# Patient Record
Sex: Male | Born: 1965
Health system: Southern US, Community
[De-identification: ages and names within clinical notes are randomized; demographics above are authoritative.]

## PROBLEM LIST (undated history)

## (undated) ENCOUNTER — Ambulatory Visit: Admission: EM | Payer: Self-pay | Source: Home / Self Care

## (undated) DIAGNOSIS — E119 Type 2 diabetes mellitus without complications: Secondary | ICD-10-CM

## (undated) DIAGNOSIS — E7800 Pure hypercholesterolemia, unspecified: Secondary | ICD-10-CM

## (undated) DIAGNOSIS — H60391 Other infective otitis externa, right ear: Secondary | ICD-10-CM

## (undated) DIAGNOSIS — N186 End stage renal disease: Secondary | ICD-10-CM

## (undated) DIAGNOSIS — R110 Nausea: Secondary | ICD-10-CM

## (undated) DIAGNOSIS — R197 Diarrhea, unspecified: Secondary | ICD-10-CM

## (undated) DIAGNOSIS — I10 Essential (primary) hypertension: Secondary | ICD-10-CM

## (undated) DIAGNOSIS — Z992 Dependence on renal dialysis: Secondary | ICD-10-CM

## (undated) DIAGNOSIS — D649 Anemia, unspecified: Secondary | ICD-10-CM

## (undated) DIAGNOSIS — E11319 Type 2 diabetes mellitus with unspecified diabetic retinopathy without macular edema: Secondary | ICD-10-CM

## (undated) HISTORY — DX: Type 2 diabetes mellitus without complications: E11.9

## (undated) HISTORY — DX: End stage renal disease: N18.6

## (undated) HISTORY — DX: Other infective otitis externa, right ear: H60.391

## (undated) HISTORY — PX: UMBILICAL HERNIA REPAIR: SHX196

---

## 2007-05-15 ENCOUNTER — Emergency Department (HOSPITAL_COMMUNITY): Admission: EM | Admit: 2007-05-15 | Discharge: 2007-05-16 | Payer: Self-pay | Admitting: Emergency Medicine

## 2011-04-28 LAB — I-STAT 8, (EC8 V) (CONVERTED LAB)
Bicarbonate: 30.5 — ABNORMAL HIGH
HCT: 54 — ABNORMAL HIGH
Hemoglobin: 18.4 — ABNORMAL HIGH
Operator id: 196461
Sodium: 134 — ABNORMAL LOW
pCO2, Ven: 47.1

## 2011-04-28 LAB — CBC
HCT: 51.1
MCHC: 35.1
RDW: 12.5
WBC: 8

## 2011-04-28 LAB — URINALYSIS, ROUTINE W REFLEX MICROSCOPIC
Bilirubin Urine: NEGATIVE
Glucose, UA: >1000 — AB
Hgb urine dipstick: NEGATIVE
Ketones, ur: NEGATIVE
Leukocytes, UA: NEGATIVE
Nitrite: NEGATIVE
Protein, ur: 30 — AB
Specific Gravity, Urine: 1.045 — ABNORMAL HIGH
pH: 6

## 2011-04-28 LAB — POCT I-STAT CREATININE
Creatinine, Ser: 0.7
Operator id: 196461

## 2011-04-28 LAB — DIFFERENTIAL
Basophils Absolute: 0
Basophils Relative: 1
Eosinophils Absolute: 0.3
Eosinophils Relative: 4
Lymphocytes Relative: 32
Monocytes Relative: 7
Neutrophils Relative %: 57

## 2011-04-28 LAB — URINE MICROSCOPIC-ADD ON

## 2015-08-03 ENCOUNTER — Inpatient Hospital Stay (HOSPITAL_COMMUNITY)
Admission: EM | Admit: 2015-08-03 | Discharge: 2015-08-06 | DRG: 305 | Disposition: A | Discharge status: Home / Self Care | Payer: Self-pay | Attending: Internal Medicine | Admitting: Internal Medicine

## 2015-08-03 ENCOUNTER — Emergency Department (HOSPITAL_COMMUNITY): Payer: Self-pay

## 2015-08-03 ENCOUNTER — Other Ambulatory Visit: Payer: Self-pay

## 2015-08-03 ENCOUNTER — Encounter (HOSPITAL_COMMUNITY): Payer: Self-pay | Admitting: Emergency Medicine

## 2015-08-03 DIAGNOSIS — Z79899 Other long term (current) drug therapy: Secondary | ICD-10-CM

## 2015-08-03 DIAGNOSIS — D72829 Elevated white blood cell count, unspecified: Secondary | ICD-10-CM | POA: Diagnosis present

## 2015-08-03 DIAGNOSIS — J40 Bronchitis, not specified as acute or chronic: Secondary | ICD-10-CM | POA: Diagnosis present

## 2015-08-03 DIAGNOSIS — E1169 Type 2 diabetes mellitus with other specified complication: Secondary | ICD-10-CM | POA: Diagnosis present

## 2015-08-03 DIAGNOSIS — R079 Chest pain, unspecified: Secondary | ICD-10-CM | POA: Diagnosis present

## 2015-08-03 DIAGNOSIS — I159 Secondary hypertension, unspecified: Secondary | ICD-10-CM

## 2015-08-03 DIAGNOSIS — I129 Hypertensive chronic kidney disease with stage 1 through stage 4 chronic kidney disease, or unspecified chronic kidney disease: Secondary | ICD-10-CM | POA: Diagnosis present

## 2015-08-03 DIAGNOSIS — N189 Chronic kidney disease, unspecified: Secondary | ICD-10-CM

## 2015-08-03 DIAGNOSIS — E11319 Type 2 diabetes mellitus with unspecified diabetic retinopathy without macular edema: Secondary | ICD-10-CM | POA: Diagnosis present

## 2015-08-03 DIAGNOSIS — E872 Acidosis: Secondary | ICD-10-CM | POA: Diagnosis present

## 2015-08-03 DIAGNOSIS — N179 Acute kidney failure, unspecified: Secondary | ICD-10-CM | POA: Diagnosis present

## 2015-08-03 DIAGNOSIS — N184 Chronic kidney disease, stage 4 (severe): Secondary | ICD-10-CM | POA: Diagnosis present

## 2015-08-03 DIAGNOSIS — I160 Hypertensive urgency: Principal | ICD-10-CM | POA: Diagnosis present

## 2015-08-03 DIAGNOSIS — J069 Acute upper respiratory infection, unspecified: Secondary | ICD-10-CM

## 2015-08-03 DIAGNOSIS — I248 Other forms of acute ischemic heart disease: Secondary | ICD-10-CM | POA: Diagnosis present

## 2015-08-03 DIAGNOSIS — I10 Essential (primary) hypertension: Secondary | ICD-10-CM | POA: Diagnosis present

## 2015-08-03 DIAGNOSIS — E7800 Pure hypercholesterolemia, unspecified: Secondary | ICD-10-CM | POA: Diagnosis present

## 2015-08-03 DIAGNOSIS — E785 Hyperlipidemia, unspecified: Secondary | ICD-10-CM | POA: Diagnosis present

## 2015-08-03 DIAGNOSIS — E118 Type 2 diabetes mellitus with unspecified complications: Secondary | ICD-10-CM

## 2015-08-03 DIAGNOSIS — I82401 Acute embolism and thrombosis of unspecified deep veins of right lower extremity: Secondary | ICD-10-CM

## 2015-08-03 DIAGNOSIS — N289 Disorder of kidney and ureter, unspecified: Secondary | ICD-10-CM

## 2015-08-03 DIAGNOSIS — E1165 Type 2 diabetes mellitus with hyperglycemia: Secondary | ICD-10-CM | POA: Diagnosis present

## 2015-08-03 DIAGNOSIS — E1122 Type 2 diabetes mellitus with diabetic chronic kidney disease: Secondary | ICD-10-CM | POA: Diagnosis present

## 2015-08-03 DIAGNOSIS — Z833 Family history of diabetes mellitus: Secondary | ICD-10-CM

## 2015-08-03 DIAGNOSIS — Z91018 Allergy to other foods: Secondary | ICD-10-CM

## 2015-08-03 DIAGNOSIS — Z8249 Family history of ischemic heart disease and other diseases of the circulatory system: Secondary | ICD-10-CM

## 2015-08-03 HISTORY — DX: Essential (primary) hypertension: I10

## 2015-08-03 HISTORY — DX: Pure hypercholesterolemia, unspecified: E78.00

## 2015-08-03 LAB — BASIC METABOLIC PANEL
Anion gap: 9 (ref 5–15)
BUN: 27 mg/dL — AB (ref 6–20)
CHLORIDE: 106 mmol/L (ref 101–111)
CO2: 24 mmol/L (ref 22–32)
CREATININE: 3.27 mg/dL — AB (ref 0.61–1.24)
Calcium: 9 mg/dL (ref 8.9–10.3)
GFR calc Af Amer: 24 mL/min — ABNORMAL LOW (ref >60)
GFR calc non Af Amer: 21 mL/min — ABNORMAL LOW (ref >60)
GLUCOSE: 114 mg/dL — AB (ref 65–99)
Potassium: 4.6 mmol/L (ref 3.5–5.1)
Sodium: 139 mmol/L (ref 135–145)

## 2015-08-03 LAB — URINALYSIS, ROUTINE W REFLEX MICROSCOPIC
BILIRUBIN URINE: NEGATIVE
GLUCOSE, UA: 250 mg/dL — AB
KETONES UR: NEGATIVE mg/dL
Leukocytes, UA: NEGATIVE
Nitrite: NEGATIVE
Specific Gravity, Urine: 1.02 (ref 1.005–1.030)
pH: 6 (ref 5.0–8.0)

## 2015-08-03 LAB — CBC
HEMATOCRIT: 33.4 % — AB (ref 39.0–52.0)
HEMOGLOBIN: 11.8 g/dL — AB (ref 13.0–17.0)
MCH: 29.3 pg (ref 26.0–34.0)
MCHC: 35.3 g/dL (ref 30.0–36.0)
MCV: 82.9 fL (ref 78.0–100.0)
Platelets: 170 10*3/uL (ref 150–400)
RBC: 4.03 MIL/uL — ABNORMAL LOW (ref 4.22–5.81)
RDW: 12.5 % (ref 11.5–15.5)
WBC: 14.1 10*3/uL — ABNORMAL HIGH (ref 4.0–10.5)

## 2015-08-03 LAB — CBG MONITORING, ED: Glucose-Capillary: 100 mg/dL — ABNORMAL HIGH (ref 65–99)

## 2015-08-03 LAB — I-STAT CG4 LACTIC ACID, ED
LACTIC ACID, VENOUS: 0.98 mmol/L (ref 0.5–2.0)
LACTIC ACID, VENOUS: 1.02 mmol/L (ref 0.5–2.0)

## 2015-08-03 LAB — URINE MICROSCOPIC-ADD ON

## 2015-08-03 LAB — I-STAT TROPONIN, ED: Troponin i, poc: 0.01 ng/mL (ref 0.00–0.08)

## 2015-08-03 MED ORDER — PIPERACILLIN-TAZOBACTAM 3.375 G IVPB 30 MIN
3.3750 g | Freq: Once | INTRAVENOUS | Status: AC
  Administered 2015-08-03: 3.375 g via INTRAVENOUS
  Filled 2015-08-03: qty 50

## 2015-08-03 MED ORDER — VANCOMYCIN HCL IN DEXTROSE 750-5 MG/150ML-% IV SOLN
750.0000 mg | Freq: Once | INTRAVENOUS | Status: AC
  Administered 2015-08-03: 750 mg via INTRAVENOUS
  Filled 2015-08-03: qty 150

## 2015-08-03 MED ORDER — LABETALOL HCL 5 MG/ML IV SOLN
10.0000 mg | Freq: Once | INTRAVENOUS | Status: AC
  Administered 2015-08-03: 10 mg via INTRAVENOUS
  Filled 2015-08-03: qty 4

## 2015-08-03 MED ORDER — PIPERACILLIN-TAZOBACTAM 3.375 G IVPB
3.3750 g | Freq: Three times a day (TID) | INTRAVENOUS | Status: DC
  Administered 2015-08-04 (×2): 3.375 g via INTRAVENOUS
  Filled 2015-08-03 (×4): qty 50

## 2015-08-03 MED ORDER — LORAZEPAM 2 MG/ML IJ SOLN
0.5000 mg | Freq: Once | INTRAMUSCULAR | Status: AC
  Administered 2015-08-03: 0.5 mg via INTRAVENOUS
  Filled 2015-08-03: qty 1

## 2015-08-03 MED ORDER — SODIUM CHLORIDE 0.9 % IV BOLUS (SEPSIS)
1000.0000 mL | Freq: Once | INTRAVENOUS | Status: AC
  Administered 2015-08-03: 1000 mL via INTRAVENOUS

## 2015-08-03 MED ORDER — VANCOMYCIN HCL IN DEXTROSE 1-5 GM/200ML-% IV SOLN
1000.0000 mg | Freq: Once | INTRAVENOUS | Status: AC
  Administered 2015-08-03: 1000 mg via INTRAVENOUS
  Filled 2015-08-03: qty 200

## 2015-08-03 MED ORDER — ACETAMINOPHEN 500 MG PO TABS
1000.0000 mg | ORAL_TABLET | Freq: Once | ORAL | Status: AC
  Administered 2015-08-03: 1000 mg via ORAL
  Filled 2015-08-03: qty 2

## 2015-08-03 MED ORDER — IBUPROFEN 200 MG PO TABS
ORAL_TABLET | ORAL | Status: AC
  Administered 2015-08-03: 20:00:00
  Filled 2015-08-03: qty 3

## 2015-08-03 MED ORDER — IBUPROFEN 400 MG PO TABS
600.0000 mg | ORAL_TABLET | Freq: Once | ORAL | Status: AC
  Administered 2015-08-03: 600 mg via ORAL

## 2015-08-03 NOTE — Progress Notes (Signed)
ANTIBIOTIC CONSULT NOTE - INITIAL  Pharmacy Consult for Vancomycin and Zosyn Indication: rule out sepsis  Allergies  Allergen Reactions  . Pork-Derived Products Rash and Other (See Comments)    Raises blood pressure    Patient Measurements:   Adjusted Body Weight:   Vital Signs: Temp: 100.9 F (38.3 C) (01/15 2000) Temp Source: Oral (01/15 2000) BP: 218/122 mmHg (01/15 1945) Pulse Rate: 114 (01/15 1945) Intake/Output from previous day:   Intake/Output from this shift:    Labs:  Recent Labs  08/03/15 1639  WBC 14.1*  HGB 11.8*  PLT 170  CREATININE 3.27*   CrCl cannot be calculated (Unknown ideal weight.). No results for input(s): VANCOTROUGH, VANCOPEAK, VANCORANDOM, GENTTROUGH, GENTPEAK, GENTRANDOM, TOBRATROUGH, TOBRAPEAK, TOBRARND, AMIKACINPEAK, AMIKACINTROU, AMIKACIN in the last 72 hours.   Microbiology: No results found for this or any previous visit (from the past 720 hour(s)).  Medical History: Past Medical History  Diagnosis Date  . Diabetes mellitus without complication ([place])   . Hypertension   . High cholesterol     Medications:   (Not in a hospital admission) Scheduled:   Infusions:  . piperacillin-tazobactam    . sodium chloride    . sodium chloride    . vancomycin     Assessment: 50yo male presents with HA, chills, SOB, nasal congestion and cough. Pharmacy is consulted to dose vancomycin and zosyn for suspected sepsis. Pt is febrile to 100.9, WBC 14.1, sCr 3.27.  Goal of Therapy:  Vancomycin trough level 15-20 mcg/ml  Plan:  Vancomycin 1g IV once in the ED Zosyn 3.375g IV once in the ED Measure antibiotic drug levels at steady state Follow up culture results, renal function and clinical course  Eric Glenn. [name], PharmD, [facility] Clinical Pharmacist Pager [phone] 08/03/2015,8:14 PM

## 2015-08-03 NOTE — Progress Notes (Signed)
ANTIBIOTIC CONSULT NOTE   Pharmacy Consult for Vancomycin and Zosyn Indication: rule out sepsis  Allergies  Allergen Reactions  . Pork-Derived Products Rash and Other (See Comments)    Raises blood pressure    Patient Measurements: Weight: 202 lb 8 oz (91.853 kg) Adjusted Body Weight:   Vital Signs: Temp: 100.9 F (38.3 C) (01/15 2000) Temp Source: Oral (01/15 2000) BP: 218/122 mmHg (01/15 1945) Pulse Rate: 114 (01/15 1945) Intake/Output from previous day:   Intake/Output from this shift:    Labs:  Recent Labs  08/03/15 1639  WBC 14.1*  HGB 11.8*  PLT 170  CREATININE 3.27*   CrCl cannot be calculated (Unknown ideal weight.). No results for input(s): VANCOTROUGH, VANCOPEAK, VANCORANDOM, GENTTROUGH, GENTPEAK, GENTRANDOM, TOBRATROUGH, TOBRAPEAK, TOBRARND, AMIKACINPEAK, AMIKACINTROU, AMIKACIN in the last 72 hours.   Microbiology: No results found for this or any previous visit (from the past 720 hour(s)).  Medical History: Past Medical History  Diagnosis Date  . Diabetes mellitus without complication ([place])   . Hypertension   . High cholesterol     Medications:   (Not in a hospital admission) Scheduled:  . labetalol  10 mg Intravenous Once  . LORazepam  0.5 mg Intravenous Once   Infusions:  . piperacillin-tazobactam    . sodium chloride 1,000 mL (08/03/15 2032)  . sodium chloride 1,000 mL (08/03/15 2031)  . vancomycin      Assessment: 50yo male presents with HA, chills, SOB, nasal congestion and cough. Pharmacy is consulted to dose vancomycin and zosyn for suspected sepsis. Pt is febrile to 100.9, WBC 14.1, sCr 3.27 (Last SCr was 0.7 on 2008) . Estimated CrCl ~ 30 but may not be reliable.Annell Greening noted 91.9kg  1/15 zosyn 1/15 vanc  1/15 urine 1/15 blood x2  Goal of Therapy:  Vancomycin trough level 15-20 mcg/ml  Plan:  -Vancomycin 750mg  IV (for total on 1750mg ) -Will follow BMET in am for vancomycin maintenance dosing -Zosyn 3.375gm IV  q8h -Will follow renal function, cultures and clinical progress  Hildred Laser, Pharm D 08/03/2015 8:41 PM

## 2015-08-03 NOTE — ED Provider Notes (Signed)
CSN: [id number]     Arrival date & time 08/03/15  1551 History   First MD Initiated Contact with Patient 08/03/15 2010     Chief Complaint  Patient presents with  . Shortness of Breath  . Chills  . Cough  . Nasal Congestion     (Consider location/radiation/quality/duration/timing/severity/associated sxs/prior Treatment) HPI  Spanish speaking only, interview done with an interpretor Patient to the ER with complaints of nasal congestion, cough, chills, and cold symptoms for 5 days. He says that today he developed worsening symptoms and chills and also becomes fatigue and SOB with walking. He has a bilateral temporal headache that is 3/10 and throbbing. In triage he is hypertensive at 232/117, temp of 100.3, tachycardic at 107. O2 is 98 % on room air. He has a PMH of diabetes, hypertension and high cholesterol. He takes Enalapril and Metformin but is not compliant with his medications. He also is complaining of some tightness in his chest earlier but has since improved.+ nausea  He has not had orthopnea, lower extremity swelling, wheezing, back pain, rash, neck pain, confusion, change in vision, focal weakness, vomiting, diarrhea. Denies abnormal bleeding or change in bowel habits or urination.      Past Medical History  Diagnosis Date  . Diabetes mellitus without complication ([place])   . Hypertension   . High cholesterol    History reviewed. No pertinent past surgical history. No family history on file. Social History  Substance Use Topics  . Smoking status: Never Smoker   . Smokeless tobacco: None  . Alcohol Use: No    Review of Systems  Review of Systems All other systems negative except as documented in the HPI. All pertinent positives and negatives as reviewed in the HPI.   Allergies  Pork-derived products  Home Medications   Prior to Admission medications   Medication Sig Start Date End Date Taking? Authorizing Provider  Multiple Vitamin (MULTIVITAMIN WITH MINERALS)  TABS tablet Take 1 tablet by mouth daily.   Yes Historical Provider, MD  OVER THE COUNTER MEDICATION Place 1 spray into both nostrils daily as needed (congestion). Over the counter nasal spray   Yes Historical Provider, MD  PRESCRIPTION MEDICATION Take 1 tablet by mouth daily. Medication for blood sugar from [place]   Yes Historical Provider, MD  PRESCRIPTION MEDICATION Take 1 tablet by mouth daily. Medication for blood pressure from [place]   Yes Historical Provider, MD   BP 162/90 mmHg  Pulse 95  Temp(Src) 100.9 F (38.3 C) (Oral)  Resp 17  Wt 91.853 kg  SpO2 99% Physical Exam  Constitutional: He is oriented to person, place, and time. He appears well-developed and well-nourished. He appears ill. No distress.  HENT:  Head: Normocephalic and atraumatic.  Right Ear: Tympanic membrane and ear canal normal.  Left Ear: Tympanic membrane and ear canal normal.  Nose: Rhinorrhea: nasal congestion. Right sinus exhibits no frontal sinus tenderness. Left sinus exhibits no frontal sinus tenderness.  Mouth/Throat: Uvula is midline, oropharynx is clear and moist and mucous membranes are normal. No oropharyngeal exudate, posterior oropharyngeal edema or posterior oropharyngeal erythema.  Eyes: EOM and lids are normal. Pupils are equal, round, and reactive to light. Right conjunctiva is injected. Left conjunctiva is injected.  + pterygium bilaterally  Neck: Trachea normal and normal range of motion. Neck supple. No spinous process tenderness and no muscular tenderness present. No Brudzinski's sign and no Kernig's sign noted.  Cardiovascular: Regular rhythm.  Tachycardia present.   Pulmonary/Chest: Effort normal. No accessory muscle  usage. No respiratory distress. He has decreased breath sounds. He has no rhonchi.  Abdominal: Soft. Bowel sounds are normal. There is no tenderness. There is no rebound and no guarding.  No signs of abdominal distention  Musculoskeletal:  No LE swelling  Neurological: He is  alert and oriented to person, place, and time.  Acting at baseline  Skin: Skin is warm and dry. No rash noted.  Psychiatric: His mood appears anxious.  Nursing note and vitals reviewed.   ED Course  Procedures (including critical care time) Labs Review Labs Reviewed  BASIC METABOLIC PANEL - Abnormal; Notable for the following:    Glucose, Bld 114 (*)    BUN 27 (*)    Creatinine, Ser 3.27 (*)    GFR calc non Af Amer 21 (*)    GFR calc Af Amer 24 (*)    All other components within normal limits  CBC - Abnormal; Notable for the following:    WBC 14.1 (*)    RBC 4.03 (*)    Hemoglobin 11.8 (*)    HCT 33.4 (*)    All other components within normal limits  URINALYSIS, ROUTINE W REFLEX MICROSCOPIC (NOT AT [facility]) - Abnormal; Notable for the following:    Glucose, UA 250 (*)    Hgb urine dipstick MODERATE (*)    Protein, ur >300 (*)    All other components within normal limits  URINE MICROSCOPIC-ADD ON - Abnormal; Notable for the following:    Squamous Epithelial / LPF 0-5 (*)    Bacteria, UA RARE (*)    Casts HYALINE CASTS (*)    All other components within normal limits  CBG MONITORING, ED - Abnormal; Notable for the following:    Glucose-Capillary 100 (*)    All other components within normal limits  CULTURE, BLOOD (ROUTINE X 2)  CULTURE, BLOOD (ROUTINE X 2)  URINE CULTURE  BASIC METABOLIC PANEL  I-STAT TROPOININ, ED  I-STAT CG4 LACTIC ACID, ED  I-STAT CG4 LACTIC ACID, ED    Imaging Review Dg Chest 2 View  08/03/2015  CLINICAL DATA:  Chest pain with nasal congestion and cough for 3 weeks EXAM: CHEST  2 VIEW COMPARISON:  May 15, 2007 FINDINGS: There is no edema or consolidation. The heart size and pulmonary vascularity are normal. No adenopathy. No bone lesions. No pneumothorax. IMPRESSION: No edema or consolidation. Electronically Signed   By: Lowella Grip III M.D.   On: 08/03/2015 17:47   I have personally reviewed and evaluated these images and lab results as  part of my medical decision-making.   EKG Interpretation None      MDM   Final diagnoses:  Secondary hypertension, unspecified  Acute renal insufficiency  Upper respiratory infection   CRITICAL CARE Performed by: Linus Mako Total critical care time: 35 minutes Critical care time was exclusive of separately billable procedures and treating other patients. Critical care was necessary to treat or prevent imminent or life-threatening deterioration. Critical care was time spent personally by me on the following activities: development of treatment plan with patient and/or surrogate as well as nursing, discussions with consultants, evaluation of patient's response to treatment, examination of patient, obtaining history from patient or surrogate, ordering and performing treatments and interventions, ordering and review of laboratory studies, ordering and review of radiographic studies, pulse oximetry and re-evaluation of patient's condition.  Code Sepsis Called at 8:09 pm  Unclear patient presentation. He presents with temperature 101.4, tachycardia at 120, blood pressure 230/120 with 100% pulse ox on  room air. He complains of headache, negative head CT Positive chest pain cough and cold symptoms, negative troponin, nonacute EKG and chest x-ray unremarkable. He has a negative lactic acid.  At this point I feel like the patient is not septic. He was given IV labetalol which improved his blood pressure 160/90, his headache and chest pressure went away not long after the medicine per the patient. Since he has not had a medical evaluate in the past 10 years and is non compliant it is unclear if this is an acute elevation of his BP or acute kidney injury  He is currently stable and will be admitted to [facility], inpatient, [facility] Admits, Dr. Olevia Bowens.    Delos Haring, PA-C 08/03/15 [id number]  Gareth Morgan, MD 08/04/15 1451

## 2015-08-03 NOTE — ED Notes (Signed)
C/o headache, chills, sob, nasal congestion, and non-productive cough x 3 weeks.  Reports soreness across chest x 2 hours.

## 2015-08-04 ENCOUNTER — Inpatient Hospital Stay (HOSPITAL_COMMUNITY): Payer: Self-pay

## 2015-08-04 ENCOUNTER — Ambulatory Visit (HOSPITAL_COMMUNITY): Payer: Self-pay

## 2015-08-04 ENCOUNTER — Other Ambulatory Visit: Payer: Self-pay

## 2015-08-04 ENCOUNTER — Encounter (HOSPITAL_COMMUNITY): Payer: Self-pay | Admitting: Internal Medicine

## 2015-08-04 DIAGNOSIS — E118 Type 2 diabetes mellitus with unspecified complications: Secondary | ICD-10-CM

## 2015-08-04 DIAGNOSIS — E1165 Type 2 diabetes mellitus with hyperglycemia: Secondary | ICD-10-CM | POA: Diagnosis present

## 2015-08-04 DIAGNOSIS — E1169 Type 2 diabetes mellitus with other specified complication: Secondary | ICD-10-CM | POA: Diagnosis present

## 2015-08-04 DIAGNOSIS — I10 Essential (primary) hypertension: Secondary | ICD-10-CM | POA: Diagnosis present

## 2015-08-04 DIAGNOSIS — I82401 Acute embolism and thrombosis of unspecified deep veins of right lower extremity: Secondary | ICD-10-CM

## 2015-08-04 DIAGNOSIS — R079 Chest pain, unspecified: Secondary | ICD-10-CM

## 2015-08-04 DIAGNOSIS — E119 Type 2 diabetes mellitus without complications: Secondary | ICD-10-CM

## 2015-08-04 DIAGNOSIS — D72829 Elevated white blood cell count, unspecified: Secondary | ICD-10-CM | POA: Diagnosis present

## 2015-08-04 DIAGNOSIS — E11319 Type 2 diabetes mellitus with unspecified diabetic retinopathy without macular edema: Secondary | ICD-10-CM | POA: Diagnosis present

## 2015-08-04 HISTORY — DX: Type 2 diabetes mellitus without complications: E11.9

## 2015-08-04 LAB — BASIC METABOLIC PANEL
Anion gap: 6 (ref 5–15)
BUN: 26 mg/dL — ABNORMAL HIGH (ref 6–20)
CALCIUM: 7.7 mg/dL — AB (ref 8.9–10.3)
CO2: 20 mmol/L — ABNORMAL LOW (ref 22–32)
CREATININE: 3.29 mg/dL — AB (ref 0.61–1.24)
Chloride: 112 mmol/L — ABNORMAL HIGH (ref 101–111)
GFR calc non Af Amer: 20 mL/min — ABNORMAL LOW (ref >60)
GFR, EST AFRICAN AMERICAN: 24 mL/min — AB (ref >60)
Glucose, Bld: 118 mg/dL — ABNORMAL HIGH (ref 65–99)
Potassium: 4.5 mmol/L (ref 3.5–5.1)
SODIUM: 138 mmol/L (ref 135–145)

## 2015-08-04 LAB — GLUCOSE, CAPILLARY
GLUCOSE-CAPILLARY: 115 mg/dL — AB (ref 65–99)
GLUCOSE-CAPILLARY: 144 mg/dL — AB (ref 65–99)
Glucose-Capillary: 120 mg/dL — ABNORMAL HIGH (ref 65–99)
Glucose-Capillary: 135 mg/dL — ABNORMAL HIGH (ref 65–99)

## 2015-08-04 LAB — COMPREHENSIVE METABOLIC PANEL
ALBUMIN: 2.3 g/dL — AB (ref 3.5–5.0)
ALK PHOS: 65 U/L (ref 38–126)
ALT: 20 U/L (ref 17–63)
AST: 22 U/L (ref 15–41)
Anion gap: 7 (ref 5–15)
BILIRUBIN TOTAL: 0.7 mg/dL (ref 0.3–1.2)
BUN: 27 mg/dL — AB (ref 6–20)
CO2: 20 mmol/L — ABNORMAL LOW (ref 22–32)
Calcium: 8.1 mg/dL — ABNORMAL LOW (ref 8.9–10.3)
Chloride: 110 mmol/L (ref 101–111)
Creatinine, Ser: 3.24 mg/dL — ABNORMAL HIGH (ref 0.61–1.24)
GFR calc Af Amer: 24 mL/min — ABNORMAL LOW (ref >60)
GFR calc non Af Amer: 21 mL/min — ABNORMAL LOW (ref >60)
GLUCOSE: 142 mg/dL — AB (ref 65–99)
POTASSIUM: 4.5 mmol/L (ref 3.5–5.1)
Sodium: 137 mmol/L (ref 135–145)
TOTAL PROTEIN: 5.1 g/dL — AB (ref 6.5–8.1)

## 2015-08-04 LAB — LIPID PANEL
CHOLESTEROL: 162 mg/dL (ref 0–200)
HDL: 25 mg/dL — ABNORMAL LOW (ref >40)
LDL Cholesterol: UNDETERMINED mg/dL (ref 0–99)
TRIGLYCERIDES: 414 mg/dL — AB (ref <150)
Total CHOL/HDL Ratio: 6.5 RATIO
VLDL: UNDETERMINED mg/dL (ref 0–40)

## 2015-08-04 LAB — URINE CULTURE

## 2015-08-04 LAB — POTASSIUM, URINE RANDOM: Potassium Urine: 36 mmol/L

## 2015-08-04 LAB — VANCOMYCIN, TROUGH: Vancomycin Tr: 17 ug/mL (ref 10.0–20.0)

## 2015-08-04 LAB — TROPONIN I
Troponin I: 0.1 ng/mL — ABNORMAL HIGH (ref <0.031)
Troponin I: 0.11 ng/mL — ABNORMAL HIGH (ref <0.031)
Troponin I: 0.1 ng/mL — ABNORMAL HIGH (ref <0.031)

## 2015-08-04 LAB — CREATININE, URINE, RANDOM: CREATININE, URINE: 56.52 mg/dL

## 2015-08-04 LAB — INFLUENZA PANEL BY PCR (TYPE A & B)
H1N1FLUPCR: NOT DETECTED
INFLAPCR: NEGATIVE
Influenza B By PCR: NEGATIVE

## 2015-08-04 LAB — MAGNESIUM: Magnesium: 1.7 mg/dL (ref 1.7–2.4)

## 2015-08-04 LAB — PHOSPHORUS: Phosphorus: 4.3 mg/dL (ref 2.5–4.6)

## 2015-08-04 LAB — SODIUM, URINE, RANDOM: Sodium, Ur: 105 mmol/L

## 2015-08-04 LAB — PROTEIN, URINE, RANDOM: Total Protein, Urine: 526 mg/dL

## 2015-08-04 MED ORDER — AMLODIPINE BESYLATE 5 MG PO TABS
5.0000 mg | ORAL_TABLET | Freq: Every day | ORAL | Status: DC
  Administered 2015-08-04: 5 mg via ORAL
  Filled 2015-08-04: qty 1

## 2015-08-04 MED ORDER — AMOXICILLIN-POT CLAVULANATE 875-125 MG PO TABS
1.0000 | ORAL_TABLET | Freq: Two times a day (BID) | ORAL | Status: DC
  Administered 2015-08-04: 1 via ORAL
  Filled 2015-08-04: qty 1

## 2015-08-04 MED ORDER — FLUTICASONE PROPIONATE 50 MCG/ACT NA SUSP
1.0000 | Freq: Every day | NASAL | Status: DC
  Administered 2015-08-04 – 2015-08-06 (×3): 1 via NASAL
  Filled 2015-08-04: qty 16

## 2015-08-04 MED ORDER — AMLODIPINE BESYLATE 10 MG PO TABS
10.0000 mg | ORAL_TABLET | Freq: Every day | ORAL | Status: DC
  Administered 2015-08-05 – 2015-08-06 (×2): 10 mg via ORAL
  Filled 2015-08-04 (×2): qty 1

## 2015-08-04 MED ORDER — ACETAMINOPHEN 325 MG PO TABS
650.0000 mg | ORAL_TABLET | Freq: Four times a day (QID) | ORAL | Status: DC | PRN
  Administered 2015-08-04 (×2): 650 mg via ORAL
  Filled 2015-08-04 (×2): qty 2

## 2015-08-04 MED ORDER — HYDRALAZINE HCL 20 MG/ML IJ SOLN
5.0000 mg | Freq: Once | INTRAMUSCULAR | Status: AC
  Administered 2015-08-04: 5 mg via INTRAVENOUS

## 2015-08-04 MED ORDER — ONDANSETRON HCL 4 MG/2ML IJ SOLN: 4.0000 mg | Freq: Four times a day (QID) | INTRAMUSCULAR | Status: DC | PRN

## 2015-08-04 MED ORDER — METOPROLOL TARTRATE 25 MG PO TABS
25.0000 mg | ORAL_TABLET | Freq: Two times a day (BID) | ORAL | Status: DC
  Administered 2015-08-04 (×2): 25 mg via ORAL
  Filled 2015-08-04 (×2): qty 1

## 2015-08-04 MED ORDER — SODIUM CHLORIDE 0.9 % IV SOLN
INTRAVENOUS | Status: DC
  Administered 2015-08-04: 17:00:00 via INTRAVENOUS
  Administered 2015-08-05: 1000 mL via INTRAVENOUS
  Administered 2015-08-05: 04:00:00 via INTRAVENOUS

## 2015-08-04 MED ORDER — INSULIN ASPART 100 UNIT/ML ~~LOC~~ SOLN
0.0000 [IU] | Freq: Three times a day (TID) | SUBCUTANEOUS | Status: DC
  Administered 2015-08-05 (×3): 1 [IU] via SUBCUTANEOUS

## 2015-08-04 MED ORDER — AMOXICILLIN-POT CLAVULANATE 500-125 MG PO TABS
1.0000 | ORAL_TABLET | Freq: Two times a day (BID) | ORAL | Status: DC
  Administered 2015-08-05 – 2015-08-06 (×3): 500 mg via ORAL
  Filled 2015-08-04 (×4): qty 1

## 2015-08-04 MED ORDER — AMLODIPINE BESYLATE 5 MG PO TABS
5.0000 mg | ORAL_TABLET | Freq: Once | ORAL | Status: AC
  Administered 2015-08-04: 5 mg via ORAL
  Filled 2015-08-04: qty 1

## 2015-08-04 MED ORDER — PNEUMOCOCCAL VAC POLYVALENT 25 MCG/0.5ML IJ INJ
0.5000 mL | INJECTION | INTRAMUSCULAR | Status: AC
  Administered 2015-08-05: 0.5 mL via INTRAMUSCULAR
  Filled 2015-08-04: qty 0.5

## 2015-08-04 MED ORDER — INFLUENZA VAC SPLIT QUAD 0.5 ML IM SUSY
0.5000 mL | PREFILLED_SYRINGE | INTRAMUSCULAR | Status: AC
  Administered 2015-08-05: 0.5 mL via INTRAMUSCULAR
  Filled 2015-08-04: qty 0.5

## 2015-08-04 MED ORDER — HYDRALAZINE HCL 20 MG/ML IJ SOLN
INTRAMUSCULAR | Status: AC
  Administered 2015-08-04: 5 mg via INTRAVENOUS
  Filled 2015-08-04: qty 1

## 2015-08-04 MED ORDER — HYDRALAZINE HCL 20 MG/ML IJ SOLN
5.0000 mg | INTRAMUSCULAR | Status: DC | PRN
  Administered 2015-08-04 (×2): 5 mg via INTRAVENOUS
  Filled 2015-08-04 (×2): qty 1

## 2015-08-04 MED ORDER — SODIUM CHLORIDE 0.9 % IJ SOLN
3.0000 mL | Freq: Two times a day (BID) | INTRAMUSCULAR | Status: DC
  Administered 2015-08-05: 3 mL via INTRAVENOUS

## 2015-08-04 NOTE — ED Notes (Signed)
Attempted report X1

## 2015-08-04 NOTE — Progress Notes (Addendum)
Pt arrived to 3W06 with family at bedside.  Pt c/o headache with BP 204/100. MD paged for further orders.  Awaiting return call.

## 2015-08-04 NOTE — H&P (Signed)
[facility] History and Physical  Eric Glenn [id number] DOB: 12-28-1965 DOA: 08/03/2015  Referring physician: Delos Haring, PA-C PCP: No primary care provider on file.   Chief Complaint: Chest pain.  HPI: Eric Glenn is a 50 y.o. male  with a past medical history of type 2 diabetes, hypertension who comes to the emergency department due to chest pressure for 2 hours and headache, chills, dyspnea nasal congestion and nonproductive cough for the past 2 weeks.  The patient is not a very good historian and is assisted by his relatives while narrating his symptoms. Apparently about 2 weeks ago he started developing cold-like symptoms, including sinus headache, nasal congestion, nonproductive cough, chills and fatigue. He is states that he becomes dyspneic with ambulation, but today feels like his headache is worse and developed earlier chest pressure, not radiated, associated with dyspnea, mild dizziness, mild nausea, but no emesis.   He denies orthopnea PND lower extremities edema, but on exam has 2+ lower extremity edema. He is states that he has not visited a doctor in about 8 or 9 years and he gets his medications on the mail from [place] [facility] [facility] by his relatives. He has been following with ophthalmology for laser treatments for what seems to be diabetic retinopathy.  In the ED, the patient was found to have a blood pressure of 232/117 mmHg, heart rate of 107 bpm and a temperature of 100.15F. He has received IV antibiotics, labetalol and lorazepam. He states that he feels better.   Review of Systems:  Constitutional:  Positive Fevers, chills, fatigue.  No weight loss, night sweats. HEENT:  Positive headache, nasal congestion, sore throat, nonproductive cough. The patient has been using over-the-counter nasal decongestant for about 3 months. Cardio-vascular:  As above mentioned. GI:  Mild nausea, loss of appetite No heartburn, indigestion, abdominal  pain, vomiting, diarrhea, change in bowel habits,  Resp:  Positive dyspnea, nonproductive cough. Denies wheezing or hemoptysis.  Skin:  no rash or lesions.  GU:  no dysuria, change in color of urine, no urgency or frequency. No flank pain.  Musculoskeletal:  No joint pain or swelling. No decreased range of motion. No back pain.  Psych:  No change in mood or affect. No depression or anxiety. No memory loss.   Past Medical History  Diagnosis Date  . Diabetes mellitus without complication ([place])   . Hypertension   . High cholesterol    Past Surgical History  Procedure Laterality Date  . Umbilical hernia repair     Social History:  reports that he has never smoked. He does not have any smokeless tobacco history on file. He reports that he does not drink alcohol or use illicit drugs.  Allergies  Allergen Reactions  . Pork-Derived Products Rash and Other (See Comments)    Raises blood pressure    Family History  Problem Relation Age of Onset  . Hypertension Mother   . Hyperlipidemia Mother   . Diabetes Mellitus II Sister     Prior to Admission medications   Medication Sig Start Date End Date Taking? Authorizing Provider  Multiple Vitamin (MULTIVITAMIN WITH MINERALS) TABS tablet Take 1 tablet by mouth daily.   Yes Historical Provider, MD  OVER THE COUNTER MEDICATION Place 1 spray into both nostrils daily as needed (congestion). Over the counter nasal spray   Yes Historical Provider, MD  PRESCRIPTION MEDICATION Take 1 tablet by mouth daily. Medication for blood sugar from [place]   Yes Historical Provider, MD  PRESCRIPTION MEDICATION Take 1 tablet  by mouth daily. Medication for blood pressure from [place]   Yes Historical Provider, MD   Physical Exam: Filed Vitals:   08/03/15 2145 08/03/15 2200 08/03/15 2215 08/03/15 2315  BP: 170/102 152/98 139/75 162/90  Pulse: 102 99 95 95  Temp:      TempSrc:      Resp: 17 16 16 17   Weight:      SpO2: 98% 93% 93% 99%    Wt Readings  from Last 3 Encounters:  08/03/15 91.853 kg (202 lb 8 oz)    General:  Appears calm and comfortable Eyes: PERRL, normal lids, irises. Positive keratoconjunctivitis laterally ENT: grossly normal hearing, lips & tongue Neck: no LAD, masses or thyromegaly Cardiovascular: RRR, no m/r/g. 2+ LE edema. Telemetry: SR, no arrhythmias  Respiratory: Decreased breath sounds on bases bilaterally, no w/r/r.                       Decreased respiratory effort. No tachypnea. No accessory muscle use. Abdomen: Obese, soft, ntnd Skin: no rash or induration seen on limited exam Musculoskeletal: grossly normal tone BUE/BLE Psychiatric: grossly normal mood and affect, speech fluent and appropriate Neurologic: Awake, alert, oriented 3, grossly non-focal.          Labs on Admission:  Basic Metabolic Panel:  Recent Labs Lab 08/03/15 1639  NA 139  K 4.6  CL 106  CO2 24  GLUCOSE 114*  BUN 27*  CREATININE 3.27*  CALCIUM 9.0   CBC:  Recent Labs Lab 08/03/15 1639  WBC 14.1*  HGB 11.8*  HCT 33.4*  MCV 82.9  PLT 170    CBG:  Recent Labs Lab 08/03/15 2034  GLUCAP 100*    Radiological Exams on Admission: Dg Chest 2 View  08/03/2015  CLINICAL DATA:  Chest pain with nasal congestion and cough for 3 weeks EXAM: CHEST  2 VIEW COMPARISON:  May 15, 2007 FINDINGS: There is no edema or consolidation. The heart size and pulmonary vascularity are normal. No adenopathy. No bone lesions. No pneumothorax. IMPRESSION: No edema or consolidation. Electronically Signed   By: Lowella Grip III M.D.   On: 08/03/2015 17:47   Ct Head Wo Contrast  08/03/2015  CLINICAL DATA:  50 year old male with headaches and hypertension. EXAM: CT HEAD WITHOUT CONTRAST TECHNIQUE: Contiguous axial images were obtained from the base of the skull through the vertex without intravenous contrast. COMPARISON:  None. FINDINGS: The ventricles and sulci are appropriate in size for patient's age. Minimal periventricular and deep  white matter hypodensities represent chronic microvascular ischemic changes. There is no intracranial hemorrhage. No mass effect or midline shift identified. The visualized paranasal sinuses and mastoid air cells are well aerated. The calvarium is intact. IMPRESSION: No acute intracranial pathology. Electronically Signed   By: Anner Crete M.D.   On: 08/03/2015 23:23    EKG: Independently reviewed. Vent. rate 109 BPM PR interval 152 ms QRS duration 94 ms QT/QTc 336/452 ms P-R-T axes 53 -20 14 Sinus tachycardia Possible Anterior infarct , age undetermined Abnormal ECG No previous tracing to compare to.  Assessment/Plan Principal Problem:   Chest pain Admit to telemetry. Trend cardiac biomarkers. Start metoprolol 25 mg by mouth twice a day. Abnormal EKG. Check echocardiogram to evaluate EF.  Active Problems:    Hypertensive urgency Start metoprolol 25 mg by mouth twice a day. Hold enalapril due to CKD and lack of knowledge of current dosage. Monitor blood pressure periodically and adjust antihypertensive therapy as needed.    Acute  versus chronic renal insufficiency Unknown for how long the patient has had chronic renal insufficiency,  since his last visit to the doctor and labs were in 2008.  Hold metformin. Check urine random electrolytes, creatinine and protein. Check renal ultrasound.     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    Proteinuria The patient states that he has been seeing occasionally foamy urine for some time now. He does not know if he has had proteinuria or not in the past.    Leukocytosis, tachycardia and low-grade fever Unknown source at this time, but it could be sinusitis. Continue IV antibiotics. Follow WBC.   Code Status: Full code. DVT Prophylaxis: SCDs. 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 Family Communication: Disposition Plan: Admit to telemetry, cardiac biomarkers trending, echo and further evaluation.  Time spent: Over 70 minutes were spent in the process of his admission.  Reubin Milan [facility] Pager [phone].

## 2015-08-04 NOTE — Clinical Documentation Improvement (Signed)
Internal Medicine    Documented Sepsis in H + P, not in current progress notes.  Was diagnosis ruled in or ruled out?  Thank you     Sepsis ruled in  Sepsis ruled out  Other  Clinically Undetermined    Please exercise your independent, professional judgment when responding. A specific answer is not anticipated or expected.   Thank You, [place] [phone]

## 2015-08-04 NOTE — Progress Notes (Signed)
VASCULAR LAB PRELIMINARY  PRELIMINARY  PRELIMINARY  PRELIMINARY   Bilateral- lower extremity venous Doppler has been completed. No DVT identified ,no superficial thrombus or no baker's cyst - Bilaterally  Twisha Vanpelt, RVT, RDMS 08/04/2015, 4:10 PM

## 2015-08-04 NOTE — Progress Notes (Addendum)
[facility] Brief Progress Note  Chart reviewed.  History and physical exam confirmed on my exam.  One addition is that he reports right leg swelling for the past 1-2 months with pain with ambulation.  This is worse with standing on his feet.  Bilateral pterygium, medial.    Chest pain improved, he reports that his main concern is his acute illness.  He notes that he has had sinus pain and pressure mainly on the left, cough, runny nose.  He has an elevated WBC.  BP has remained high despite metoprolol.  He was not aware of any renal function problems, but he has not seen any physician.   Plan  Chest pain Trend enzymes - 0.01 --> 0.10 --> 0.11; this rise has stabilized.  EKG from yesterday with TWI in AVF and III, V6.  Will repeat Continue to trend enzymes, rise could be explained by Creatinine and acute illness along with elevated BP Continue IVF  Accelerated HTN Start amlodipine 5mg  daily now, increase as needed PRN hydralazine for SBP > 180  Acute infection CXR not revealing, UA normal I do not think he needs such broad spectrum coverage, change to Augmentin for possible sinus infection.    AKI Urine studies pending Renal ultrasound without obstruction IVF with NS at 125cc/hr, decrease to 75cc/hr if he is eating well.   DM2 Glucose running in the 110s-140s Continue sliding scale for now Check A1C  Right lower extremity swelling Check ultrasound of the leg, concern for possible clot  Gilles Chiquito, MD [phone]

## 2015-08-04 NOTE — Progress Notes (Signed)
Pt given tylenol 650 mg for c/o headache and prn hydralazine 5 mg given per new MD orders.  Pt on droplet precautions and awaiting flu swab from lab.  Report given to Marya Amsler, Therapist, sports.

## 2015-08-04 NOTE — Clinical Documentation Improvement (Signed)
Internal Medicine  Can the diagnosis of CKD be further specified with the stage? Thank you    CKD Stage I - GFR greater than or equal to 90  CKD Stage II - GFR 60-89  CKD Stage III - GFR 30-59  CKD Stage IV - GFR 15-29  CKD Stage V - GFR < 15  Other condition  Unable to clinically determine   Supporting Information: : (risk factors, signs and symptoms, diagnostics, treatment) HTN urgency , AKI, DM 2  Please exercise your independent, professional judgment when responding. A specific answer is not anticipated or expected.   Thank You, [place] [phone]

## 2015-08-04 NOTE — Progress Notes (Signed)
  Echocardiogram 2D Echocardiogram has been performed.  Jennette Dubin 08/04/2015, 12:00 PM

## 2015-08-05 DIAGNOSIS — E785 Hyperlipidemia, unspecified: Secondary | ICD-10-CM

## 2015-08-05 DIAGNOSIS — I160 Hypertensive urgency: Principal | ICD-10-CM

## 2015-08-05 DIAGNOSIS — D72829 Elevated white blood cell count, unspecified: Secondary | ICD-10-CM

## 2015-08-05 LAB — RENAL FUNCTION PANEL
ANION GAP: 7 (ref 5–15)
Albumin: 2.2 g/dL — ABNORMAL LOW (ref 3.5–5.0)
BUN: 29 mg/dL — ABNORMAL HIGH (ref 6–20)
CHLORIDE: 109 mmol/L (ref 101–111)
CO2: 21 mmol/L — AB (ref 22–32)
Calcium: 8.1 mg/dL — ABNORMAL LOW (ref 8.9–10.3)
Creatinine, Ser: 3.52 mg/dL — ABNORMAL HIGH (ref 0.61–1.24)
GFR calc non Af Amer: 19 mL/min — ABNORMAL LOW (ref >60)
GFR, EST AFRICAN AMERICAN: 22 mL/min — AB (ref >60)
GLUCOSE: 155 mg/dL — AB (ref 65–99)
POTASSIUM: 4.4 mmol/L (ref 3.5–5.1)
Phosphorus: 5 mg/dL — ABNORMAL HIGH (ref 2.5–4.6)
SODIUM: 137 mmol/L (ref 135–145)

## 2015-08-05 LAB — CBC
HCT: 28 % — ABNORMAL LOW (ref 39.0–52.0)
Hemoglobin: 9.9 g/dL — ABNORMAL LOW (ref 13.0–17.0)
MCH: 29.7 pg (ref 26.0–34.0)
MCHC: 35.4 g/dL (ref 30.0–36.0)
MCV: 84.1 fL (ref 78.0–100.0)
PLATELETS: 143 10*3/uL — AB (ref 150–400)
RBC: 3.33 MIL/uL — ABNORMAL LOW (ref 4.22–5.81)
RDW: 12.9 % (ref 11.5–15.5)
WBC: 10.4 10*3/uL (ref 4.0–10.5)

## 2015-08-05 LAB — HEMOGLOBIN A1C
HEMOGLOBIN A1C: 7.5 % — AB (ref 4.8–5.6)
MEAN PLASMA GLUCOSE: 169 mg/dL

## 2015-08-05 LAB — BASIC METABOLIC PANEL
ANION GAP: 6 (ref 5–15)
BUN: 28 mg/dL — AB (ref 6–20)
CALCIUM: 8 mg/dL — AB (ref 8.9–10.3)
CO2: 20 mmol/L — ABNORMAL LOW (ref 22–32)
CREATININE: 3.4 mg/dL — AB (ref 0.61–1.24)
Chloride: 110 mmol/L (ref 101–111)
GFR calc Af Amer: 23 mL/min — ABNORMAL LOW (ref >60)
GFR, EST NON AFRICAN AMERICAN: 20 mL/min — AB (ref >60)
GLUCOSE: 137 mg/dL — AB (ref 65–99)
Potassium: 3.9 mmol/L (ref 3.5–5.1)
Sodium: 136 mmol/L (ref 135–145)

## 2015-08-05 LAB — MICROALBUMIN / CREATININE URINE RATIO
CREATININE, UR: 47.6 mg/dL
MICROALB/CREAT RATIO: 6083.6 mg/g{creat} — AB (ref 0.0–30.0)
Microalb, Ur: 2895.8 ug/mL — ABNORMAL HIGH

## 2015-08-05 LAB — GLUCOSE, CAPILLARY
GLUCOSE-CAPILLARY: 159 mg/dL — AB (ref 65–99)
Glucose-Capillary: 143 mg/dL — ABNORMAL HIGH (ref 65–99)
Glucose-Capillary: 133 mg/dL — ABNORMAL HIGH (ref 65–99)
Glucose-Capillary: 131 mg/dL — ABNORMAL HIGH (ref 65–99)

## 2015-08-05 LAB — CHLORIDE, URINE, RANDOM: Chloride Urine: 97 mmol/L

## 2015-08-05 MED ORDER — CARVEDILOL 25 MG PO TABS
25.0000 mg | ORAL_TABLET | Freq: Two times a day (BID) | ORAL | Status: DC
  Administered 2015-08-05 – 2015-08-06 (×3): 25 mg via ORAL
  Filled 2015-08-05 (×3): qty 1

## 2015-08-05 MED ORDER — OMEGA-3-ACID ETHYL ESTERS 1 G PO CAPS
1.0000 g | ORAL_CAPSULE | Freq: Two times a day (BID) | ORAL | Status: DC
  Administered 2015-08-05 – 2015-08-06 (×3): 1 g via ORAL
  Filled 2015-08-05 (×3): qty 1

## 2015-08-05 MED ORDER — SODIUM BICARBONATE 650 MG PO TABS
650.0000 mg | ORAL_TABLET | Freq: Two times a day (BID) | ORAL | Status: DC
  Administered 2015-08-05 – 2015-08-06 (×3): 650 mg via ORAL
  Filled 2015-08-05 (×3): qty 1

## 2015-08-05 MED ORDER — LORATADINE 10 MG PO TABS
10.0000 mg | ORAL_TABLET | Freq: Every day | ORAL | Status: DC
  Administered 2015-08-05 – 2015-08-06 (×2): 10 mg via ORAL
  Filled 2015-08-05 (×2): qty 1

## 2015-08-05 MED ORDER — PRAVASTATIN SODIUM 40 MG PO TABS
40.0000 mg | ORAL_TABLET | Freq: Every day | ORAL | Status: DC
  Administered 2015-08-05: 40 mg via ORAL
  Filled 2015-08-05: qty 1

## 2015-08-05 NOTE — Progress Notes (Signed)
[facility] PROGRESS NOTE  Eric Glenn [id number] DOB: 06/08/1966 DOA: 08/03/2015 PCP: No primary care provider on file.  Assessment/Plan: 1-chest pain:  -mild elevation of troponin, most likely due to renal dysfunction and demand ischemia from hypertensive urgency -on abnormalities on EKG -2-D echo with reassurance -will continue baby ASA, coreg and statins -patient currently w/o further CP  2-hypertensive urgency -controlled now -BP still elevated, but better -will continue amlodipine and coreg -follow VS and adjust medications further   3-uncontrolled diabetes: Type 2, no on insulin  -will follow A1C -continue SSI -intermittently using metformin (use to received medication from [place]) -will avoid metformin given renal function -glipizide and insulin at discharge -needs close follow up as an outpatient  4-CKD stage 4: -unknown baseline -base on GFR, will estimate stage 4 -no UTI on UA -renal US w/o hydronephrosis or uropathy -will avoid nephrotoxic agents -needs close follow up with PCP, control of BP/diabetes and outpatient follow up with nephrology  5-metabolic acidosis  -mild -most likely due to renal failure  -will use bicarb BID  6-HLD: will use pravachol and omega 3  7-sinusitis/bronchitis: -sepsis r/o -continue augmentin (plan is to treat for 7 days) -will add loratadine and continue flonase -influenza by PCR neg  Code Status: Full Family Communication: sister at bedside  Disposition Plan: home in 24/48 hours; waiting pending results for adjustments in his discharge meds and been afebrile.   Consultants:  None   Procedures:  2-D echo: - Left ventricle: The cavity size was normal. Wall thickness was increased in a pattern of mild LVH. Systolic function was normal. The estimated ejection fraction was in the range of 50% to 55%. Wall motion was normal; there were no regional wall motion abnormalities. Left ventricular  diastolic function parameters were normal. - Aortic valve: There was mild regurgitation. - Right atrium: The atrium was mildly dilated.   LE duplex: no DVT, SVT or baker cyst   See below for x-ray reports   Antibiotics:  Augmentin 1/16>>  HPI/Subjective: Low grade temp, no CP and reports breathing ok. Still with runny nose and sneezing.  Objective: Filed Vitals:   08/05/15 0800 08/05/15 1000  BP: 151/91 106/68  Pulse:    Temp:    Resp:      Intake/Output Summary (Last 24 hours) at 08/05/15 1330 Last data filed at 08/05/15 0900  Gross per 24 hour  Intake 1761.25 ml  Output   1510 ml  Net 251.25 ml   Filed Weights   08/03/15 2032 08/04/15 0626 08/05/15 0600  Weight: 91.853 kg (202 lb 8 oz) 94.348 kg (208 lb) 91.491 kg (201 lb 11.2 oz)    Exam:   General:  Low grade temp; no CP, positive runny nose. Denies nausea and vomiting.  Cardiovascular: S1 and S2, no rubs or gallops  Respiratory: scattered rhonchi, no wheezing; no crackles  Abdomen: soft, NT, ND, positive BS  Musculoskeletal: trace to 1 + edema bilaterally, no cyanosis   Data Reviewed: Basic Metabolic Panel:  Recent Labs Lab 08/03/15 1639 08/04/15 0408 08/04/15 0651 08/04/15 0801 08/05/15 0610  NA 139 138  --  137 136  K 4.6 4.5  --  4.5 3.9  CL 106 112*  --  110 110  CO2 24 20*  --  20* 20*  GLUCOSE 114* 118*  --  142* 137*  BUN 27* 26*  --  27* 28*  CREATININE 3.27* 3.29*  --  3.24* 3.40*  CALCIUM 9.0 7.7*  --  8.1* 8.0*  MG  --   --  1.7  --   --   PHOS  --   --  4.3  --   --    Liver Function Tests:  Recent Labs Lab 08/04/15 0801  AST 22  ALT 20  ALKPHOS 65  BILITOT 0.7  PROT 5.1*  ALBUMIN 2.3*   CBC:  Recent Labs Lab 08/03/15 1639 08/05/15 0610  WBC 14.1* 10.4  HGB 11.8* 9.9*  HCT 33.4* 28.0*  MCV 82.9 84.1  PLT 170 143*   Cardiac Enzymes:  Recent Labs Lab 08/04/15 0801 08/04/15 1352 08/04/15 2029  TROPONINI 0.10* 0.11* 0.10*   CBG:  Recent  Labs Lab 08/04/15 1132 08/04/15 1645 08/04/15 2126 08/05/15 0746 08/05/15 1145  GLUCAP 144* 120* 135* 143* 133*    Recent Results (from the past 240 hour(s))  Blood Culture (routine x 2)     Status: None (Preliminary result)   Collection Time: 08/03/15  7:55 PM  Result Value Ref Range Status   Specimen Description BLOOD RIGHT FOREARM  Final   Special Requests BOTTLES DRAWN AEROBIC AND ANAEROBIC 5CC   Final   Culture NO GROWTH 2 DAYS  Final   Report Status PENDING  Incomplete  Blood Culture (routine x 2)     Status: None (Preliminary result)   Collection Time: 08/03/15  8:30 PM  Result Value Ref Range Status   Specimen Description BLOOD RIGHT ANTECUBITAL  Final   Special Requests BOTTLES DRAWN AEROBIC AND ANAEROBIC 5CC   Final   Culture NO GROWTH 2 DAYS  Final   Report Status PENDING  Incomplete  Urine culture     Status: None   Collection Time: 08/03/15  8:35 PM  Result Value Ref Range Status   Specimen Description URINE, CLEAN CATCH  Final   Special Requests NONE  Final   Culture 4,000 COLONIES/mL INSIGNIFICANT GROWTH  Final   Report Status 08/04/2015 FINAL  Final     Studies: Dg Chest 2 View  08/03/2015  CLINICAL DATA:  Chest pain with nasal congestion and cough for 3 weeks EXAM: CHEST  2 VIEW COMPARISON:  May 15, 2007 FINDINGS: There is no edema or consolidation. The heart size and pulmonary vascularity are normal. No adenopathy. No bone lesions. No pneumothorax. IMPRESSION: No edema or consolidation. Electronically Signed   By: Lowella Grip III M.D.   On: 08/03/2015 17:47   Ct Head Wo Contrast  08/03/2015  CLINICAL DATA:  50 year old male with headaches and hypertension. EXAM: CT HEAD WITHOUT CONTRAST TECHNIQUE: Contiguous axial images were obtained from the base of the skull through the vertex without intravenous contrast. COMPARISON:  None. FINDINGS: The ventricles and sulci are appropriate in size for patient's age. Minimal periventricular and deep white matter  hypodensities represent chronic microvascular ischemic changes. There is no intracranial hemorrhage. No mass effect or midline shift identified. The visualized paranasal sinuses and mastoid air cells are well aerated. The calvarium is intact. IMPRESSION: No acute intracranial pathology. Electronically Signed   By: Anner Crete M.D.   On: 08/03/2015 23:23   US Renal  08/04/2015  CLINICAL DATA:  Chronic kidney disease EXAM: RENAL / URINARY TRACT ULTRASOUND COMPLETE COMPARISON:  None. FINDINGS: Right Kidney: Length: 14.6 cm. Echogenicity within normal limits. No mass or hydronephrosis visualized. Left Kidney: Length: 13.9 cm. Echogenicity within normal limits. There is a 1.5 x 1.1 x 1.3 cm anechoic left renal mass most consistent with a cyst. No hydronephrosis visualized. Bladder: Appears normal for degree of bladder distention. IMPRESSION: 1. No obstructive uropathy. Electronically Signed  By: Kathreen Devoid   On: 08/04/2015 12:06    Scheduled Meds: . amLODipine  10 mg Oral Daily  . amoxicillin-clavulanate  1 tablet Oral BID  . carvedilol  25 mg Oral BID WC  . fluticasone  1 spray Each Nare Daily  . insulin aspart  0-9 Units Subcutaneous TID WC  . loratadine  10 mg Oral Daily  . omega-3 acid ethyl esters  1 g Oral BID  . sodium bicarbonate  650 mg Oral BID  . sodium chloride  3 mL Intravenous Q12H   Continuous Infusions: . sodium chloride 75 mL/hr at 08/05/15 0425    Principal Problem:   Chest pain Active Problems:   Acute renal insufficiency   Hypertensive urgency   DM type 2, uncontrolled, with renal complications ([place])   Diabetic retinopathy associated with type 2 diabetes mellitus ([place])   Proteinuria   Leukocytosis    Time spent: 35 minutes    Barton Dubois  [facility] Pager [phone]. If 7PM-7AM, please contact night-coverage at [email or web address], password [facility] 08/05/2015, 1:30 PM  LOS: 2 days

## 2015-08-05 NOTE — Care Management Note (Signed)
Case Management Note  Patient Details  Name: Eric Glenn MRN: [id number] Date of Birth: February 26, 1966  Subjective/Objective:  Pt admitted for chest pain. No insurance or PCP @ this rime.   Action/Plan: CM did provide pt with the information to the [facility] for patient to call for Appointment. Pt will need generic Rx's at d/c so he can utilize [facility] $4.00 list. No further needs from CM at this time.    Expected Discharge Date:                  Expected Discharge Plan:  Home/Self Care  In-House Referral:  NA  Discharge planning Services  CM Consult, [facility] ([place] provided. in [place])  Post Acute Care Choice:  NA Choice offered to:  NA  DME Arranged:  N/A DME Agency:  NA  HH Arranged:  NA HH Agency:  NA  Status of Service:  Completed, signed off  Medicare Important Message Given:    Date Medicare IM Given:    Medicare IM give by:    Date Additional Medicare IM Given:    Additional Medicare Important Message give by:     If discussed at [place] of Stay Meetings, dates discussed:    Additional Comments:  Bethena Roys, RN 08/05/2015, 12:56 PM

## 2015-08-06 DIAGNOSIS — E1122 Type 2 diabetes mellitus with diabetic chronic kidney disease: Secondary | ICD-10-CM

## 2015-08-06 DIAGNOSIS — N289 Disorder of kidney and ureter, unspecified: Secondary | ICD-10-CM

## 2015-08-06 DIAGNOSIS — N184 Chronic kidney disease, stage 4 (severe): Secondary | ICD-10-CM

## 2015-08-06 DIAGNOSIS — R079 Chest pain, unspecified: Secondary | ICD-10-CM

## 2015-08-06 DIAGNOSIS — E1165 Type 2 diabetes mellitus with hyperglycemia: Secondary | ICD-10-CM

## 2015-08-06 LAB — GLUCOSE, CAPILLARY
GLUCOSE-CAPILLARY: 105 mg/dL — AB (ref 65–99)
Glucose-Capillary: 153 mg/dL — ABNORMAL HIGH (ref 65–99)

## 2015-08-06 LAB — BASIC METABOLIC PANEL
ANION GAP: 4 — AB (ref 5–15)
BUN: 29 mg/dL — ABNORMAL HIGH (ref 6–20)
CO2: 22 mmol/L (ref 22–32)
Calcium: 8 mg/dL — ABNORMAL LOW (ref 8.9–10.3)
Chloride: 110 mmol/L (ref 101–111)
Creatinine, Ser: 3.24 mg/dL — ABNORMAL HIGH (ref 0.61–1.24)
GFR, EST AFRICAN AMERICAN: 24 mL/min — AB (ref >60)
GFR, EST NON AFRICAN AMERICAN: 21 mL/min — AB (ref >60)
GLUCOSE: 178 mg/dL — AB (ref 65–99)
POTASSIUM: 4.1 mmol/L (ref 3.5–5.1)
Sodium: 136 mmol/L (ref 135–145)

## 2015-08-06 LAB — CBC
HCT: 27.3 % — ABNORMAL LOW (ref 39.0–52.0)
HEMOGLOBIN: 9.5 g/dL — AB (ref 13.0–17.0)
MCH: 29.6 pg (ref 26.0–34.0)
MCHC: 34.8 g/dL (ref 30.0–36.0)
MCV: 85 fL (ref 78.0–100.0)
Platelets: 136 10*3/uL — ABNORMAL LOW (ref 150–400)
RBC: 3.21 MIL/uL — ABNORMAL LOW (ref 4.22–5.81)
RDW: 12.8 % (ref 11.5–15.5)
WBC: 7.6 10*3/uL (ref 4.0–10.5)

## 2015-08-06 LAB — HEMOGLOBIN A1C
HEMOGLOBIN A1C: 7.6 % — AB (ref 4.8–5.6)
Mean Plasma Glucose: 171 mg/dL

## 2015-08-06 MED ORDER — PRAVASTATIN SODIUM 40 MG PO TABS
40.0000 mg | ORAL_TABLET | Freq: Every day | ORAL | Status: DC
Start: 2015-08-06 — End: 2015-08-19

## 2015-08-06 MED ORDER — GLYBURIDE-METFORMIN 2.5-500 MG PO TABS: 1.0000 | ORAL_TABLET | Freq: Two times a day (BID) | ORAL | Status: DC

## 2015-08-06 MED ORDER — FLUTICASONE PROPIONATE 50 MCG/ACT NA SUSP: 1.0000 | Freq: Every day | NASAL | Status: DC

## 2015-08-06 MED ORDER — CARVEDILOL 25 MG PO TABS: 25.0000 mg | ORAL_TABLET | Freq: Two times a day (BID) | ORAL | Status: DC

## 2015-08-06 MED ORDER — LORATADINE 10 MG PO TABS: 10.0000 mg | ORAL_TABLET | Freq: Every day | ORAL | Status: DC

## 2015-08-06 MED ORDER — AMOXICILLIN-POT CLAVULANATE 500-125 MG PO TABS: 1.0000 | ORAL_TABLET | Freq: Two times a day (BID) | ORAL | Status: DC

## 2015-08-06 MED ORDER — OMEGA-3-ACID ETHYL ESTERS 1 G PO CAPS: 1.0000 g | ORAL_CAPSULE | Freq: Two times a day (BID) | ORAL | Status: DC

## 2015-08-06 MED ORDER — AMLODIPINE BESYLATE 10 MG PO TABS: 10.0000 mg | ORAL_TABLET | Freq: Every day | ORAL | Status: DC

## 2015-08-06 NOTE — Discharge Instructions (Signed)
Hipertensin (Hypertension) La hipertensin, conocida comnmente como presin arterial alta, se produce cuando la sangre bombea en las arterias con mucha fuerza. Las arterias son los vasos sanguneos que transportan la sangre desde el corazn hacia todas las partes del cuerpo. Una lectura de la presin arterial consiste en un nmero ms alto sobre un nmero ms bajo, por ejemplo, 110/72. El nmero ms alto (presin sistlica) corresponde a la presin interna de las arterias cuando el corazn bombea sangre. El nmero ms bajo (presin diastlica) corresponde a la presin interna de las arterias cuando el corazn se relaja. En condiciones ideales, la presin arterial debe ser inferior a 120/80. La hipertensin fuerza al corazn a trabajar ms para bombear la sangre. Las arterias pueden estrecharse o ponerse rgidas. La hipertensin no tratada o no controlada puede causar infarto de miocardio, ictus, enfermedad renal y otros problemas. FACTORES DE RIESGO Algunos factores de riesgo de hipertensin son controlables, pero otros no lo son.  Entre los factores de riesgo que usted no puede controlar, se incluyen los siguientes:   La raza. El riesgo es mayor para las personas afroamericanas.  La edad. Los riesgos aumentan con la edad.  El sexo. Antes de los 45aos, los hombres corren ms riesgo que las mujeres. Despus de los 65aos, las mujeres corren ms riesgo que los hombres. Entre los factores de riesgo que usted puede controlar, se incluyen los siguientes:  No hacer la cantidad suficiente de actividad fsica o ejercicio.  Tener sobrepeso.  Consumir mucha grasa, azcar, caloras o sal en la dieta.  Beber alcohol en exceso. SIGNOS Y SNTOMAS Por lo general, la hipertensin no causa signos o sntomas. La hipertensin arterial demasiado alta (crisis hipertensiva) puede causar dolor de cabeza, ansiedad, falta de aire y hemorragia nasal. DIAGNSTICO Para detectar si usted tiene hipertensin, el  mdico le medir la presin arterial mientras est sentado, con el brazo levantado a la altura del corazn. Debe medirla al menos dos veces en el mismo brazo. Determinadas condiciones pueden causar una diferencia de presin arterial entre el brazo izquierdo y el derecho. El hecho de tener una sola lectura de la presin arterial ms alta que lo normal no significa que necesita un tratamiento. Si no est claro si tiene hipertensin arterial, es posible que se le pida que regrese otro da para volver a controlarle la presin arterial. O bien se le puede pedir que se controle la presin arterial en su casa durante 1 o ms meses. TRATAMIENTO El tratamiento de la hipertensin arterial incluye hacer cambios en el estilo de vida y, posiblemente, tomar medicamentos. Un estilo de vida saludable puede ayudar a bajar la presin arterial alta. Quiz deba cambiar algunos hbitos. Los cambios en el estilo de vida pueden incluir lo siguiente:  Seguir la dieta DASH. Esta dieta tiene un alto contenido de frutas, verduras y cereales integrales. Incluye poca cantidad de sal, carnes rojas y azcares agregados.  Mantenga el consumo de sodio por debajo de 2300 mg por da.  Realizar al menos entre 30 y 45 minutos de ejercicio aerbico, 4 veces por semana como mnimo.  Perder peso, si es necesario.  No fumar.  Limitar el consumo de bebidas alcohlicas.  Aprender formas de reducir el estrs. El mdico puede recetarle medicamentos si los cambios en el estilo de vida no son suficientes para lograr controlar la presin arterial y si una de las siguientes afirmaciones es verdadera:  Tiene entre 18 y 59 aos y su presin arterial sistlica est por encima de 140.  Tiene   60 aos o ms y su presin arterial sistlica est por encima de 150.  Su presin arterial diastlica est por encima de 90.  Tiene diabetes y su presin arterial sistlica est por encima de 140 o su presin arterial diastlica est por encima de  90.  Tiene una enfermedad renal y su presin arterial est por encima de 140/90.  Tiene una enfermedad cardaca y su presin arterial est por encima de 140/90. La presin arterial deseada puede variar en funcin de las enfermedades, la edad y otros factores personales. INSTRUCCIONES PARA EL CUIDADO EN EL HOGAR  Haga que le midan de nuevo la presin arterial segn las indicaciones del mdico.  Tome los medicamentos solamente como se lo haya indicado el mdico. Siga cuidadosamente las indicaciones. Los medicamentos para la presin arterial deben tomarse segn las indicaciones. Los medicamentos pierden eficacia al omitir las dosis. El hecho de omitir las dosis tambin aumenta el riesgo de otros problemas.  No fume.  Contrlese la presin arterial en su casa segn las indicaciones del mdico. SOLICITE ATENCIN MDICA SI:   Piensa que tiene una reaccin alrgica a los medicamentos.  Tiene mareos o dolores de cabeza con recurrencia.  Tiene hinchazn en los tobillos.  Tiene problemas de visin. SOLICITE ATENCIN MDICA DE INMEDIATO SI:  Siente un dolor de cabeza intenso o confusin.  Siente debilidad inusual, adormecimiento o que se desmayar.  Siente dolor intenso en el pecho o en el abdomen.  Vomita repetidas veces.  Tiene dificultad para respirar. ASEGRESE DE QUE:   Comprende estas instrucciones.  Controlar su afeccin.  Recibir ayuda de inmediato si no mejora o si empeora.   Esta informacin no tiene como fin reemplazar el consejo del mdico. Asegrese de hacerle al mdico cualquier pregunta que tenga.   Document Released: 07/05/2005 Document Revised: 11/19/2014 Elsevier Interactive Patient Education 2016 Elsevier Inc.  

## 2015-08-06 NOTE — Discharge Summary (Signed)
Physician Discharge Summary  Eric Glenn [id number] DOB: 06/14/1966 DOA: 08/03/2015  PCP: No primary care provider on file.  Admit date: 08/03/2015 Discharge date: 08/06/2015  Time spent: 30 minutes  Recommendations for Outpatient Follow-up:  1. Follow up with PCP in one week.  2. Follow up with nephrologist as soon as possible.    Discharge Diagnoses:  Principal Problem:   Chest pain Active Problems:   Acute renal insufficiency   Hypertensive urgency   DM type 2, uncontrolled, with renal complications (HCC)   Diabetic retinopathy associated with type 2 diabetes mellitus (HCC)   Proteinuria   Leukocytosis   Discharge Condition: improved  Diet recommendation: carb modified diet.   Filed Weights   08/04/15 0626 08/05/15 0600 08/06/15 0500  Weight: 94.348 kg (208 lb) 91.491 kg (201 lb 11.2 oz) 92.715 kg (204 lb 6.4 oz)    History of present illness:  Eric Glenn is a 50 y.o. male with a past medical history of type 2 diabetes, hypertension who comes to the emergency department due to chest pressure for 2 hours and headache, chills, dyspnea nasal congestion and nonproductive cough for the past 2 weeks.  Hospital Course:  1-chest pain:  -mild elevation of troponin, most likely due to renal dysfunction and demand ischemia from hypertensive urgency. Resolved.  -no abnormalities on EKG -2-D echo with reassurance -will continue baby ASA, coreg and statins -patient currently w/o further CP  2-hypertensive urgency -controlled now - resume norvasc and coreg.   3-uncontrolled diabetes: Type 2, no on insulin  hgba1c is 7.6 Resume home meds on discharge.   4-CKD stage 4: -unknown baseline -base on GFR, will estimate stage 4 -no UTI on UA -renal US w/o hydronephrosis or uropathy -will avoid nephrotoxic agents -needs close follow up with PCP, control of BP/diabetes and outpatient follow up with nephrology - outpatient follow up with [facility].   5-metabolic acidosis  -mild -most likely due to renal failure  Resolved.   6-HLD: will use pravachol and omega 3  7-sinusitis/bronchitis: -sepsis r/o -continue augmentin to complete the course.  -will add loratadine and continue flonase -influenza by PCR neg   Procedures:  US renal  Echo    Consultations:  none  Discharge Exam: Filed Vitals:   08/06/15 0500 08/06/15 1305  BP: 165/85 133/76  Pulse: 66 65  Temp: 98.3 F (36.8 C) 97.8 F (36.6 C)  Resp: 18 18    General: alert afebrile comfortable. Cardiovascular: s1s2 Respiratory: ctab  Discharge Instructions   Discharge Instructions    Diet - low sodium heart healthy    Complete by:  As directed      Diet Carb Modified    Complete by:  As directed      Discharge instructions    Complete by:  As directed   PLEASE FOLLOW UP WITH KIDNEY PHYSICIAN AS RECOMMENDED IN 1 TO2 WEEKS.  PLEASE FOLLOW UP WITH PCP in 1 to 2 weeks.          Current Discharge Medication List    START taking these medications   Details  amLODipine (NORVASC) 10 MG tablet Take 1 tablet (10 mg total) by mouth daily. Qty: 30 tablet, Refills: 0    amoxicillin-clavulanate (AUGMENTIN) 500-125 MG tablet Take 1 tablet (500 mg total) by mouth 2 (two) times daily. Qty: 10 tablet, Refills: 0    carvedilol (COREG) 25 MG tablet Take 1 tablet (25 mg total) by mouth 2 (two) times daily with a meal. Qty: 60 tablet, Refills:  0    fluticasone (FLONASE) 50 MCG/ACT nasal spray Place 1 spray into both nostrils daily. Qty: 16 g, Refills: 2    loratadine (CLARITIN) 10 MG tablet Take 1 tablet (10 mg total) by mouth daily. Qty: 10 tablet, Refills: 0    omega-3 acid ethyl esters (LOVAZA) 1 g capsule Take 1 capsule (1 g total) by mouth 2 (two) times daily. Qty: 60 capsule, Refills: 1    pravastatin (PRAVACHOL) 40 MG tablet Take 1 tablet (40 mg total) by mouth daily at 6 PM. Qty: 30 tablet, Refills: 1      CONTINUE these  medications which have CHANGED   Details  glyBURIDE-metformin (GLUCOVANCE) 2.5-500 MG tablet Take 1 tablet by mouth 2 (two) times daily with a meal. Qty: 60 tablet, Refills: 0      CONTINUE these medications which have NOT CHANGED   Details  Multiple Vitamin (MULTIVITAMIN WITH MINERALS) TABS tablet Take 1 tablet by mouth daily.    OVER THE COUNTER MEDICATION Place 1 spray into both nostrils daily as needed (congestion). Over the counter nasal spray      STOP taking these medications     enalapril (VASOTEC) 10 MG tablet        Allergies  Allergen Reactions  . Pork-Derived Products Rash and Other (See Comments)    Raises blood pressure   Follow-up Information    Follow up with [facility].   Contact information:   [address] [place] [phone]      Follow up with [facility]. Schedule an appointment as soon as possible for a visit in 1 week.   Contact information:   [place] [id number] [phone]        The results of significant diagnostics from this hospitalization (including imaging, microbiology, ancillary and laboratory) are listed below for reference.    Significant Diagnostic Studies: Dg Chest 2 View  08/03/2015  CLINICAL DATA:  Chest pain with nasal congestion and cough for 3 weeks EXAM: CHEST  2 VIEW COMPARISON:  May 15, 2007 FINDINGS: There is no edema or consolidation. The heart size and pulmonary vascularity are normal. No adenopathy. No bone lesions. No pneumothorax. IMPRESSION: No edema or consolidation. Electronically Signed   By: Lowella Grip III M.D.   On: 08/03/2015 17:47   Ct Head Wo Contrast  08/03/2015  CLINICAL DATA:  50 year old male with headaches and hypertension. EXAM: CT HEAD WITHOUT CONTRAST TECHNIQUE: Contiguous axial images were obtained from the base of the skull through the vertex without intravenous contrast. COMPARISON:  None. FINDINGS: The  ventricles and sulci are appropriate in size for patient's age. Minimal periventricular and deep white matter hypodensities represent chronic microvascular ischemic changes. There is no intracranial hemorrhage. No mass effect or midline shift identified. The visualized paranasal sinuses and mastoid air cells are well aerated. The calvarium is intact. IMPRESSION: No acute intracranial pathology. Electronically Signed   By: Anner Crete M.D.   On: 08/03/2015 23:23   US Renal  08/04/2015  CLINICAL DATA:  Chronic kidney disease EXAM: RENAL / URINARY TRACT ULTRASOUND COMPLETE COMPARISON:  None. FINDINGS: Right Kidney: Length: 14.6 cm. Echogenicity within normal limits. No mass or hydronephrosis visualized. Left Kidney: Length: 13.9 cm. Echogenicity within normal limits. There is a 1.5 x 1.1 x 1.3 cm anechoic left renal mass most consistent with a cyst. No hydronephrosis visualized. Bladder: Appears normal for degree of bladder distention. IMPRESSION: 1. No obstructive uropathy. Electronically Signed  By: Kathreen Devoid   On: 08/04/2015 12:06    Microbiology: Recent Results (from the past 240 hour(s))  Blood Culture (routine x 2)     Status: None (Preliminary result)   Collection Time: 08/03/15  7:55 PM  Result Value Ref Range Status   Specimen Description BLOOD RIGHT FOREARM  Final   Special Requests BOTTLES DRAWN AEROBIC AND ANAEROBIC 5CC   Final   Culture NO GROWTH 3 DAYS  Final   Report Status PENDING  Incomplete  Blood Culture (routine x 2)     Status: None (Preliminary result)   Collection Time: 08/03/15  8:30 PM  Result Value Ref Range Status   Specimen Description BLOOD RIGHT ANTECUBITAL  Final   Special Requests BOTTLES DRAWN AEROBIC AND ANAEROBIC 5CC   Final   Culture NO GROWTH 3 DAYS  Final   Report Status PENDING  Incomplete  Urine culture     Status: None   Collection Time: 08/03/15  8:35 PM  Result Value Ref Range Status   Specimen Description URINE, CLEAN CATCH  Final   Special  Requests NONE  Final   Culture 4,000 COLONIES/mL INSIGNIFICANT GROWTH  Final   Report Status 08/04/2015 FINAL  Final     Labs: Basic Metabolic Panel:  Recent Labs Lab 08/04/15 0408 08/04/15 0651 08/04/15 0801 08/05/15 0610 08/05/15 1430 08/06/15 1036  NA 138  --  137 136 137 136  K 4.5  --  4.5 3.9 4.4 4.1  CL 112*  --  110 110 109 110  CO2 20*  --  20* 20* 21* 22  GLUCOSE 118*  --  142* 137* 155* 178*  BUN 26*  --  27* 28* 29* 29*  CREATININE 3.29*  --  3.24* 3.40* 3.52* 3.24*  CALCIUM 7.7*  --  8.1* 8.0* 8.1* 8.0*  MG  --  1.7  --   --   --   --   PHOS  --  4.3  --   --  5.0*  --    Liver Function Tests:  Recent Labs Lab 08/04/15 0801 08/05/15 1430  AST 22  --   ALT 20  --   ALKPHOS 65  --   BILITOT 0.7  --   PROT 5.1*  --   ALBUMIN 2.3* 2.2*   No results for input(s): LIPASE, AMYLASE in the last 168 hours. No results for input(s): AMMONIA in the last 168 hours. CBC:  Recent Labs Lab 08/03/15 1639 08/05/15 0610 08/06/15 0539  WBC 14.1* 10.4 7.6  HGB 11.8* 9.9* 9.5*  HCT 33.4* 28.0* 27.3*  MCV 82.9 84.1 85.0  PLT 170 143* 136*   Cardiac Enzymes:  Recent Labs Lab 08/04/15 0801 08/04/15 1352 08/04/15 2029  TROPONINI 0.10* 0.11* 0.10*   BNP: BNP (last 3 results) No results for input(s): BNP in the last 8760 hours.  ProBNP (last 3 results) No results for input(s): PROBNP in the last 8760 hours.  CBG:  Recent Labs Lab 08/05/15 1145 08/05/15 1636 08/05/15 2150 08/06/15 0723 08/06/15 1157  GLUCAP 133* 131* 159* 105* 153*       Signed:  Josanna Hefel MD.  [facility] 08/06/2015, 4:04 PM

## 2015-08-07 LAB — LDL CHOLESTEROL, DIRECT: LDL DIRECT: 94 mg/dL (ref 0–99)

## 2015-08-08 LAB — CULTURE, BLOOD (ROUTINE X 2)
CULTURE: NO GROWTH
Culture: NO GROWTH

## 2015-08-19 ENCOUNTER — Encounter: Payer: Self-pay | Admitting: Family Medicine

## 2015-08-19 ENCOUNTER — Ambulatory Visit: Payer: Self-pay | Attending: Family Medicine | Admitting: Family Medicine

## 2015-08-19 VITALS — BP 132/73 | HR 63 | Temp 97.4°F | Resp 16 | Ht 66.5 in | Wt 210.0 lb

## 2015-08-19 DIAGNOSIS — J329 Chronic sinusitis, unspecified: Secondary | ICD-10-CM | POA: Insufficient documentation

## 2015-08-19 DIAGNOSIS — E1165 Type 2 diabetes mellitus with hyperglycemia: Secondary | ICD-10-CM | POA: Insufficient documentation

## 2015-08-19 DIAGNOSIS — E1122 Type 2 diabetes mellitus with diabetic chronic kidney disease: Secondary | ICD-10-CM | POA: Insufficient documentation

## 2015-08-19 DIAGNOSIS — I10 Essential (primary) hypertension: Secondary | ICD-10-CM

## 2015-08-19 DIAGNOSIS — N184 Chronic kidney disease, stage 4 (severe): Secondary | ICD-10-CM

## 2015-08-19 DIAGNOSIS — E11319 Type 2 diabetes mellitus with unspecified diabetic retinopathy without macular edema: Secondary | ICD-10-CM

## 2015-08-19 DIAGNOSIS — I129 Hypertensive chronic kidney disease with stage 1 through stage 4 chronic kidney disease, or unspecified chronic kidney disease: Secondary | ICD-10-CM | POA: Insufficient documentation

## 2015-08-19 DIAGNOSIS — E785 Hyperlipidemia, unspecified: Secondary | ICD-10-CM

## 2015-08-19 DIAGNOSIS — IMO0002 Reserved for concepts with insufficient information to code with codable children: Secondary | ICD-10-CM

## 2015-08-19 DIAGNOSIS — J322 Chronic ethmoidal sinusitis: Secondary | ICD-10-CM

## 2015-08-19 DIAGNOSIS — R600 Localized edema: Secondary | ICD-10-CM

## 2015-08-19 DIAGNOSIS — Z79899 Other long term (current) drug therapy: Secondary | ICD-10-CM | POA: Insufficient documentation

## 2015-08-19 DIAGNOSIS — R0789 Other chest pain: Secondary | ICD-10-CM | POA: Insufficient documentation

## 2015-08-19 LAB — GLUCOSE, POCT (MANUAL RESULT ENTRY): POC GLUCOSE: 86 mg/dL (ref 70–99)

## 2015-08-19 MED ORDER — CARVEDILOL 25 MG PO TABS: 25.0000 mg | ORAL_TABLET | Freq: Two times a day (BID) | ORAL | Status: DC

## 2015-08-19 MED ORDER — CETIRIZINE HCL 10 MG PO TABS: 10.0000 mg | ORAL_TABLET | Freq: Every day | ORAL | Status: DC

## 2015-08-19 MED ORDER — AMLODIPINE BESYLATE 10 MG PO TABS: 10.0000 mg | ORAL_TABLET | Freq: Every day | ORAL | Status: DC

## 2015-08-19 MED ORDER — PRAVASTATIN SODIUM 40 MG PO TABS: 40.0000 mg | ORAL_TABLET | Freq: Every day | ORAL | Status: DC

## 2015-08-19 MED ORDER — TRUEPLUS LANCETS 28G MISC: 1.0000 | Freq: Three times a day (TID) | Status: DC

## 2015-08-19 MED ORDER — GLUCOSE BLOOD VI STRP: ORAL_STRIP | Status: DC

## 2015-08-19 MED ORDER — GLIPIZIDE 5 MG PO TABS: 5.0000 mg | ORAL_TABLET | Freq: Two times a day (BID) | ORAL | Status: DC

## 2015-08-19 MED ORDER — TRUE METRIX METER DEVI: 1.0000 | Freq: Three times a day (TID) | Status: DC

## 2015-08-19 NOTE — Patient Instructions (Signed)
La diabetes mellitus y los alimentos (Diabetes Mellitus and Food) Es importante que controle su nivel de azcar en la sangre (glucosa). El nivel de glucosa en sangre depende en gran medida de lo que usted come. Comer alimentos saludables en las cantidades [place] a lo largo del Training and development officer, aproximadamente a la misma hora [employer], lo ayudar a Chief Technology Officer su nivel de Multimedia programmer. Tambin puede ayudarlo a retrasar o Patent attorney de la diabetes mellitus. Comer de [employer] saludable incluso puede ayudarlo a Chartered loss adjuster de presin arterial y a Science writer o Theatre manager un peso saludable.  Entre las recomendaciones generales para alimentarse y Audiological scientist los alimentos de forma saludable, se incluyen las siguientes:  Respetar las comidas principales y comer colaciones con regularidad. Evitar pasar largos perodos sin comer con el fin de perder peso.  Seguir una dieta que consista principalmente en alimentos de origen vegetal, como frutas, vegetales, frutos secos, legumbres y cereales integrales.  Utilizar mtodos de coccin a baja temperatura, como hornear, en lugar de mtodos de coccin a alta temperatura, como frer en abundante aceite. Trabaje con el nutricionista para aprender a Financial planner nutricional de las etiquetas de los alimentos. CMO PUEDEN AFECTARME LOS ALIMENTOS? Carbohidratos Los carbohidratos afectan el nivel de glucosa en sangre ms que cualquier otro tipo de alimento. El nutricionista lo ayudar a Teacher, adult education cuntos carbohidratos puede consumir en cada comida y ensearle a contarlos. El recuento de carbohidratos es importante para mantener la glucosa en sangre en un nivel saludable, en especial si utiliza insulina o toma determinados medicamentos para la diabetes mellitus. Alcohol El alcohol puede provocar disminuciones sbitas de la glucosa en sangre (hipoglucemia), en especial si utiliza insulina o toma determinados medicamentos para la diabetes mellitus. La  hipoglucemia es una afeccin que puede poner en peligro la vida. Los sntomas de la hipoglucemia (somnolencia, mareos y Data processing manager) son similares a los sntomas de haber consumido mucho alcohol.  Si el mdico lo autoriza a beber alcohol, hgalo con moderacin y siga estas pautas:  Las mujeres no deben beber ms de un trago por da, y los hombres no deben beber ms de dos tragos por Training and development officer. Un trago es igual a:  12 onzas (355 ml) de cerveza  5 onzas de vino (150 ml) de vino  1,5onzas (23m) de bebidas espirituosas  No beba con el estmago vaco.  Mantngase hidratado. Beba agua, gaseosas dietticas o t helado sin azcar.  Las gaseosas comunes, los jugos y otros refrescos podran contener muchos carbohidratos y se dCivil Service fast streamer QU ALIMENTOS NO SE RECOMIENDAN? Cuando haga las elecciones de alimentos, es importante que recuerde que todos los alimentos son distintos. Algunos tienen menos nutrientes que otros por porcin, aunque podran tener la misma cantidad de caloras o carbohidratos. Es difcil darle al cuerpo lo que necesita cuando consume alimentos con menos nutrientes. Estos son algunos ejemplos de alimentos que debera evitar ya que contienen muchas caloras y carbohidratos, pero pocos nutrientes:  GPhysicist, medicaltrans (la mayora de los alimentos procesados incluyen grasas trans en la etiqueta de Informacin nutricional).  Gaseosas comunes.  Jugos.  Caramelos.  Dulces, como tortas, pasteles, rosquillas y g[place]  Comidas fritas. QU ALIMENTOS PUEDO COMER? Consuma alimentos ricos en nutrientes, que nutrirn el cuerpo y lo mantendrn saludable. Los alimentos que debe comer tambin dependern de varios factores, como:  Las caloras que necesita.  Los medicamentos que toma.  Su peso.  El nivel de glucosa en s[employer]ist [employer]  El nArrow Rockde presin arterial.  El nivel de colesterol.  Debe consumir una amplia variedad de alimentos, por ejemplo:  Protenas.  Cortes de [employer].  Protenas con bajo contenido de grasas saturadas, como pescado, clara de huevo y frijoles. Evite las carnes procesadas.  Frutas y vegetales.  Frutas y Photographer que pueden ayudar a Chief Technology Officer los niveles sanguneos de [place], como [place], mangos y batatas.  Productos lcteos.  Elija productos lcteos sin grasa o con bajo contenido de [place], como [place], yogur y [place].  Cereales, panes, pastas y arroz.  Elija cereales integrales, como panes multicereales, avena en grano y arroz integral. Estos alimentos pueden ayudar a controlar la presin arterial.  Daphene Jaeger.  Alimentos que contengan grasas saludables, como frutos secos, Musician, aceite de [place], aceite de canola y pescado. TODOS LOS QUE PADECEN DIABETES MELLITUS TIENEN EL [place] PLAN DE [place]? Dado que todas las personas que padecen diabetes mellitus son distintas, no hay un solo plan de comidas que funcione para todos. Es muy importante que se rena con un nutricionista que lo ayudar a crear un plan de comidas adecuado para usted.   Esta informacin no tiene Marine scientist el consejo del mdico. Asegrese de hacerle al mdico cualquier pregunta que tenga.   Document Released: 10/12/2007 Document Revised: 07/26/2014 Elsevier Interactive Patient Education [employer].

## 2015-08-19 NOTE — Progress Notes (Signed)
Subjective:  Patient ID: Eric Glenn, male    DOB: 1966-06-25  Age: 50 y.o. MRN: [id number]  CC: Hospitalization Follow-up   HPI Adiv Bonafede piece a 50 year old male with a history of type 2 diabetes mellitus (A1c 7.6), hypertension, stage IV chronic kidney disease, hyperlipidemia who comes in today for follow hospital follow-up after hospitalization at [facility] from 08/03/15-08/06/15.  He had presented with chest pressure and upper respiratory symptoms; troponins were mildly elevated at 0.1, 0.11, 0.1 (which were thought to be secondary to demand ischemia), EKG was unremarkable, 2-D echo revealed EF of 50-55%, no regional wall motion abnormalities, mild aortic regurg, mildly dilated right atrium. He was in hypertensive urgency and his carvedilol and amlodipine were restarted. Renal ultrasound done for chronic kidney disease was unremarkable and he was placed on Augmentin, loratadine and Flonase for sinusitis versus bronchitis.  Today he informs me that he is concerned about his bilateral lower extremity edema which he has had for the last 4 months but denies shortness of breath or orthopnea or chest pain. He also complains of left nasal congestion and postnasal drip which is not relieved by using Flonase.  Outpatient Prescriptions Prior to Visit  Medication Sig Dispense Refill  . fluticasone (FLONASE) 50 MCG/ACT nasal spray Place 1 spray into both nostrils daily. 16 g 2  . loratadine (CLARITIN) 10 MG tablet Take 1 tablet (10 mg total) by mouth daily. 10 tablet 0  . Multiple Vitamin (MULTIVITAMIN WITH MINERALS) TABS tablet Take 1 tablet by mouth daily.    Marland Kitchen omega-3 acid ethyl esters (LOVAZA) 1 g capsule Take 1 capsule (1 g total) by mouth 2 (two) times daily. 60 capsule 1  . OVER THE COUNTER MEDICATION Place 1 spray into both nostrils daily as needed (congestion). Over the counter nasal spray    . amLODipine (NORVASC) 10 MG tablet Take 1 tablet (10 mg total) by mouth  daily. 30 tablet 0  . carvedilol (COREG) 25 MG tablet Take 1 tablet (25 mg total) by mouth 2 (two) times daily with a meal. 60 tablet 0  . glyBURIDE-metformin (GLUCOVANCE) 2.5-500 MG tablet Take 1 tablet by mouth 2 (two) times daily with a meal. 60 tablet 0  . pravastatin (PRAVACHOL) 40 MG tablet Take 1 tablet (40 mg total) by mouth daily at 6 PM. 30 tablet 1  . amoxicillin-clavulanate (AUGMENTIN) 500-125 MG tablet Take 1 tablet (500 mg total) by mouth 2 (two) times daily. (Patient not taking: Reported on 08/19/2015) 10 tablet 0   No facility-administered medications prior to visit.    ROS Review of Systems  Constitutional: Negative for activity change and appetite change.  HENT: Positive for postnasal drip. Negative for sinus pressure and sore throat.   Eyes: Negative for visual disturbance.  Respiratory: Negative for cough, chest tightness and shortness of breath.   Cardiovascular: Positive for leg swelling. Negative for chest pain.  Gastrointestinal: Negative for abdominal pain, diarrhea, constipation and abdominal distention.  Endocrine: Negative.   Genitourinary: Negative for dysuria.  Musculoskeletal: Negative for myalgias and joint swelling.  Skin: Negative for rash.  Allergic/Immunologic: Negative.   Neurological: Negative for weakness, light-headedness and numbness.  Psychiatric/Behavioral: Negative for suicidal ideas and dysphoric mood.    Objective:  BP 132/73 mmHg  Pulse 63  Temp(Src) 97.4 F (36.3 C) (Oral)  Resp 16  Ht 5' 6.5" (1.689 m)  Wt 210 lb (95.255 kg)  BMI 33.39 kg/m2  SpO2 97%  BP/Weight 08/19/2015 99991111  Systolic BP Q000111Q Q000111Q  Diastolic BP  73 76  Wt. (Lbs) 210 204.4  BMI 33.39 34.01    CMP Latest Ref Rng 08/06/2015 08/05/2015 08/05/2015  Glucose 65 - 99 mg/dL 178(H) 155(H) 137(H)  BUN 6 - 20 mg/dL 29(H) 29(H) 28(H)  Creatinine 0.61 - 1.24 mg/dL 3.24(H) 3.52(H) 3.40(H)  Sodium 135 - 145 mmol/L 136 137 136  Potassium 3.5 - 5.1 mmol/L 4.1 4.4 3.9    Chloride 101 - 111 mmol/L 110 109 110  CO2 22 - 32 mmol/L 22 21(L) 20(L)  Calcium 8.9 - 10.3 mg/dL 8.0(L) 8.1(L) 8.0(L)  Total Protein 6.5 - 8.1 g/dL - - -  Total Bilirubin 0.3 - 1.2 mg/dL - - -  Alkaline Phos 38 - 126 U/L - - -  AST 15 - 41 U/L - - -  ALT 17 - 63 U/L - - -    Lab Results  Component Value Date   HGBA1C 7.6* 08/05/2015     Physical Exam Constitutional: normal appearing,  HEENT: Head is atraumatic, normal sinuses, normal oropharynx, normal appearing tonsils and palate, tympanic membrane is normal bilaterally. Neck: normal range of motion, no thyromegaly, no JVD Cardiovascular: normal rate and rhythm, normal heart sounds, no murmurs, rub or gallop,  Bilateral 1+ pitting pedal edema Respiratory: clear to auscultation bilaterally, no wheezes, no rales, no rhonchi Abdomen: soft, not tender to palpation, normal bowel sounds, no enlarged organs Extremities: Full ROM, no tenderness in joints Skin: warm and dry, no lesions. Neurological: alert, oriented x3, cranial nerves I-XII grossly intact , normal motor strength, normal sensation. Psychological: normal mood.   Assessment & Plan:   1. 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 Discontinue metformin due to  On kidney disease. Increased dose of glipizide to 5 mg twice a day. If hyperglycemic at next visit I will further increase the dose of glipizide. Scheduled to see clinical pharmacist for diabetic teaching.  Review blood sugar log at next office visit. - Glucose (CBG) - glipiZIDE (GLUCOTROL) 5 MG tablet; Take 1 tablet (5 mg total) by mouth 2 (two) times daily before a meal.  Dispense: 60 tablet; Refill: 2 - glucose blood (TRUE METRIX BLOOD GLUCOSE TEST) test strip; Use three times daily before meals  Dispense: 100 each; Refill: 12 - Blood Glucose Monitoring Suppl (TRUE METRIX METER) DEVI; 1 each by Does not apply route 3  (three) times daily before meals.  Dispense: 1 Device; Refill: 0 - TRUEPLUS LANCETS 28G MISC; 1 each by Does not apply route 3 (three) times daily before meals.  Dispense: 100 each; Refill: 12  2. Hyperlipidemia   Will need lipid panel in the next visit - pravastatin (PRAVACHOL) 40 MG tablet; Take 1 tablet (40 mg total) by mouth daily at 6 PM.  Dispense: 30 tablet; Refill: 2  3. Uncontrolled type 2 diabetes mellitus with stage 4 chronic kidney disease, without long-term current use of insulin (HCC)  advised to avoid nephrotoxic agents  4. Essential hypertension  controlled - carvedilol (COREG) 25 MG tablet; Take 1 tablet (25 mg total) by mouth 2 (two) times daily with a meal.  Dispense: 60 tablet; Refill: 2 - amLODipine (NORVASC) 10 MG tablet; Take 1 tablet (10 mg total) by mouth daily.  Dispense: 30 tablet; Refill: 2  5. Chronic kidney disease, stage IV (severe) ([place])  he will need to be referred to nephrology for optimization of management.  6. Pedal edema  amlodipine could contribute to pedal edema but the patient  insists  Onset of pedal edema preceded initiation of amlodipine. Advised to elevate feet, use compression stockings, low-sodium diet.  7. Chronic sinusitis  continue Flonase. Zyrtec added to regimen.   Meds ordered this encounter  Medications  . carvedilol (COREG) 25 MG tablet    Sig: Take 1 tablet (25 mg total) by mouth 2 (two) times daily with a meal.    Dispense:  60 tablet    Refill:  2  . pravastatin (PRAVACHOL) 40 MG tablet    Sig: Take 1 tablet (40 mg total) by mouth daily at 6 PM.    Dispense:  30 tablet    Refill:  2  . glipiZIDE (GLUCOTROL) 5 MG tablet    Sig: Take 1 tablet (5 mg total) by mouth 2 (two) times daily before a meal.    Dispense:  60 tablet    Refill:  2  . amLODipine (NORVASC) 10 MG tablet    Sig: Take 1 tablet (10 mg total) by mouth daily.    Dispense:  30 tablet    Refill:  2  . glucose blood (TRUE METRIX BLOOD GLUCOSE TEST) test  strip    Sig: Use three times daily before meals    Dispense:  100 each    Refill:  12  . Blood Glucose Monitoring Suppl (TRUE METRIX METER) DEVI    Sig: 1 each by Does not apply route 3 (three) times daily before meals.    Dispense:  1 Device    Refill:  0  . TRUEPLUS LANCETS 28G MISC    Sig: 1 each by Does not apply route 3 (three) times daily before meals.    Dispense:  100 each    Refill:  12    Follow-up: Return in about 2 weeks (around 09/02/2015) for follow up of Diabetes Mellitus; schedule with Erline Levine for Diabetic teaching.   Arnoldo Morale MD

## 2015-08-19 NOTE — Progress Notes (Signed)
Patient's here for hospital f/up.   Patient states that he's having constant welling in his feet. Pain rated at  5/10.  Patient had flu vaccine on 08/05/15.

## 2015-08-20 MED FILL — glipiZIDE 5 MG TABS: 5 | 30 days supply | Qty: 60 | Fill #0

## 2015-08-20 MED FILL — TRUE METRIX TEST STRIP: 33 days supply | Qty: 100 | Fill #0

## 2015-08-20 MED FILL — ?AMLODIPINE BESYLATE 10 MG: 10 | 30 days supply | Qty: 30 | Fill #0

## 2015-08-20 MED FILL — !TRUE METRIX BLOOD GLUCOSE: 365 days supply | Qty: 1 | Fill #0

## 2015-08-20 MED FILL — PRAVASTATIN NA 40 MG TAB: 40 | 30 days supply | Qty: 30 | Fill #0

## 2015-08-20 MED FILL — ?CARVEDILOL 25 MG TABLET: 25 | 30 days supply | Qty: 60 | Fill #0

## 2015-08-20 MED FILL — ?CETIRIZINE HCL 10 MG TABLE: 10 | 30 days supply | Qty: 30 | Fill #0

## 2015-08-20 MED FILL — TRUEplus LANCETS 28G MISC: 33 days supply | Qty: 100 | Fill #0

## 2015-08-26 ENCOUNTER — Encounter: Payer: Self-pay | Admitting: Family Medicine

## 2015-08-26 ENCOUNTER — Ambulatory Visit: Payer: Self-pay | Attending: Family Medicine | Admitting: Family Medicine

## 2015-08-26 VITALS — BP 160/90 | HR 64 | Temp 97.6°F | Resp 15 | Ht 66.5 in | Wt 217.0 lb

## 2015-08-26 DIAGNOSIS — K5909 Other constipation: Secondary | ICD-10-CM

## 2015-08-26 DIAGNOSIS — R400 Somnolence: Secondary | ICD-10-CM

## 2015-08-26 DIAGNOSIS — R0602 Shortness of breath: Secondary | ICD-10-CM | POA: Insufficient documentation

## 2015-08-26 DIAGNOSIS — E1122 Type 2 diabetes mellitus with diabetic chronic kidney disease: Secondary | ICD-10-CM | POA: Insufficient documentation

## 2015-08-26 DIAGNOSIS — J329 Chronic sinusitis, unspecified: Secondary | ICD-10-CM | POA: Insufficient documentation

## 2015-08-26 DIAGNOSIS — E1165 Type 2 diabetes mellitus with hyperglycemia: Secondary | ICD-10-CM | POA: Insufficient documentation

## 2015-08-26 DIAGNOSIS — K5900 Constipation, unspecified: Secondary | ICD-10-CM | POA: Insufficient documentation

## 2015-08-26 DIAGNOSIS — J322 Chronic ethmoidal sinusitis: Secondary | ICD-10-CM

## 2015-08-26 DIAGNOSIS — Z79899 Other long term (current) drug therapy: Secondary | ICD-10-CM | POA: Insufficient documentation

## 2015-08-26 DIAGNOSIS — G4710 Hypersomnia, unspecified: Secondary | ICD-10-CM

## 2015-08-26 DIAGNOSIS — Z9889 Other specified postprocedural states: Secondary | ICD-10-CM | POA: Insufficient documentation

## 2015-08-26 DIAGNOSIS — I10 Essential (primary) hypertension: Secondary | ICD-10-CM

## 2015-08-26 DIAGNOSIS — E7800 Pure hypercholesterolemia, unspecified: Secondary | ICD-10-CM | POA: Insufficient documentation

## 2015-08-26 DIAGNOSIS — E785 Hyperlipidemia, unspecified: Secondary | ICD-10-CM

## 2015-08-26 DIAGNOSIS — E11319 Type 2 diabetes mellitus with unspecified diabetic retinopathy without macular edema: Secondary | ICD-10-CM | POA: Insufficient documentation

## 2015-08-26 DIAGNOSIS — I129 Hypertensive chronic kidney disease with stage 1 through stage 4 chronic kidney disease, or unspecified chronic kidney disease: Secondary | ICD-10-CM | POA: Insufficient documentation

## 2015-08-26 DIAGNOSIS — M7989 Other specified soft tissue disorders: Secondary | ICD-10-CM | POA: Insufficient documentation

## 2015-08-26 DIAGNOSIS — N184 Chronic kidney disease, stage 4 (severe): Secondary | ICD-10-CM | POA: Insufficient documentation

## 2015-08-26 LAB — COMPLETE METABOLIC PANEL WITH GFR
ALBUMIN: 3.4 g/dL — AB (ref 3.6–5.1)
ALK PHOS: 65 U/L (ref 40–115)
ALT: 14 U/L (ref 9–46)
AST: 14 U/L (ref 10–35)
BILIRUBIN TOTAL: 0.3 mg/dL (ref 0.2–1.2)
BUN: 53 mg/dL — AB (ref 7–25)
CO2: 17 mmol/L — ABNORMAL LOW (ref 20–31)
CREATININE: 3.88 mg/dL — AB (ref 0.70–1.33)
Calcium: 8.3 mg/dL — ABNORMAL LOW (ref 8.6–10.3)
Chloride: 113 mmol/L — ABNORMAL HIGH (ref 98–110)
GFR, Est African American: 20 mL/min — ABNORMAL LOW (ref >=60)
GFR, Est Non African American: 17 mL/min — ABNORMAL LOW (ref >=60)
GLUCOSE: 76 mg/dL (ref 65–99)
Potassium: 5.6 mmol/L — ABNORMAL HIGH (ref 3.5–5.3)
SODIUM: 141 mmol/L (ref 135–146)
TOTAL PROTEIN: 6.1 g/dL (ref 6.1–8.1)

## 2015-08-26 LAB — GLUCOSE, POCT (MANUAL RESULT ENTRY): POC Glucose: 55 mg/dl — AB (ref 70–99)

## 2015-08-26 LAB — AMMONIA: Ammonia: 35 umol/L (ref 16–53)

## 2015-08-26 MED ORDER — LACTULOSE 10 GM/15ML PO SOLN: 10.0000 g | Freq: Two times a day (BID) | ORAL | Status: DC | PRN

## 2015-08-26 MED FILL — LACTULOSE 10 GM/15 ML SOLN: 10 | 32 days supply | Qty: 946 | Fill #0

## 2015-08-26 NOTE — Progress Notes (Signed)
Patient complains of sob and pdeal edema His CBG 55 (given snack) patient been taking glyburide and glipizide instead of just the glipizide

## 2015-08-26 NOTE — Progress Notes (Signed)
Subjective:    Patient ID: Eric Glenn, male    DOB: 1965/09/15, 50 y.o.   MRN: [id number]  HPI He is a 50 year old male with a history of type 2 diabetes mellitus (A1c 7.6), hypertension, stage IV chronic kidney disease, hyperlipidemia who comes in today for follow visit. He had a hospitalization last month for hypertensive urgency and bronchitis. Cardiac enzymes done of the time were mildly elevated at 0.1, 0.11, 0.1 (which were thought to be secondary to demand ischemia), EKG was unremarkable, 2-D echo revealed EF of 50-55%, no regional wall motion abnormalities, mild aortic regurg, mildly dilated right atrium.  At his last office visit he was taken off metformin due to chronic kidney disease and placed on  glipizide 5 mg twice daily but was advised to take 2 of his glyburide 2.5mg  pills until he was done with that medication prior to commencing the new prescription of glipizide however it appears the patient has been taking both. He continues to complain of bilateral pedal edema and he does have some shortness of breath which he describes as "inability to get air in through his left nostril". He is currently on Flonase and an antihistamine with no improvement in symptoms. He is accompanied by his niece who complains of the patient has become more somnolent and woke up around 2 PM this afternoon and goes to bed around 9 PM and this has been the trend over the last few days. He is also constipated and moves his bowel every 2 days; denies a history of alcoholism. His blood pressure is in the accelerated range and he just took his antihypertensive prior to coming to the clinic today.  Past Medical History  Diagnosis Date  . Diabetes mellitus without complication ([place])   . Hypertension   . High cholesterol     Past Surgical History  Procedure Laterality Date  . Umbilical hernia repair      Social History   Social History  . Marital Status: Single    Spouse Name: N/A  .  Number of Children: N/A  . Years of Education: N/A   Occupational History  . Not on file.   Social History Main Topics  . Smoking status: Never Smoker   . Smokeless tobacco: Not on file  . Alcohol Use: No  . Drug Use: No  . Sexual Activity: Not on file   Other Topics Concern  . Not on file   Social History Narrative    Allergies  Allergen Reactions  . Pork-Derived Products Rash and Other (See Comments)    Raises blood pressure    Current Outpatient Prescriptions on File Prior to Visit  Medication Sig Dispense Refill  . amLODipine (NORVASC) 10 MG tablet Take 1 tablet (10 mg total) by mouth daily. 30 tablet 2  . Blood Glucose Monitoring Suppl (TRUE METRIX METER) DEVI 1 each by Does not apply route 3 (three) times daily before meals. 1 Device 0  . carvedilol (COREG) 25 MG tablet Take 1 tablet (25 mg total) by mouth 2 (two) times daily with a meal. 60 tablet 2  . cetirizine (ZYRTEC) 10 MG tablet Take 1 tablet (10 mg total) by mouth daily. 30 tablet 3  . fluticasone (FLONASE) 50 MCG/ACT nasal spray Place 1 spray into both nostrils daily. 16 g 2  . glipiZIDE (GLUCOTROL) 5 MG tablet Take 1 tablet (5 mg total) by mouth 2 (two) times daily before a meal. 60 tablet 2  . glucose blood (TRUE METRIX BLOOD GLUCOSE TEST)  test strip Use three times daily before meals 100 each 12  . loratadine (CLARITIN) 10 MG tablet Take 1 tablet (10 mg total) by mouth daily. 10 tablet 0  . Multiple Vitamin (MULTIVITAMIN WITH MINERALS) TABS tablet Take 1 tablet by mouth daily.    Marland Kitchen omega-3 acid ethyl esters (LOVAZA) 1 g capsule Take 1 capsule (1 g total) by mouth 2 (two) times daily. 60 capsule 1  . OVER THE COUNTER MEDICATION Place 1 spray into both nostrils daily as needed (congestion). Over the counter nasal spray    . pravastatin (PRAVACHOL) 40 MG tablet Take 1 tablet (40 mg total) by mouth daily at 6 PM. 30 tablet 2  . TRUEPLUS LANCETS 28G MISC 1 each by Does not apply route 3 (three) times daily before  meals. 100 each 12   No current facility-administered medications on file prior to visit.     Review of Systems Review of Systems  Constitutional: Negative for activity change and appetite change.  HENT: Positive for postnasal drip. Negative for sinus pressure and sore throat.   Eyes: Negative for visual disturbance.  Respiratory: Negative for cough, chest tightness and shortness of breath.   Cardiovascular: Positive for leg swelling. Negative for chest pain.  Gastrointestinal: Negative for abdominal pain, diarrhea, constipation and abdominal distention.  Endocrine: Negative.   Genitourinary: Negative for dysuria.  Musculoskeletal: Negative for myalgias and joint swelling.  Skin: Negative for rash.  Allergic/Immunologic: Negative.   Neurological: Negative for weakness, light-headedness and numbness.  Psychiatric/Behavioral: Negative for suicidal ideas and dysphoric mood, positive for somnolence       Objective: Filed Vitals:   08/26/15 1532 08/26/15 1623  BP: 199/103 160/90  Pulse: 64   Temp: 97.6 F (36.4 C)   Resp: 15   Height: 5' 6.5" (1.689 m)   Weight: 217 lb (98.431 kg)   SpO2: 96%       Physical Exam Constitutional: normal appearing,  HEENT: Head is atraumatic, normal sinuses, normal oropharynx, normal appearing tonsils and palate, tympanic membrane is normal bilaterally. Neck: normal range of motion, no thyromegaly, no JVD Cardiovascular: normal rate and rhythm, normal heart sounds, no murmurs, rub or gallop,  Bilateral 2+ pitting pedal edema Respiratory: clear to auscultation bilaterally, no wheezes, no rales, no rhonchi Abdomen: soft, not tender to palpation, normal bowel sounds, no enlarged organs Extremities: Full ROM, no tenderness in joints Skin: warm and dry, no lesions. Neurological: alert, oriented x3, cranial nerves I-XII grossly intact , normal motor strength, normal sensation. Psychological: normal mood.       Assessment & Plan:   1. Diabetic  retinopathy without macular edema associated with type 2 diabetes mellitus, unspecified retinopathy severity (HCC)  A1c of 7.6. CBG was 55 due to the fact that he was taking both glyburide and glipizide; he received a snack in the clinic Continue glipizide 5 mg twice a day. Scheduled to see clinical pharmacist for diabetic teaching.  Review blood sugar log at next office visit. - Glucose (CBG) - glipiZIDE (GLUCOTROL) 5 MG tablet; Take 1 tablet (5 mg total) by mouth 2 (two) times daily before a meal.  Dispense: 60 tablet; Refill: 2 - glucose blood (TRUE METRIX BLOOD GLUCOSE TEST) test strip; Use three times daily before meals  Dispense: 100 each; Refill: 12 - Blood Glucose Monitoring Suppl (TRUE METRIX METER) DEVI; 1 each by Does not apply route 3 (three) times daily before meals.  Dispense: 1 Device; Refill: 0 - TRUEPLUS LANCETS 28G MISC; 1 each by Does not  apply route 3 (three) times daily before meals.  Dispense: 100 each; Refill: 12  2. Hyperlipidemia   Will need lipid panel in the next visit - pravastatin (PRAVACHOL) 40 MG tablet; Take 1 tablet (40 mg total) by mouth daily at 6 PM.  Dispense: 30 tablet; Refill: 2  3. Uncontrolled type 2 diabetes mellitus with stage 4 chronic kidney disease, without long-term current use of insulin (HCC) Advised to avoid nephrotoxic agents   4. Essential hypertension  Uncontrolled. Initial blood pressure was in the accelerated range of 190/103 but repeat is lower at 160/90 - carvedilol (COREG) 25 MG tablet; Take 1 tablet (25 mg total) by mouth 2 (two) times daily with a meal.  Dispense: 60 tablet; Refill: 2 - amLODipine (NORVASC) 10 MG tablet; Take 1 tablet (10 mg total) by mouth daily.  Dispense: 30 tablet; Refill: 2  5. Chronic kidney disease, stage IV (severe) (HCC) Current somnolence could be secondary to uremia and so I am sending off a complete metabolic panel. Financial assistance staff called to expedite his application for the [place]  discounts so he can be referred to nephrology to nephrology for optimization of management.  6. Pedal edema Could be cardiac versus renal. EF of 50-55% from 2-D echo Will review his CMET and place on low-dose Lasix if renal function tolerates.   Advised to elevate feet, use compression stockings, low-sodium diet.  7. Chronic sinusitis  continue Flonase. Zyrtec added to regimen. He describes this as inability to get enough air through his left nostril. Symptoms are currently uncontrolled and he may benefit from seeing an ENT specialist which we are unable to place at this time given he has no [place] discounts  8. Constipation Placed on lactulose

## 2015-08-27 ENCOUNTER — Telehealth: Payer: Self-pay | Admitting: *Deleted

## 2015-08-27 ENCOUNTER — Other Ambulatory Visit: Payer: Self-pay | Admitting: Family Medicine

## 2015-08-27 DIAGNOSIS — R600 Localized edema: Secondary | ICD-10-CM

## 2015-08-27 MED ORDER — FUROSEMIDE 20 MG PO TABS: 20.0000 mg | ORAL_TABLET | Freq: Every day | ORAL | Status: DC

## 2015-08-27 NOTE — Telephone Encounter (Signed)
[id number] id interpreter  Interpreter left HIPAA compliant voicemail on both home and cell numbers that RN trying to reach him

## 2015-08-27 NOTE — Telephone Encounter (Signed)
-----   Message from Arnoldo Morale, MD sent at 08/27/2015  8:22 AM EST ----- His kidney function has worsened and potassium is elevated.; I have given him 20 mg of Lasix for his pedal edema and shortness of breath and would like to see him back in the clinic sooner-1 week rather than 2 weeks I had early given him.

## 2015-08-28 ENCOUNTER — Encounter (HOSPITAL_COMMUNITY): Payer: Self-pay

## 2015-08-28 ENCOUNTER — Emergency Department (HOSPITAL_COMMUNITY): Payer: Self-pay

## 2015-08-28 ENCOUNTER — Observation Stay (HOSPITAL_COMMUNITY): Payer: MEDICAID

## 2015-08-28 ENCOUNTER — Other Ambulatory Visit: Payer: Self-pay

## 2015-08-28 ENCOUNTER — Observation Stay (HOSPITAL_COMMUNITY)
Admission: EM | Admit: 2015-08-28 | Discharge: 2015-08-31 | Disposition: A | Discharge status: Home / Self Care | Payer: Self-pay | Attending: Internal Medicine | Admitting: Internal Medicine

## 2015-08-28 DIAGNOSIS — E11319 Type 2 diabetes mellitus with unspecified diabetic retinopathy without macular edema: Secondary | ICD-10-CM | POA: Diagnosis present

## 2015-08-28 DIAGNOSIS — R609 Edema, unspecified: Secondary | ICD-10-CM

## 2015-08-28 DIAGNOSIS — R600 Localized edema: Secondary | ICD-10-CM

## 2015-08-28 DIAGNOSIS — K5900 Constipation, unspecified: Secondary | ICD-10-CM | POA: Insufficient documentation

## 2015-08-28 DIAGNOSIS — R0609 Other forms of dyspnea: Secondary | ICD-10-CM

## 2015-08-28 DIAGNOSIS — R2243 Localized swelling, mass and lump, lower limb, bilateral: Secondary | ICD-10-CM | POA: Insufficient documentation

## 2015-08-28 DIAGNOSIS — R1084 Generalized abdominal pain: Secondary | ICD-10-CM | POA: Insufficient documentation

## 2015-08-28 DIAGNOSIS — Z79899 Other long term (current) drug therapy: Secondary | ICD-10-CM | POA: Insufficient documentation

## 2015-08-28 DIAGNOSIS — N189 Chronic kidney disease, unspecified: Secondary | ICD-10-CM

## 2015-08-28 DIAGNOSIS — E118 Type 2 diabetes mellitus with unspecified complications: Secondary | ICD-10-CM

## 2015-08-28 DIAGNOSIS — Z7984 Long term (current) use of oral hypoglycemic drugs: Secondary | ICD-10-CM | POA: Insufficient documentation

## 2015-08-28 DIAGNOSIS — E7800 Pure hypercholesterolemia, unspecified: Secondary | ICD-10-CM | POA: Insufficient documentation

## 2015-08-28 DIAGNOSIS — N184 Chronic kidney disease, stage 4 (severe): Secondary | ICD-10-CM | POA: Insufficient documentation

## 2015-08-28 DIAGNOSIS — I10 Essential (primary) hypertension: Secondary | ICD-10-CM | POA: Diagnosis present

## 2015-08-28 DIAGNOSIS — I129 Hypertensive chronic kidney disease with stage 1 through stage 4 chronic kidney disease, or unspecified chronic kidney disease: Secondary | ICD-10-CM | POA: Insufficient documentation

## 2015-08-28 DIAGNOSIS — D649 Anemia, unspecified: Secondary | ICD-10-CM | POA: Diagnosis present

## 2015-08-28 DIAGNOSIS — N289 Disorder of kidney and ureter, unspecified: Secondary | ICD-10-CM

## 2015-08-28 DIAGNOSIS — R0602 Shortness of breath: Principal | ICD-10-CM | POA: Insufficient documentation

## 2015-08-28 DIAGNOSIS — E785 Hyperlipidemia, unspecified: Secondary | ICD-10-CM | POA: Insufficient documentation

## 2015-08-28 DIAGNOSIS — J9601 Acute respiratory failure with hypoxia: Secondary | ICD-10-CM

## 2015-08-28 DIAGNOSIS — E119 Type 2 diabetes mellitus without complications: Secondary | ICD-10-CM | POA: Insufficient documentation

## 2015-08-28 DIAGNOSIS — E1169 Type 2 diabetes mellitus with other specified complication: Secondary | ICD-10-CM

## 2015-08-28 DIAGNOSIS — E1165 Type 2 diabetes mellitus with hyperglycemia: Secondary | ICD-10-CM

## 2015-08-28 LAB — I-STAT CHEM 8, ED
BUN: 42 mg/dL — ABNORMAL HIGH (ref 6–20)
CALCIUM ION: 1.21 mmol/L (ref 1.12–1.23)
CHLORIDE: 113 mmol/L — AB (ref 101–111)
Creatinine, Ser: 4.1 mg/dL — ABNORMAL HIGH (ref 0.61–1.24)
Glucose, Bld: 111 mg/dL — ABNORMAL HIGH (ref 65–99)
HCT: 28 % — ABNORMAL LOW (ref 39.0–52.0)
HEMOGLOBIN: 9.5 g/dL — AB (ref 13.0–17.0)
Potassium: 5.1 mmol/L (ref 3.5–5.1)
SODIUM: 142 mmol/L (ref 135–145)
TCO2: 18 mmol/L (ref 0–100)

## 2015-08-28 LAB — BASIC METABOLIC PANEL
Anion gap: 9 (ref 5–15)
BUN: 46 mg/dL — ABNORMAL HIGH (ref 6–20)
CHLORIDE: 113 mmol/L — AB (ref 101–111)
CO2: 18 mmol/L — ABNORMAL LOW (ref 22–32)
CREATININE: 4.06 mg/dL — AB (ref 0.61–1.24)
Calcium: 8.8 mg/dL — ABNORMAL LOW (ref 8.9–10.3)
GFR, EST AFRICAN AMERICAN: 18 mL/min — AB (ref >60)
GFR, EST NON AFRICAN AMERICAN: 16 mL/min — AB (ref >60)
Glucose, Bld: 117 mg/dL — ABNORMAL HIGH (ref 65–99)
POTASSIUM: 5.3 mmol/L — AB (ref 3.5–5.1)
SODIUM: 140 mmol/L (ref 135–145)

## 2015-08-28 LAB — URINALYSIS, ROUTINE W REFLEX MICROSCOPIC
BILIRUBIN URINE: NEGATIVE
Glucose, UA: 100 mg/dL — AB
KETONES UR: NEGATIVE mg/dL
Leukocytes, UA: NEGATIVE
NITRITE: NEGATIVE
SPECIFIC GRAVITY, URINE: 1.017 (ref 1.005–1.030)
pH: 5 (ref 5.0–8.0)

## 2015-08-28 LAB — GLUCOSE, CAPILLARY
GLUCOSE-CAPILLARY: 79 mg/dL (ref 65–99)
Glucose-Capillary: 71 mg/dL (ref 65–99)
Glucose-Capillary: 77 mg/dL (ref 65–99)

## 2015-08-28 LAB — IRON AND TIBC
Iron: 13 ug/dL — ABNORMAL LOW (ref 45–182)
Saturation Ratios: 5 % — ABNORMAL LOW (ref 17.9–39.5)
TIBC: 263 ug/dL (ref 250–450)
UIBC: 250 ug/dL

## 2015-08-28 LAB — URINE MICROSCOPIC-ADD ON

## 2015-08-28 LAB — FOLATE: Folate: 15.6 ng/mL (ref >5.9)

## 2015-08-28 LAB — I-STAT VENOUS BLOOD GAS, ED
ACID-BASE DEFICIT: 10 mmol/L — AB (ref 0.0–2.0)
BICARBONATE: 15.8 meq/L — AB (ref 20.0–24.0)
O2 SAT: 89 %
PCO2 VEN: 31.5 mmHg — AB (ref 45.0–50.0)
PO2 VEN: 62 mmHg — AB (ref 30.0–45.0)
TCO2: 17 mmol/L (ref 0–100)
pH, Ven: 7.307 — ABNORMAL HIGH (ref 7.250–7.300)

## 2015-08-28 LAB — VITAMIN B12: VITAMIN B 12: 510 pg/mL (ref 180–914)

## 2015-08-28 LAB — I-STAT TROPONIN, ED: Troponin i, poc: 0 ng/mL (ref 0.00–0.08)

## 2015-08-28 LAB — CBC
HEMATOCRIT: 27.5 % — AB (ref 39.0–52.0)
Hemoglobin: 9.6 g/dL — ABNORMAL LOW (ref 13.0–17.0)
MCH: 29.4 pg (ref 26.0–34.0)
MCHC: 34.9 g/dL (ref 30.0–36.0)
MCV: 84.1 fL (ref 78.0–100.0)
PLATELETS: 169 10*3/uL (ref 150–400)
RBC: 3.27 MIL/uL — AB (ref 4.22–5.81)
RDW: 13 % (ref 11.5–15.5)
WBC: 10.7 10*3/uL — AB (ref 4.0–10.5)

## 2015-08-28 LAB — INFLUENZA PANEL BY PCR (TYPE A & B)
H1N1 flu by pcr: NOT DETECTED
INFLAPCR: NEGATIVE
Influenza B By PCR: NEGATIVE

## 2015-08-28 LAB — RETICULOCYTES
RBC.: 3.08 MIL/uL — AB (ref 4.22–5.81)
RETIC COUNT ABSOLUTE: 37 10*3/uL (ref 19.0–186.0)
RETIC CT PCT: 1.2 % (ref 0.4–3.1)

## 2015-08-28 LAB — BRAIN NATRIURETIC PEPTIDE: B NATRIURETIC PEPTIDE 5: 229.4 pg/mL — AB (ref 0.0–100.0)

## 2015-08-28 LAB — TSH: TSH: 2.27 u[IU]/mL (ref 0.350–4.500)

## 2015-08-28 LAB — FERRITIN: Ferritin: 119 ng/mL (ref 24–336)

## 2015-08-28 MED ORDER — MORPHINE SULFATE (PF) 2 MG/ML IV SOLN
2.0000 mg | Freq: Once | INTRAVENOUS | Status: AC
  Administered 2015-08-28: 2 mg via INTRAVENOUS
  Filled 2015-08-28: qty 1

## 2015-08-28 MED ORDER — GLYCERIN (LAXATIVE) 2.1 G RE SUPP
1.0000 | Freq: Once | RECTAL | Status: AC
  Administered 2015-08-28: 1 via RECTAL
  Filled 2015-08-28: qty 1

## 2015-08-28 MED ORDER — CARVEDILOL 25 MG PO TABS
25.0000 mg | ORAL_TABLET | Freq: Two times a day (BID) | ORAL | Status: DC
  Administered 2015-08-28 – 2015-08-31 (×6): 25 mg via ORAL
  Filled 2015-08-28 (×6): qty 1

## 2015-08-28 MED ORDER — MAGNESIUM CITRATE PO SOLN
1.0000 | Freq: Once | ORAL | Status: AC
  Administered 2015-08-28: 1 via ORAL
  Filled 2015-08-28: qty 296

## 2015-08-28 MED ORDER — AMLODIPINE BESYLATE 10 MG PO TABS
10.0000 mg | ORAL_TABLET | Freq: Every day | ORAL | Status: DC
  Administered 2015-08-28 – 2015-08-31 (×4): 10 mg via ORAL
  Filled 2015-08-28 (×4): qty 1

## 2015-08-28 MED ORDER — LORATADINE 10 MG PO TABS
10.0000 mg | ORAL_TABLET | Freq: Every day | ORAL | Status: DC
  Administered 2015-08-28 – 2015-08-31 (×4): 10 mg via ORAL
  Filled 2015-08-28 (×4): qty 1

## 2015-08-28 MED ORDER — ACETAMINOPHEN 650 MG RE SUPP: 650.0000 mg | Freq: Four times a day (QID) | RECTAL | Status: DC | PRN

## 2015-08-28 MED ORDER — ENOXAPARIN SODIUM 30 MG/0.3ML ~~LOC~~ SOLN
30.0000 mg | SUBCUTANEOUS | Status: DC
  Administered 2015-08-28 – 2015-08-30 (×3): 30 mg via SUBCUTANEOUS
  Filled 2015-08-28 (×3): qty 0.3

## 2015-08-28 MED ORDER — INSULIN ASPART 100 UNIT/ML ~~LOC~~ SOLN
0.0000 [IU] | Freq: Three times a day (TID) | SUBCUTANEOUS | Status: DC
  Administered 2015-08-30 – 2015-08-31 (×3): 1 [IU] via SUBCUTANEOUS

## 2015-08-28 MED ORDER — DOCUSATE SODIUM 100 MG PO CAPS
100.0000 mg | ORAL_CAPSULE | Freq: Two times a day (BID) | ORAL | Status: DC
Start: 2015-08-28 — End: 2015-08-29
  Administered 2015-08-28 – 2015-08-29 (×3): 100 mg via ORAL
  Filled 2015-08-28 (×3): qty 1

## 2015-08-28 MED ORDER — FUROSEMIDE 10 MG/ML IJ SOLN
40.0000 mg | Freq: Once | INTRAMUSCULAR | Status: AC
  Administered 2015-08-28: 40 mg via INTRAVENOUS
  Filled 2015-08-28: qty 4

## 2015-08-28 MED ORDER — ACETAMINOPHEN 325 MG PO TABS: 650.0000 mg | ORAL_TABLET | Freq: Four times a day (QID) | ORAL | Status: DC | PRN

## 2015-08-28 MED ORDER — OMEGA-3-ACID ETHYL ESTERS 1 G PO CAPS
1.0000 g | ORAL_CAPSULE | Freq: Two times a day (BID) | ORAL | Status: DC
  Administered 2015-08-28 – 2015-08-31 (×6): 1 g via ORAL
  Filled 2015-08-28 (×6): qty 1

## 2015-08-28 MED ORDER — INSULIN ASPART 100 UNIT/ML ~~LOC~~ SOLN: 0.0000 [IU] | Freq: Every day | SUBCUTANEOUS | Status: DC

## 2015-08-28 MED ORDER — PRAVASTATIN SODIUM 40 MG PO TABS
40.0000 mg | ORAL_TABLET | Freq: Every day | ORAL | Status: DC
  Administered 2015-08-28 – 2015-08-30 (×3): 40 mg via ORAL
  Filled 2015-08-28 (×3): qty 1

## 2015-08-28 NOTE — ED Provider Notes (Signed)
CSN: [id number]     Arrival date & time 08/28/15  [id number] History   First MD Initiated Contact with Patient 08/28/15 [id number]     Chief Complaint  Patient presents with  . Shortness of Breath   (Consider location/radiation/quality/duration/timing/severity/associated sxs/prior Treatment) Patient is a 50 y.o. male presenting with shortness of breath. The history is provided by the patient. No language interpreter was used (neice was used as interpreter).  Shortness of Breath Associated symptoms: no fever and no vomiting     Ms. [name] is a 50 y.o male with a past medical history of stage IV chronic renal disease, diabetes, hypertension, and hyperlipidemia who presents with shortness of breath that began last night. He is unable to lie flat and shortness of breath is worse with exertion. He is not on dialysis. He is also complaining of abdominal bloating for the past couple of days. He's been taking lactulose to help him with bowel movements. Last bowel movement was yesterday. He also reports swelling of bilateral lower extremities. Denies any fever, chills, night sweats, chest pain, wheezing, nausea, vomiting, diarrhea, dysuria.   Past Medical History  Diagnosis Date  . Diabetes mellitus without complication ([place])   . Hypertension   . High cholesterol    Past Surgical History  Procedure Laterality Date  . Umbilical hernia repair     Family History  Problem Relation Age of Onset  . Hypertension Mother   . Hyperlipidemia Mother   . Diabetes Mellitus II Sister    Social History  Substance Use Topics  . Smoking status: Never Smoker   . Smokeless tobacco: None  . Alcohol Use: No    Review of Systems  Constitutional: Negative for fever and chills.  Respiratory: Positive for shortness of breath.   Gastrointestinal: Positive for abdominal distention. Negative for nausea and vomiting.  All other systems reviewed and are negative.     Allergies  Pork-derived products  Home  Medications   Prior to Admission medications   Medication Sig Start Date End Date Taking? Authorizing Provider  amLODipine (NORVASC) 10 MG tablet Take 1 tablet (10 mg total) by mouth daily. 08/19/15  Yes Arnoldo Morale, MD  carvedilol (COREG) 25 MG tablet Take 1 tablet (25 mg total) by mouth 2 (two) times daily with a meal. 08/19/15  Yes Arnoldo Morale, MD  glipiZIDE (GLUCOTROL) 5 MG tablet Take 1 tablet (5 mg total) by mouth 2 (two) times daily before a meal. 08/19/15  Yes Arnoldo Morale, MD  lactulose (CHRONULAC) 10 GM/15ML solution Take 15 mLs (10 g total) by mouth 2 (two) times daily as needed for mild constipation. 08/26/15  Yes Arnoldo Morale, MD  loratadine (CLARITIN) 10 MG tablet Take 1 tablet (10 mg total) by mouth daily. 08/06/15  Yes Hosie Poisson, MD  omega-3 acid ethyl esters (LOVAZA) 1 g capsule Take 1 capsule (1 g total) by mouth 2 (two) times daily. 08/06/15  Yes Hosie Poisson, MD  OVER THE COUNTER MEDICATION Place 1 spray into both nostrils daily as needed (congestion). Over the counter nasal spray   Yes Historical Provider, MD  pravastatin (PRAVACHOL) 40 MG tablet Take 1 tablet (40 mg total) by mouth daily at 6 PM. 08/19/15  Yes Arnoldo Morale, MD  Blood Glucose Monitoring Suppl (TRUE METRIX METER) DEVI 1 each by Does not apply route 3 (three) times daily before meals. 08/19/15   Arnoldo Morale, MD  cetirizine (ZYRTEC) 10 MG tablet Take 1 tablet (10 mg total) by mouth daily. Patient not taking: Reported on  08/28/2015 08/19/15   Arnoldo Morale, MD  fluticasone (FLONASE) 50 MCG/ACT nasal spray Place 1 spray into both nostrils daily. Patient not taking: Reported on 08/28/2015 08/06/15   Hosie Poisson, MD  furosemide (LASIX) 20 MG tablet Take 1 tablet (20 mg total) by mouth daily. 08/27/15   Arnoldo Morale, MD  glucose blood (TRUE METRIX BLOOD GLUCOSE TEST) test strip Use three times daily before meals 08/19/15   Arnoldo Morale, MD  TRUEPLUS LANCETS 28G MISC 1 each by Does not apply route 3 (three) times daily before  meals. 08/19/15   Arnoldo Morale, MD   BP 163/92 mmHg  Pulse 74  Temp(Src) 99.3 F (37.4 C) (Oral)  Resp 19  Ht 5\' 1"  (1.549 m)  Wt 94.7 kg  BMI 39.47 kg/m2  SpO2 94% Physical Exam  Constitutional: He is oriented to person, place, and time. He appears well-developed and well-nourished.  HENT:  Head: Normocephalic and atraumatic.  Eyes: Conjunctivae are normal.  Neck: Normal range of motion. Neck supple.  Cardiovascular: Normal rate, regular rhythm and normal heart sounds.   Pulmonary/Chest: Effort normal. He has no wheezes.  Lungs clear to auscultation bilaterally. No obvious rhonchi. No respiratory distress or use of accessory muscles. 96% oxygen on room air.  Abdominal: Soft. He exhibits distension. There is generalized tenderness. There is no rebound, no guarding and no CVA tenderness.  Mild abdominal distention. No guarding or rebound. Obese. No CVA tenderness.  Musculoskeletal: Normal range of motion.  1+ bilateral lower extremity pitting edema.   Neurological: He is alert and oriented to person, place, and time.  Skin: Skin is warm and dry.  Psychiatric: He has a normal mood and affect.  Nursing note and vitals reviewed.   ED Course  Procedures (including critical care time) Labs Review Labs Reviewed  BASIC METABOLIC PANEL - Abnormal; Notable for the following:    Potassium 5.3 (*)    Chloride 113 (*)    CO2 18 (*)    Glucose, Bld 117 (*)    BUN 46 (*)    Creatinine, Ser 4.06 (*)    Calcium 8.8 (*)    GFR calc non Af Amer 16 (*)    GFR calc Af Amer 18 (*)    All other components within normal limits  CBC - Abnormal; Notable for the following:    WBC 10.7 (*)    RBC 3.27 (*)    Hemoglobin 9.6 (*)    HCT 27.5 (*)    All other components within normal limits  BRAIN NATRIURETIC PEPTIDE - Abnormal; Notable for the following:    B Natriuretic Peptide 229.4 (*)    All other components within normal limits  URINALYSIS, ROUTINE W REFLEX MICROSCOPIC (NOT AT [facility]) -  Abnormal; Notable for the following:    APPearance CLOUDY (*)    Glucose, UA 100 (*)    Hgb urine dipstick MODERATE (*)    Protein, ur >300 (*)    All other components within normal limits  URINE MICROSCOPIC-ADD ON - Abnormal; Notable for the following:    Squamous Epithelial / LPF 0-5 (*)    Bacteria, UA FEW (*)    Casts GRANULAR CAST (*)    All other components within normal limits  I-STAT CHEM 8, ED - Abnormal; Notable for the following:    Chloride 113 (*)    BUN 42 (*)    Creatinine, Ser 4.10 (*)    Glucose, Bld 111 (*)    Hemoglobin 9.5 (*)    HCT  28.0 (*)    All other components within normal limits  I-STAT VENOUS BLOOD GAS, ED - Abnormal; Notable for the following:    pH, Ven 7.307 (*)    pCO2, Ven 31.5 (*)    pO2, Ven 62.0 (*)    Bicarbonate 15.8 (*)    Acid-base deficit 10.0 (*)    All other components within normal limits  RESPIRATORY VIRUS PANEL  VITAMIN B12  FOLATE  IRON AND TIBC  FERRITIN  RETICULOCYTES  TSH  INFLUENZA PANEL BY PCR (TYPE A & B, H1N1)  I-STAT TROPOININ, ED    Imaging Review Dg Chest 2 View  08/28/2015  CLINICAL DATA:  Acute onset of shortness of breath and mid chest pain. Initial encounter. EXAM: CHEST  2 VIEW COMPARISON:  Chest radiograph performed 08/03/2015 FINDINGS: The lungs are well-aerated. Vascular congestion is noted. Increased interstitial markings raise concern for mild interstitial edema. There is no evidence of pleural effusion or pneumothorax. The heart is borderline normal in size. No acute osseous abnormalities are seen. IMPRESSION: Vascular congestion noted. Increased interstitial markings raise concern for mild interstitial edema. Electronically Signed   By: Garald Balding M.D.   On: 08/28/2015 05:50   Dg Abd 1 View  08/28/2015  CLINICAL DATA:  Abdominal bloating EXAM: ABDOMEN - 1 VIEW COMPARISON:  None. FINDINGS: There is moderate stool throughout colon. There is no bowel dilatation or air-fluid level suggesting obstruction. No  free air is seen on this supine examination. There are small phleboliths in the pelvis. IMPRESSION: No demonstrable obstruction or free air.  Moderate stool in colon. Electronically Signed   By: Lowella Grip III M.D.   On: 08/28/2015 08:19   I have personally reviewed and evaluated these images and lab results as part of my medical decision-making.   EKG Interpretation None      MDM   Final diagnoses:  Bilateral edema of lower extremity  Interstitial edema  Shortness of breath  Constipation, unspecified constipation type   Patient presents for increased shortness of breath with exertion and lying flat. Reports he could not sleep last night. He is also complaining of abdominal bloating. He has had problems with constipation in the past and has been taking lactulose with minimal relief. He did have a bowel movement yesterday. He has chronic kidney disease as well as multiple other problems. He is not on dialysis and has follow-up with nephrology. He appears uncomfortable on exam and is clearly short of breath. His hemoglobin is stable. Potassium is 5.3. He has an increased creatinine from previous visit with his PCP at 4.1. His abdominal x-ray shows moderate stool in the colon but no obstruction. Chest x-ray shows vascular congestion and increased interstitial markings. He was given 40mg  of IV lasix after speaking with the hospitalist. Patient does not seem better. He was also given a suppository.  I discussed this patient with Dr. Johnney Killian who has seen and evaluated the patient.  She spoke with the hospitalist regarding admission for interstitial edema which may be causing his sob.  Vitals remain stable.     Ottie Glazier, PA-C 08/28/15 1438  Charlesetta Shanks, MD 08/29/15 [phone]

## 2015-08-28 NOTE — Telephone Encounter (Signed)
-----   Message from Arnoldo Morale, MD sent at 08/27/2015  8:22 AM EST ----- His kidney function has worsened and potassium is elevated.; I have given him 20 mg of Lasix for his pedal edema and shortness of breath and would like to see him back in the clinic sooner-1 week rather than 2 weeks I had early given him.

## 2015-08-28 NOTE — Progress Notes (Signed)
Patient arrived on unit via stretcher from the ED.  Daughter at bedside.

## 2015-08-28 NOTE — Telephone Encounter (Signed)
[place] id # [phone]  Interpreter left HIPAA compliant voicemail on both home and cell numbers that RN trying to reach him

## 2015-08-28 NOTE — Progress Notes (Signed)
Pt was bought to [place]. Attempted scan but pt was unable to lay flat. Retry again tomorrow,  2/10 am

## 2015-08-28 NOTE — H&P (Signed)
[facility] History and Physical                                                                                    Eric Glenn, is a 50 y.o. male  MRN: [id number]   DOB - Sep 24, 1965  Admit Date - 08/28/2015  Outpatient Primary MD for the patient is Arnoldo Morale, MD  Referring MD: Johnney Killian / ER  PMH: Past Medical History  Diagnosis Date  . Diabetes mellitus without complication ([place])   . Hypertension   . High cholesterol       PSH: Past Surgical History  Procedure Laterality Date  . Umbilical hernia repair       CC:  Chief Complaint  Patient presents with  . Shortness of Breath     HPI: 50 year old male patient recently discharged on 1/18 after admission for chest pain related to hypertensive urgency. Ischemic evaluation was unremarkable with essentially normal echocardiogram except for mild LVH without associated systolic dysfunction and only mild aortic regurgitation. During that admission patient was noted to have chronic kidney disease stage IV without evidence of obstructive uropathy on renal ultrasound. He also had symptoms consistent with chronic sinusitis and had been using over-the-counter nasal decongestant for 3 months and appear to be having rebound rhinorrhea. Her to admission patient had been on no dose of enalapril so this was not resumed. He also had been on metformin which was discontinued in favor of Glucotrol. He was discharged on two new medications (carvedilol and Norvasc) as well as Lasix. He has subsequently followed up with his primary care physician and due to worsening renal function (noting BUN had increased from 29 to 53 since discharge with stable creatinine) his Lasix was discontinued.  Patient returns to the ER with reports of dyspnea on exertion and abdominal distention. Patient primarily reports issues with chronic difficulty breathing through the left nostril especially at night causing him to sleep on his right side. According  to the family who is translating for the patient he has been short of breath for 3-4 months. He denies abdominal pain or difficulty passing bowel movements stating had a bowel movement yesterday. On a stable pedal edema. Patient is not sleeping at night because he is having apneic episodes which is suggestive of sleep apnea and he is frightened he will stop breathing and died during the night. He typically only sleeps about 2-3 hours per night. He has known diabetic retinopathy and apparently has been referred to an ophthalmologist for treatment and evaluation. Patient reports that respiratory symptoms haven't changed since discharge and no new symptoms such as cough fevers or chills at home.  ER Evaluation and treatment: Low-grade temperature 99.3 in the ER, BP 160/92, pulse 78 respirations 22, room air saturations 98%. 2 View CXR: Chronic stable vascular congestion and mild interstitial edema but unchanged from previous chest x-ray Abd XR: No free air or obstruction but moderate stool in colon most notable extending from cecum to transverse colon with retained stool in sigmoid colon EKG: Sinus rhythm with ventricular rate 70 bpm, QTC 419 ms elevated J-point lateral leads without ischemic changes and unchanged from previous Laboratory data: Sodium 142 potassium 5.1,  chloride 113, BUN 42 and creatinine 4.1, BNP 229, troponin 0.00, WBC 10,700 with hemoglobin 9.6, platelets 169,000, glucose 117, abnormal urinalysis with cloudy appearance, few bacteria, granular cast, 100 of glucose, moderate hemoglobin, 8 of nitrite, greater than 300 of protein, wbc's 0-5 Morphine 2 mg IV 1 Glycerin suppository X 1 Lasix 40 mg IV 1   Review of Systems   In addition to the HPI above,  No Fever-chills, myalgias or other constitutional symptoms No Headache, changes with Vision or hearing, new weakness, tingling, numbness in any extremity, No problems swallowing food or Liquids, indigestion/reflux No Chest pain, Cough,  palpitations, orthopnea  No Abdominal pain, N/V; no melena or hematochezia, no dark tarry stools No dysuria, hematuria or flank pain No new skin Lesions, masses or bruises, No new joints pains-aches No recent weight gain or loss No polyuria, polydypsia or polyphagia,  *A full 10 point Review of Systems was done, except as stated above, all other Review of Systems were negative.  Social History Social History  Substance Use Topics  . Smoking status: Never Smoker   . Smokeless tobacco: Not on file  . Alcohol Use: No    Resides at: Private residence  Lives with: Daughters  Ambulatory status: Without assistive devices   Family History Family History  Problem Relation Age of Onset  . Hypertension Mother   . Hyperlipidemia Mother   . Diabetes Mellitus II Sister      Prior to Admission medications   Medication Sig Start Date End Date Taking? Authorizing Provider  amLODipine (NORVASC) 10 MG tablet Take 1 tablet (10 mg total) by mouth daily. 08/19/15  Yes Arnoldo Morale, MD  carvedilol (COREG) 25 MG tablet Take 1 tablet (25 mg total) by mouth 2 (two) times daily with a meal. 08/19/15  Yes Arnoldo Morale, MD  glipiZIDE (GLUCOTROL) 5 MG tablet Take 1 tablet (5 mg total) by mouth 2 (two) times daily before a meal. 08/19/15  Yes Arnoldo Morale, MD  lactulose (CHRONULAC) 10 GM/15ML solution Take 15 mLs (10 g total) by mouth 2 (two) times daily as needed for mild constipation. 08/26/15  Yes Arnoldo Morale, MD  loratadine (CLARITIN) 10 MG tablet Take 1 tablet (10 mg total) by mouth daily. 08/06/15  Yes Hosie Poisson, MD  omega-3 acid ethyl esters (LOVAZA) 1 g capsule Take 1 capsule (1 g total) by mouth 2 (two) times daily. 08/06/15  Yes Hosie Poisson, MD  OVER THE COUNTER MEDICATION Place 1 spray into both nostrils daily as needed (congestion). Over the counter nasal spray   Yes Historical Provider, MD  pravastatin (PRAVACHOL) 40 MG tablet Take 1 tablet (40 mg total) by mouth daily at 6 PM. 08/19/15  Yes  Arnoldo Morale, MD  Blood Glucose Monitoring Suppl (TRUE METRIX METER) DEVI 1 each by Does not apply route 3 (three) times daily before meals. 08/19/15   Arnoldo Morale, MD  cetirizine (ZYRTEC) 10 MG tablet Take 1 tablet (10 mg total) by mouth daily. Patient not taking: Reported on 08/28/2015 08/19/15   Arnoldo Morale, MD  fluticasone (FLONASE) 50 MCG/ACT nasal spray Place 1 spray into both nostrils daily. Patient not taking: Reported on 08/28/2015 08/06/15   Hosie Poisson, MD  furosemide (LASIX) 20 MG tablet Take 1 tablet (20 mg total) by mouth daily. 08/27/15   Arnoldo Morale, MD  glucose blood (TRUE METRIX BLOOD GLUCOSE TEST) test strip Use three times daily before meals 08/19/15   Arnoldo Morale, MD  TRUEPLUS LANCETS 28G MISC 1 each by Does not apply  route 3 (three) times daily before meals. 08/19/15   Arnoldo Morale, MD    Allergies  Allergen Reactions  . Pork-Derived Products Rash and Other (See Comments)    Raises blood pressure    Physical Exam  Vitals  Blood pressure 161/80, pulse 71, temperature 99.3 F (37.4 C), temperature source Oral, resp. rate 15, height 5\' 6"  (1.676 m), SpO2 96 %.   General:  In no acute distress, appears healthy and well nourished but somewhat anxious  Psych:  Normal affect, requires translator for conversation, Awake Alert, Oriented X 3. Speech and thought patterns are clear and appropriate, no apparent short term memory deficits  Neuro:   No focal neurological deficits, CN II through XII intact, Strength 5/5 all 4 extremities, Sensation intact all 4 extremities.  ENT:  Ears appear Normal, bilateral inner canthus pterygium, Conjunctivae clear, PER. Moist oral mucosa without erythema or exudates.  Neck:  Supple, No lymphadenopathy appreciated  Respiratory:  Symmetrical chest wall movement, Good air movement bilaterally, bilateral fine expiratory crackles primarily in bases. Room Air  Cardiac:  RRR, No Murmurs, no LE edema noted, no JVD, No carotid bruits, peripheral  pulses palpable at 2+  Abdomen:  Positive bowel sounds, Soft, Non tender, somewhat distended,  No masses appreciated, no obvious hepatosplenomegaly  Skin:  No Cyanosis, Normal Skin Turgor, No Bruise. Evidence of fine maculopapular rash bilateral lower extremities below knees with evidence of excoriations that appear consistent with scratching from patient  Extremities: Symmetrical without obvious trauma or injury,  no effusions.  Data Review  CBC  Recent Labs Lab 08/28/15 0547 08/28/15 0553  WBC 10.7*  --   HGB 9.6* 9.5*  HCT 27.5* 28.0*  PLT 169  --   MCV 84.1  --   MCH 29.4  --   MCHC 34.9  --   RDW 13.0  --     Chemistries   Recent Labs Lab 08/26/15 1557 08/28/15 0547 08/28/15 0553  NA 141 140 142  K 5.6* 5.3* 5.1  CL 113* 113* 113*  CO2 17* 18*  --   GLUCOSE 76 117* 111*  BUN 53* 46* 42*  CREATININE 3.88* 4.06* 4.10*  CALCIUM 8.3* 8.8*  --   AST 14  --   --   ALT 14  --   --   ALKPHOS 65  --   --   BILITOT 0.3  --   --     estimated creatinine clearance is 23.7 mL/min (by C-G formula based on Cr of 4.1).  No results for input(s): TSH, T4TOTAL, T3FREE, THYROIDAB in the last 72 hours.  Invalid input(s): FREET3  Coagulation profile No results for input(s): INR, PROTIME in the last 168 hours.  No results for input(s): DDIMER in the last 72 hours.  Cardiac Enzymes No results for input(s): CKMB, TROPONINI, MYOGLOBIN in the last 168 hours.  Invalid input(s): CK  Invalid input(s): POCBNP  Urinalysis    Component Value Date/Time   COLORURINE YELLOW 08/28/2015 0851   APPEARANCEUR CLOUDY* 08/28/2015 0851   LABSPEC 1.017 08/28/2015 0851   PHURINE 5.0 08/28/2015 0851   GLUCOSEU 100* 08/28/2015 0851   HGBUR MODERATE* 08/28/2015 0851   BILIRUBINUR NEGATIVE 08/28/2015 0851   KETONESUR NEGATIVE 08/28/2015 0851   PROTEINUR >300* 08/28/2015 0851   UROBILINOGEN 0.2 05/15/2007 2237   NITRITE NEGATIVE 08/28/2015 0851   LEUKOCYTESUR NEGATIVE 08/28/2015 0851     Imaging results:   Dg Chest 2 View  08/28/2015  CLINICAL DATA:  Acute onset of shortness of  breath and mid chest pain. Initial encounter. EXAM: CHEST  2 VIEW COMPARISON:  Chest radiograph performed 08/03/2015 FINDINGS: The lungs are well-aerated. Vascular congestion is noted. Increased interstitial markings raise concern for mild interstitial edema. There is no evidence of pleural effusion or pneumothorax. The heart is borderline normal in size. No acute osseous abnormalities are seen. IMPRESSION: Vascular congestion noted. Increased interstitial markings raise concern for mild interstitial edema. Electronically Signed   By: Garald Balding M.D.   On: 08/28/2015 05:50   Dg Chest 2 View  08/03/2015  CLINICAL DATA:  Chest pain with nasal congestion and cough for 3 weeks EXAM: CHEST  2 VIEW COMPARISON:  May 15, 2007 FINDINGS: There is no edema or consolidation. The heart size and pulmonary vascularity are normal. No adenopathy. No bone lesions. No pneumothorax. IMPRESSION: No edema or consolidation. Electronically Signed   By: Lowella Grip III M.D.   On: 08/03/2015 17:47   Dg Abd 1 View  08/28/2015  CLINICAL DATA:  Abdominal bloating EXAM: ABDOMEN - 1 VIEW COMPARISON:  None. FINDINGS: There is moderate stool throughout colon. There is no bowel dilatation or air-fluid level suggesting obstruction. No free air is seen on this supine examination. There are small phleboliths in the pelvis. IMPRESSION: No demonstrable obstruction or free air.  Moderate stool in colon. Electronically Signed   By: Lowella Grip III M.D.   On: 08/28/2015 08:19   Ct Head Wo Contrast  08/03/2015  CLINICAL DATA:  50 year old male with headaches and hypertension. EXAM: CT HEAD WITHOUT CONTRAST TECHNIQUE: Contiguous axial images were obtained from the base of the skull through the vertex without intravenous contrast. COMPARISON:  None. FINDINGS: The ventricles and sulci are appropriate in size for patient's age. Minimal  periventricular and deep white matter hypodensities represent chronic microvascular ischemic changes. There is no intracranial hemorrhage. No mass effect or midline shift identified. The visualized paranasal sinuses and mastoid air cells are well aerated. The calvarium is intact. IMPRESSION: No acute intracranial pathology. Electronically Signed   By: Anner Crete M.D.   On: 08/03/2015 23:23   US Renal  08/04/2015  CLINICAL DATA:  Chronic kidney disease EXAM: RENAL / URINARY TRACT ULTRASOUND COMPLETE COMPARISON:  None. FINDINGS: Right Kidney: Length: 14.6 cm. Echogenicity within normal limits. No mass or hydronephrosis visualized. Left Kidney: Length: 13.9 cm. Echogenicity within normal limits. There is a 1.5 x 1.1 x 1.3 cm anechoic left renal mass most consistent with a cyst. No hydronephrosis visualized. Bladder: Appears normal for degree of bladder distention. IMPRESSION: 1. No obstructive uropathy. Electronically Signed   By: Kathreen Devoid   On: 08/04/2015 12:06     EKG: (Independently reviewed)  Sinus rhythm with ventricular rate 70 bpm, QTC 419 ms elevated J-point lateral leads without ischemic changes and unchanged from previous   Assessment & Plan  Principal Problem:   DOE -Does not appear to be consistent with heart failure noting echocardiogram completed last admission menstruated preserved LV function and normal ventricular diastolic function parameters, only mild aortic regurgitation, normal RV and no apparent pulmonary hypertension -I suspect his mildly elevated BNP is more reflective of the severity of his chronic kidney disease -Admit to floor/Obs -Patient does have bilateral crackles on exam and may have underlying idiopathic lung disease so we'll check noncontrasted CT of the chest; if any abnormalities recommend consult pulmonary medicine for further evaluation -Check room air ambulatory pulse oximetry to determine if patient desaturates with activity-currently stable on room  air -Patient does have low-grade fevers  and may have a new viral illness so check influenza PCR since recently hospitalized as well as respiratory viral panel -Patient primarily complains of difficulty breathing through his left nostril especially when positioned on left side during sleep; according to H&P from last admission patient had been abusing nasal decongestants for a long period of time and patient may only have severe chronic sinusitis with rebound rhinorrhea but will check CT of the sinuses to rule out obstructing lesion given chronicity of problems  Active Problems:   Acute renal insufficiency/Proteinuria/Chronic kidney disease, stage IV (severe)  -Renal function has worsened since discharge in setting of diuretics prompting PCP to discontinue Lasix -EDP concerned over atypical presentation of heart failure so was given Lasix 40 mg IV in ER -Follow electrolytes in a.m. 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 and may eventually require dialysis    Suspected OSA  -Patient reports has been stopping breathing with sleep and requests family to wake him up due to fear of dying in his sleep -Family reports patient sleeping only 2-3 hours a night with some daytime napping but has not been formally evaluated for sleep apnea -Patient reassured that sleep apnea is a common problem that is easily treated once diagnosed and that the apnea is only transient in nature with the body resuming automatic breathing during sleep -Continuous oximetry and monitor for possible desaturations while sleeping -Likely needs outpatient PSG-case management consulted    Essential hypertension -Moderate control -Continue preadmission Norvasc and carvedilol -Previous ACE inhibitor discontinued    Diabetes mellitus type 2, controlled -Hemoglobin A1c was 7.6 last admission -Metformin was  discontinued in setting of severe chronic kidney disease -Glucotrol initiated during previous admission but given severity of chronic kidney disease need to monitor closely for hypoglycemia -Follow CBGs/provide SSI    Normocytic anemia -Likely related to underlying chronic kidney disease -Check TSH and anemia panel -Patient may require regular erythropoietin injections    Constipation -Significant stool burden right colon and this likely accounts for patient's abdominal distention -Apparently on lactulose as needed at home -Given one bottle magnesium citrate and begin twice a day Colace; may need to add MiraLAX as well    Diabetic retinopathy associated with type 2 diabetes mellitus -PCP has apparently scheduled with ophthalmologist -Has bilateral pterygium     Hyperlipidemia -Continue Pravachol-apparently was initiated during previous encounter -TG 414 with HDL 25 and unable to calculate LDL -Follow-up lipid panel at discretion of PCP after discharge    DVT Prophylaxis: Lovenox renal dose adjusted  Family Communication:   Daughters at bedside  Code Status:  Full code  Condition:   Stable  Discharge disposition: Anticipate discharge in a.m. back to home environment  Time spent in minutes : 60      ELLIS,ALLISON L. ANP on 08/28/2015 at 1:16 PM  You may contact me by going to [email or web address] - password [email or web address]  I am available from 7a-7p but please confirm I am on the schedule by going to Amion as above.   After 7p please contact night coverage person covering me after hours  [facility]

## 2015-08-28 NOTE — Telephone Encounter (Signed)
Erin Hearing, ED nurse practitioner called to let Dr. Jarold Song know that patient was in the emergency room for sob and was being admitted for workup with probably discharge in the morning.  She had seen that RN here was trying to contact patient and wanted to "keep [name] in the loop."  Call very much appreciated and will let Dr. Jarold Song know and will follow patient's progress in Epic.

## 2015-08-28 NOTE — ED Notes (Signed)
Pt here with daughter for SOB that started last night. Also feels like his stomach is swollen and had a BM yesterday.

## 2015-08-28 NOTE — Progress Notes (Signed)
Called ED for report, spoke with [facility].

## 2015-08-29 ENCOUNTER — Observation Stay (HOSPITAL_COMMUNITY): Payer: Self-pay

## 2015-08-29 DIAGNOSIS — R0602 Shortness of breath: Secondary | ICD-10-CM

## 2015-08-29 DIAGNOSIS — R0609 Other forms of dyspnea: Secondary | ICD-10-CM

## 2015-08-29 DIAGNOSIS — J9601 Acute respiratory failure with hypoxia: Secondary | ICD-10-CM

## 2015-08-29 DIAGNOSIS — I10 Essential (primary) hypertension: Secondary | ICD-10-CM

## 2015-08-29 DIAGNOSIS — R0601 Orthopnea: Secondary | ICD-10-CM

## 2015-08-29 DIAGNOSIS — E785 Hyperlipidemia, unspecified: Secondary | ICD-10-CM

## 2015-08-29 DIAGNOSIS — N184 Chronic kidney disease, stage 4 (severe): Secondary | ICD-10-CM

## 2015-08-29 LAB — BASIC METABOLIC PANEL
Anion gap: 8 (ref 5–15)
BUN: 49 mg/dL — AB (ref 6–20)
CALCIUM: 8.8 mg/dL — AB (ref 8.9–10.3)
CO2: 20 mmol/L — AB (ref 22–32)
CREATININE: 4.35 mg/dL — AB (ref 0.61–1.24)
Chloride: 113 mmol/L — ABNORMAL HIGH (ref 101–111)
GFR calc non Af Amer: 15 mL/min — ABNORMAL LOW (ref >60)
GFR, EST AFRICAN AMERICAN: 17 mL/min — AB (ref >60)
Glucose, Bld: 87 mg/dL (ref 65–99)
Potassium: 4.9 mmol/L (ref 3.5–5.1)
Sodium: 141 mmol/L (ref 135–145)

## 2015-08-29 LAB — GLUCOSE, CAPILLARY
GLUCOSE-CAPILLARY: 82 mg/dL (ref 65–99)
Glucose-Capillary: 112 mg/dL — ABNORMAL HIGH (ref 65–99)
Glucose-Capillary: 115 mg/dL — ABNORMAL HIGH (ref 65–99)
Glucose-Capillary: 166 mg/dL — ABNORMAL HIGH (ref 65–99)

## 2015-08-29 LAB — CBC
HCT: 24.7 % — ABNORMAL LOW (ref 39.0–52.0)
Hemoglobin: 8.4 g/dL — ABNORMAL LOW (ref 13.0–17.0)
MCH: 29.3 pg (ref 26.0–34.0)
MCHC: 34 g/dL (ref 30.0–36.0)
MCV: 86.1 fL (ref 78.0–100.0)
PLATELETS: 164 10*3/uL (ref 150–400)
RBC: 2.87 MIL/uL — ABNORMAL LOW (ref 4.22–5.81)
RDW: 13 % (ref 11.5–15.5)
WBC: 7.5 10*3/uL (ref 4.0–10.5)

## 2015-08-29 MED ORDER — ALBUTEROL SULFATE (2.5 MG/3ML) 0.083% IN NEBU
2.5000 mg | INHALATION_SOLUTION | RESPIRATORY_TRACT | Status: DC | PRN
Start: 2015-08-29 — End: 2015-08-31

## 2015-08-29 MED ORDER — SENNA 8.6 MG PO TABS
2.0000 | ORAL_TABLET | Freq: Every day | ORAL | Status: DC
  Administered 2015-08-29 – 2015-08-31 (×3): 17.2 mg via ORAL
  Filled 2015-08-29 (×3): qty 2

## 2015-08-29 MED ORDER — SODIUM CHLORIDE 0.9 % IV SOLN
510.0000 mg | INTRAVENOUS | Status: DC
  Administered 2015-08-29: 510 mg via INTRAVENOUS
  Filled 2015-08-29: qty 17

## 2015-08-29 MED ORDER — DARBEPOETIN ALFA 100 MCG/0.5ML IJ SOSY: 100.0000 ug | PREFILLED_SYRINGE | INTRAMUSCULAR | Status: DC

## 2015-08-29 MED ORDER — FUROSEMIDE 80 MG PO TABS
80.0000 mg | ORAL_TABLET | Freq: Two times a day (BID) | ORAL | Status: DC
  Administered 2015-08-29 – 2015-08-31 (×4): 80 mg via ORAL
  Filled 2015-08-29 (×4): qty 1

## 2015-08-29 NOTE — Consult Note (Signed)
[facility] NOTE  MRN : [id number]  Reason for Consult: CKD stage IV Referring Physician: Dr. Mercy Moore  History of Present Illness: 50 y/o male with CKD stage IV.  We are ask to creat access fr HD.  Had Scr done 08/26/15 and was 3.8, yest 4.1 and today 4.35. He was admitted secondary to fluid over load.  He currently is not on HD.  Past medical history includes: DM type 1 managed with Insulin, hypertension Coreg and Norvasc, hyperlipidemia managed with Pravastatin.  He doesn't speak much english, so his niece interpreted for me today.    Current Facility-Administered Medications  Medication Dose Route Frequency Provider Last Rate Last Dose  . acetaminophen (TYLENOL) tablet 650 mg  650 mg Oral Q6H PRN Samella Parr, NP       Or  . acetaminophen (TYLENOL) suppository 650 mg  650 mg Rectal Q6H PRN Samella Parr, NP      . albuterol (PROVENTIL) (2.5 MG/3ML) 0.083% nebulizer solution 2.5 mg  2.5 mg Nebulization Q4H PRN Milagros Loll, MD      . amLODipine (NORVASC) tablet 10 mg  10 mg Oral Daily Samella Parr, NP   10 mg at 08/29/15 1040  . carvedilol (COREG) tablet 25 mg  25 mg Oral BID WC Samella Parr, NP   25 mg at 08/29/15 1039  . [START ON 09/05/2015] Darbepoetin Alfa (ARANESP) injection 100 mcg  100 mcg Intravenous Q Fri-HD Fleet Contras, MD      . docusate sodium (COLACE) capsule 100 mg  100 mg Oral BID Samella Parr, NP   100 mg at 08/29/15 1040  . enoxaparin (LOVENOX) injection 30 mg  30 mg Subcutaneous Q24H Samella Parr, NP   30 mg at 08/28/15 1717  . ferumoxytol (FERAHEME) 510 mg in sodium chloride 0.9 % 100 mL IVPB  510 mg Intravenous Weekly Fleet Contras, MD      . furosemide (LASIX) tablet 80 mg  80 mg Oral BID Fleet Contras, MD      . insulin aspart (novoLOG) injection 0-5 Units  0-5 Units Subcutaneous QHS Samella Parr, NP   0 Units at 08/28/15 2144  . insulin aspart (novoLOG) injection 0-9 Units  0-9 Units  Subcutaneous TID WC Samella Parr, NP   0 Units at 08/28/15 1700  . loratadine (CLARITIN) tablet 10 mg  10 mg Oral Daily Samella Parr, NP   10 mg at 08/29/15 1040  . omega-3 acid ethyl esters (LOVAZA) capsule 1 g  1 g Oral BID Samella Parr, NP   1 g at 08/29/15 1040  . pravastatin (PRAVACHOL) tablet 40 mg  40 mg Oral q1800 Samella Parr, NP   40 mg at 08/28/15 1716    Pt meds include: Statin :Yes Betablocker: Yes ASA: No Other anticoagulants/antiplatelets: none  Past Medical History  Diagnosis Date  . Diabetes mellitus without complication ([place])   . Hypertension   . High cholesterol   . Chronic kidney disease (CKD), stage IV (severe) ([facility])     Past Surgical History  Procedure Laterality Date  . Umbilical hernia repair      Social History Social History  Substance Use Topics  . Smoking status: Never Smoker   . Smokeless tobacco: Never Used  . Alcohol Use: No    Family History Family History  Problem Relation Age of Onset  . Hypertension Mother   . Hyperlipidemia Mother   . Diabetes Mellitus II Sister  Allergies  Allergen Reactions  . Pork-Derived Products Rash and Other (See Comments)    Raises blood pressure     REVIEW OF SYSTEMS  General: [ ]  Weight loss, [ ]  Fever, [ ]  chills Neurologic: [ ]  Dizziness, [ ]  Blackouts, [ ]  Seizure [ ]  Stroke, [ ]  "Mini stroke", [ ]  Slurred speech, [ ]  Temporary blindness; [ ]  weakness in arms or legs, [ ]  Hoarseness [ ]  Dysphagia Cardiac: [ ]  Chest pain/pressure, [x]  Shortness of breath at rest [x ] Shortness of breath with exertion, [ ]  Atrial fibrillation or irregular heartbeat  Vascular: [ ]  Pain in legs with walking, [ ]  Pain in legs at rest, [ ]  Pain in legs at night,  [ ]  Non-healing ulcer, [ ]  Blood clot in vein/DVT,   Pulmonary: [ ]  Home oxygen, [ ]  Productive cough, [ ]  Coughing up blood, [ ]  Asthma,  [ ]  Wheezing [ ]  COPD Musculoskeletal:  [ ]  Arthritis, [ ]  Low back pain, [ ]  Joint pain Hematologic: [  ] Easy Bruising, [ ]  Anemia; [ ]  Hepatitis Gastrointestinal: [ ]  Blood in stool, [ ]  Gastroesophageal Reflux/heartburn, Urinary: [x ] chronic Kidney disease, [ ]  on HD - [ ]  MWF or [ ]  TTHS, [ ]  Burning with urination, [ ]  Difficulty urinating Skin: [ ]  Rashes, [ ]  Wounds Psychological: [ ]  Anxiety, [ ]  Depression  Physical Examination Filed Vitals:   08/28/15 1626 08/28/15 2006 08/29/15 0635 08/29/15 0753  BP: 164/87 134/64 142/74 136/70  Pulse: 74 69 65 68  Temp: 98.2 F (36.8 C) 98.2 F (36.8 C) 98.2 F (36.8 C) 98.4 F (36.9 C)  TempSrc: Oral Oral Oral Oral  Resp: 17 18 19 18   Height:      Weight:  208 lb 5.4 oz (94.5 kg)    SpO2: 97% 95% 97% 97%   Body mass index is 39.38 kg/(m^2).  General:  WDWN in NAD Gait: Normal HENT: WNL Eyes: Pupils equal Pulmonary: some what labored breathing , inspiratory wheezing Cardiac: RRR, without  Murmurs, rubs or gallops; No carotid bruits Abdomen: soft, NT, no masses Skin: no rashes, ulcers noted;  no Gangrene , no cellulitis; no open wounds;   Vascular Exam/Pulses:Palpable radial, DP pulses Bilaterally Edema bilateral legs   Musculoskeletal: no muscle wasting or atrophy;  Neurologic: A&O X 3; Appropriate Affect ;  SENSATION: normal; MOTOR FUNCTION: 5/5 Symmetric Speech is fluent/normal   Significant Diagnostic Studies: CBC Lab Results  Component Value Date   WBC 7.5 08/29/2015   HGB 8.4* 08/29/2015   HCT 24.7* 08/29/2015   MCV 86.1 08/29/2015   PLT 164 08/29/2015    BMET    Component Value Date/Time   NA 141 08/29/2015 0624   K 4.9 08/29/2015 0624   CL 113* 08/29/2015 0624   CO2 20* 08/29/2015 0624   GLUCOSE 87 08/29/2015 0624   BUN 49* 08/29/2015 0624   CREATININE 4.35* 08/29/2015 0624   CREATININE 3.88* 08/26/2015 1557   CALCIUM 8.8* 08/29/2015 0624   GFRNONAA 15* 08/29/2015 0624   GFRNONAA 17* 08/26/2015 1557   GFRAA 17* 08/29/2015 0624   GFRAA 20* 08/26/2015 1557   Estimated Creatinine Clearance: 19.9  mL/min (by C-G formula based on Cr of 4.35).  Non-Invasive Vascular Imaging:  Pending vein mapping  ASSESSMENT/PLAN:  CKD stage IV He is right hand dominant.  He is not on HD currently but his creatinine is increasing and Dr. Mercy Moore want [name] to creat permanent HD creation started.  Pending vein  mapping we will plan left AV fistula.    Laurence Slate [facility] 08/29/2015 2:26 PM  Agree with above.  Vein map pending. Will try to arrange placement of AVF next week.   [name], MD, [place] [phone] Office: [phone]

## 2015-08-29 NOTE — Plan of Care (Signed)
Problem: Phase I Progression Outcomes Goal: Flu/PneumoVaccines if indicated Outcome: Not Applicable Date Met:  15/48/84 Received flu and pneumonia vaccines 08/05/2015.

## 2015-08-29 NOTE — Plan of Care (Signed)
Problem: Education: Goal: Knowledge of disease and its progression will improve Outcome: Completed/Met Date Met:  08/29/15 Dialysis videos shown to patient and family.

## 2015-08-29 NOTE — Consult Note (Signed)
PULMONARY / CRITICAL CARE MEDICINE   Name: Eric Glenn MRN: [id number] DOB: Sep 26, 1965    ADMISSION DATE:  08/28/2015 CONSULTATION DATE:  08/28/18  REFERRING MD:  [facility]  CHIEF COMPLAINT:  dyspnea  HISTORY OF PRESENT ILLNESS:   50 year old male former smoker with history of DM, HTN, CKD4 presenting with dyspnea to the ED 2/9. He has been having worsening dyspnea over the past 3-4 months. He reports no dyspnea standing up but worse when he is trying to lie down. He has alos had worsening breathing through his left nostril over the same period of time. He has minimal cough productive of clear sputum. His rhinorrhea is minimal and at baseline as well. He has occasional wheezing over the past 2 weeks when he coughs a lot more. Last episode was 2-3 days ago. He denies fevers, chills, post nasal drip. Denies history of asthma. Former smoker that quit 10 years ago. About 8 pack-year history.   He was seen by his PCP on 2/7. Complained of bilateral pedal edema and dyspnea described as "inability to get air through his left nostril."   He was hospitalized 1/15 for hypertensive urgency and bronchitis. Treated with Augmentin.  PAST MEDICAL HISTORY :  He  has a past medical history of Diabetes mellitus without complication ([place]); Hypertension; High cholesterol; and Chronic kidney disease (CKD), stage IV (severe) ([place]).  PAST SURGICAL HISTORY: He  has past surgical history that includes Umbilical hernia repair.  Allergies  Allergen Reactions  . Pork-Derived Products Rash and Other (See Comments)    Raises blood pressure    No current facility-administered medications on file prior to encounter.   Current Outpatient Prescriptions on File Prior to Encounter  Medication Sig  . amLODipine (NORVASC) 10 MG tablet Take 1 tablet (10 mg total) by mouth daily.  . carvedilol (COREG) 25 MG tablet Take 1 tablet (25 mg total) by mouth 2 (two) times daily with a meal.  . glipiZIDE  (GLUCOTROL) 5 MG tablet Take 1 tablet (5 mg total) by mouth 2 (two) times daily before a meal.  . lactulose (CHRONULAC) 10 GM/15ML solution Take 15 mLs (10 g total) by mouth 2 (two) times daily as needed for mild constipation.  Marland Kitchen loratadine (CLARITIN) 10 MG tablet Take 1 tablet (10 mg total) by mouth daily.  Marland Kitchen omega-3 acid ethyl esters (LOVAZA) 1 g capsule Take 1 capsule (1 g total) by mouth 2 (two) times daily.  Marland Kitchen OVER THE COUNTER MEDICATION Place 1 spray into both nostrils daily as needed (congestion). Over the counter nasal spray  . pravastatin (PRAVACHOL) 40 MG tablet Take 1 tablet (40 mg total) by mouth daily at 6 PM.  . Blood Glucose Monitoring Suppl (TRUE METRIX METER) DEVI 1 each by Does not apply route 3 (three) times daily before meals.  . cetirizine (ZYRTEC) 10 MG tablet Take 1 tablet (10 mg total) by mouth daily. (Patient not taking: Reported on 08/28/2015)  . fluticasone (FLONASE) 50 MCG/ACT nasal spray Place 1 spray into both nostrils daily. (Patient not taking: Reported on 08/28/2015)  . furosemide (LASIX) 20 MG tablet Take 1 tablet (20 mg total) by mouth daily.  Marland Kitchen glucose blood (TRUE METRIX BLOOD GLUCOSE TEST) test strip Use three times daily before meals  . TRUEPLUS LANCETS 28G MISC 1 each by Does not apply route 3 (three) times daily before meals.    FAMILY HISTORY:  His has no family status information on file.   SOCIAL HISTORY: He  reports that he has never  smoked. He has never used smokeless tobacco. He reports that he does not drink alcohol or use illicit drugs.  REVIEW OF SYSTEMS:   Constitutional: see HPI Eyes: no vision changes Ears, nose, mouth, throat, and face: see HPI Respiratory: see HPI Cardiovascular: no chest pain Gastrointestinal: no nausea/vomiting, no abdominal pain Genitourinary: no dysuria Integument: no rash Hematologic/lymphatic: +edema Musculoskeletal: no myalgias Neurological: no paresthesias, no weakness   SUBJECTIVE:   VITAL SIGNS: BP  136/70 mmHg  Pulse 68  Temp(Src) 98.4 F (36.9 C) (Oral)  Resp 18  Ht 5\' 1"  (1.549 m)  Wt 208 lb 5.4 oz (94.5 kg)  BMI 39.38 kg/m2  SpO2 97%  INTAKE / OUTPUT: I/O last 3 completed shifts: In: 240 [P.O.:240] Out: 300 [Urine:300]  PHYSICAL EXAMINATION: General:  NAD, sitting up in bed at almost 90 degree incline. Neuro:  Alert, interactive, no gross deficits HEENT:  [place]/AT, MMM Cardiovascular:  RRR Lungs:  Rales at bilateral bases. No wheezes Abdomen:  Soft, nondistended Musculoskeletal:  1+ BLE edema Skin:  Warm and well perfused  LABS:  BMET  Recent Labs Lab 08/26/15 1557 08/28/15 0547 08/28/15 0553 08/29/15 0624  NA 141 140 142 141  K 5.6* 5.3* 5.1 4.9  CL 113* 113* 113* 113*  CO2 17* 18*  --  20*  BUN 53* 46* 42* 49*  CREATININE 3.88* 4.06* 4.10* 4.35*  GLUCOSE 76 117* 111* 87    Electrolytes  Recent Labs Lab 08/26/15 1557 08/28/15 0547 08/29/15 0624  CALCIUM 8.3* 8.8* 8.8*    CBC  Recent Labs Lab 08/28/15 0547 08/28/15 0553 08/29/15 0624  WBC 10.7*  --  7.5  HGB 9.6* 9.5* 8.4*  HCT 27.5* 28.0* 24.7*  PLT 169  --  164    Liver Enzymes  Recent Labs Lab 08/26/15 1557  AST 14  ALT 14  ALKPHOS 65  BILITOT 0.3  ALBUMIN 3.4*    Glucose  Recent Labs Lab 08/28/15 1505 08/28/15 1624 08/28/15 2005 08/29/15 0748  GLUCAP 71 77 79 82    Imaging Ct Chest Wo Contrast  08/29/2015  CLINICAL DATA:  Dyspnea on exertion, cough, congestion and difficulty breathing through the left nostril. Symptoms for 3-4 months. EXAM: CT CHEST WITHOUT CONTRAST TECHNIQUE: Multidetector CT imaging of the chest was performed following the standard protocol without IV contrast. COMPARISON:  PA and lateral chest 08/28/2015 and 05/15/2007. FINDINGS: There is mild cardiomegaly. No pericardial effusion. Small bilateral pleural effusions are identified. No axillary, hilar or mediastinal lymphadenopathy. No calcific coronary artery disease is identified. Mild aortic  atherosclerosis is noted. The lungs demonstrate patchy airspace disease in the left upper lobe. Dependent atelectasis is also identified and more notable on the right. Visualized upper abdomen demonstrates no focal abnormality. There is some motion on images through the upper abdomen. No focal bony abnormality is identified. IMPRESSION: Patchy left upper lobe airspace disease has an appearance most concerning for bronchopneumonia. Small bilateral pleural effusions with associated basilar atelectasis. Mild cardiomegaly. Electronically Signed   By: Inge Rise M.D.   On: 08/29/2015 10:49   Ct Maxillofacial Wo Cm  08/29/2015  CLINICAL DATA:  Dyspnea on exertion, congestion, cough, and difficulty breathing through left nostril EXAM: CT MAXILLOFACIAL WITHOUT CONTRAST TECHNIQUE: Multidetector CT imaging of the maxillofacial structures was performed. Multiplanar CT image reconstructions were also generated. A small metallic BB was placed on the right temple in order to reliably differentiate right from left. COMPARISON:  None. FINDINGS: Mucous retention cyst measuring about 1.5 cm along the floor of  the right maxillary sinus. Sphenoid sinuses are clear. Ethmoid air cells and frontal sinuses are clear. No significant abnormalities left maxillary sinus. Ostiomeatal complexes are patent bilaterally. There is moderate leftward nasal septum deviation. This causes mild to moderate relative narrowing of the left anterior nasal passage as compared to the right. No significant mucosal abnormality contributing to this. Turbinates appear symmetric bilaterally. IMPRESSION: Mild inflammatory change, chronic, right maxillary sinus. Moderate anterior left nasal passage narrowing due to septal deviation. Electronically Signed   By: Skipper Cliche M.D.   On: 08/29/2015 10:44     STUDIES:  08/04/2015 venous duplex with no DVT 08/04/2015 echo with LV EF 50-55%  CULTURES: 2/9 RVP  >>  ANTIBIOTICS: none   DISCUSSION: 50 year old male former smoker with history of DM, HTN, CKD4 presenting with dyspnea to the ED 2/9. He has been having worsening dyspnea over the past 3-4 months. Describes the dyspnea as more of orthopnea.  ASSESSMENT / PLAN:  Orthopnea: Leukocytosis to 10.7 on admission. None this morning. Flu negative. BNP 229.4. CT maxillofacial with mild inflammatory change chronic of right maxillary sinus. Moderate anterior left nasal passage narrowing due to septal deviation. CT chest with patchy upper lobe airspace disease and small bilateral pleural effusions. Recently treated bronchitis with Augmentin. Probable that his dyspnea is related to volume overload 2/2 worsening renal failure. Other differential would be pericardial effusion which there is no obvious effusion on CT. No evidence of diaphragm dysfunction on imaging either. -Given that he has been afebrile and no leukocytosis this morning, would not give antibiotics. -RVP pending -Continue diuresis as needed  Recent bronchitis: Describes occasional wheezing. None today. Likely intermittent bronchospasm from recent bronchitis. -Albuterol neb prn  Acute on CKD4: Baseline Cr around 3.2. His creatinine has been worsening from 4.06 on admission to 4.35 today.  Chronic Anemia: Baseline hgb around 9.5. His hgb was 9.6 on admission. Iron low and ferritin normal. Consistent with anemia of chronic disease.  Please call PCCM if further questions arise.  Jacques Earthly, MD  Internal Medicine PGY-2  Pulmonary and [place] Pager: [phone]  08/29/2015, 11:34 AM

## 2015-08-29 NOTE — Plan of Care (Signed)
Problem: Phase I Progression Outcomes Goal: O2 sats > or equal 90% or at baseline Outcome: Completed/Met Date Met:  08/29/15 Maintaining O2 sat 96-97% on RA. Continuous pulse ox in place.

## 2015-08-29 NOTE — Consult Note (Signed)
Eric Glenn is an 50 y.o. male referred by Dr Lindaann Pascal   Chief Complaint: Acute on CKD 4 HPI: 50 yo hispanic male admitted yest after presenting to ER for [place].  Was just DC from hospital 3 weeks ago after presenting for CP.  SCr was in the 3's (last Scr was 2008 and was .7), renal US unremarkable and UA showed protienuria. Had Scr done 08/26/15 and was 3.8, yest 4.1 and today 4.35.  His lasix was stopped because of changed in renal fx.  Has had DM x 26yr and HTN for ? Duration.  He really hasn't been followed by a doctor for years.  Past Medical History  Diagnosis Date  . Diabetes mellitus without complication ([place])   . Hypertension   . High cholesterol   . Chronic kidney disease (CKD), stage IV (severe) ([facility])     Past Surgical History  Procedure Laterality Date  . Umbilical hernia repair      Family History  Problem Relation Age of Onset  . Hypertension Mother   . Hyperlipidemia Mother   . Diabetes Mellitus II Sister    Social History:  reports that he has never smoked. He has never used smokeless tobacco. He reports that he does not drink alcohol or use illicit drugs.  Allergies:  Allergies  Allergen Reactions  . Pork-Derived Products Rash and Other (See Comments)    Raises blood pressure    Medications Prior to Admission  Medication Sig Dispense Refill  . amLODipine (NORVASC) 10 MG tablet Take 1 tablet (10 mg total) by mouth daily. 30 tablet 2  . carvedilol (COREG) 25 MG tablet Take 1 tablet (25 mg total) by mouth 2 (two) times daily with a meal. 60 tablet 2  . glipiZIDE (GLUCOTROL) 5 MG tablet Take 1 tablet (5 mg total) by mouth 2 (two) times daily before a meal. 60 tablet 2  . lactulose (CHRONULAC) 10 GM/15ML solution Take 15 mLs (10 g total) by mouth 2 (two) times daily as needed for mild constipation. 946 mL 2  . loratadine (CLARITIN) 10 MG tablet Take 1 tablet (10 mg total) by mouth daily. 10 tablet 0  . omega-3 acid ethyl esters (LOVAZA) 1 g capsule Take 1  capsule (1 g total) by mouth 2 (two) times daily. 60 capsule 1  . OVER THE COUNTER MEDICATION Place 1 spray into both nostrils daily as needed (congestion). Over the counter nasal spray    . pravastatin (PRAVACHOL) 40 MG tablet Take 1 tablet (40 mg total) by mouth daily at 6 PM. 30 tablet 2  . Blood Glucose Monitoring Suppl (TRUE METRIX METER) DEVI 1 each by Does not apply route 3 (three) times daily before meals. 1 Device 0  . cetirizine (ZYRTEC) 10 MG tablet Take 1 tablet (10 mg total) by mouth daily. (Patient not taking: Reported on 08/28/2015) 30 tablet 3  . fluticasone (FLONASE) 50 MCG/ACT nasal spray Place 1 spray into both nostrils daily. (Patient not taking: Reported on 08/28/2015) 16 g 2  . furosemide (LASIX) 20 MG tablet Take 1 tablet (20 mg total) by mouth daily. 30 tablet 0  . glucose blood (TRUE METRIX BLOOD GLUCOSE TEST) test strip Use three times daily before meals 100 each 12  . TRUEPLUS LANCETS 28G MISC 1 each by Does not apply route 3 (three) times daily before meals. 100 each 12     Lab Results: UA: >300 protein, 0-5 wbc, 0-5 rbc   Recent Labs  08/28/15 0547 08/28/15 0553 08/29/15 [id number]  WBC 10.7*  --  7.5  HGB 9.6* 9.5* 8.4*  HCT 27.5* 28.0* 24.7*  PLT 169  --  164   BMET  Recent Labs  08/26/15 1557 08/28/15 0547 08/28/15 0553 08/29/15 0624  NA 141 140 142 141  K 5.6* 5.3* 5.1 4.9  CL 113* 113* 113* 113*  CO2 17* 18*  --  20*  GLUCOSE 76 117* 111* 87  BUN 53* 46* 42* 49*  CREATININE 3.88* 4.06* 4.10* 4.35*  CALCIUM 8.3* 8.8*  --  8.8*   LFT  Recent Labs  08/26/15 1557  PROT 6.1  ALBUMIN 3.4*  AST 14  ALT 14  ALKPHOS 65  BILITOT 0.3   Dg Chest 2 View  08/28/2015  CLINICAL DATA:  Acute onset of shortness of breath and mid chest pain. Initial encounter. EXAM: CHEST  2 VIEW COMPARISON:  Chest radiograph performed 08/03/2015 FINDINGS: The lungs are well-aerated. Vascular congestion is noted. Increased interstitial markings raise concern for mild  interstitial edema. There is no evidence of pleural effusion or pneumothorax. The heart is borderline normal in size. No acute osseous abnormalities are seen. IMPRESSION: Vascular congestion noted. Increased interstitial markings raise concern for mild interstitial edema. Electronically Signed   By: Garald Balding M.D.   On: 08/28/2015 05:50   Dg Abd 1 View  08/28/2015  CLINICAL DATA:  Abdominal bloating EXAM: ABDOMEN - 1 VIEW COMPARISON:  None. FINDINGS: There is moderate stool throughout colon. There is no bowel dilatation or air-fluid level suggesting obstruction. No free air is seen on this supine examination. There are small phleboliths in the pelvis. IMPRESSION: No demonstrable obstruction or free air.  Moderate stool in colon. Electronically Signed   By: Lowella Grip III M.D.   On: 08/28/2015 08:19   Ct Chest Wo Contrast  08/29/2015  CLINICAL DATA:  Dyspnea on exertion, cough, congestion and difficulty breathing through the left nostril. Symptoms for 3-4 months. EXAM: CT CHEST WITHOUT CONTRAST TECHNIQUE: Multidetector CT imaging of the chest was performed following the standard protocol without IV contrast. COMPARISON:  PA and lateral chest 08/28/2015 and 05/15/2007. FINDINGS: There is mild cardiomegaly. No pericardial effusion. Small bilateral pleural effusions are identified. No axillary, hilar or mediastinal lymphadenopathy. No calcific coronary artery disease is identified. Mild aortic atherosclerosis is noted. The lungs demonstrate patchy airspace disease in the left upper lobe. Dependent atelectasis is also identified and more notable on the right. Visualized upper abdomen demonstrates no focal abnormality. There is some motion on images through the upper abdomen. No focal bony abnormality is identified. IMPRESSION: Patchy left upper lobe airspace disease has an appearance most concerning for bronchopneumonia. Small bilateral pleural effusions with associated basilar atelectasis. Mild  cardiomegaly. Electronically Signed   By: Inge Rise M.D.   On: 08/29/2015 10:49   Ct Maxillofacial Wo Cm  08/29/2015  CLINICAL DATA:  Dyspnea on exertion, congestion, cough, and difficulty breathing through left nostril EXAM: CT MAXILLOFACIAL WITHOUT CONTRAST TECHNIQUE: Multidetector CT imaging of the maxillofacial structures was performed. Multiplanar CT image reconstructions were also generated. A small metallic BB was placed on the right temple in order to reliably differentiate right from left. COMPARISON:  None. FINDINGS: Mucous retention cyst measuring about 1.5 cm along the floor of the right maxillary sinus. Sphenoid sinuses are clear. Ethmoid air cells and frontal sinuses are clear. No significant abnormalities left maxillary sinus. Ostiomeatal complexes are patent bilaterally. There is moderate leftward nasal septum deviation. This causes mild to moderate relative narrowing of the left anterior nasal  passage as compared to the right. No significant mucosal abnormality contributing to this. Turbinates appear symmetric bilaterally. IMPRESSION: Mild inflammatory change, chronic, right maxillary sinus. Moderate anterior left nasal passage narrowing due to septal deviation. Electronically Signed   By: Skipper Cliche M.D.   On: 08/29/2015 10:44    ROS: No change in vision No SOB currently No CP No change in bowels No ABd pain No dysuria No new Arthritic CO  PHYSICAL EXAM: Blood pressure 136/70, pulse 68, temperature 98.4 F (36.9 C), temperature source Oral, resp. rate 18, height 5\' 1"  (1.549 m), weight 94.5 kg (208 lb 5.4 oz), SpO2 97 %. HEENT: PERRLA EOMI NECK:No JVD LUNGS:Few faint bibasilar crackles CARDIAC:RRR wo MRG ABD:+ BS NTND No HSM EXT:Tr-1+ edema NEURO:CNI Ox3 no asterixis  Assessment: 1. CKD 4 secondary to DM/HTN 2. Anemia sec fe def and CKD 3. DM 4. HTN 5. DOE which I suspect is more related to his anemia PLAN: 1. Show pt and daughter dx videos 2. Vein  mapping 3. Resume lasix 4. IV iron and aranesp 5. Discussed the inevitability of HD in the near future 6. Consult VVS for placement of access 7. Check PTH 8. Daily Scr 9. Renal diet  Eric Glenn T 08/29/2015, 1:11 PM

## 2015-08-29 NOTE — Progress Notes (Addendum)
PROGRESS NOTE    Eric Glenn [id number] DOB: May 14, 1966 DOA: 08/28/2015 PCP: Arnoldo Morale, MD  HPI/Brief narrative 50 year old male with history of DM 2, HTN, HLD, stage IV chronic kidney disease, former smoker, presented to [facility] ED on 08/28/15 due to worsening DOE, orthopnea and leg edema. He was hospitalized 08/03/15-08/06/15 for chest pain, hypertensive urgency, mild troponin elevation felt to be from renal insufficiency and hypertensive urgency. Noted worsening creatinine. Nephrology & Pulmonology consulted.   Assessment/Plan:   1. Stage IV chronic kidney disease: Secondary to long-standing diabetes and hypertension. Creatinine has been progressively increasing 3.24 > 3.88, >4.06 > 4.1 > 4.35. Recent renal ultrasound without hydronephrosis. Nephrology consultation appreciated-will need HD in the near future. VVS consulted for placement of access to volume overloaded. Lasix resumed. Follow BMP. 2. Dyspnea/orthopnea: Likely multifactorial from volume overload, ??diastolic dysfunction, obesity, possible underlying OSA, deviated nasal septum and anemia. Pulmonology consultation appreciated: Recommend CPAP at bedtime, outpatient CT in a few months to follow on pulmonary infiltrates. Not convinced of bronchopneumonia. Continue Lasix. May consider repeating echo if not improving to rule out pericardial effusion. 3. Hyperlipidemia: Continue statins 4. Anemia in chronic kidney disease: Follow CBCs. Transfuse if hemoglobin <7 g per DL. Starting Aranesp & Ferheme. 5. Type II DM with renal complications: Continue SSI. 6. Essential hypertension: Controlled. Continue amlodipine and carvedilol. 7. Constipation: Bowel regimen.  DVT prophylaxis: Lovenox Code Status: Full Family Communication: Discussed with patient's sister and niece at bedside via telephone interpreter. Disposition Plan: DC home when medically  stable.   Consultants:  Nephrology  Pulmonology  Procedures:  None  Antimicrobials:  None   Subjective: Patient was interviewed via telephone interpreter. States that he feels. Continued orthopnea. Nasal stuffiness, right >left. Minimal intermittent cough with brown sputum. No chest pain. No fever or chills.  Objective: Filed Vitals:   08/28/15 2006 08/29/15 0635 08/29/15 0753 08/29/15 1600  BP: 134/64 142/74 136/70 137/72  Pulse: 69 65 68 64  Temp: 98.2 F (36.8 C) 98.2 F (36.8 C) 98.4 F (36.9 C) 98.7 F (37.1 C)  TempSrc: Oral Oral Oral Oral  Resp: 18 19 18 17   Height:      Weight: 94.5 kg (208 lb 5.4 oz)     SpO2: 95% 97% 97% 95%    Intake/Output Summary (Last 24 hours) at 08/29/15 1705 Last data filed at 08/29/15 1627  Gross per 24 hour  Intake    837 ml  Output    650 ml  Net    187 ml   Filed Weights   08/28/15 1430 08/28/15 2006  Weight: 94.7 kg (208 lb 12.4 oz) 94.5 kg (208 lb 5.4 oz)    Exam:  General exam: Moderately built and overweight male lying comfortably propped up in bed. Respiratory system: Clear. No increased work of breathing. Cardiovascular system: S1 & S2 heard, RRR. No JVD, murmurs, gallops, clicks. 1+ pitting bilateral leg edema.  Gastrointestinal system: Abdomen is nondistended, soft and nontender. Normal bowel sounds heard. Central nervous system: Alert and oriented. No focal neurological deficits. Extremities: Symmetric 5 x 5 power.   Data Reviewed: Basic Metabolic Panel:  Recent Labs Lab 08/26/15 1557 08/28/15 0547 08/28/15 0553 08/29/15 0624  NA 141 140 142 141  K 5.6* 5.3* 5.1 4.9  CL 113* 113* 113* 113*  CO2 17* 18*  --  20*  GLUCOSE 76 117* 111* 87  BUN 53* 46* 42* 49*  CREATININE 3.88* 4.06* 4.10* 4.35*  CALCIUM 8.3* 8.8*  --  8.8*   Liver  Function Tests:  Recent Labs Lab 08/26/15 1557  AST 14  ALT 14  ALKPHOS 65  BILITOT 0.3  PROT 6.1  ALBUMIN 3.4*   No results for input(s): LIPASE, AMYLASE in  the last 168 hours. No results for input(s): AMMONIA in the last 168 hours. CBC:  Recent Labs Lab 08/28/15 0547 08/28/15 0553 08/29/15 0624  WBC 10.7*  --  7.5  HGB 9.6* 9.5* 8.4*  HCT 27.5* 28.0* 24.7*  MCV 84.1  --  86.1  PLT 169  --  164   Cardiac Enzymes: No results for input(s): CKTOTAL, CKMB, CKMBINDEX, TROPONINI in the last 168 hours. BNP (last 3 results) No results for input(s): PROBNP in the last 8760 hours. CBG:  Recent Labs Lab 08/28/15 1505 08/28/15 1624 08/28/15 2005 08/29/15 0748 08/29/15 1138  GLUCAP 71 77 79 82 112*    No results found for this or any previous visit (from the past 240 hour(s)).       Studies: Dg Chest 2 View  08/28/2015  CLINICAL DATA:  Acute onset of shortness of breath and mid chest pain. Initial encounter. EXAM: CHEST  2 VIEW COMPARISON:  Chest radiograph performed 08/03/2015 FINDINGS: The lungs are well-aerated. Vascular congestion is noted. Increased interstitial markings raise concern for mild interstitial edema. There is no evidence of pleural effusion or pneumothorax. The heart is borderline normal in size. No acute osseous abnormalities are seen. IMPRESSION: Vascular congestion noted. Increased interstitial markings raise concern for mild interstitial edema. Electronically Signed   By: Garald Balding M.D.   On: 08/28/2015 05:50   Dg Abd 1 View  08/28/2015  CLINICAL DATA:  Abdominal bloating EXAM: ABDOMEN - 1 VIEW COMPARISON:  None. FINDINGS: There is moderate stool throughout colon. There is no bowel dilatation or air-fluid level suggesting obstruction. No free air is seen on this supine examination. There are small phleboliths in the pelvis. IMPRESSION: No demonstrable obstruction or free air.  Moderate stool in colon. Electronically Signed   By: Lowella Grip III M.D.   On: 08/28/2015 08:19   Ct Chest Wo Contrast  08/29/2015  CLINICAL DATA:  Dyspnea on exertion, cough, congestion and difficulty breathing through the left  nostril. Symptoms for 3-4 months. EXAM: CT CHEST WITHOUT CONTRAST TECHNIQUE: Multidetector CT imaging of the chest was performed following the standard protocol without IV contrast. COMPARISON:  PA and lateral chest 08/28/2015 and 05/15/2007. FINDINGS: There is mild cardiomegaly. No pericardial effusion. Small bilateral pleural effusions are identified. No axillary, hilar or mediastinal lymphadenopathy. No calcific coronary artery disease is identified. Mild aortic atherosclerosis is noted. The lungs demonstrate patchy airspace disease in the left upper lobe. Dependent atelectasis is also identified and more notable on the right. Visualized upper abdomen demonstrates no focal abnormality. There is some motion on images through the upper abdomen. No focal bony abnormality is identified. IMPRESSION: Patchy left upper lobe airspace disease has an appearance most concerning for bronchopneumonia. Small bilateral pleural effusions with associated basilar atelectasis. Mild cardiomegaly. Electronically Signed   By: Inge Rise M.D.   On: 08/29/2015 10:49   Ct Maxillofacial Wo Cm  08/29/2015  CLINICAL DATA:  Dyspnea on exertion, congestion, cough, and difficulty breathing through left nostril EXAM: CT MAXILLOFACIAL WITHOUT CONTRAST TECHNIQUE: Multidetector CT imaging of the maxillofacial structures was performed. Multiplanar CT image reconstructions were also generated. A small metallic BB was placed on the right temple in order to reliably differentiate right from left. COMPARISON:  None. FINDINGS: Mucous retention cyst measuring about 1.5 cm along  the floor of the right maxillary sinus. Sphenoid sinuses are clear. Ethmoid air cells and frontal sinuses are clear. No significant abnormalities left maxillary sinus. Ostiomeatal complexes are patent bilaterally. There is moderate leftward nasal septum deviation. This causes mild to moderate relative narrowing of the left anterior nasal passage as compared to the right.  No significant mucosal abnormality contributing to this. Turbinates appear symmetric bilaterally. IMPRESSION: Mild inflammatory change, chronic, right maxillary sinus. Moderate anterior left nasal passage narrowing due to septal deviation. Electronically Signed   By: Skipper Cliche M.D.   On: 08/29/2015 10:44        Scheduled Meds: . amLODipine  10 mg Oral Daily  . carvedilol  25 mg Oral BID WC  . [START ON 09/05/2015] darbepoetin (ARANESP) injection - DIALYSIS  100 mcg Intravenous Q Fri-HD  . docusate sodium  100 mg Oral BID  . enoxaparin (LOVENOX) injection  30 mg Subcutaneous Q24H  . ferumoxytol  510 mg Intravenous Weekly  . furosemide  80 mg Oral BID  . insulin aspart  0-5 Units Subcutaneous QHS  . insulin aspart  0-9 Units Subcutaneous TID WC  . loratadine  10 mg Oral Daily  . omega-3 acid ethyl esters  1 g Oral BID  . pravastatin  40 mg Oral q1800   Continuous Infusions:   Principal Problem:   DOE (dyspnea on exertion) Active Problems:   Acute renal insufficiency   Essential hypertension   Diabetes mellitus type 2, controlled ([place])   Diabetic retinopathy associated with type 2 diabetes mellitus (HCC)   Proteinuria   Hyperlipidemia   Chronic kidney disease, stage IV (severe) (HCC)   Normocytic anemia   Suspected OSA    Constipation   Acute respiratory failure with hypoxia (HCC)   Orthopnea   Shortness of breath    Time spent: 40 minutes.    Vernell Leep, MD, FACP, FHM. [facility] Pager [phone]  If 7PM-7AM, please contact night-coverage [email or web address] Password [email or web address] 08/29/2015, 5:05 PM

## 2015-08-29 NOTE — Plan of Care (Signed)
Problem: Phase I Progression Outcomes Goal: Progress activity as tolerated unless otherwise ordered Outcome: Completed/Met Date Met:  08/29/15 Out of bed to bathroom without difficulty.

## 2015-08-29 NOTE — Care Management Note (Signed)
Case Management Note  Patient Details  Name: Eric Glenn MRN: [id number] Date of Birth: 1965/08/09  Subjective/Objective:                 CM following for progression and d/c planningl   Action/Plan: 08/29/2015 Chart reviewed and noted CM consult re possible need for PSG after d/c. This CM contacted [facility] to clarify process for arranging PSG is needed. Await response, will follow for recommendations per Pulmonary or attending.   Expected Discharge Date:                  Expected Discharge Plan:  Home/Self Care  In-House Referral:  Clinical Social Work  Discharge planning Services  CM Consult  Post Acute Care Choice:    Choice offered to:     DME Arranged:    DME Agency:     HH Arranged:    HH Agency:     Status of Service:  In process, will continue to follow  Medicare Important Message Given:    Date Medicare IM Given:    Medicare IM give by:    Date Additional Medicare IM Given:    Additional Medicare Important Message give by:     If discussed at [place] of Stay Meetings, dates discussed:    Additional Comments:  Adron Bene, RN 08/29/2015, 2:14 PM

## 2015-08-30 ENCOUNTER — Observation Stay (HOSPITAL_BASED_OUTPATIENT_CLINIC_OR_DEPARTMENT_OTHER): Payer: MEDICAID

## 2015-08-30 DIAGNOSIS — N189 Chronic kidney disease, unspecified: Secondary | ICD-10-CM

## 2015-08-30 DIAGNOSIS — J9601 Acute respiratory failure with hypoxia: Secondary | ICD-10-CM

## 2015-08-30 DIAGNOSIS — N289 Disorder of kidney and ureter, unspecified: Secondary | ICD-10-CM

## 2015-08-30 DIAGNOSIS — K5900 Constipation, unspecified: Secondary | ICD-10-CM

## 2015-08-30 DIAGNOSIS — N184 Chronic kidney disease, stage 4 (severe): Secondary | ICD-10-CM

## 2015-08-30 LAB — RENAL FUNCTION PANEL
ALBUMIN: 2.6 g/dL — AB (ref 3.5–5.0)
ANION GAP: 8 (ref 5–15)
BUN: 49 mg/dL — ABNORMAL HIGH (ref 6–20)
CO2: 21 mmol/L — ABNORMAL LOW (ref 22–32)
Calcium: 8.6 mg/dL — ABNORMAL LOW (ref 8.9–10.3)
Chloride: 112 mmol/L — ABNORMAL HIGH (ref 101–111)
Creatinine, Ser: 4.35 mg/dL — ABNORMAL HIGH (ref 0.61–1.24)
GFR calc Af Amer: 17 mL/min — ABNORMAL LOW (ref >60)
GFR, EST NON AFRICAN AMERICAN: 15 mL/min — AB (ref >60)
Glucose, Bld: 126 mg/dL — ABNORMAL HIGH (ref 65–99)
PHOSPHORUS: 5.5 mg/dL — AB (ref 2.5–4.6)
POTASSIUM: 4.6 mmol/L (ref 3.5–5.1)
Sodium: 141 mmol/L (ref 135–145)

## 2015-08-30 LAB — CBC
HEMATOCRIT: 24.8 % — AB (ref 39.0–52.0)
HEMOGLOBIN: 8.3 g/dL — AB (ref 13.0–17.0)
MCH: 28.5 pg (ref 26.0–34.0)
MCHC: 33.5 g/dL (ref 30.0–36.0)
MCV: 85.2 fL (ref 78.0–100.0)
Platelets: 154 10*3/uL (ref 150–400)
RBC: 2.91 MIL/uL — ABNORMAL LOW (ref 4.22–5.81)
RDW: 12.8 % (ref 11.5–15.5)
WBC: 5.6 10*3/uL (ref 4.0–10.5)

## 2015-08-30 LAB — GLUCOSE, CAPILLARY
GLUCOSE-CAPILLARY: 123 mg/dL — AB (ref 65–99)
GLUCOSE-CAPILLARY: 113 mg/dL — AB (ref 65–99)
Glucose-Capillary: 107 mg/dL — ABNORMAL HIGH (ref 65–99)
Glucose-Capillary: 127 mg/dL — ABNORMAL HIGH (ref 65–99)

## 2015-08-30 LAB — OCCULT BLOOD X 1 CARD TO LAB, STOOL: Fecal Occult Bld: NEGATIVE

## 2015-08-30 NOTE — Progress Notes (Signed)
Right  Upper Extremity Vein Map    Cephalic  Segment Diameter Depth Comment  1. Axilla 4.90mm 4mm   2. Mid upper arm 39mm 66mm   3. Above AC 53mm 19mm branch  4. In AC 58mm 2.64mm   5. Below AC 3.24mm 2.52mm   6. Mid forearm 2.24mm 3.42mm Multiple branches  7. Wrist 3.68mm 3.28mm    mm mm    mm mm    mm mm    Basilic  Segment Diameter Depth Comment  1. Axilla 4.48mm 61mm   2. Mid upper arm 4.61mm 51mm   3. Above AC 4.110mm 35mm   4. In AC 2.50mm 3.38mm   5. Below AC 3.38mm 36mm branch  6. Mid forearm 1.71mm 2.47mm   7. Wrist 1.48mm 1.50mm    mm mm    mm mm    mm mm    Left Upper Extremity Vein Map    Cephalic  Segment Diameter Depth Comment  1. Axilla 33mm 70mm   2. Mid upper arm 2.21mm 3.88mm   3. Above AC 3.48mm 4.76mm   4. In AC 3.43mm 3.45mm   5. Below AC 3.57mm 2.76mm   6. Mid forearm 3.54mm 3.53mm Multiple branches  7. Wrist 2.2mm 4.77mm    mm mm    mm mm    mm mm    Basilic  Segment Diameter Depth Comment  1. Axilla 2.61mm 46mm   2. Mid upper arm 2.20mm 3.60mm   3. Above AC 2.10mm 3.67mm branch  4. In AC 3.48mm 3.26mm   5. Below AC 2.15mm 3.76mm   6. Mid forearm 2.55mm 3.54mm branch  7. Wrist 1.55mm 1.75mm    mm mm    mm mm    mm mm

## 2015-08-30 NOTE — Progress Notes (Signed)
[facility]                                                                              Patient Demographics  Eric Glenn, is a 50 y.o. male, DOB - 19-Nov-1965, [id number]  Admit date - 08/28/2015   Admitting Physician Albertine Patricia, MD  Outpatient Primary MD for the patient is Arnoldo Morale, MD  LOS -    Chief Complaint  Patient presents with  . Shortness of Breath       Brief HPI   50 year old male with history of DM 2, HTN, HLD, stage IV chronic kidney disease, former smoker, presented to [facility] ED on 08/28/15 due to worsening DOE, orthopnea and leg edema. He was hospitalized 08/03/15-08/06/15 for chest pain, hypertensive urgency, mild troponin elevation felt to be from renal insufficiency and hypertensive urgency. Noted worsening creatinine. Nephrology & Pulmonology consulted.   Assessment & Plan    Acute on Stage IV chronic kidney disease: Secondary to long-standing diabetes x 18years and hypertension. -  Creatinine has been progressively increasing 3.24 > 4.35. UA shows proteinuria.  - Recent renal ultrasound without hydronephrosis. -  Nephrology following closely, likely will need HD in the near future.  -  VVS consulted for placement of access to volume overloaded.  - Lasix resumed.   Active problems Dyspnea/orthopnea: Likely multifactorial from volume overload, ??diastolic dysfunction, obesity, possible underlying OSA, deviated nasal septum and anemia.  - pulmonology was consulted and recommended CPAP at bedtime, outpatient CT in a few months to follow on pulmonary infiltrates. Not convinced of bronchopneumonia. -  Continue Lasix.  - May consider repeating echo if not improving to rule out pericardial effusion.  Hyperlipidemia:  - Continue statins  Anemia in chronic kidney disease:  - Follow CBCs. Transfuse if hemoglobin <7 g per DL.  - Starting Aranesp & Ferheme.  Type II DM with renal complications:  - Continue sliding scale  insulin, HbA1c 7.6 in 1/17  Essential hypertension: -  Controlled. Continue amlodipine and carvedilol.  Constipation: -  Bowel regimen.  Code Status: Full CODE STATUS  Family Communication: Discussed in detail with the patient, all imaging results, lab results explained to the patient, multiple family members in the room   Disposition Plan: Full CODE STATUS  Time Spent in minutes   25 minutes  Procedures  None   Consults   Renal pccm  DVT Prophylaxis  Lovenox  Medications  Scheduled Meds: . amLODipine  10 mg Oral Daily  . carvedilol  25 mg Oral BID WC  . [START ON 09/05/2015] darbepoetin (ARANESP) injection - DIALYSIS  100 mcg Intravenous Q Fri-HD  . enoxaparin (LOVENOX) injection  30 mg Subcutaneous Q24H  . ferumoxytol  510 mg Intravenous Weekly  . furosemide  80 mg Oral BID  . insulin aspart  0-5 Units Subcutaneous QHS  . insulin aspart  0-9 Units Subcutaneous TID WC  . loratadine  10 mg Oral Daily  . omega-3 acid ethyl esters  1 g Oral BID  . pravastatin  40 mg Oral q1800  . senna  2 tablet Oral Daily   Continuous Infusions:  PRN Meds:.acetaminophen **OR** [DISCONTINUED] acetaminophen, albuterol  Antibiotics   Anti-infectives    None        Subjective:   Eric Glenn was seen and examined today. States that he still feels orthopnea and dyspnea on exertion. No fevers or chills or any chest pain at the time of my examination. Patient denies dizziness, chest pain, abdominal pain, N/V/D/C, new weakness, numbess, tingling. No acute events overnight.    Objective:   Blood pressure 159/76, pulse 58, temperature 98.5 F (36.9 C), temperature source Oral, resp. rate 18, height 5\' 1"  (1.549 m), weight 94.6 kg (208 lb 8.9 oz), SpO2 100 %.  Wt Readings from Last 3 Encounters:  08/29/15 94.6 kg (208 lb 8.9 oz)  08/26/15 98.431 kg (217 lb)  08/19/15 95.255 kg (210 lb)     Intake/Output Summary (Last 24 hours) at 08/30/15 1212 Last data filed at  08/30/15 0750  Gross per 24 hour  Intake    837 ml  Output   1950 ml  Net  -1113 ml    Exam  General: Alert and oriented x 3, NAD  HEENT:  PERRLA, EOMI, Anicteric Sclera, mucous membranes moist.   Neck: Supple, no JVD, no masses  CVS: S1 S2 auscultated, no rubs, murmurs or gallops. Regular rate and rhythm.  Respiratory: Clear to auscultation bilaterally, no wheezing, rales or rhonchi  Abdomen: Soft, nontender, nondistended, + bowel sounds  Ext: no cyanosis clubbing, 1-2+ edema  Neuro: AAOx3, Cr N's II- XII. Strength 5/5 upper and lower extremities bilaterally  Skin: No rashes  Psych: Normal affect and demeanor, alert and oriented x3    Data Review   Micro Results No results found for this or any previous visit (from the past 240 hour(s)).  Radiology Reports Dg Chest 2 View  08/28/2015  CLINICAL DATA:  Acute onset of shortness of breath and mid chest pain. Initial encounter. EXAM: CHEST  2 VIEW COMPARISON:  Chest radiograph performed 08/03/2015 FINDINGS: The lungs are well-aerated. Vascular congestion is noted. Increased interstitial markings raise concern for mild interstitial edema. There is no evidence of pleural effusion or pneumothorax. The heart is borderline normal in size. No acute osseous abnormalities are seen. IMPRESSION: Vascular congestion noted. Increased interstitial markings raise concern for mild interstitial edema. Electronically Signed   By: Garald Balding M.D.   On: 08/28/2015 05:50   Dg Chest 2 View  08/03/2015  CLINICAL DATA:  Chest pain with nasal congestion and cough for 3 weeks EXAM: CHEST  2 VIEW COMPARISON:  May 15, 2007 FINDINGS: There is no edema or consolidation. The heart size and pulmonary vascularity are normal. No adenopathy. No bone lesions. No pneumothorax. IMPRESSION: No edema or consolidation. Electronically Signed   By: Lowella Grip III M.D.   On: 08/03/2015 17:47   Dg Abd 1 View  08/28/2015  CLINICAL DATA:  Abdominal bloating  EXAM: ABDOMEN - 1 VIEW COMPARISON:  None. FINDINGS: There is moderate stool throughout colon. There is no bowel dilatation or air-fluid level suggesting obstruction. No free air is seen on this supine examination. There are small phleboliths in the pelvis. IMPRESSION: No demonstrable obstruction or free air.  Moderate stool in colon. Electronically Signed   By: Lowella Grip III M.D.   On: 08/28/2015 08:19   Ct Head Wo Contrast  08/03/2015  CLINICAL DATA:  50 year old male with headaches and hypertension. EXAM: CT HEAD WITHOUT CONTRAST TECHNIQUE: Contiguous axial images were obtained from the base of the skull through the vertex without intravenous contrast. COMPARISON:  None. FINDINGS: The ventricles and  sulci are appropriate in size for patient's age. Minimal periventricular and deep white matter hypodensities represent chronic microvascular ischemic changes. There is no intracranial hemorrhage. No mass effect or midline shift identified. The visualized paranasal sinuses and mastoid air cells are well aerated. The calvarium is intact. IMPRESSION: No acute intracranial pathology. Electronically Signed   By: Anner Crete M.D.   On: 08/03/2015 23:23   Ct Chest Wo Contrast  08/29/2015  CLINICAL DATA:  Dyspnea on exertion, cough, congestion and difficulty breathing through the left nostril. Symptoms for 3-4 months. EXAM: CT CHEST WITHOUT CONTRAST TECHNIQUE: Multidetector CT imaging of the chest was performed following the standard protocol without IV contrast. COMPARISON:  PA and lateral chest 08/28/2015 and 05/15/2007. FINDINGS: There is mild cardiomegaly. No pericardial effusion. Small bilateral pleural effusions are identified. No axillary, hilar or mediastinal lymphadenopathy. No calcific coronary artery disease is identified. Mild aortic atherosclerosis is noted. The lungs demonstrate patchy airspace disease in the left upper lobe. Dependent atelectasis is also identified and more notable on the  right. Visualized upper abdomen demonstrates no focal abnormality. There is some motion on images through the upper abdomen. No focal bony abnormality is identified. IMPRESSION: Patchy left upper lobe airspace disease has an appearance most concerning for bronchopneumonia. Small bilateral pleural effusions with associated basilar atelectasis. Mild cardiomegaly. Electronically Signed   By: Inge Rise M.D.   On: 08/29/2015 10:49   US Renal  08/04/2015  CLINICAL DATA:  Chronic kidney disease EXAM: RENAL / URINARY TRACT ULTRASOUND COMPLETE COMPARISON:  None. FINDINGS: Right Kidney: Length: 14.6 cm. Echogenicity within normal limits. No mass or hydronephrosis visualized. Left Kidney: Length: 13.9 cm. Echogenicity within normal limits. There is a 1.5 x 1.1 x 1.3 cm anechoic left renal mass most consistent with a cyst. No hydronephrosis visualized. Bladder: Appears normal for degree of bladder distention. IMPRESSION: 1. No obstructive uropathy. Electronically Signed   By: Kathreen Devoid   On: 08/04/2015 12:06   Ct Maxillofacial Wo Cm  08/29/2015  CLINICAL DATA:  Dyspnea on exertion, congestion, cough, and difficulty breathing through left nostril EXAM: CT MAXILLOFACIAL WITHOUT CONTRAST TECHNIQUE: Multidetector CT imaging of the maxillofacial structures was performed. Multiplanar CT image reconstructions were also generated. A small metallic BB was placed on the right temple in order to reliably differentiate right from left. COMPARISON:  None. FINDINGS: Mucous retention cyst measuring about 1.5 cm along the floor of the right maxillary sinus. Sphenoid sinuses are clear. Ethmoid air cells and frontal sinuses are clear. No significant abnormalities left maxillary sinus. Ostiomeatal complexes are patent bilaterally. There is moderate leftward nasal septum deviation. This causes mild to moderate relative narrowing of the left anterior nasal passage as compared to the right. No significant mucosal abnormality  contributing to this. Turbinates appear symmetric bilaterally. IMPRESSION: Mild inflammatory change, chronic, right maxillary sinus. Moderate anterior left nasal passage narrowing due to septal deviation. Electronically Signed   By: Skipper Cliche M.D.   On: 08/29/2015 10:44    CBC  Recent Labs Lab 08/28/15 0547 08/28/15 0553 08/29/15 0624 08/30/15 0459  WBC 10.7*  --  7.5 5.6  HGB 9.6* 9.5* 8.4* 8.3*  HCT 27.5* 28.0* 24.7* 24.8*  PLT 169  --  164 154  MCV 84.1  --  86.1 85.2  MCH 29.4  --  29.3 28.5  MCHC 34.9  --  34.0 33.5  RDW 13.0  --  13.0 12.8    Chemistries   Recent Labs Lab 08/26/15 1557 08/28/15 0547 08/28/15 0553 08/29/15  [id number] 08/30/15 0459  NA 141 140 142 141 141  K 5.6* 5.3* 5.1 4.9 4.6  CL 113* 113* 113* 113* 112*  CO2 17* 18*  --  20* 21*  GLUCOSE 76 117* 111* 87 126*  BUN 53* 46* 42* 49* 49*  CREATININE 3.88* 4.06* 4.10* 4.35* 4.35*  CALCIUM 8.3* 8.8*  --  8.8* 8.6*  AST 14  --   --   --   --   ALT 14  --   --   --   --   ALKPHOS 65  --   --   --   --   BILITOT 0.3  --   --   --   --    ------------------------------------------------------------------------------------------------------------------ estimated creatinine clearance is 19.9 mL/min (by C-G formula based on Cr of 4.35). ------------------------------------------------------------------------------------------------------------------ No results for input(s): HGBA1C in the last 72 hours. ------------------------------------------------------------------------------------------------------------------ No results for input(s): CHOL, HDL, LDLCALC, TRIG, CHOLHDL, LDLDIRECT in the last 72 hours. ------------------------------------------------------------------------------------------------------------------  Recent Labs  08/28/15 1506  TSH 2.270   ------------------------------------------------------------------------------------------------------------------  Recent Labs   08/28/15 1506  VITAMINB12 510  FOLATE 15.6  FERRITIN 119  TIBC 263  IRON 13*  RETICCTPCT 1.2    Coagulation profile No results for input(s): INR, PROTIME in the last 168 hours.  No results for input(s): DDIMER in the last 72 hours.  Cardiac Enzymes No results for input(s): CKMB, TROPONINI, MYOGLOBIN in the last 168 hours.  Invalid input(s): CK ------------------------------------------------------------------------------------------------------------------ Invalid input(s): [place]  08/28/15 2005 08/29/15 0748 08/29/15 1138 08/29/15 1559 08/29/15 2002 08/30/15 0749  GLUCAP 79 82 112* 115* 166* 107*     Safiya Girdler M.D. [facility] 08/30/2015, 12:12 PM  Pager: [phone] Between 7am to 7pm - call Pager - [phone]  After 7pm go to [email or web address] - password [email or web address]  Call night coverage person covering after 7pm

## 2015-08-30 NOTE — Progress Notes (Signed)
S: No new CO.  No SOB O:BP 159/76 mmHg  Pulse 58  Temp(Src) 98.5 F (36.9 C) (Oral)  Resp 18  Ht 5\' 1"  (1.549 m)  Wt 94.6 kg (208 lb 8.9 oz)  BMI 39.43 kg/m2  SpO2 100%  Intake/Output Summary (Last 24 hours) at 08/30/15 0823 Last data filed at 08/30/15 0750  Gross per 24 hour  Intake   1077 ml  Output   1950 ml  Net   -873 ml   Weight change: -0.1 kg (-3.5 oz) [id number] and alert CVS:RRR Resp: Few faint crackles Abd:+ BS NTND Ext: tr-1+ edema NEURO:CNI No asterixis   . amLODipine  10 mg Oral Daily  . carvedilol  25 mg Oral BID WC  . [START ON 09/05/2015] darbepoetin (ARANESP) injection - DIALYSIS  100 mcg Intravenous Q Fri-HD  . enoxaparin (LOVENOX) injection  30 mg Subcutaneous Q24H  . ferumoxytol  510 mg Intravenous Weekly  . furosemide  80 mg Oral BID  . insulin aspart  0-5 Units Subcutaneous QHS  . insulin aspart  0-9 Units Subcutaneous TID WC  . loratadine  10 mg Oral Daily  . omega-3 acid ethyl esters  1 g Oral BID  . pravastatin  40 mg Oral q1800  . senna  2 tablet Oral Daily   Ct Chest Wo Contrast  08/29/2015  CLINICAL DATA:  Dyspnea on exertion, cough, congestion and difficulty breathing through the left nostril. Symptoms for 3-4 months. EXAM: CT CHEST WITHOUT CONTRAST TECHNIQUE: Multidetector CT imaging of the chest was performed following the standard protocol without IV contrast. COMPARISON:  PA and lateral chest 08/28/2015 and 05/15/2007. FINDINGS: There is mild cardiomegaly. No pericardial effusion. Small bilateral pleural effusions are identified. No axillary, hilar or mediastinal lymphadenopathy. No calcific coronary artery disease is identified. Mild aortic atherosclerosis is noted. The lungs demonstrate patchy airspace disease in the left upper lobe. Dependent atelectasis is also identified and more notable on the right. Visualized upper abdomen demonstrates no focal abnormality. There is some motion on images through the upper abdomen. No focal bony  abnormality is identified. IMPRESSION: Patchy left upper lobe airspace disease has an appearance most concerning for bronchopneumonia. Small bilateral pleural effusions with associated basilar atelectasis. Mild cardiomegaly. Electronically Signed   By: Inge Rise M.D.   On: 08/29/2015 10:49   Ct Maxillofacial Wo Cm  08/29/2015  CLINICAL DATA:  Dyspnea on exertion, congestion, cough, and difficulty breathing through left nostril EXAM: CT MAXILLOFACIAL WITHOUT CONTRAST TECHNIQUE: Multidetector CT imaging of the maxillofacial structures was performed. Multiplanar CT image reconstructions were also generated. A small metallic BB was placed on the right temple in order to reliably differentiate right from left. COMPARISON:  None. FINDINGS: Mucous retention cyst measuring about 1.5 cm along the floor of the right maxillary sinus. Sphenoid sinuses are clear. Ethmoid air cells and frontal sinuses are clear. No significant abnormalities left maxillary sinus. Ostiomeatal complexes are patent bilaterally. There is moderate leftward nasal septum deviation. This causes mild to moderate relative narrowing of the left anterior nasal passage as compared to the right. No significant mucosal abnormality contributing to this. Turbinates appear symmetric bilaterally. IMPRESSION: Mild inflammatory change, chronic, right maxillary sinus. Moderate anterior left nasal passage narrowing due to septal deviation. Electronically Signed   By: Skipper Cliche M.D.   On: 08/29/2015 10:44   BMET    Component Value Date/Time   NA 141 08/30/2015 0459   K 4.6 08/30/2015 0459   CL 112* 08/30/2015 0459   CO2 21* 08/30/2015  0459   GLUCOSE 126* 08/30/2015 0459   BUN 49* 08/30/2015 0459   CREATININE 4.35* 08/30/2015 0459   CREATININE 3.88* 08/26/2015 1557   CALCIUM 8.6* 08/30/2015 0459   GFRNONAA 15* 08/30/2015 0459   GFRNONAA 17* 08/26/2015 1557   GFRAA 17* 08/30/2015 0459   GFRAA 20* 08/26/2015 1557   CBC    Component Value  Date/Time   WBC 5.6 08/30/2015 0459   RBC 2.91* 08/30/2015 0459   RBC 3.08* 08/28/2015 1506   HGB 8.3* 08/30/2015 0459   HCT 24.8* 08/30/2015 0459   PLT 154 08/30/2015 0459   MCV 85.2 08/30/2015 0459   MCH 28.5 08/30/2015 0459   MCHC 33.5 08/30/2015 0459   RDW 12.8 08/30/2015 0459   LYMPHSABS 2.5 05/15/2007 2230   MONOABS 0.6 05/15/2007 2230   EOSABS 0.3 05/15/2007 2230   BASOSABS 0.0 05/15/2007 2230     Assessment: 1. CKD 4 2. Anemia and Fe def,  Given IV iron and aranesp 3. DOE, I suspect his anemia is playing a big role 4. HTN 5. DM  Plan: 1. Cont PO lasix 2.  Await VVS plan to place AVF 3. PTH pending   Makynli Stills T

## 2015-08-31 DIAGNOSIS — D649 Anemia, unspecified: Secondary | ICD-10-CM

## 2015-08-31 LAB — GLUCOSE, CAPILLARY
Glucose-Capillary: 137 mg/dL — ABNORMAL HIGH (ref 65–99)
Glucose-Capillary: 116 mg/dL — ABNORMAL HIGH (ref 65–99)

## 2015-08-31 LAB — PARATHYROID HORMONE, INTACT (NO CA): PTH: 31 pg/mL (ref 15–65)

## 2015-08-31 MED ORDER — SALINE SPRAY 0.65 % NA SOLN
1.0000 | NASAL | Status: DC | PRN
  Filled 2015-08-31: qty 44

## 2015-08-31 MED ORDER — FUROSEMIDE 80 MG PO TABS: 80.0000 mg | ORAL_TABLET | Freq: Two times a day (BID) | ORAL | Status: DC

## 2015-08-31 NOTE — Progress Notes (Signed)
SATURATION QUALIFICATIONS: (This note is used to comply with regulatory documentation for home oxygen)  Patient Saturations on Room Air at Rest = 100%  Patient Saturations on Room Air while Ambulating = 97%  Patient Saturations on n/a Liters of oxygen while Ambulating = n/a%  Please briefly explain why patient needs home oxygen:

## 2015-08-31 NOTE — Progress Notes (Signed)
Discharge instructions and medications discussed with patient & niece through [employer]. All questions answered.  Prescription given to patient.

## 2015-08-31 NOTE — Progress Notes (Signed)
   VASCULAR SURGERY ASSESSMENT & PLAN:  * Vein map reviewed. It appears that his best vein for a fistula is in the right arm. I have recommended placement of a right radiocephalic or brachiocephalic fistula. He has an IV in the left arm so we will leave that. I will try to schedule his surgery for Tuesday. I discussed this plan through the niece who translates for them.    SUBJECTIVE: No complaints.  PHYSICAL EXAM: Filed Vitals:   08/30/15 1619 08/30/15 2242 08/31/15 0558 08/31/15 0745  BP: 156/82 151/79 139/74 141/73  Pulse: 62 58 62 63  Temp: 98.3 F (36.8 C) 98.4 F (36.9 C) 98.4 F (36.9 C) 98.6 F (37 C)  TempSrc: Oral Oral Oral Oral  Resp: 18 16 18 18   Height:      Weight:      SpO2: 97% 97% 97% 98%   Palpable right radial pulse.  LABS: Lab Results  Component Value Date   WBC 5.6 08/30/2015   HGB 8.3* 08/30/2015   HCT 24.8* 08/30/2015   MCV 85.2 08/30/2015   PLT 154 08/30/2015   Lab Results  Component Value Date   CREATININE 4.35* 08/30/2015   VEIN MAP: The diameters of the forearm cephalic vein on the right range from 2.9 mm-3.4 mm. The upper arm cephalic vein has diameters ranging from 4.0 mm-4.6 mm. The cephalic vein in the left arm is smaller.  CBG (last 3)   Recent Labs  08/30/15 1622 08/30/15 2325 08/31/15 0745  GLUCAP 127* 113* 137*    Principal Problem:   DOE (dyspnea on exertion) Active Problems:   Acute renal insufficiency   Essential hypertension   Diabetes mellitus type 2, controlled ([place])   Diabetic retinopathy associated with type 2 diabetes mellitus (HCC)   Proteinuria   Hyperlipidemia   Chronic kidney disease, stage IV (severe) (HCC)   Normocytic anemia   Suspected OSA    Constipation   Acute respiratory failure with hypoxia ([place])   Orthopnea   Shortness of breath   Gae Gallop Beeper: [id number] 08/31/2015

## 2015-08-31 NOTE — Progress Notes (Signed)
S: No new CO.  No SOB.  Eating well O:BP 141/73 mmHg  Pulse 63  Temp(Src) 98.6 F (37 C) (Oral)  Resp 18  Ht 5\' 1"  (1.549 m)  Wt 94.6 kg (208 lb 8.9 oz)  BMI 39.43 kg/m2  SpO2 98%  Intake/Output Summary (Last 24 hours) at 08/31/15 0911 Last data filed at 08/31/15 0746  Gross per 24 hour  Intake    960 ml  Output   2351 ml  Net  -1391 ml   Weight change:  [id number] and alert CVS:RRR Resp: Clear Abd:+ BS NTND Ext: tr edema NEURO:CNI No asterixis   . amLODipine  10 mg Oral Daily  . carvedilol  25 mg Oral BID WC  . [START ON 09/05/2015] darbepoetin (ARANESP) injection - DIALYSIS  100 mcg Intravenous Q Fri-HD  . enoxaparin (LOVENOX) injection  30 mg Subcutaneous Q24H  . ferumoxytol  510 mg Intravenous Weekly  . furosemide  80 mg Oral BID  . insulin aspart  0-5 Units Subcutaneous QHS  . insulin aspart  0-9 Units Subcutaneous TID WC  . loratadine  10 mg Oral Daily  . omega-3 acid ethyl esters  1 g Oral BID  . pravastatin  40 mg Oral q1800  . senna  2 tablet Oral Daily   Ct Chest Wo Contrast  08/29/2015  CLINICAL DATA:  Dyspnea on exertion, cough, congestion and difficulty breathing through the left nostril. Symptoms for 3-4 months. EXAM: CT CHEST WITHOUT CONTRAST TECHNIQUE: Multidetector CT imaging of the chest was performed following the standard protocol without IV contrast. COMPARISON:  PA and lateral chest 08/28/2015 and 05/15/2007. FINDINGS: There is mild cardiomegaly. No pericardial effusion. Small bilateral pleural effusions are identified. No axillary, hilar or mediastinal lymphadenopathy. No calcific coronary artery disease is identified. Mild aortic atherosclerosis is noted. The lungs demonstrate patchy airspace disease in the left upper lobe. Dependent atelectasis is also identified and more notable on the right. Visualized upper abdomen demonstrates no focal abnormality. There is some motion on images through the upper abdomen. No focal bony abnormality is identified.  IMPRESSION: Patchy left upper lobe airspace disease has an appearance most concerning for bronchopneumonia. Small bilateral pleural effusions with associated basilar atelectasis. Mild cardiomegaly. Electronically Signed   By: Inge Rise M.D.   On: 08/29/2015 10:49   Ct Maxillofacial Wo Cm  08/29/2015  CLINICAL DATA:  Dyspnea on exertion, congestion, cough, and difficulty breathing through left nostril EXAM: CT MAXILLOFACIAL WITHOUT CONTRAST TECHNIQUE: Multidetector CT imaging of the maxillofacial structures was performed. Multiplanar CT image reconstructions were also generated. A small metallic BB was placed on the right temple in order to reliably differentiate right from left. COMPARISON:  None. FINDINGS: Mucous retention cyst measuring about 1.5 cm along the floor of the right maxillary sinus. Sphenoid sinuses are clear. Ethmoid air cells and frontal sinuses are clear. No significant abnormalities left maxillary sinus. Ostiomeatal complexes are patent bilaterally. There is moderate leftward nasal septum deviation. This causes mild to moderate relative narrowing of the left anterior nasal passage as compared to the right. No significant mucosal abnormality contributing to this. Turbinates appear symmetric bilaterally. IMPRESSION: Mild inflammatory change, chronic, right maxillary sinus. Moderate anterior left nasal passage narrowing due to septal deviation. Electronically Signed   By: Skipper Cliche M.D.   On: 08/29/2015 10:44   BMET    Component Value Date/Time   NA 141 08/30/2015 0459   K 4.6 08/30/2015 0459   CL 112* 08/30/2015 0459   CO2 21* 08/30/2015 0459  GLUCOSE 126* 08/30/2015 0459   BUN 49* 08/30/2015 0459   CREATININE 4.35* 08/30/2015 0459   CREATININE 3.88* 08/26/2015 1557   CALCIUM 8.6* 08/30/2015 0459   GFRNONAA 15* 08/30/2015 0459   GFRNONAA 17* 08/26/2015 1557   GFRAA 17* 08/30/2015 0459   GFRAA 20* 08/26/2015 1557   CBC    Component Value Date/Time   WBC 5.6  08/30/2015 0459   RBC 2.91* 08/30/2015 0459   RBC 3.08* 08/28/2015 1506   HGB 8.3* 08/30/2015 0459   HCT 24.8* 08/30/2015 0459   PLT 154 08/30/2015 0459   MCV 85.2 08/30/2015 0459   MCH 28.5 08/30/2015 0459   MCHC 33.5 08/30/2015 0459   RDW 12.8 08/30/2015 0459   LYMPHSABS 2.5 05/15/2007 2230   MONOABS 0.6 05/15/2007 2230   EOSABS 0.3 05/15/2007 2230   BASOSABS 0.0 05/15/2007 2230     Assessment: 1. CKD 4 2. Anemia and Fe def,  Given IV iron and aranesp 3. DOE, I suspect his anemia is playing a big role 4. HTN 5. DM  Plan: 1. Cont PO lasix 2.  Await VVS plan to place AVF.  If mon/tues then keep here, if later then could go home 3. PTH pending 4. Recheck labs in AM   Alixander Rallis T

## 2015-08-31 NOTE — Discharge Summary (Signed)
Physician Discharge Summary  Eric Glenn  [id number]  DOB: February 02, 1966  DOA: 08/28/2015  PCP: Eric Morale, MD  Admit date: 08/28/2015 Discharge date: 08/31/2015  Time spent: Greater than 30 minutes  Recommendations for Outpatient Follow-up:  1. Dr. Arnoldo Glenn, PCP in 5 days with repeat labs (CBC & BMP). Please follow RSV panel that was sent from the hospital. 2. Dr. Deitra Glenn, Vascular Surgery: Follow-up on 09/02/15 regarding placement of AV fistula. Dr. Nicole Glenn office will coordinate with patient's family/niece. Discussed with Dr. Scot Glenn. 3. Dr. Fleet Glenn, Nephrology: To be seen in one week. MDs office will arrange outpatient follow-up. Discussed with Dr. Mercy Glenn. 4. Dr. Brand Glenn, Pulmonology: For evaluation of possible sleep apnea. 5. Recommend repeating CT chest in a couple of months to follow-up on his pulmonary infiltrates seen on noncontrasted CT chest. 6. Recommend outpatient pulmonology consultation and follow-up for evaluation of possible OSA.  Discharge Diagnoses:  Principal Problem:   DOE (dyspnea on exertion) Active Problems:   Acute renal insufficiency   Essential hypertension   Diabetes mellitus type 2, controlled ([place])   Diabetic retinopathy associated with type 2 diabetes mellitus (HCC)   Proteinuria   Hyperlipidemia   Chronic kidney disease, stage IV (severe) (HCC)   Normocytic anemia   Suspected OSA    Constipation   Acute respiratory failure with hypoxia (HCC)   Orthopnea   Shortness of breath   Discharge Condition: Improved & Stable  Diet recommendation: Heart healthy and diabetic diet.  Filed Weights   08/28/15 1430 08/28/15 2006 08/29/15 2004  Weight: 94.7 kg (208 lb 12.4 oz) 94.5 kg (208 lb 5.4 oz) 94.6 kg (208 lb 8.9 oz)    History of present illness:  51 year old male with history of DM 2, HTN, HLD, stage IV chronic kidney disease, former smoker, presented to [facility] ED on 08/28/15 due to worsening DOE,  orthopnea and leg edema. He was hospitalized 08/03/15-08/06/15 for chest pain, hypertensive urgency, mild troponin elevation felt to be from renal insufficiency and hypertensive urgency. Noted worsening creatinine. Nephrology & Pulmonology consulted.  Hospital Course:    1. Stage IV chronic kidney disease: Secondary to long-standing diabetes and hypertension. Creatinine has been progressively increasing 3.24 > 3.88, >4.06 > 4.1 > 4.35. Recent renal ultrasound without hydronephrosis. Nephrology consultation appreciated-will need HD in the near future. Lasix was resumed. Creatinine has stabilized in the 4.3 range over the last 48 hours. Patient has good urine output. Discussed with nephrology/Dr. Mercy Glenn: Okay for discharge home and his office will arrange outpatient follow-up, patient also will follow up early next week for creation of AV fistula by vascular surgery. Discussed with Dr. Scot Glenn was office will arrange follow-up for AV fistula (procedure was initially planned for 2/14 but patient stable for DC home). Intact PTH: Pending 2. Dyspnea/orthopnea: Likely multifactorial from volume overload, ??diastolic dysfunction, obesity, possible underlying OSA, deviated nasal septum and anemia. Pulmonology consultation appreciated: Recommend CPAP at bedtime, outpatient CT in a few months to follow on pulmonary infiltrates. Not convinced of bronchopneumonia. Continue Lasix. Improved. Seems to mainly complain of nasal stuffiness and indicates that this gets better by nasal drops. Volume overload improving with oral Lasix. Not hypoxic with activity. Continue OTC nasal drops. Outpatient follow-up with pulmonology to evaluate for sleep apnea. May consider outpatient ENT consultation for deviated nasal septum. Flu panel PCR: Negative. 3. Hyperlipidemia: Continue statins 4. Anemia in chronic kidney disease: Follow CBCs. Transfuse if hemoglobin <7 g per DL. Received [facility]anesp [facility] Ferheme. Anemia panel: Iron 13, TIBC 263,  saturation ratio 5, ferritin 119, folate 15.6, B12: 510, reticulocytes 37. Hemoglobin stable in the low 8 g range for the last 48 hours. FOBT: Negative. 5. Type II DM with renal complications: Treated with SSI in the hospital. Resume oral hypoglycemics at discharge but monitor closely for hypoglycemia, in the context of chronic kidney disease-in which case dose will have to be reduced or changed to insulin's. 6. Essential hypertension: Controlled. Continue amlodipine and carvedilol. 7. Constipation: Bowel regimen. TSH: Normal at 2.270.  Discussed at length with patient's niece Ms. Eric Glenn. Updated care and answered questions. Advised her regarding importance that patient keeps up outpatient follow-up appointments with physicians listed above and for above procedures. She is agreeable to be the point of contact since she is able to speak and understand [place]. She verbalized understanding.   Consultants:  Nephrology  Pulmonology  Vascular surgery.  Procedures:  None  Antimicrobials:  None   Discharge Exam:  Complaints: Leg swelling improved. Nasal stuffiness on the right side-states that "nose drops help". He ambulated comfortably in the halls without oxygen and did not become hypoxic. Denies any other complaints. Patient was interviewed using a telephone interpreter.  Filed Vitals:   08/30/15 1619 08/30/15 2242 08/31/15 0558 08/31/15 0745  BP: 156/82 151/79 139/74 141/73  Pulse: 62 58 62 63  Temp: 98.3 F (36.8 C) 98.4 F (36.9 C) 98.4 F (36.9 C) 98.6 F (37 C)  TempSrc: Oral Oral Oral Oral  Resp: 18 16 18 18   Height:      Weight:      SpO2: 97% 97% 97% 98%    General exam: Moderately built and overweight male lying comfortably propped up in bed. Respiratory system: Clear. No increased work of breathing. Cardiovascular system: S1 & S2 heard, RRR. No JVD, murmurs, gallops, clicks. Trace pitting bilateral leg edema.  Gastrointestinal system: Abdomen is  nondistended, soft and nontender. Normal bowel sounds heard. Central nervous system: Alert and oriented. No focal neurological deficits. Extremities: Symmetric 5 x 5 power..  Discharge Instructions      Discharge Instructions    (HEART FAILURE PATIENTS) Call MD:  Anytime you have any of the following symptoms: 1) 3 pound weight gain in 24 hours or 5 pounds in 1 week 2) shortness of breath, with or without a dry hacking cough 3) swelling in the hands, feet or stomach 4) if you have to sleep on extra pillows at night in order to breathe.    Complete by:  As directed      Call MD for:  difficulty breathing, headache or visual disturbances    Complete by:  As directed      Call MD for:  extreme fatigue    Complete by:  As directed      Call MD for:  persistant dizziness or light-headedness    Complete by:  As directed      Call MD for:  persistant nausea and vomiting    Complete by:  As directed      Call MD for:  severe uncontrolled pain    Complete by:  As directed      Call MD for:  temperature >100.4    Complete by:  As directed      Diet - low sodium heart healthy    Complete by:  As directed      Diet Carb Modified    Complete by:  As directed      Increase activity slowly    Complete by:  As  directed             Medication List    STOP taking these medications        cetirizine 10 MG tablet  Commonly known as:  ZYRTEC     fluticasone 50 MCG/ACT nasal spray  Commonly known as:  FLONASE      TAKE these medications        amLODipine 10 MG tablet  Commonly known as:  NORVASC  Take 1 tablet (10 mg total) by mouth daily.     carvedilol 25 MG tablet  Commonly known as:  COREG  Take 1 tablet (25 mg total) by mouth 2 (two) times daily with a meal.     furosemide 80 MG tablet  Commonly known as:  LASIX  Take 1 tablet (80 mg total) by mouth 2 (two) times daily.     glipiZIDE 5 MG tablet  Commonly known as:  GLUCOTROL  Take 1 tablet (5 mg total) by mouth 2 (two)  times daily before a meal.     glucose blood test strip  Commonly known as:  TRUE METRIX BLOOD GLUCOSE TEST  Use three times daily before meals     lactulose 10 GM/15ML solution  Commonly known as:  CHRONULAC  Take 15 mLs (10 g total) by mouth 2 (two) times daily as needed for mild constipation.     loratadine 10 MG tablet  Commonly known as:  CLARITIN  Take 1 tablet (10 mg total) by mouth daily.     omega-3 acid ethyl esters 1 g capsule  Commonly known as:  LOVAZA  Take 1 capsule (1 g total) by mouth 2 (two) times daily.     OVER THE COUNTER MEDICATION  Place 1 spray into both nostrils daily as needed (congestion). Over the counter nasal spray     pravastatin 40 MG tablet  Commonly known as:  PRAVACHOL  Take 1 tablet (40 mg total) by mouth daily at 6 PM.     TRUE METRIX METER Devi  1 each by Does not apply route 3 (three) times daily before meals.     TRUEPLUS LANCETS 28G Misc  1 each by Does not apply route 3 (three) times daily before meals.       Follow-up Information    Follow up with Eric Morale, MD. Schedule an appointment as soon as possible for a visit in 5 days.   Specialty:  Family Medicine   Why:  Call office on Monday 08/31/2015 to be seen with repeat labs (CBC & BMP). Thank you    Contact information:   [place] [address] [phone]       Follow up with Windy Kalata, MD. Schedule an appointment as soon as possible for a visit in 1 week.   Specialty:  Nephrology   Why:  MDs office will call with appointment. Please call them if you don't hear back from them in 2-3 days.   Contact information:   [place] [address] [phone]       Follow up with Eric Mayo, MD On 09/02/2015.   Specialties:  Vascular Surgery, Cardiology   Why:  Call office on Monday 08/31/2015 to follow-up regarding planned procedure i.e. AV fistula/dialysis access creation. MDs office will call to arrange follow-up. Please  call them back if you don't hear back from them in the next 1-2 days.   Contact information:   [address] [place] [address] [phone]  Schedule an appointment as soon as possible for a visit with [facility], MD.   Specialty:  Pulmonary Disease   Why:  Call office on Monday 08/31/2015 for evaluation of possible sleep apnea.    Contact information:   [place] [address] [phone]       Get Medicines reviewed and adjusted: Please take all your medications with you for your next visit with your Primary MD  Please request your Primary MD to go over all hospital tests and procedure/radiological results at the follow up. Please ask your Primary MD to get all Hospital records sent to his/her office.  If you experience worsening of your admission symptoms, develop shortness of breath, life threatening emergency, suicidal or homicidal thoughts you must seek medical attention immediately by calling 911 or calling your MD immediately if symptoms less severe.  You must read complete instructions/literature along with all the possible adverse reactions/side effects for all the Medicines you take and that have been prescribed to you. Take any new Medicines after you have completely understood and accept all the possible adverse reactions/side effects.   Do not drive when taking pain medications.   Do not take more than prescribed Pain, Sleep and Anxiety Medications  Special Instructions: If you have smoked or chewed Tobacco in the last 2 yrs please stop smoking, stop any regular Alcohol and or any Recreational drug use.  Wear Seat belts while driving.  Please note  You were cared for by a hospitalist during your hospital stay. Once you are discharged, your primary care physician will handle any further medical issues. Please note that NO REFILLS for any discharge medications will be authorized once you are discharged, as it is imperative that you return to  your primary care physician (or establish a relationship with a primary care physician if you do not have one) for your aftercare needs so that they can reassess your need for medications and monitor your lab values.    The results of significant diagnostics from this hospitalization (including imaging, microbiology, ancillary and laboratory) are listed below for reference.    Significant Diagnostic Studies: Dg Chest 2 View  08/28/2015  CLINICAL DATA:  Acute onset of shortness of breath and mid chest pain. Initial encounter. EXAM: CHEST  2 VIEW COMPARISON:  Chest radiograph performed 08/03/2015 FINDINGS: The lungs are well-aerated. Vascular congestion is noted. Increased interstitial markings raise concern for mild interstitial edema. There is no evidence of pleural effusion or pneumothorax. The heart is borderline normal in size. No acute osseous abnormalities are seen. IMPRESSION: Vascular congestion noted. Increased interstitial markings raise concern for mild interstitial edema. Electronically Signed   By: Garald Balding M.D.   On: 08/28/2015 05:50   Dg Chest 2 View  08/03/2015  CLINICAL DATA:  Chest pain with nasal congestion and cough for 3 weeks EXAM: CHEST  2 VIEW COMPARISON:  May 15, 2007 FINDINGS: There is no edema or consolidation. The heart size and pulmonary vascularity are normal. No adenopathy. No bone lesions. No pneumothorax. IMPRESSION: No edema or consolidation. Electronically Signed   By: Lowella Grip III M.D.   On: 08/03/2015 17:47   Dg Abd 1 View  08/28/2015  CLINICAL DATA:  Abdominal bloating EXAM: ABDOMEN - 1 VIEW COMPARISON:  None. FINDINGS: There is moderate stool throughout colon. There is no bowel dilatation or air-fluid level suggesting obstruction. No free air is seen on this supine examination. There are small phleboliths in the pelvis. IMPRESSION: No demonstrable obstruction or free  air.  Moderate stool in colon. Electronically Signed   By: Lowella Grip III  M.D.   On: 08/28/2015 08:19   Ct Head Wo Contrast  08/03/2015  CLINICAL DATA:  50 year old male with headaches and hypertension. EXAM: CT HEAD WITHOUT CONTRAST TECHNIQUE: Contiguous axial images were obtained from the base of the skull through the vertex without intravenous contrast. COMPARISON:  None. FINDINGS: The ventricles and sulci are appropriate in size for patient's age. Minimal periventricular and deep white matter hypodensities represent chronic microvascular ischemic changes. There is no intracranial hemorrhage. No mass effect or midline shift identified. The visualized paranasal sinuses and mastoid air cells are well aerated. The calvarium is intact. IMPRESSION: No acute intracranial pathology. Electronically Signed   By: Anner Crete M.D.   On: 08/03/2015 23:23   Ct Chest Wo Contrast  08/29/2015  CLINICAL DATA:  Dyspnea on exertion, cough, congestion and difficulty breathing through the left nostril. Symptoms for 3-4 months. EXAM: CT CHEST WITHOUT CONTRAST TECHNIQUE: Multidetector CT imaging of the chest was performed following the standard protocol without IV contrast. COMPARISON:  PA and lateral chest 08/28/2015 and 05/15/2007. FINDINGS: There is mild cardiomegaly. No pericardial effusion. Small bilateral pleural effusions are identified. No axillary, hilar or mediastinal lymphadenopathy. No calcific coronary artery disease is identified. Mild aortic atherosclerosis is noted. The lungs demonstrate patchy airspace disease in the left upper lobe. Dependent atelectasis is also identified and more notable on the right. Visualized upper abdomen demonstrates no focal abnormality. There is some motion on images through the upper abdomen. No focal bony abnormality is identified. IMPRESSION: Patchy left upper lobe airspace disease has an appearance most concerning for bronchopneumonia. Small bilateral pleural effusions with associated basilar atelectasis. Mild cardiomegaly. Electronically Signed    By: Inge Rise M.D.   On: 08/29/2015 10:49   US Renal  08/04/2015  CLINICAL DATA:  Chronic kidney disease EXAM: RENAL / URINARY TRACT ULTRASOUND COMPLETE COMPARISON:  None. FINDINGS: Right Kidney: Length: 14.6 cm. Echogenicity within normal limits. No mass or hydronephrosis visualized. Left Kidney: Length: 13.9 cm. Echogenicity within normal limits. There is a 1.5 x 1.1 x 1.3 cm anechoic left renal mass most consistent with a cyst. No hydronephrosis visualized. Bladder: Appears normal for degree of bladder distention. IMPRESSION: 1. No obstructive uropathy. Electronically Signed   By: Kathreen Devoid   On: 08/04/2015 12:06   Ct Maxillofacial Wo Cm  08/29/2015  CLINICAL DATA:  Dyspnea on exertion, congestion, cough, and difficulty breathing through left nostril EXAM: CT MAXILLOFACIAL WITHOUT CONTRAST TECHNIQUE: Multidetector CT imaging of the maxillofacial structures was performed. Multiplanar CT image reconstructions were also generated. A small metallic BB was placed on the right temple in order to reliably differentiate right from left. COMPARISON:  None. FINDINGS: Mucous retention cyst measuring about 1.5 cm along the floor of the right maxillary sinus. Sphenoid sinuses are clear. Ethmoid air cells and frontal sinuses are clear. No significant abnormalities left maxillary sinus. Ostiomeatal complexes are patent bilaterally. There is moderate leftward nasal septum deviation. This causes mild to moderate relative narrowing of the left anterior nasal passage as compared to the right. No significant mucosal abnormality contributing to this. Turbinates appear symmetric bilaterally. IMPRESSION: Mild inflammatory change, chronic, right maxillary sinus. Moderate anterior left nasal passage narrowing due to septal deviation. Electronically Signed   By: Skipper Cliche M.D.   On: 08/29/2015 10:44    Microbiology: No results found for this or any previous visit (from the past 240 hour(s)).   Labs: Basic  Metabolic Panel:  Recent Labs Lab 08/26/15 1557 08/28/15 0547 08/28/15 0553 08/29/15 0624 08/30/15 0459  NA 141 140 142 141 141  K 5.6* 5.3* 5.1 4.9 4.6  CL 113* 113* 113* 113* 112*  CO2 17* 18*  --  20* 21*  GLUCOSE 76 117* 111* 87 126*  BUN 53* 46* 42* 49* 49*  CREATININE 3.88* 4.06* 4.10* 4.35* 4.35*  CALCIUM 8.3* 8.8*  --  8.8* 8.6*  PHOS  --   --   --   --  5.5*   Liver Function Tests:  Recent Labs Lab 08/26/15 1557 08/30/15 0459  AST 14  --   ALT 14  --   ALKPHOS 65  --   BILITOT 0.3  --   PROT 6.1  --   ALBUMIN 3.4* 2.6*   No results for input(s): LIPASE, AMYLASE in the last 168 hours. No results for input(s): AMMONIA in the last 168 hours. CBC:  Recent Labs Lab 08/28/15 0547 08/28/15 0553 08/29/15 0624 08/30/15 0459  WBC 10.7*  --  7.5 5.6  HGB 9.6* 9.5* 8.4* 8.3*  HCT 27.5* 28.0* 24.7* 24.8*  MCV 84.1  --  86.1 85.2  PLT 169  --  164 154   Cardiac Enzymes: No results for input(s): CKTOTAL, CKMB, CKMBINDEX, TROPONINI in the last 168 hours. BNP: BNP (last 3 results)  Recent Labs  08/28/15 0548  BNP 229.4*    ProBNP (last 3 results) No results for input(s): PROBNP in the last 8760 hours.  CBG:  Recent Labs Lab 08/30/15 1233 08/30/15 1622 08/30/15 2325 08/31/15 0745 08/31/15 1143  GLUCAP 123* 127* 113* 137* 116*       Signed:  Vernell Leep, MD, FACP, FHM. [facility] Pager [phone]  If 7PM-7AM, please contact night-coverage [email or web address] Password [email or web address] 08/31/2015, 12:10 PM

## 2015-08-31 NOTE — Discharge Instructions (Signed)
Estreimiento - Adultos (Constipation, Adult) Se llama constipacin cuando:  Elimina heces (defeca) menos de 3 veces por semana.  Tiene dificultad para defecar.  Las heces son secas y duras o son ms grandes que lo normal. CUIDADOS EN EL HOGAR   Consuma alimentos con alto contenido de [place]. Por ejemplo, frutas, vegetales, porotos y cereales integrales, como el arroz integral.  Evite los alimentos ricos en grasas y Location manager. Estos incluyen patatas fritas, hamburguesas, galletas, dulces y refrescos.  Si no consume suficientes alimentos ricos en fibras, tome productos que tengan agregado de fibra (suplementos).  Beba suficiente lquido para mantener el pis (orina) claro o de color amarillo plido.  Haga ejercicio en forma regular, o como lo indique su mdico.  Vaya al bao cuando sienta la necesidad de Landscape architect. No se aguante las ganas.  Solo tome los medicamentos que le haya indicado su mdico. No tome medicamentos que le ayuden a Landscape architect (laxantes) sin antes consultarlo con su mdico. SOLICITE AYUDA DE INMEDIATO SI:   Observa sangre brillante en las heces (materia fecal).  El estreimiento dura ms de 4 das o [place].  Tiene dolor en el vientre (abdominal) o el trasero (recto).  Las heces son delgadas (como un lpiz).  Pierde peso de [place] inexplicable. ASEGRESE DE QUE:   Comprende estas instrucciones.  Controlar su afeccin.  Recibir ayuda de inmediato si no mejora o si empeora.   Esta informacin no tiene Marine scientist el consejo del mdico. Asegrese de hacerle al mdico cualquier pregunta que tenga.   Document Released: 08/07/2010 Document Revised: 07/26/2014 Elsevier Interactive Patient Education 2016 [employer].  Edema (Edema) El edema es una acumulacin anormal de lquido en los tejidos del cuerpo. En parte se debe al efecto de la gravedad que atrae el lquido a la parte ms baja del cuerpo, lo que hace que la afeccin sea ms frecuente en las  piernas y los muslos (extremidades inferiores). A menudo, los pies y los tobillos se hinchan sin que Psychologist, prison and probation services, y es ms probable que esto ocurra a medida que envejece. Tambin es frecuente en los tejidos ms blandos, por ejemplo, alrededor [employer].  Cuando se comprime la zona afectada, el lquido puede moverse de Buyer, retail y es posible que quede una depresin durante unos instantes. Esta depresin se conoce como fvea.  CAUSAS  Hay muchas causas posibles de edema. Consumir grandes cantidades de sal y estar de pie o sentado durante mucho tiempo puede causar edema en las piernas y los tobillos. Cuando hace calor, el edema puede empeorar. Las causas mdicas frecuentes del edema incluyen:  Insuficiencia cardaca.  Enfermedad heptica.  Enfermedades renales.  Vasos sanguneos dbiles en las piernas.  Cncer.  Una lesin.  Embarazo.  Algunos medicamentos.  Obesidad. SNTOMAS  Generalmente, el edema es indoloro. La piel puede parecer hinchada o tener un aspecto brilloso.  DIAGNSTICO  Para poder diagnosticar el edema, el mdico le har preguntas sobre sus antecedentes mdicos y Optometrist un examen fsico. Es posible que tenga que hacerse estudios, por ejemplo, radiografas, un electrocardiograma o anlisis de sangre para Hydrographic surveyor la presencia de enfermedades que pueden causar edema.  TRATAMIENTO  El tratamiento del edema depende de la causa. Si tiene enfermedades cardacas, hepticas o renales, debe recibir el tratamiento adecuado para estas afecciones. El tratamiento general puede incluir:  Elevacin de la parte del cuerpo afectada por encima del nivel del corazn.  Compresin de la parte del cuerpo afectada. La presin que [place]  comprimen los tejidos y empujan el lquido de regreso a los vasos sanguneos. Esto evita que el lquido [employer] tejidos.  Restriccin de la ingesta de lquido y sal.  Administracin de pldoras para orinar  (diurticos). Estos medicamentos son adecuados solo para algunos tipos de edema. Extraen el lquido del organismo y [place] la frecuencia de la miccin. De este modo, se elimina el lquido y se reduce la hinchazn, pero los diurticos pueden tener efectos secundarios. Tome los diurticos solo como le indic el mdico. INSTRUCCIONES PARA EL CUIDADO EN EL [employer]   Mantenga la parte del cuerpo afectada por encima del nivel del corazn cuando se acueste.  No permanezca sentado quieto ni de pie durante perodos de tiempo prolongados.  No se ponga nada por debajo de las rodillas cuando se acueste.  No use ropa apretada ni ligas en los muslos.  Haga ejercicios con las piernas para que el lquido retorne a los vasos sanguneos. Esto puede ayudar a Forensic psychologist.  Use vendas o medias elsticas para reducir la hinchazn de los tobillos segn las indicaciones del mdico.  Illene Bolus una dieta con bajo contenido de sal para reducir el lquido si el mdico lo recomienda.  Tome los medicamentos solamente como se lo haya indicado el mdico. SOLICITE ATENCIN MDICA SI:   El edema no responde al tratamiento.  Tiene enfermedades cardacas, hepticas o renales y observa sntomas de edema.  Tiene edema en las piernas que no mejora despus de haberlas elevado.  Aumenta de peso de [place] repentina y sin motivo aparente. SOLICITE ATENCIN MDICA DE INMEDIATO SI:   Siente falta de aire o dolor en el pecho.  No puede respirar cuando se acuesta.  Tiene dolor, enrojecimiento o calor en las zonas hinchadas.  Tiene enfermedades cardacas, hepticas o renales y repentinamente tiene edema.  Tiene fiebre y los sntomas empeoran repentinamente. ASEGRESE DE QUE:   Comprende estas instrucciones.  Controlar su afeccin.  Recibir ayuda de inmediato si no mejora o si empeora.   Esta informacin no tiene Marine scientist el consejo del mdico. Asegrese de hacerle al mdico cualquier pregunta que  tenga.   Document Released: 07/05/2005 Document Revised: 07/26/2014 Elsevier Interactive Patient Education 2016 [place] de aire (Shortness of Breath) La falta de aire significa que tiene dificultad para respirar. Tambin puede significar que tiene un problema mdico. Debe solicitar atencin mdica de inmediato si siente que Risk manager. CAUSAS   Insuficiente oxgeno en el aire, como en las grandes alturas o un ambiente lleno de humo.  Ciertas enfermedades pulmonares, infecciones o problemas.  Enfermedades o afecciones cardacas, como angina de pecho o insuficiencia cardaca.  Nivel bajo de glbulos rojos (anemia).  Condicin fsica deficiente, que puede provocar falta de aire al hacer ejercicio.  Lesiones o entumecimiento en el pecho o en la espalda.  Tener sobrepeso.  Fumar.  Ansiedad, que puede hacer que sienta que no tiene aire suficiente. DIAGNSTICO  A menudo, los problemas mdicos graves se encuentran durante el examen fsico. Para determinar porqu sufre de falta de aire podrn realizarle diferentes pruebas. Entre ellas:  Radiografa de trax.  Pruebas funcionales respiratorias.  Anlisis de [place].  Un electrocardiograma (ECG).  Un electrocardiograma ambulatorio. Un ECG ambulatorio registra los patrones de los latidos cardacos durante 24horas.  Prueba de ejercicio.  Un ecocardiograma transtorcico (ETT). Durante IT trainer, se usan ondas sonoras para evaluar el flujo de la sangre a travs del corazn.  Un ecocardiograma transesofgico (ETE).  Diagnsticos por  imgenes. Puede ser que el mdico no encuentre una causa para su falta de aire despus del examen. En este caso, es importante que concurra a un examen de control, segn las indicaciones del mdico.  TRATAMIENTO  El tratamiento de la falta de aire depende de la causa de sus sntomas y Economist. INSTRUCCIONES PARA EL CUIDADO EN EL HOGAR   No fumar. Fumar es una  causa frecuente de la falta de aire. Si fuma, solicite ayuda para dejar de hacerlo.  Evite estar cerca de sustancias qumicas o de aquellas cosas que puedan interferir en la respiracin, como vapores de pinturas y polvo.  Descanse todo lo que sea necesario. Retome sus actividades habituales gradualmente.  Si le indicaron medicamentos, tmelos como se los han prescrito durante todo el tiempo indicado. Estos incluyen el oxgeno y los medicamentos inhalados.  Cumpla con todas las visitas de control, segn le indique su mdico. SOLICITE ATENCIN MDICA SI:   Su afeccin no mejora en el tiempo esperado.  Le cuesta hacer las actividades cotidianas, aun si ha descansado lo suficiente.  Aparece algn sntoma nuevo. [place] DE INMEDIATO SI:   La falta de aire empeora.  Se siente aturdido, se desmaya o tiene tos que no controla con medicamentos.  Elimina sangre al toser.  Siente dolor al respirar.  Tiene dolor de pecho o en los brazos, hombros o abdomen.  Tiene fiebre.  No logra subir escaleras o realizar ejercicio del modo en que lo haca habitualmente. ASEGRESE DE QUE:  Comprende estas instrucciones.  Controlar su afeccin.  Recibir ayuda de inmediato si no mejora o si empeora.   Esta informacin no tiene Marine scientist el consejo del mdico. Asegrese de hacerle al mdico cualquier pregunta que tenga.   Document Released: 04/14/2005 Document Revised: 07/10/2013 Elsevier Interactive Patient Education 2016 [place] renal crnica (Chronic Kidney Disease) La enfermedad renal crnica se produce cuando los riones sufren un dao durante un largo perodo. Los riones son dos rganos que se encuentran a cada lado de la columna vertebral entre el centro de la espalda y la parte de adelante del abdomen. Los riones:  [employer] y el exceso de agua de la [place].  Producen hormonas importantes. Ayudan a [employer], a regular la presin arterial y a formar glbulos rojos.  Regulan los lquidos y los productos qumicos en la sangre y los tejidos. Un dao pequeo puede no causar problemas, pero un dao mayor puede impedir que los riones funcionen de la [place]. Si no se toman medidas para frenar el dao renal o evitar que empeore, los riones pueden dejar de funcionar de forma [place]. La mayora de las veces, la enfermedad renal crnica no se cura. Sin embargo, muchas veces se puede Chief Technology Officer, y los que la sufren, por lo general pueden llevar una vida normal. CAUSAS Las causas ms comunes de la enfermedad renal crnica son la diabetes y la presin arterial alta (hipertensin). Otras causas de enfermedad renal crnica pueden ser:  [employer] filtros del rin.  Enfermedades que afectan el sistema inmunitario.  Enfermedades genticas.  Medicamentos que daan los riones, como los antiinflamatorios no esteroides [place]).  Envenenamiento o exposicin a sustancias txicas.  Una infeccin urinaria o renal recurrente.  Problemas en el flujo de orina. Las causas pueden ser las siguientes:  Hotel manager.  Clculos en el rin.  La prstata agrandada en los hombres. SIGNOS Y SNTOMAS Debido a que el dao renal  en la enfermedad renal crnica se produce gradualmente, los sntomas se desarrollan lentamente y pueden no ser evidentes hasta que el dao renal es grave. Una persona puede tener una enfermedad renal durante aos sin mostrar ningn sntoma. Entre los sntomas se pueden incluir los siguientes:  Hinchazn (edema) de las piernas, los tobillos o los pies.  Cansancio (letargo).  Nuseas o vmitos.  Confusin.  Problemas en la miccin, tales como:  Disminucin de la produccin de [place].  Aumento de los deseos de orinar, especialmente por la noche.  Accidentes frecuentes en los nios que saben ir al bao solos (controlan los esfnteres).  Contracciones o  calambres musculares.  Falta de aire.  Debilidad.  Picazn persistente.  Prdida del apetito.  Gusto metlico en la boca  Dificultad para dormir.  Movimientos lentos en los nios.  [place] [employer] DIAGNSTICO La enfermedad renal crnica se Emergency planning/management officer y diagnosticar mediante estudios que incluyen anlisis de [place], de [place], diagnsticos por imgenes, o una biopsia de rin. TRATAMIENTO La mayora de las enfermedades renales crnicas no pueden curarse. El tratamiento generalmente incluye el alivio de los sntomas y prevenir o Lexicographer la progresin de la enfermedad. El tratamiento puede incluir lo siguiente:  Una dieta especial. Es posible que tenga que evitar el alcohol y los alimentos con alto contenido de sal y Field seismologist.  Medicamentos. Pueden ser:  Controlar la presin arterial.  Mejorar la anemia.  Mejorar la hinchazn.  Proteger los [employer]. INSTRUCCIONES PARA EL CUIDADO EN EL HOGAR  Siga la dieta que le han indicado. El mdico puede indicarle que limite la ingesta diaria de sal (sodio) y protenas.  Tome los medicamentos solamente como se lo haya indicado el mdico. No utilice ningn medicamento nuevo (recetado, de venta libre o suplementos nutricionales), excepto si el mdico lo aprueba. Muchos medicamentos pueden empeorar la enfermedad renal, o ser necesario ajustar las dosis.   Si fuma, deje de hacerlo. Converse con su mdico acerca de los programas para dejar de fumar.  Concurra a todas las visitas de control como se lo haya indicado el mdico.  Contrlese la presin arterial.  Comience o contine un plan de ejercicios.  Aplquese las vacunas como se lo haya indicado el mdico.  Tome los suplementos vitamnicos y minerales como se lo haya indicado el mdico. SOLICITE ATENCIN MDICA DE INMEDIATO SI:  Empeora o desarrolla nuevos sntomas.  Aparecen sntomas de enfermedad renal en etapa terminal. Estos incluyen los siguientes:  Dolores  de [place].  La piel se oscurece o se aclara de manera anormal.  Entumecimiento en las manos o en los pies.  Aparecen hematomas con facilidad.  Hipo frecuente.  Se detiene la menstruacin.  Tiene fiebre.  Disminuye la produccin de [place].  Siente dolor o tiene una hemorragia al [employer]. ASEGRESE DE QUE:  Comprende estas instrucciones.  Controlar su afeccin.  Recibir ayuda de inmediato si no mejora o si empeora. PARA OBTENER MS INFORMACIN   Asociacin Americana de Pacientes Renales (American Association of Kidney Patients, AAKP): BombTimer.gl  [place]): www.kidney.org  Fondo Americano para Problemas Renales ([place]): https://mathis.com/  Programa de rehabilitacin de Life Options: www.lifeoptions.org y www.kidneyschool.org   Esta informacin no tiene Marine scientist el consejo del mdico. Asegrese de hacerle al mdico cualquier pregunta que tenga.   Document Released: 10/01/2008 Document Revised: 07/26/2014 Elsevier Interactive Patient Education [employer].

## 2015-09-02 ENCOUNTER — Encounter (HOSPITAL_COMMUNITY): Payer: Self-pay | Admitting: *Deleted

## 2015-09-02 ENCOUNTER — Other Ambulatory Visit: Payer: Self-pay

## 2015-09-02 LAB — RESPIRATORY VIRUS PANEL
ADENOVIRUS: NEGATIVE
INFLUENZA A: NEGATIVE
Influenza B: NEGATIVE
Metapneumovirus: NEGATIVE
PARAINFLUENZA 2 A: NEGATIVE
Parainfluenza 1: NEGATIVE
Parainfluenza 3: NEGATIVE
RESPIRATORY SYNCYTIAL VIRUS A: NEGATIVE
RESPIRATORY SYNCYTIAL VIRUS B: NEGATIVE
Rhinovirus: NEGATIVE

## 2015-09-02 NOTE — Progress Notes (Signed)
Using [employer], [place] ID# [id number], w called home number- no answer and no voice mail.  We also called mobile - no answer, Eric Glenn left a message that we are from [facility] hospital attempting to reach him regarding surgery on 09/04/15 and that someone would call him back on Wednesday.

## 2015-09-03 MED ORDER — CHLORHEXIDINE GLUCONATE CLOTH 2 % EX PADS: 6.0000 | MEDICATED_PAD | Freq: Once | CUTANEOUS | Status: DC

## 2015-09-03 MED ORDER — DEXTROSE 5 % IV SOLN
1.5000 g | INTRAVENOUS | Status: AC
  Administered 2015-09-04: 1.5 g via INTRAVENOUS
  Filled 2015-09-03: qty 1.5

## 2015-09-03 MED ORDER — SODIUM CHLORIDE 0.9 % IV SOLN: INTRAVENOUS | Status: DC

## 2015-09-03 NOTE — Anesthesia Preprocedure Evaluation (Addendum)
Anesthesia Evaluation  Patient identified by MRN, date of birth, ID band Patient awake    Reviewed: Allergy & Precautions, NPO status , Patient's Chart, lab work & pertinent test results, reviewed documented beta blocker date and time   History of Anesthesia Complications Negative for: history of anesthetic complications  Airway Mallampati: II  TM Distance: >3 FB Neck ROM: Full    Dental  (+) Dental Advisory Given, Poor Dentition, Missing   Pulmonary neg pulmonary ROS, sleep apnea (possible) ,    breath sounds clear to auscultation       Cardiovascular hypertension, Pt. on medications and Pt. on home beta blockers + DOE   Rhythm:Regular Rate:Normal  1/17 ECHO:  EF 50-55%, valves OK   Neuro/Psych negative neurological ROS     GI/Hepatic negative GI ROS, Neg liver ROS,   Endo/Other  diabetes (glu 76), Oral Hypoglycemic AgentsMorbid obesity  Renal/GU ESRFRenal disease (creat 4.35, K+ 4.6)     Musculoskeletal   Abdominal (+) + obese,   Peds  Hematology Hb 8.3   Anesthesia Other Findings Single upper tooth, left incisor is noticeably lose.  Reproductive/Obstetrics                          Anesthesia Physical Anesthesia Plan  ASA: III  Anesthesia Plan: MAC   Post-op Pain Management:    Induction: Intravenous  Airway Management Planned: Natural Airway and Simple Face Mask  Additional Equipment:   Intra-op Plan:   Post-operative Plan:   Informed Consent:   Dental advisory given  Plan Discussed with: CRNA and Surgeon  Anesthesia Plan Comments: (Plan routine monitors, MAC)        Anesthesia Quick Evaluation

## 2015-09-03 NOTE — Progress Notes (Signed)
After several unsuccessful attempts to contact pt, pre-op instructions were left on pt mobile voicemail using Automotive engineer # [phone]. Please complete pt assessment on DOS. Instructions were left to stop otc vitamins, fish oil ( [place]) and to take no Glipizide tonight ( last dose should have been lunch dose today) or the morning of surgery.

## 2015-09-04 ENCOUNTER — Ambulatory Visit: Payer: Self-pay | Admitting: Pharmacist

## 2015-09-04 ENCOUNTER — Ambulatory Visit (HOSPITAL_COMMUNITY): Payer: Self-pay | Admitting: Anesthesiology

## 2015-09-04 ENCOUNTER — Other Ambulatory Visit: Payer: Self-pay | Admitting: *Deleted

## 2015-09-04 ENCOUNTER — Ambulatory Visit (HOSPITAL_COMMUNITY)
Admission: RE | Admit: 2015-09-04 | Discharge: 2015-09-04 | Disposition: A | Discharge status: Home / Self Care | Payer: Self-pay | Source: Ambulatory Visit | Attending: Vascular Surgery | Admitting: Vascular Surgery

## 2015-09-04 ENCOUNTER — Encounter (HOSPITAL_COMMUNITY)
Admission: RE | Disposition: A | Discharge status: Home / Self Care | Payer: Self-pay | Source: Ambulatory Visit | Attending: Vascular Surgery

## 2015-09-04 ENCOUNTER — Encounter (HOSPITAL_COMMUNITY): Payer: Self-pay

## 2015-09-04 DIAGNOSIS — Z6839 Body mass index (BMI) 39.0-39.9, adult: Secondary | ICD-10-CM | POA: Insufficient documentation

## 2015-09-04 DIAGNOSIS — I129 Hypertensive chronic kidney disease with stage 1 through stage 4 chronic kidney disease, or unspecified chronic kidney disease: Secondary | ICD-10-CM | POA: Insufficient documentation

## 2015-09-04 DIAGNOSIS — E785 Hyperlipidemia, unspecified: Secondary | ICD-10-CM | POA: Insufficient documentation

## 2015-09-04 DIAGNOSIS — N186 End stage renal disease: Secondary | ICD-10-CM

## 2015-09-04 DIAGNOSIS — N184 Chronic kidney disease, stage 4 (severe): Secondary | ICD-10-CM | POA: Insufficient documentation

## 2015-09-04 DIAGNOSIS — D649 Anemia, unspecified: Secondary | ICD-10-CM | POA: Insufficient documentation

## 2015-09-04 DIAGNOSIS — K5900 Constipation, unspecified: Secondary | ICD-10-CM | POA: Insufficient documentation

## 2015-09-04 DIAGNOSIS — E11319 Type 2 diabetes mellitus with unspecified diabetic retinopathy without macular edema: Secondary | ICD-10-CM | POA: Insufficient documentation

## 2015-09-04 DIAGNOSIS — Z4931 Encounter for adequacy testing for hemodialysis: Secondary | ICD-10-CM

## 2015-09-04 DIAGNOSIS — E1122 Type 2 diabetes mellitus with diabetic chronic kidney disease: Secondary | ICD-10-CM | POA: Insufficient documentation

## 2015-09-04 HISTORY — DX: Anemia, unspecified: D64.9

## 2015-09-04 HISTORY — DX: Type 2 diabetes mellitus with unspecified diabetic retinopathy without macular edema: E11.319

## 2015-09-04 HISTORY — PX: AV FISTULA PLACEMENT: SHX1204

## 2015-09-04 LAB — GLUCOSE, CAPILLARY
GLUCOSE-CAPILLARY: 76 mg/dL (ref 65–99)
Glucose-Capillary: 90 mg/dL (ref 65–99)

## 2015-09-04 LAB — POCT I-STAT 4, (NA,K, GLUC, HGB,HCT)
GLUCOSE: 82 mg/dL (ref 65–99)
HEMATOCRIT: 30 % — AB (ref 39.0–52.0)
Hemoglobin: 10.2 g/dL — ABNORMAL LOW (ref 13.0–17.0)
POTASSIUM: 4.6 mmol/L (ref 3.5–5.1)
SODIUM: 140 mmol/L (ref 135–145)

## 2015-09-04 SURGERY — ARTERIOVENOUS (AV) FISTULA CREATION
Anesthesia: Monitor Anesthesia Care | Site: Arm Lower | Laterality: Right

## 2015-09-04 MED ORDER — SODIUM CHLORIDE 0.9 % IV SOLN
INTRAVENOUS | Status: DC | PRN
  Administered 2015-09-04: 07:00:00 via INTRAVENOUS

## 2015-09-04 MED ORDER — MIDAZOLAM HCL 2 MG/2ML IJ SOLN: 0.5000 mg | Freq: Once | INTRAMUSCULAR | Status: DC | PRN

## 2015-09-04 MED ORDER — PROPOFOL 500 MG/50ML IV EMUL: INTRAVENOUS | Status: DC | PRN

## 2015-09-04 MED ORDER — LIDOCAINE HCL (PF) 1 % IJ SOLN
INTRAMUSCULAR | Status: AC
  Filled 2015-09-04: qty 30

## 2015-09-04 MED ORDER — SODIUM CHLORIDE 0.9 % IV SOLN: 0.0500 mg/kg/h | INTRAVENOUS | Status: DC

## 2015-09-04 MED ORDER — FENTANYL CITRATE (PF) 100 MCG/2ML IJ SOLN: 25.0000 ug | INTRAMUSCULAR | Status: DC | PRN

## 2015-09-04 MED ORDER — LIDOCAINE HCL (PF) 1 % IJ SOLN
INTRAMUSCULAR | Status: DC | PRN
Start: 2015-09-04 — End: 2015-09-04
  Administered 2015-09-04: 13.5 mL

## 2015-09-04 MED ORDER — SODIUM CHLORIDE 0.9 % IJ SOLN
INTRAMUSCULAR | Status: DC | PRN
  Administered 2015-09-04: 500 mL

## 2015-09-04 MED ORDER — FENTANYL CITRATE (PF) 250 MCG/5ML IJ SOLN
INTRAMUSCULAR | Status: AC
  Filled 2015-09-04: qty 5

## 2015-09-04 MED ORDER — LIDOCAINE-EPINEPHRINE (PF) 1 %-1:200000 IJ SOLN
INTRAMUSCULAR | Status: AC
  Filled 2015-09-04: qty 30

## 2015-09-04 MED ORDER — MIDAZOLAM HCL 2 MG/2ML IJ SOLN
INTRAMUSCULAR | Status: AC
  Filled 2015-09-04: qty 2

## 2015-09-04 MED ORDER — MIDAZOLAM HCL 5 MG/5ML IJ SOLN
INTRAMUSCULAR | Status: DC | PRN
  Administered 2015-09-04: 1 mg via INTRAVENOUS

## 2015-09-04 MED ORDER — PROPOFOL 500 MG/50ML IV EMUL
INTRAVENOUS | Status: DC | PRN
  Administered 2015-09-04: 25 ug/kg/min via INTRAVENOUS

## 2015-09-04 MED ORDER — PROMETHAZINE HCL 25 MG/ML IJ SOLN: 6.2500 mg | INTRAMUSCULAR | Status: DC | PRN

## 2015-09-04 MED ORDER — OXYCODONE-ACETAMINOPHEN 5-325 MG PO TABS: 1.0000 | ORAL_TABLET | ORAL | Status: DC | PRN

## 2015-09-04 MED ORDER — BIVALIRUDIN LOAD/BOLUS VIA INFUSION (OHS/POSSIBLE HIT) OPTIME
1.0000 mg/kg | Freq: Once | INTRAVENOUS | Status: AC
Start: 2015-09-04 — End: 2015-09-04
  Administered 2015-09-04: 71 mg via INTRAVENOUS
  Filled 2015-09-04: qty 95

## 2015-09-04 MED ORDER — MEPERIDINE HCL 25 MG/ML IJ SOLN: 6.2500 mg | INTRAMUSCULAR | Status: DC | PRN

## 2015-09-04 MED ORDER — FENTANYL CITRATE (PF) 100 MCG/2ML IJ SOLN
INTRAMUSCULAR | Status: DC | PRN
  Administered 2015-09-04: 50 ug via INTRAVENOUS

## 2015-09-04 MED ORDER — SODIUM CHLORIDE 0.9 % IV SOLN
0.1500 mg/kg/h | INTRAVENOUS | Status: DC
  Filled 2015-09-04: qty 250

## 2015-09-04 MED ORDER — PROPOFOL 10 MG/ML IV BOLUS
INTRAVENOUS | Status: AC
  Filled 2015-09-04: qty 40

## 2015-09-04 MED ORDER — 0.9 % SODIUM CHLORIDE (POUR BTL) OPTIME
TOPICAL | Status: DC | PRN
  Administered 2015-09-04: 1000 mL

## 2015-09-04 SURGICAL SUPPLY — 32 items
ARMBAND PINK RESTRICT EXTREMIT (MISCELLANEOUS) ×3 IMPLANT
CANISTER SUCTION 2500CC (MISCELLANEOUS) ×3 IMPLANT
CANNULA VESSEL 3MM 2 BLNT TIP (CANNULA) ×3 IMPLANT
CLIP TI MEDIUM 6 (CLIP) ×3 IMPLANT
CLIP TI WIDE RED SMALL 6 (CLIP) ×6 IMPLANT
COVER PROBE W GEL 5X96 (DRAPES) IMPLANT
DECANTER SPIKE VIAL GLASS SM (MISCELLANEOUS) ×3 IMPLANT
ELECT REM PT RETURN 9FT ADLT (ELECTROSURGICAL) ×3
ELECTRODE REM PT RTRN 9FT ADLT (ELECTROSURGICAL) ×1 IMPLANT
GLOVE BIO SURGEON STRL SZ7.5 (GLOVE) ×3 IMPLANT
GLOVE BIOGEL PI IND STRL 6.5 (GLOVE) ×1 IMPLANT
GLOVE BIOGEL PI IND STRL 7.5 (GLOVE) ×1 IMPLANT
GLOVE BIOGEL PI IND STRL 8 (GLOVE) ×1 IMPLANT
GLOVE BIOGEL PI INDICATOR 6.5 (GLOVE) ×2
GLOVE BIOGEL PI INDICATOR 7.5 (GLOVE) ×2
GLOVE BIOGEL PI INDICATOR 8 (GLOVE) ×2
GLOVE ECLIPSE 7.0 STRL STRAW (GLOVE) ×3 IMPLANT
GOWN STRL REUS W/ TWL LRG LVL3 (GOWN DISPOSABLE) ×3 IMPLANT
GOWN STRL REUS W/TWL LRG LVL3 (GOWN DISPOSABLE) ×6
KIT BASIN OR (CUSTOM PROCEDURE TRAY) ×3 IMPLANT
KIT ROOM TURNOVER OR (KITS) ×3 IMPLANT
LIQUID BAND (GAUZE/BANDAGES/DRESSINGS) ×6 IMPLANT
NS IRRIG 1000ML POUR BTL (IV SOLUTION) ×3 IMPLANT
PACK CV ACCESS (CUSTOM PROCEDURE TRAY) ×3 IMPLANT
PAD ARMBOARD 7.5X6 YLW CONV (MISCELLANEOUS) ×6 IMPLANT
SPONGE SURGIFOAM ABS GEL 100 (HEMOSTASIS) IMPLANT
SUT PROLENE 6 0 BV (SUTURE) ×3 IMPLANT
SUT VIC AB 3-0 SH 27 (SUTURE) ×2
SUT VIC AB 3-0 SH 27X BRD (SUTURE) ×1 IMPLANT
SUT VICRYL 4-0 PS2 18IN ABS (SUTURE) ×3 IMPLANT
UNDERPAD 30X30 INCONTINENT (UNDERPADS AND DIAPERS) ×3 IMPLANT
WATER STERILE IRR 1000ML POUR (IV SOLUTION) ×3 IMPLANT

## 2015-09-04 NOTE — OR Nursing (Signed)
Patient stated during pre-op assessment that he has a pork allergy. Heparin is derived from porcine.  Angiomax was given by CRNA intra-operatively instead of Heparin Saline per Dr. Scot Dock.

## 2015-09-04 NOTE — Op Note (Signed)
    [name]  MRN: [id number] DOB: 1965-12-11    DATE OF OPERATION: 09/04/2015  PREOP DIAGNOSIS: Stage IV chronic kidney disease  POSTOP DIAGNOSIS: Same  PROCEDURE: Right radial cephalic AV fistula  SURGEON: Judeth Cornfield. Scot Dock, MD, FACS  ASSIST: Gerri Lins PA  ANESTHESIA: local with sedation   EBL: minimal  INDICATIONS: Eric Glenn is a 50 y.o. male who is not yet on dialysis. He presents for new access.  FINDINGS: 3 mm forearm cephalic vein  TECHNIQUE: The patient was taken to the operating room and sedated by anesthesia. The right upper extremity was prepped and draped in usual sterile fashion. The vein was fairly far lateral and therefore I decided to make 2 separate incisions, one over the vein, and one over the radial artery. After the skin was anesthetized. A longitudinal incision was made over the cephalic vein just above the wrist. Here the vein was dissected free and divided distally. It was irrigated out with saline and was about a 3 mm vein. I thought it was reasonable to attempt a radiocephalic fistula. After the skin was anesthetized, a separate longitudinal incision was made over the radial artery. It was a good size artery that was free of plaque. The patient then received Angiomax as he has an allergy to pork and I was has attempt to give him heparin. The radial artery was clamped proximally and distally and a longitudinal arteriotomy was made. The vein was slightly spatulated and sewn end to side to the artery using continuous 6-0 Prolene suture. At the completion was a good thrill in the fistula. Hemostasis was obtained in the wounds.The wounds were then closed with a deep layer of interrupted 3-0 Vicryl and the skin closed with 4-0 Vicryl. Liquid band was applied. The patient tolerated the procedure well and was transferred to the recovery room in stable condition. All needle and sponge counts were correct.  [name], MD,  FACS [facility]  DATE OF DICTATION:   09/04/2015

## 2015-09-04 NOTE — Progress Notes (Signed)
   09/04/15 0733  OBSTRUCTIVE SLEEP APNEA  Have you ever been diagnosed with sleep apnea through a sleep study? No  If yes, do you have and use a CPAP or BPAP machine every night? 0  Do you snore loudly (loud enough to be heard through closed doors)?  1  Do you often feel tired, fatigued, or sleepy during the daytime (such as falling asleep during driving or talking to someone)? 1  Has anyone observed you stop breathing during your sleep? 1  Do you have, or are you being treated for high blood pressure? 1  BMI more than 35 kg/m2? 1  Age > 50 (1-yes) 0  Neck circumference greater than:Male 16 inches or larger, Male 17inches or larger? 1  Male Gender (Yes=1) 1  Obstructive Sleep Apnea Score 7  Score 5 or greater  Results sent to PCP

## 2015-09-04 NOTE — Progress Notes (Signed)
Interpreter Lesle Chris for PACU

## 2015-09-04 NOTE — Transfer of Care (Signed)
Immediate Anesthesia Transfer of Care Note  Patient: Eric Glenn  Procedure(s) Performed: Procedure(s): ARTERIOVENOUS (AV) FISTULA CREATION (Right)  Patient Location: PACU  Anesthesia Type:MAC  Level of Consciousness: awake, alert , oriented and patient cooperative  Airway & Oxygen Therapy: Patient Spontanous Breathing  Post-op Assessment: Report given to RN, Post -op Vital signs reviewed and stable and Patient moving all extremities  Post vital signs: Reviewed and stable  Last Vitals:  Filed Vitals:   09/04/15 0641  BP: 186/96  Pulse: 70  Temp: 36.2 C  Resp: 18    Complications: No apparent anesthesia complications

## 2015-09-04 NOTE — Anesthesia Postprocedure Evaluation (Addendum)
Anesthesia Post Note  Patient: Eric Glenn  Procedure(s) Performed: Procedure(s) (LRB): ARTERIOVENOUS (AV) FISTULA CREATION (Right)  Patient location during evaluation: PACU Anesthesia Type: MAC Level of consciousness: awake and alert, oriented and patient cooperative Pain management: pain level controlled Vital Signs Assessment: post-procedure vital signs reviewed and stable Respiratory status: spontaneous breathing, nonlabored ventilation and respiratory function stable Cardiovascular status: blood pressure returned to baseline and stable Postop Assessment: no signs of nausea or vomiting Anesthetic complications: no    Last Vitals:  Filed Vitals:   09/04/15 0940 09/04/15 0945  BP: 138/74 133/71  Pulse: 66 65  Temp: 36.8 C   Resp: 14 17    Last Pain:  Filed Vitals:   09/04/15 0956  PainSc: 0-No pain                 Haiden Clucas,E. Consuello Lassalle

## 2015-09-04 NOTE — Addendum Note (Signed)
Addendum  created 09/04/15 1731 by Annye Asa, MD   Modules edited: Clinical Notes   Clinical Notes:  File: [id number]

## 2015-09-04 NOTE — H&P (View-Only) (Signed)
   VASCULAR SURGERY ASSESSMENT & PLAN:  * Vein map reviewed. It appears that his best vein for a fistula is in the right arm. I have recommended placement of a right radiocephalic or brachiocephalic fistula. He has an IV in the left arm so we will leave that. I will try to schedule his surgery for Tuesday. I discussed this plan through the niece who translates for them.    SUBJECTIVE: No complaints.  PHYSICAL EXAM: Filed Vitals:   08/30/15 1619 08/30/15 2242 08/31/15 0558 08/31/15 0745  BP: 156/82 151/79 139/74 141/73  Pulse: 62 58 62 63  Temp: 98.3 F (36.8 C) 98.4 F (36.9 C) 98.4 F (36.9 C) 98.6 F (37 C)  TempSrc: Oral Oral Oral Oral  Resp: 18 16 18 18   Height:      Weight:      SpO2: 97% 97% 97% 98%   Palpable right radial pulse.  LABS: Lab Results  Component Value Date   WBC 5.6 08/30/2015   HGB 8.3* 08/30/2015   HCT 24.8* 08/30/2015   MCV 85.2 08/30/2015   PLT 154 08/30/2015   Lab Results  Component Value Date   CREATININE 4.35* 08/30/2015   VEIN MAP: The diameters of the forearm cephalic vein on the right range from 2.9 mm-3.4 mm. The upper arm cephalic vein has diameters ranging from 4.0 mm-4.6 mm. The cephalic vein in the left arm is smaller.  CBG (last 3)   Recent Labs  08/30/15 1622 08/30/15 2325 08/31/15 0745  GLUCAP 127* 113* 137*    Principal Problem:   DOE (dyspnea on exertion) Active Problems:   Acute renal insufficiency   Essential hypertension   Diabetes mellitus type 2, controlled ([place])   Diabetic retinopathy associated with type 2 diabetes mellitus (HCC)   Proteinuria   Hyperlipidemia   Chronic kidney disease, stage IV (severe) (HCC)   Normocytic anemia   Suspected OSA    Constipation   Acute respiratory failure with hypoxia ([place])   Orthopnea   Shortness of breath   Gae Gallop Beeper: [id number] 08/31/2015

## 2015-09-04 NOTE — Progress Notes (Signed)
Interpreter Lesle Chris for pre-surgery at short stay

## 2015-09-04 NOTE — Interval H&P Note (Signed)
History and Physical Interval Note:  09/04/2015 7:21 AM  Eric Glenn  has presented today for surgery, with the diagnosis of Stage IV Chronic Kidney Disease N18.4  The various methods of treatment have been discussed with the patient and family. After consideration of risks, benefits and other options for treatment, the patient has consented to  Procedure(s): ARTERIOVENOUS (AV) FISTULA CREATION (Right) as a surgical intervention .  The patient's history has been reviewed, patient examined, no change in status, stable for surgery.  I have reviewed the patient's chart and labs.  Questions were answered to the patient's satisfaction.     [name]

## 2015-09-05 ENCOUNTER — Telehealth: Payer: Self-pay | Admitting: Vascular Surgery

## 2015-09-05 NOTE — Telephone Encounter (Signed)
-----   Message from Mena Goes, RN sent at 09/04/2015  9:50 AM EST ----- Regarding: schedule   ----- Message -----    From: Angelia Mould, MD    Sent: 09/04/2015   9:05 AM      To: [facility] Charge Pool Subject: charge                                         PROCEDURE: Right radial cephalic AV fistula  SURGEON: Judeth Cornfield. Scot Dock, MD, FACS  ASSIST: Gerri Lins PA  He will need a follow up visit in 6 weeks with a duplex to check on the maturation of his fistula. Thank you. CD

## 2015-09-05 NOTE — Telephone Encounter (Signed)
LM for pt with RN at [facility] when pt was admitted. Also mailed letters, dpm

## 2015-09-08 ENCOUNTER — Ambulatory Visit: Payer: Self-pay | Attending: Family Medicine | Admitting: Family Medicine

## 2015-09-08 ENCOUNTER — Encounter (HOSPITAL_COMMUNITY): Payer: Self-pay | Admitting: Vascular Surgery

## 2015-09-08 VITALS — BP 153/84 | HR 65 | Temp 99.1°F | Resp 15 | Ht 66.5 in | Wt 188.1 lb

## 2015-09-08 DIAGNOSIS — M7989 Other specified soft tissue disorders: Secondary | ICD-10-CM | POA: Insufficient documentation

## 2015-09-08 DIAGNOSIS — G4733 Obstructive sleep apnea (adult) (pediatric): Secondary | ICD-10-CM

## 2015-09-08 DIAGNOSIS — N179 Acute kidney failure, unspecified: Secondary | ICD-10-CM | POA: Insufficient documentation

## 2015-09-08 DIAGNOSIS — R600 Localized edema: Secondary | ICD-10-CM

## 2015-09-08 DIAGNOSIS — E1122 Type 2 diabetes mellitus with diabetic chronic kidney disease: Secondary | ICD-10-CM

## 2015-09-08 DIAGNOSIS — I129 Hypertensive chronic kidney disease with stage 1 through stage 4 chronic kidney disease, or unspecified chronic kidney disease: Secondary | ICD-10-CM | POA: Insufficient documentation

## 2015-09-08 DIAGNOSIS — E785 Hyperlipidemia, unspecified: Secondary | ICD-10-CM | POA: Insufficient documentation

## 2015-09-08 DIAGNOSIS — I10 Essential (primary) hypertension: Secondary | ICD-10-CM

## 2015-09-08 DIAGNOSIS — Z79899 Other long term (current) drug therapy: Secondary | ICD-10-CM | POA: Insufficient documentation

## 2015-09-08 DIAGNOSIS — N184 Chronic kidney disease, stage 4 (severe): Secondary | ICD-10-CM | POA: Insufficient documentation

## 2015-09-08 LAB — GLUCOSE, POCT (MANUAL RESULT ENTRY): POC GLUCOSE: 179 mg/dL — AB (ref 70–99)

## 2015-09-08 NOTE — Progress Notes (Signed)
Patient here for follow up Needs refill on amlodipine and pravastatin Patient denies pain

## 2015-09-09 MED FILL — ?FUROSEMIDE 20 MG TABLET: 20 | 30 days supply | Qty: 30 | Fill #0

## 2015-09-09 NOTE — Progress Notes (Signed)
Subjective:  Patient ID: Eric Glenn, male    DOB: 04/07/1966  Age: 50 y.o. MRN: [id number]  CC: Follow-up   HPI Eric Glenn today 50 year old man with a history of type 2 diabetes mellitus (A1c 7.6), hypertension, stage IV chronic kidney disease, hyperlipidemia recently hospitalized from 08/28/15 through 08/31/15 for acute on chronic renal failure.  He had presented with worsening dyspnea, orthopnea and pedal edema,prior hospitalization in 07/2015 for renal insufficiency. Creatinine worsened and trended up to a peak of 4.35, renal ultrasound was negative for hydronephrosis. CT chest revealed patchy left upper lobe is based disease was concerning for bronchopneumonia and mild cardiomegaly. 2-D echo from 07/2015 revealed ejection fraction of 50-55%. He was commenced on Lasix for pedal edema and was closely followed by nephrology who felt he would need hemodialysis in the near future Dyspnea/orthopnea was thought to be secondary to sleep apnea symptoms as he did well on his CPAP at bedtime (seen by pulmonary who had low suspicion for pneumonia) ; CT maxillofacial revealed narrowing of left nasal  due to septal deviation and he was scheduled for a follow-up with ENT outpatient. His condition improved and he was subsequently discharged to follow-up with nephrology  Interval history After discharge he had a placement of a right radiocephalic AV fistula by vascular surgery, he reports mild pain at the site. He denies any shortness of breath and states his pedal edema has resolved at this time. He does not have a follow-up appointment with nephrology.  Outpatient Prescriptions Prior to Visit  Medication Sig Dispense Refill  . amLODipine (NORVASC) 10 MG tablet Take 1 tablet (10 mg total) by mouth daily. 30 tablet 2  . Blood Glucose Monitoring Suppl (TRUE METRIX METER) DEVI 1 each by Does not apply route 3 (three) times daily before meals. 1 Device 0  . carvedilol (COREG) 25 MG  tablet Take 1 tablet (25 mg total) by mouth 2 (two) times daily with a meal. 60 tablet 2  . furosemide (LASIX) 80 MG tablet Take 1 tablet (80 mg total) by mouth 2 (two) times daily. 30 tablet 0  . glipiZIDE (GLUCOTROL) 5 MG tablet Take 1 tablet (5 mg total) by mouth 2 (two) times daily before a meal. 60 tablet 2  . glucose blood (TRUE METRIX BLOOD GLUCOSE TEST) test strip Use three times daily before meals 100 each 12  . lactulose (CHRONULAC) 10 GM/15ML solution Take 15 mLs (10 g total) by mouth 2 (two) times daily as needed for mild constipation. 946 mL 2  . omega-3 acid ethyl esters (LOVAZA) 1 g capsule Take 1 capsule (1 g total) by mouth 2 (two) times daily. 60 capsule 1  . OVER THE COUNTER MEDICATION Place 1 spray into both nostrils daily as needed (congestion). Over the counter nasal spray    . pravastatin (PRAVACHOL) 40 MG tablet Take 1 tablet (40 mg total) by mouth daily at 6 PM. 30 tablet 2  . TRUEPLUS LANCETS 28G MISC 1 each by Does not apply route 3 (three) times daily before meals. 100 each 12  . loratadine (CLARITIN) 10 MG tablet Take 1 tablet (10 mg total) by mouth daily. (Patient not taking: Reported on 09/08/2015) 10 tablet 0  . oxyCODONE-acetaminophen (ROXICET) 5-325 MG tablet Take 1-2 tablets by mouth every 4 (four) hours as needed for moderate pain. (Patient not taking: Reported on 09/08/2015) 30 tablet 0   No facility-administered medications prior to visit.    ROS Review of Systems  Constitutional: Negative for activity change and  appetite change.  HENT: Negative for sinus pressure and sore throat.   Eyes: Negative for visual disturbance.  Respiratory: Negative for cough, chest tightness and shortness of breath.   Cardiovascular: Negative for chest pain and leg swelling.  Gastrointestinal: Negative for abdominal pain, diarrhea, constipation and abdominal distention.  Endocrine: Negative.   Genitourinary: Negative for dysuria.  Musculoskeletal: Negative for myalgias and  joint swelling.  Skin: Negative for rash.  Allergic/Immunologic: Negative.   Neurological: Negative for weakness, light-headedness and numbness.  Psychiatric/Behavioral: Negative for suicidal ideas and dysphoric mood.    Objective:  BP 153/84 mmHg  Pulse 65  Temp(Src) 99.1 F (37.3 C)  Resp 15  Ht 5' 6.5" (1.689 m)  Wt 188 lb 1.6 oz (85.322 kg)  BMI 29.91 kg/m2  SpO2 95%  BP/Weight 09/08/2015 09/04/2015 [id number]  Systolic BP 0000000 Q000111Q Q000111Q  Diastolic BP 84 71 73  Wt. (Lbs) 188.1 208.56 -  BMI 29.91 39.43 -      Physical Exam  Constitutional: He is oriented to person, place, and time. He appears well-developed and well-nourished.  Neck: Normal range of motion. No JVD present.  Cardiovascular: Normal rate, normal heart sounds and intact distal pulses.   No murmur heard. Pulmonary/Chest: Effort normal and breath sounds normal. He has no wheezes. He has no rales. He exhibits no tenderness.  Abdominal: Soft. Bowel sounds are normal. He exhibits no distension and no mass. There is no tenderness.  Musculoskeletal: Normal range of motion.  Right radial cephalic AV fistula  Neurological: He is alert and oriented to person, place, and time.  Skin: Skin is warm and dry.  Psychiatric: He has a normal mood and affect.     Assessment & Plan:   1. Type 2 diabetes mellitus with stage 4 chronic kidney disease, without long-term current use of insulin (HCC) A1c 7.6 Continue current medications - Glucose (CBG)  2. Chronic kidney disease, stage IV (severe) (HCC) Status post AV fistula placement Renal function has been worsening especially with the patient being on Lasix [facility] to obtain an appointment with nephrology.  3. Pedal edema  Pedal edema has resolved. The patient is currently on huge doses of Lasix  4. Hypertension Blood pressure is slightly elevated above goal of less than 140/90 Advised on low-sodium diet and compliance with antihypertensives  5.  Suspected OSA  He will benefit from a CPAP machine but we will need a sleep study to make a diagnosis - Split night study; Future  Will need repeat CT chest in a few months to follow-up on pulmonary infiltrates.  Financial counselors called in to see the patient given he has no medical coverage and he seems to be Medicaid pending; we will need this for [facility] [place] [facility] discounts to facilitate his ability to see specialist and have diagnostic testing.   No orders of the defined types were placed in this encounter.    Follow-up: Return in about 1 month (around 10/06/2015) for Follow-up on diabetes mellitus.   Arnoldo Morale MD

## 2015-09-12 ENCOUNTER — Encounter: Payer: Self-pay | Admitting: Clinical

## 2015-09-12 NOTE — Progress Notes (Signed)
Depression screen [facility] 2/9 09/08/2015 08/26/2015 08/19/2015 08/19/2015  Decreased Interest 0 1 1 1   Down, Depressed, Hopeless 1 1 1 1   PHQ - 2 Score 1 2 2 2   Altered sleeping - 0 - -  Tired, decreased energy - 0 - -  Change in appetite - 0 - -  Feeling bad or failure about yourself  - 0 - -  Trouble concentrating - 0 - -  Moving slowly or fidgety/restless - 0 - -  Suicidal thoughts - 0 - -  PHQ-9 Score - 2 - -    GAD 7 : Generalized Anxiety Score 08/19/2015  Nervous, Anxious, on Edge 3  Control/stop worrying 3  Worry too much - different things 3  Trouble relaxing 3  Restless 0  Easily annoyed or irritable 3  Afraid - awful might happen 0  Total GAD 7 Score 15

## 2015-09-15 ENCOUNTER — Encounter: Payer: Self-pay | Admitting: Pharmacist

## 2015-09-15 ENCOUNTER — Ambulatory Visit: Payer: Self-pay | Attending: Family Medicine | Admitting: Pharmacist

## 2015-09-15 VITALS — BP 108/70 | HR 64

## 2015-09-15 DIAGNOSIS — E1122 Type 2 diabetes mellitus with diabetic chronic kidney disease: Secondary | ICD-10-CM

## 2015-09-15 DIAGNOSIS — N184 Chronic kidney disease, stage 4 (severe): Secondary | ICD-10-CM

## 2015-09-15 NOTE — Progress Notes (Signed)
S:    Patient arrives in good spirits with a family member.  Presents for diabetes evaluation, education, and management at the request of Dr. Jarold Song. Patient was referred on 09/08/15.  Patient was last seen by Primary Care Provider on 09/08/15.   Patient reports adherence with medications.  Current diabetes medications include: glipizide 5 mg BID  Patient denies hypoglycemic events.  Patient reported dietary habits: Eats 3 meals/day Breakfast: cereal Lunch: toast or sandwich Dinner: baked meats and vegetables. Drinks:water - patient would like to drink something besides water but doesn't know what is ok to drink.  Patient reported exercise habits: none   Patient reports nocturia.  Patient denies neuropathy. Patient denies visual changes. Patient reports self foot exams.    O:  Lab Results  Component Value Date   HGBA1C 7.6* 08/05/2015   Filed Vitals:   09/15/15 0923  BP: 108/70  Pulse: 64     10 year ASCVD risk: 8.7.  A/P: Diabetes currently uncontrolled based on A1c of 7.6. Patient denies hypoglycemic events and is able to verbalize appropriate hypoglycemia management plan. Patient reports adherence with medication. Control is suboptimal due to dietary indiscretion and sedentary lifestyle.  Provided comprehensive diabetes education including: what diabetes is, complications of diabetes, what the A1c is, goal A1c and blood glucose levels, diet and basic carbohydrate counting, importance of exercise, and basic blood glucose meter teaching. Patient did not have his meter with him so explained how to use it and patient will return if he is unable to use his at home. Patient and family member verbalized understanding of education.    Consider changing pravastatin to atorvastatin 40 mg or 80 mg due to ASCVD risk with diabetes.  Written patient instructions provided.  Total time in face to face counseling 20 minutes.   Follow up in Pharmacist Clinic Visit as needed. Next visit with  Dr. Jarold Song.

## 2015-09-15 NOTE — Patient Instructions (Signed)
Thank you for coming to see me!  Come back and see me if you need help with the blood sugar meter  Recuento bsico de carbohidratos para la diabetes mellitus (Basic Carbohydrate Counting for Diabetes Mellitus) El recuento de carbohidratos es un mtodo destinado a calcular la cantidad de carbohidratos en la dieta. El consumo de carbohidratos aumenta naturalmente el nivel de azcar (glucosa) en la sangre, por lo que es importante que sepa la cantidad que debe incluir en cada comida. El recuento de carbohidratos ayuda a Advertising account executive de glucosa en la sangre dentro de los lmites normales. La cantidad permitida de carbohidratos es diferente para cada persona. Un nutricionista puede ayudarlo a calcular la cantidad adecuada para usted. Una vez que sepa la cantidad de carbohidratos que puede consumir, podr calcular los carbohidratos de los alimentos que desea comer. Los siguientes alimentos incluyen carbohidratos:  Granos, como panes y cereales.  Frijoles secos y productos con soja.  Vegetales almidonados, como papas, guisantes y maz.  Lambert Mody y jugos de frutas.  Leche y Estate agent.  Dulces y bocadillos, como pastel, galletas, caramelos, papas fritas de bolsa, refrescos y bebidas frutales con azcar. RECUENTO DE CARBOHIDRATOS [employer] de calcular los carbohidratos de los alimentos. Puede usar cualquiera de los dos mtodos o [place] combinacin de [place]. Leer la etiqueta de informacin nutricional de los alimentos envasados La informacin nutricional es una etiqueta incluida en casi todas las bebidas y los alimentos envasados de los [place]. Indica el tamao de la porcin de ese alimento o bebida e informacin sobre los nutrientes de cada porcin, incluso los gramos (g) de carbohidratos por porcin.  Decida la cantidad de porciones que comer o tomar de este alimento o bebida. Multiplique la cantidad de porciones por el nmero de gramos de carbohidratos indicados en la etiqueta para esa  porcin. El total ser la cantidad de carbohidratos que consumir al comer ese alimento o tomar esa bebida. Conocer las porciones estndar de los alimentos Cuando coma alimentos no envasados o que no incluyan la informacin nutricional en la etiqueta, deber medir las porciones para poder calcular la cantidad de carbohidratos. Una porcin de la mayora de los alimentos ricos en carbohidratos contiene alrededor de 15g de carbohidratos. La siguiente [employer] tamaos de porcin de los alimentos ricos en carbohidratos que contienen alrededor de 15g de carbohidratos por porcin:   1rebanada de pan (1oz) o 1tortilla de seis pulgadas.  panecillo de hamburguesa o bollito tipo ingls.  4a 6galletas.   de taza de cereal sin azcar y seco.   taza de cereal caliente.   de taza de arroz o pastas.  taza de pur de papas o de una papa grande al horno.  1taza de frutas frescas o una fruta pequea.  taza de frutas o jugo de frutas enlatados o congelados.  [place].   de taza de yogur descremado sin ningn agregado o de yogur endulzado con edulcorante artificial.  taza de vegetales almidonados, como guisantes, maz o papas, o de frijoles secos cocidos. Decida la cantidad de porciones Gaffer. Multiplique la cantidad de porciones por 15 (los gramos de carbohidratos en esa porcin). Por ejemplo, si come 2tazas de fresas, habr comido 2porciones y 30g de carbohidratos (2porciones x 15g = 30g). [place] y guisos, en las que se mezcla ms de un alimento, deber [employer] carbohidratos de cada alimento incluido. EJEMPLO DE RECUENTO DE CARBOHIDRATOS Ejemplo de cena  3 onzas de pechugas de  pollo.   de taza de arroz integral.   taza de [place].  1 taza de fresas con crema batida sin azcar. Clculo de [place] 1: Identifique los alimentos que contienen carbohidratos:    Arroz.  Maz.  Leche.  Hughie Closs. [place] 2: Calcule el nmero de porciones que consumir de cada uno:   2 porciones de Occupational psychologist.  1 porcin de maz.  [place].  1 porcin de fresas. [place] 3: Multiplique cada una de esas porciones por 15g:   2 porciones de arroz x 15 g = 30 g.  1 porcin de maz x 15 g = 15 g.  1 porcin de leche x 15 g = 15 g.  1 porcin de fresas x 15 g = 15 g. [place] 4: Sume todas las cantidades para Armed forces logistics/support/administrative officer total de gramos de carbohidratos consumidos: 30 g + 15 g + 15 g + 15 g = 75 g.   Esta informacin no tiene Marine scientist el consejo del mdico. Asegrese de hacerle al mdico cualquier pregunta que tenga.   Document Released: 09/27/2011 Document Revised: 07/26/2014 Elsevier Interactive Patient Education 2016 [place] nivel de glucosa en la sangre - Adultos (Blood Glucose Monitoring, Adult) El control de la glucosa en la sangre (tambin llamada azcar en la sangre) lo ayudar a tener la diabetes bajo control. Tambin ayuda a que usted y Chief Financial Officer la diabetes y determinen si el tratamiento es Armed forces logistics/support/administrative officer. POR QU HAY QUE CONTROLAR LA GLUCOSA EN LA SANGRE?  Esto puede ayudar a comprender de [employer], la actividad fsica y los medicamentos inciden en los niveles de [place].  Le permite conocer el nivel de glucosa en la sangre en cualquier momento dado. Puede saber rpidamente si el nivel es bajo (hipoglucemia) o alto (hiperglucemia).  Puede ser de ayuda para que usted y el mdico sepan cmo Water engineer,  y para entender cmo controlar una enfermedad o ajustar los medicamentos para hacer ejercicio. CUNDO DEBE HACERSE LAS PRUEBAS? El mdico lo ayudar a decidir con qu frecuencia deber [employer] niveles de glucosa en la [place]. Esto puede depender del tipo de diabetes que tenga, su control de la diabetes o los tipos de medicamentos que tome. Asegrese de anotar todos los valores de  la glucosa en la [place], de modo que esta informacin pueda ser revisada por su mdico. A continuacin puede ver ejemplos de los momentos para Optometrist la prueba que el mdico puede Event organiser. Diabetes tipo1  Mdaselo al menos 2 veces al da si la diabetes est bien controlada, si [place] una bomba de insulina o si se aplica muchas inyecciones diarias.  Si la diabetes no est bien controlada o si est enfermo, puede ser necesario que se controle con ms frecuencia.  Es recomendable que tambin lo mida en estas oportunidades:  Antes de cada inyeccin de insulina.  Antes y despus de hacer ejercicio.  [place] comidas y 2horas despus de Scientist, research (physical sciences).  Ocasionalmente, entre las 2:00a.m. y las 3:00a.m. Diabetes tipo2  Si est utilizando insulina, realice la medicin al menos 2 veces al [employer]. Sin embargo, es Multimedia programmer medicin antes de cada inyeccin de [place].  Si toma medicamentos por boca (va oral), hgase la prueba 2veces por da.  Si sigue una dieta controlada, hgase la prueba una vez por da.  Si la diabetes no est bien controlada o si est enfermo, puede ser necesario que se controle con  ms frecuencia. CMO CONTROLAR EL NIVEL DE GLUCOSA EN LA SANGRE Insumos necesarios  Medidor de glucosa en la sangre.  Tiras reactivas para el medidor. Cada medidor tiene sus propias tiras reactivas. [place] Nizhoni tiras reactivas correspondientes a su medidor.  Una aguja para pinchar (lanceta).  Un dispositivo que sujeta la lanceta (dispositivo de puncin).  Un diario o libro de anotaciones para [employer]. Procedimiento  Lave sus manos con agua y [place]. No se recomienda usar alcohol.  Pnchese el costado del dedo (no la punta) con Retail buyer.  Apriete suavemente el dedo hasta que aparezca una pequea gota de [place].  Siga las instrucciones que vienen con el medidor para Garment/textile technologist tira Comptroller, Midwife la sangre sobre la tira y usar el medidor de Education administrator. Otras zonas de las que se puede tomar sangre para la prueba Algunos medidores le permiten tomar sangre para la prueba de otras zonas del cuerpo (que no son el dedo). Estas reas se llaman sitios alternativos. Los sitios alternativos ms comunes son los siguientes:  El Management consultant.  El muslo.  La zona posterior de la parte inferior de la pierna.  La palma de la mano. El flujo de sangre en estas zonas es ms lento. Por lo tanto, los valores de glucosa en la sangre que obtenga pueden estar demorados, y los nmeros son diferentes de los que obtiene de los dedos. No saque sangre de sitios alternativos si cree que tiene hipoglucemia. Los valores no sern precisos. Siempre extraiga del dedo si tiene hipoglucemia. Adems, si no puede darse cuenta cuando tiene bajos los niveles (hipoglucemia asintomtica), siempre extraiga sangre de los dedos para los controles de glucosa en la [place]. CONSEJOS ADICIONALES PARA EL CONTROL DE LA GLUCOSA  No vuelva a [place] lancetas.  Siempre tenga los insumos a mano.  Todos los medidores de glucosa incluyen un nmero de telfono "directo", disponible las 24 horas, al que podr llamar si tiene preguntas o [place].  Ajuste (calibre) el medidor de glucosa con una solucin de control despus de terminar algunas cajas de tiras reactivas. LLEVE REGISTROS DE LOS NIVELES DE GLUCOSA EN LA SANGRE Es recomendable llevar un diario o un registro de los valores de glucosa en la [place]. La [employer] de los medidores de glucosa, sino todos, conservan el registro de la glucosa en el dispositivo. Algunos medidores permiten descargar los registros a su computadora. Llevar un registro de los valores de glucosa en la sangre es especialmente til si desea observar los patrones. Haga anotaciones simultneas con la Teacher, English as a foreign language de los valores de glucosa en la sangre debido a que podra olvidar lo que ocurri en el momento exacto. Llevar un buen registro los ayudar a usted y al  mdico a Fish farm manager juntos para Scientist, forensic un buen control de la diabetes.    Esta informacin no tiene Marine scientist el consejo del mdico. Asegrese de hacerle al mdico cualquier pregunta que tenga.   Document Released: 07/05/2005 Document Revised: 07/26/2014 Elsevier Interactive Patient Education [employer].  Diabetes y actividad fsica (Diabetes and Exercise) Hacer actividad fsica con regularidad es muy importante. No se trata solo de [employer]. Tiene muchos otros beneficios, como por ejemplo:  Mejorar el estado fsico, la flexibilidad y la resistencia.  Aumenta la densidad sea.  Ayuda a Technical sales engineer.  Disminuye la Air traffic controller.  Aumenta la fuerza muscular.  Reduce el estrs y las tensiones.  Mejora el estado de salud general. Las personas diabticas que realizan  actividad fsica tienen beneficios adicionales debido al ejercicio:  Reduce el apetito.  El organismo mejora el uso del azcar (glucosa) de la [place].  Ayuda a disminuir o Product/process development scientist.  Disminuye la presin arterial.  Ayuda a disminuir los lpidos en la sangre (colesterol y triglicridos).  El organismo mejora el uso de la insulina porque:  Aumenta la sensibilidad del organismo a la insulina.  Reduce las necesidades de insulina del organismo.  Disminuye el riesgo de enfermedad cardaca por la actividad fsica ya que  disminuye el colesterol y [employer] triglicridos.  Aumenta los niveles de colesterol bueno (como las lipoprotenas de alta densidad [HDL]) en el organismo.  Disminuye los niveles de glucosa en la [place]. SU PLAN DE ACTIVIDAD  Elija una actividad que disfrute y establezca objetivos realistas. Para ejercitarse sin riesgos, debe comenzar a Psychologist, prison and probation services cualquier actividad fsica nueva lentamente y aumentar la intensidad del ejercicio de forma gradual con el tiempo. Su mdico o educador en diabetes podrn ayudarlo a crear un plan de actividades que lo  beneficie. Las recomendaciones generales incluyen lo siguiente:  Psychologist, clinical a los nios para que realicen al menos 60 minutos de actividad fsica Armed forces operational officer.  Estirarse y Optometrist ejercicios de entrenamiento de la fuerza, como yoga o levantamiento de pesas, por lo menos 2 veces por semana.  Realizar en total por lo menos 150 minutos de ejercicios de intensidad moderada cada semana, como caminar a paso ligero o hacer gimnasia acutica.  Hacer ejercicio fsico por lo menos 3 das por semana y no dejar pasar ms de 2 das seguidos sin ejercitarse.  Evitar los perodos largos de inactividad (90 minutos o ms tiempo). Cuando deba pasar mucho tiempo sentado, haga pausas frecuentes para caminar o estirarse. RECOMENDACIONES PARA REALIZAR EJERCICIOS CUANDO SE TIENE DIABETES TIPO 1 O TIPO 2   Controle la glucosa en la sangre antes de comenzar. Si el nivel de glucosa en la sangre es de ms de 240 mg/dl, controle las cetonas en la [place]. No haga actividad fsica si hay cetonas.  Evite inyectarse insulina en las zonas del cuerpo que ejercitar. Por ejemplo, evite inyectarse insulina en:  Los brazos, si juega al tenis.  Las piernas, si corre.  Lleve un registro de:  Los alimentos que consume antes y despus de TEFL teacher.  Los momentos esperables de picos de accin de la insulina.  Los niveles de glucosa en la sangre antes y despus de hacer ejercicios.  El tipo y cantidad de [place] fsica que Musician.  Revise los registros con su mdico. El mdico lo ayudar a Actor pautas para ajustar la cantidad de alimento y las cantidades de insulina antes y despus de Field seismologist ejercicios.  Si toma insulina o agentes hipoglucemiantes por va oral, observe si hay signos y sntomas de hipoglucemia. Entre los que se incluyen:  Mareos.  Temblores.  Sudoracin.  Escalofros.  Confusin.  Beba gran cantidad de agua mientras hace ejercicios para evitar la deshidratacin o los golpes de Freight forwarder.  Durante la actividad fsica se pierde agua corporal que se debe reponer.  Comente con su mdico antes de comenzar un programa de actividad fsica para verificar que sea seguro para usted. Recuerde, cualquier actividad es mejor que ninguna.   Esta informacin no tiene Marine scientist el consejo del mdico. Asegrese de hacerle al mdico cualquier pregunta que tenga.   Document Released: 07/25/2007 Document Revised: 11/19/2014 Elsevier Interactive Patient Education [employer].

## 2015-09-26 MED FILL — glipiZIDE 5 MG TABS: 5 | 30 days supply | Qty: 60 | Fill #1

## 2015-10-01 ENCOUNTER — Encounter: Payer: Self-pay | Admitting: Family Medicine

## 2015-10-01 ENCOUNTER — Ambulatory Visit (INDEPENDENT_AMBULATORY_CARE_PROVIDER_SITE_OTHER): Payer: Self-pay | Admitting: Pulmonary Disease

## 2015-10-01 ENCOUNTER — Encounter: Payer: Self-pay | Admitting: Pulmonary Disease

## 2015-10-01 ENCOUNTER — Ambulatory Visit: Payer: Self-pay | Attending: Family Medicine | Admitting: Family Medicine

## 2015-10-01 VITALS — BP 145/72 | HR 62 | Temp 97.7°F | Resp 15 | Ht 65.5 in | Wt 200.8 lb

## 2015-10-01 VITALS — BP 142/88 | HR 64 | Ht 65.5 in | Wt 201.2 lb

## 2015-10-01 DIAGNOSIS — E669 Obesity, unspecified: Secondary | ICD-10-CM | POA: Insufficient documentation

## 2015-10-01 DIAGNOSIS — Z79899 Other long term (current) drug therapy: Secondary | ICD-10-CM | POA: Insufficient documentation

## 2015-10-01 DIAGNOSIS — Z992 Dependence on renal dialysis: Secondary | ICD-10-CM | POA: Insufficient documentation

## 2015-10-01 DIAGNOSIS — N184 Chronic kidney disease, stage 4 (severe): Secondary | ICD-10-CM | POA: Insufficient documentation

## 2015-10-01 DIAGNOSIS — N179 Acute kidney failure, unspecified: Secondary | ICD-10-CM | POA: Insufficient documentation

## 2015-10-01 DIAGNOSIS — J4520 Mild intermittent asthma, uncomplicated: Secondary | ICD-10-CM

## 2015-10-01 DIAGNOSIS — R600 Localized edema: Secondary | ICD-10-CM

## 2015-10-01 DIAGNOSIS — R0602 Shortness of breath: Secondary | ICD-10-CM | POA: Insufficient documentation

## 2015-10-01 DIAGNOSIS — I10 Essential (primary) hypertension: Secondary | ICD-10-CM

## 2015-10-01 DIAGNOSIS — R05 Cough: Secondary | ICD-10-CM

## 2015-10-01 DIAGNOSIS — G4710 Hypersomnia, unspecified: Secondary | ICD-10-CM | POA: Insufficient documentation

## 2015-10-01 DIAGNOSIS — E11319 Type 2 diabetes mellitus with unspecified diabetic retinopathy without macular edema: Secondary | ICD-10-CM | POA: Insufficient documentation

## 2015-10-01 DIAGNOSIS — E118 Type 2 diabetes mellitus with unspecified complications: Secondary | ICD-10-CM

## 2015-10-01 DIAGNOSIS — R0609 Other forms of dyspnea: Secondary | ICD-10-CM

## 2015-10-01 DIAGNOSIS — E7800 Pure hypercholesterolemia, unspecified: Secondary | ICD-10-CM | POA: Insufficient documentation

## 2015-10-01 DIAGNOSIS — R609 Edema, unspecified: Secondary | ICD-10-CM | POA: Insufficient documentation

## 2015-10-01 DIAGNOSIS — R051 Acute cough: Secondary | ICD-10-CM | POA: Insufficient documentation

## 2015-10-01 DIAGNOSIS — Z9889 Other specified postprocedural states: Secondary | ICD-10-CM | POA: Insufficient documentation

## 2015-10-01 DIAGNOSIS — R059 Cough, unspecified: Secondary | ICD-10-CM | POA: Insufficient documentation

## 2015-10-01 DIAGNOSIS — J45909 Unspecified asthma, uncomplicated: Secondary | ICD-10-CM | POA: Insufficient documentation

## 2015-10-01 DIAGNOSIS — I129 Hypertensive chronic kidney disease with stage 1 through stage 4 chronic kidney disease, or unspecified chronic kidney disease: Secondary | ICD-10-CM | POA: Insufficient documentation

## 2015-10-01 DIAGNOSIS — E1122 Type 2 diabetes mellitus with diabetic chronic kidney disease: Secondary | ICD-10-CM | POA: Insufficient documentation

## 2015-10-01 LAB — COMPLETE METABOLIC PANEL WITH GFR
ALBUMIN: 3.3 g/dL — AB (ref 3.6–5.1)
ALK PHOS: 61 U/L (ref 40–115)
ALT: 10 U/L (ref 9–46)
AST: 10 U/L (ref 10–35)
BUN: 44 mg/dL — AB (ref 7–25)
CALCIUM: 8.4 mg/dL — AB (ref 8.6–10.3)
CO2: 22 mmol/L (ref 20–31)
CREATININE: 3.41 mg/dL — AB (ref 0.70–1.33)
Chloride: 108 mmol/L (ref 98–110)
GFR, EST AFRICAN AMERICAN: 23 mL/min — AB (ref >=60)
GFR, Est Non African American: 20 mL/min — ABNORMAL LOW (ref >=60)
Glucose, Bld: 125 mg/dL — ABNORMAL HIGH (ref 65–99)
POTASSIUM: 5 mmol/L (ref 3.5–5.3)
Sodium: 140 mmol/L (ref 135–146)
Total Bilirubin: 0.3 mg/dL (ref 0.2–1.2)
Total Protein: 6.5 g/dL (ref 6.1–8.1)

## 2015-10-01 LAB — GLUCOSE, POCT (MANUAL RESULT ENTRY): POC Glucose: 137 mg/dl — AB (ref 70–99)

## 2015-10-01 MED ORDER — BUDESONIDE-FORMOTEROL FUMARATE 80-4.5 MCG/ACT IN AERO: 2.0000 | INHALATION_SPRAY | Freq: Two times a day (BID) | RESPIRATORY_TRACT | Status: DC

## 2015-10-01 MED ORDER — FUROSEMIDE 80 MG PO TABS
80.0000 mg | ORAL_TABLET | Freq: Two times a day (BID) | ORAL | Status: DC
Start: 2015-10-01 — End: 2015-12-01

## 2015-10-01 MED ORDER — ALBUTEROL SULFATE HFA 108 (90 BASE) MCG/ACT IN AERS: 2.0000 | INHALATION_SPRAY | Freq: Four times a day (QID) | RESPIRATORY_TRACT | Status: DC | PRN

## 2015-10-01 NOTE — Patient Instructions (Addendum)
1. Start Symbicort, 80/4.5, 2 puffs twice a day. We will give you a sample. 2. Start albuterol, 2 puffs every 4 hours as needed for shortness of breath. 3. Start Flonase 2 squirts per nostril, at bedtime. 4. Please call the office once you have Medicaid so we can order sleep study and PFTs.   Return to clinic after 2 mos.    Instrucciones 1. Inicie Symbicort, 80 / 4.5, 2 puffs [place]. Le daremos Truddie Coco. 2. Inicie albuterol, 2 inhalaciones cada 4 horas segn sea necesario para la falta de aliento. 3. Inicie Flonase 2 squirts por [place], a la hora de [place]. 4. Llame por favor a la oficina una vez que usted tenga Medicaid as que podemos ordenar estudio del sueo y PFTs. Regreso a la clnica despus de 2 meses.

## 2015-10-01 NOTE — Assessment & Plan Note (Signed)
Patient admitted in February/2017 for acute on chronic dyspnea. Chest CT scan with infiltrate over the left upper lobe. Did not present  as pneumonia. Clinically better with diuretics. Will need repeat chest CT scan once he has Medicaid.

## 2015-10-01 NOTE — Progress Notes (Signed)
Subjective:    Patient ID: Eric Glenn, male    DOB: Dec 09, 1965, 50 y.o.   MRN: [id number]  HPI  He is a 50 year old man with a history of type 2 diabetes mellitus (A1c 7.6), hypertension, stage IV chronic kidney disease, hyperlipidemia recently hospitalized  for dyspnea and acute on chronic renal failure.  He was placed on Lasix during that hospitalization and recently had a right AV fistula placed.  He complains of shortness of breath, cough for the last 2 weeks, shortness of breath, orthopnea. He admits to running out of Lasix for the last few days and has had pedal edema. He checks his blood sugars and states sugars have been running in the 120 to 130 range fasting. He has been compliant with his statin for hyperlipidemia. Due to lack of medical coverage she has been unable to obtain a sleep study even though he has symptoms suggestive of sleep apnea.  Seen by Pulmonary today and commenced on Symbicort and Proventil which he is yet to pick up.   Past Medical History  Diagnosis Date  . Diabetes mellitus without complication ([place])   . Hypertension   . High cholesterol   . Chronic kidney disease (CKD), stage IV (severe) ([place])   . Diabetic retinopathy ([place])   . Anemia     Past Surgical History  Procedure Laterality Date  . Umbilical hernia repair    . Av fistula placement Right 09/04/2015    Procedure: ARTERIOVENOUS (AV) FISTULA CREATION;  Surgeon: Angelia Mould, MD;  [facility];  Service: Vascular;  Laterality: Right;    Social History   Social History  . Marital Status: Single    Spouse Name: N/A  . Number of Children: N/A  . Years of Education: N/A   Occupational History  . Not on file.   Social History Main Topics  . Smoking status: Never Smoker   . Smokeless tobacco: Never Used  . Alcohol Use: No  . Drug Use: No  . Sexual Activity: Not on file   Other Topics Concern  . Not on file   Social History Narrative    Current Outpatient  Prescriptions on File Prior to Visit  Medication Sig Dispense Refill  . albuterol (PROVENTIL HFA;VENTOLIN HFA) 108 (90 Base) MCG/ACT inhaler Inhale 2 puffs into the lungs every 6 (six) hours as needed for wheezing or shortness of breath. 1 Inhaler 6  . amLODipine (NORVASC) 10 MG tablet Take 1 tablet (10 mg total) by mouth daily. 30 tablet 2  . budesonide-formoterol (SYMBICORT) 80-4.5 MCG/ACT inhaler Inhale 2 puffs into the lungs 2 (two) times daily. 1 Inhaler 0  . carvedilol (COREG) 25 MG tablet Take 1 tablet (25 mg total) by mouth 2 (two) times daily with a meal. 60 tablet 2  . glipiZIDE (GLUCOTROL) 5 MG tablet Take 1 tablet (5 mg total) by mouth 2 (two) times daily before a meal. 60 tablet 2  . lactulose (CHRONULAC) 10 GM/15ML solution Take 15 mLs (10 g total) by mouth 2 (two) times daily as needed for mild constipation. 946 mL 2  . loratadine (CLARITIN) 10 MG tablet Take 1 tablet (10 mg total) by mouth daily. 10 tablet 0  . omega-3 acid ethyl esters (LOVAZA) 1 g capsule Take 1 capsule (1 g total) by mouth 2 (two) times daily. 60 capsule 1  . pravastatin (PRAVACHOL) 40 MG tablet Take 1 tablet (40 mg total) by mouth daily at 6 PM. 30 tablet 2  . Blood Glucose Monitoring Suppl (  TRUE METRIX METER) DEVI 1 each by Does not apply route 3 (three) times daily before meals. (Patient not taking: Reported on 10/01/2015) 1 Device 0  . glucose blood (TRUE METRIX BLOOD GLUCOSE TEST) test strip Use three times daily before meals (Patient not taking: Reported on 10/01/2015) 100 each 12  . OVER THE COUNTER MEDICATION Place 1 spray into both nostrils daily as needed (congestion). Reported on 10/01/2015    . TRUEPLUS LANCETS 28G MISC 1 each by Does not apply route 3 (three) times daily before meals. (Patient not taking: Reported on 10/01/2015) 100 each 12   No current facility-administered medications on file prior to visit.      Review of Systems  Constitutional: Negative for activity change and appetite change.    HENT: Negative for sinus pressure and sore throat.   Eyes: Negative for visual disturbance.  Respiratory: Positive for cough and shortness of breath. Negative for chest tightness.   Cardiovascular: Positive for leg swelling. Negative for chest pain.  Gastrointestinal: Negative for abdominal pain, diarrhea, constipation and abdominal distention.  Endocrine: Negative.   Genitourinary: Negative for dysuria.  Musculoskeletal: Negative for myalgias and joint swelling.  Skin: Negative for rash.  Allergic/Immunologic: Negative.   Neurological: Negative for weakness, light-headedness and numbness.  Psychiatric/Behavioral: Negative for suicidal ideas and dysphoric mood.       Objective: Filed Vitals:   10/01/15 1054  BP: 145/72  Pulse: 62  Temp: 97.7 F (36.5 C)  Resp: 15  Height: 5' 5.5" (1.664 m)  Weight: 200 lb 12.8 oz (91.082 kg)  SpO2: 95%      Physical Exam  Constitutional: He is oriented to person, place, and time. He appears well-developed and well-nourished.  Cardiovascular: Normal rate, normal heart sounds and intact distal pulses.   No murmur heard. Pulmonary/Chest: Effort normal. He has no wheezes. He has rales. He exhibits no tenderness.  Abdominal: Soft. Bowel sounds are normal. He exhibits no distension and no mass. There is no tenderness.  Musculoskeletal: Normal range of motion. He exhibits edema (bilateral 1+ nonpitting ankle edema.).  Neurological: He is alert and oriented to person, place, and time.          Assessment & Plan:  1. Type 2 diabetes mellitus with stage 4 chronic kidney disease, without long-term current use of insulin (HCC) A1c 7.6 Glycemic control will be less strict given history of CKD Continue current medications - Glucose (CBG)  2. Chronic kidney disease, stage IV (severe) (HCC) Status post AV fistula placement Renal function has been worsening especially with the patient being on Lasix Has an upcoming appointment with [place]  [facility]  3. Pedal edema  2-D echo from 07/2015 revealed EF of 50-55% Refilled Lasix which he has been out of given worsening pedal edema and dyspnea and reveals on exam. Will need further workup or cardiology referral in the near future  4. Hypertension Blood pressure is slightly elevated above goal of less than 140/90 Advised on low-sodium diet, DASH diet and compliance with antihypertensives  5. Suspected OSA  He will benefit from a CPAP machine but we will need a sleep study to make a diagnosis Seen by pulmonary and placed on Symbicort and Proventil. - Split night study; Future  Will need repeat CT chest in a few months to follow-up on pulmonary infiltrates - will defer to pulmonary to manage this  Financial counselors called in to see the patient given he has no medical coverage and he seems to be Medicaid pending; we will need  this for [place] discounts to facilitate his ability to see specialist and have diagnostic testing.

## 2015-10-01 NOTE — Progress Notes (Signed)
Subjective:    Patient ID: Eric Glenn, male    DOB: 03/27/1966, 50 y.o.   MRN: [id number]  HPI   This is the case of Eric Glenn, 50 y.o. Male, who was referred by Dr. Arnoldo Morale in consultation regarding possible OSA.   I was able to communicate to the patient through his niece, Eric Glenn.   As you very well know, patient Is a nonsmoker. Denies any history of asthma or COPD. He has chronic kidney disease, on the verge of being dialyzed. He had an AV fistula placed in the right upper extremity in preparation for dialysis. His diabetes is difficult to control. He has worsening renal function and I issues. He is waiting for Medicaid. Hopefully we'll get Medicaid in a month.  Has snoring, witnessed apneas, gasping, choking. He wakes up frequently during the night. Ends up sleeping 3-4 hours per night. Daily naps. He wakes up unrefreshed in the morning. He sleep talks. Denies any other abnormal behavior and sleep.  He was admitted from February 9  ~12 of 2017 for acute dyspnea. He improved with diuretics. Chest CT scan done showed  infiltrate in the left upper lobe.  He has exertional dyspnea with ADLs related to fluid overload. The same the last month.       Review of Systems  Constitutional: Positive for chills. Negative for unexpected weight change.  HENT: Positive for congestion, rhinorrhea and sore throat. Negative for dental problem, ear pain, nosebleeds, postnasal drip, sinus pressure, sneezing and trouble swallowing.   Eyes: Positive for visual disturbance. Negative for redness and itching.  Respiratory: Positive for cough, chest tightness, shortness of breath and wheezing.   Cardiovascular: Positive for leg swelling. Negative for palpitations.  Gastrointestinal: Negative.  Negative for nausea and vomiting.  Endocrine: Negative.   Genitourinary: Negative.  Negative for dysuria.  Musculoskeletal: Positive for arthralgias. Negative for joint swelling.    Skin: Negative.  Negative for rash.  Allergic/Immunologic: Positive for food allergies.  Neurological: Negative.  Negative for headaches.  Hematological: Bruises/bleeds easily.  Psychiatric/Behavioral: Negative.  Negative for dysphoric mood. The patient is not nervous/anxious.    Past Medical History  Diagnosis Date  . Diabetes mellitus without complication ([place])   . Hypertension   . High cholesterol   . Chronic kidney disease (CKD), stage IV (severe) ([place])   . Diabetic retinopathy ([place])   . Anemia    (-) CA, DVT  Family History  Problem Relation Age of Onset  . Hypertension Mother   . Hyperlipidemia Mother   . Diabetes Mellitus II Sister      Past Surgical History  Procedure Laterality Date  . Umbilical hernia repair    . Av fistula placement Right 09/04/2015    Procedure: ARTERIOVENOUS (AV) FISTULA CREATION;  Surgeon: Angelia Mould, MD;  Location: [facility];  Service: Vascular;  Laterality: Right;    Social History   Social History  . Marital Status: Single    Spouse Name: N/A  . Number of Children: N/A  . Years of Education: N/A   Occupational History  . Not on file.   Social History Main Topics  . Smoking status: Never Smoker   . Smokeless tobacco: Never Used  . Alcohol Use: No  . Drug Use: No  . Sexual Activity: Not on file   Other Topics Concern  . Not on file   Social History Narrative   Single, no children. Did Architect work. He was a heavy drinker until 8 months ago. Lives  with his sister and her family in [place]. From [place] originally -- in [name] x 11 yrs.   Allergies  Allergen Reactions  . Pork-Derived Products Rash and Other (See Comments)    Raises blood pressure     Outpatient Prescriptions Prior to Visit  Medication Sig Dispense Refill  . amLODipine (NORVASC) 10 MG tablet Take 1 tablet (10 mg total) by mouth daily. 30 tablet 2  . Blood Glucose Monitoring Suppl (TRUE METRIX METER) DEVI 1 each by Does not apply route 3 (three)  times daily before meals. (Patient not taking: Reported on 10/01/2015) 1 Device 0  . carvedilol (COREG) 25 MG tablet Take 1 tablet (25 mg total) by mouth 2 (two) times daily with a meal. 60 tablet 2  . glipiZIDE (GLUCOTROL) 5 MG tablet Take 1 tablet (5 mg total) by mouth 2 (two) times daily before a meal. 60 tablet 2  . glucose blood (TRUE METRIX BLOOD GLUCOSE TEST) test strip Use three times daily before meals (Patient not taking: Reported on 10/01/2015) 100 each 12  . lactulose (CHRONULAC) 10 GM/15ML solution Take 15 mLs (10 g total) by mouth 2 (two) times daily as needed for mild constipation. 946 mL 2  . loratadine (CLARITIN) 10 MG tablet Take 1 tablet (10 mg total) by mouth daily. 10 tablet 0  . omega-3 acid ethyl esters (LOVAZA) 1 g capsule Take 1 capsule (1 g total) by mouth 2 (two) times daily. 60 capsule 1  . OVER THE COUNTER MEDICATION Place 1 spray into both nostrils daily as needed (congestion). Reported on 10/01/2015    . pravastatin (PRAVACHOL) 40 MG tablet Take 1 tablet (40 mg total) by mouth daily at 6 PM. 30 tablet 2  . TRUEPLUS LANCETS 28G MISC 1 each by Does not apply route 3 (three) times daily before meals. (Patient not taking: Reported on 10/01/2015) 100 each 12  . furosemide (LASIX) 80 MG tablet Take 1 tablet (80 mg total) by mouth 2 (two) times daily. 30 tablet 0   No facility-administered medications prior to visit.   Meds ordered this encounter  Medications  . budesonide-formoterol (SYMBICORT) 80-4.5 MCG/ACT inhaler    Sig: Inhale 2 puffs into the lungs 2 (two) times daily.    Dispense:  1 Inhaler    Refill:  0  . albuterol (PROVENTIL HFA;VENTOLIN HFA) 108 (90 Base) MCG/ACT inhaler    Sig: Inhale 2 puffs into the lungs every 6 (six) hours as needed for wheezing or shortness of breath.    Dispense:  1 Inhaler    Refill:  6    PLEASE DISPENSE CHEAPEST GENERIC AS PT WILL BE PAYING OOP          Objective:   Physical Exam  Vitals:  Filed Vitals:   10/01/15 0936    BP: 142/88  Pulse: 64  Height: 5' 5.5" (1.664 m)  Weight: 201 lb 3.2 oz (91.264 kg)  SpO2: 93%    Constitutional/General:  Pleasant, well-nourished, well-developed, not in any distress,  Comfortably seating.  Well kempt  Body mass index is 32.96 kg/(m^2). Wt Readings from Last 3 Encounters:  10/01/15 200 lb 12.8 oz (91.082 kg)  10/01/15 201 lb 3.2 oz (91.264 kg)  09/08/15 188 lb 1.6 oz (85.322 kg)    Neck circumference:  16 in  HEENT: Pupils equal and reactive to light and accommodation. Anicteric sclerae. Normal nasal mucosa.   No oral  lesions,  mouth clear,  oropharynx clear, no postnasal drip. (-) Oral thrush. No dental caries.  Airway - Mallampati class III  Neck: No masses. Midline trachea. No JVD, (-) LAD. (-) bruits appreciated.  Respiratory/Chest: Grossly normal chest. (-) deformity. (-) Accessory muscle use.  Symmetric expansion. (-) Tenderness on palpation.  Resonant on percussion.  Diminished BS on both lower lung zones. (-) wheezing, crackles, rhonchi (-) egophony  Cardiovascular: Regular rate and  rhythm, heart sounds normal, no murmur or gallops, no peripheral edema  Gastrointestinal:  Normal bowel sounds. Soft, non-tender. No hepatosplenomegaly.  (-) masses.   Musculoskeletal:  Normal muscle tone. Normal gait.   Extremities: Grossly normal. (-) clubbing, cyanosis.  (-) edema  Skin: (-) rash,lesions seen.   Neurological/Psychiatric : alert, oriented to time, place, person. Normal mood and affect          Assessment & Plan:  Hypersomnia  Has snoring, witnessed apneas, gasping, choking. He wakes up frequently during the night. Ends up sleeping 3-4 hours per night. Daily naps. He wakes up unrefreshed in the morning. He sleep talks. Denies any other abnormal behavior and sleep.  Patient has chronic kidney disease, most likely will end up being hemodialysis dependent. Patient has worsening eye vision related to uncontrolled diabetes. Patient  also likely has asthma with recent dyspnea related to asthma and fluid overload.  ESS 12. Neck circumference is 16 inches.   1. We discussed about the diagnosis of Obstructive Sleep Apnea (OSA) and implications of untreated OSA. We discussed about CPAP and BiPaP as possible treatment options.   2. We will schedule the patient for a home sleep study once he gets his Medicaid. I instructed his niece, Eric Glenn, to give [name] a call once he gets Medicaid. Hopefully that's within the next month. Plan to get a home sleep study since it can be done sooner. 3. If with sleep apnea, we can try him on an auto CPAP, 5-15 cm water. 4. He will need a 1 month download. If with no good correction, likely will need an in lab study. 5. Patient was instructed to call the office if he/she has not heard back from the office 1-2 weeks after the sleep study.  5. Patient was instructed to call the office if he/she is having issues with the PAP device.  6. We discussed good sleep hygiene.   7. Patient was advised not to engage in activities requiring concentration and/or vigilance if he/she is sleepy.  Patient was advised not to drive if he/she is sleepy.   8. Complicated case as patient has significant comorbidities namely: CKD which will eventually need dialysis, pulmonary edema, possible asthma is/COPD, worsening vision related to diabetes.     Asthma Recent exertional dyspnea, cough, congestion with changes in weather. Originally from [place]. Not a big smoker. Other than pulmonary edema, the reason dyspnea might be related to asthma or hyperreactive airway disease. Plan: 1. Try Symbicort, 80/4.5, 2 puffs twice a day. We'll give him samples to last a month. 2. Start albuterol, 2 puffs every 4 hours as needed. 3. We'll need PFTs, ABG once he has Medicaid. 4. Chest CT scan in February/2017 showed possible infiltrate in left upper lung zone. Plan to repeat chest CT scan once he has Medicaid.  Obesity Advised  on weight reduction.  Chronic kidney disease, stage IV (severe) ([place]) Patient with worsening renal function. Has CK D stage IV.  Status post AVF on the right upper extremity. Most likely will be HD dependent. Awaiting Medicaid. Recent dyspnea and pulmonary edema better on diuretics.  Pedal edema Patient admitted in February/2017 for  acute on chronic dyspnea. Chest CT scan with infiltrate over the left upper lobe. Did not present  as pneumonia. Clinically better with diuretics. Will need repeat chest CT scan once he has Medicaid.     Thank you very much for letting me participate in this patient's care. Please do not hesitate to give me a call if you have any questions or concerns regarding the treatment plan.   Patient will follow up with me in 2 mos.     Monica Becton, MD 3/15/20172:24 PM [facility] [place] Pager: [phone] Office: [phone], Fax: [phone]

## 2015-10-01 NOTE — Assessment & Plan Note (Signed)
Advised on weight reduction.

## 2015-10-01 NOTE — Assessment & Plan Note (Addendum)
Recent exertional dyspnea, cough, congestion with changes in weather. Originally from [place]. Not a big smoker. Other than pulmonary edema, the reason dyspnea might be related to asthma or hyperreactive airway disease. Plan: 1. Try Symbicort, 80/4.5, 2 puffs twice a day. We'll give him samples to last a month. 2. Start albuterol, 2 puffs every 4 hours as needed. 3. We'll need PFTs, ABG once he has Medicaid. 4. Chest CT scan in February/2017 showed possible infiltrate in left upper lung zone. Plan to repeat chest CT scan once he has Medicaid.

## 2015-10-01 NOTE — Progress Notes (Signed)
Took all medications this morning States he is not checking his sugars at home

## 2015-10-01 NOTE — Assessment & Plan Note (Signed)
  Has snoring, witnessed apneas, gasping, choking. He wakes up frequently during the night. Ends up sleeping 3-4 hours per night. Daily naps. He wakes up unrefreshed in the morning. He sleep talks. Denies any other abnormal behavior and sleep.  Patient has chronic kidney disease, most likely will end up being hemodialysis dependent. Patient has worsening eye vision related to uncontrolled diabetes. Patient also likely has asthma with recent dyspnea related to asthma and fluid overload.  ESS 12. Neck circumference is 16 inches.   1. We discussed about the diagnosis of Obstructive Sleep Apnea (OSA) and implications of untreated OSA. We discussed about CPAP and BiPaP as possible treatment options.   2. We will schedule the patient for a home sleep study once he gets his Medicaid. I instructed his niece, [name], to give [name] a call once he gets Medicaid. Hopefully that's within the next month. Plan to get a home sleep study since it can be done sooner. 3. If with sleep apnea, we can try him on an auto CPAP, 5-15 cm water. 4. He will need a 1 month download. If with no good correction, likely will need an in lab study. 5. Patient was instructed to call the office if he/she has not heard back from the office 1-2 weeks after the sleep study.  5. Patient was instructed to call the office if he/she is having issues with the PAP device.  6. We discussed good sleep hygiene.   7. Patient was advised not to engage in activities requiring concentration and/or vigilance if he/she is sleepy.  Patient was advised not to drive if he/she is sleepy.   8. Complicated case as patient has significant comorbidities namely: CKD which will eventually need dialysis, pulmonary edema, possible asthma is/COPD, worsening vision related to diabetes.

## 2015-10-01 NOTE — Assessment & Plan Note (Signed)
Patient with worsening renal function. Has CK D stage IV.  Status post AVF on the right upper extremity. Most likely will be HD dependent. Awaiting Medicaid. Recent dyspnea and pulmonary edema better on diuretics.

## 2015-10-02 ENCOUNTER — Telehealth: Payer: Self-pay | Admitting: *Deleted

## 2015-10-02 MED FILL — ?FUROSEMIDE 80MG TABLET: 80 | 30 days supply | Qty: 60 | Fill #0

## 2015-10-02 NOTE — Telephone Encounter (Signed)
-----   Message from Arnoldo Morale, MD sent at 10/02/2015  1:00 PM EDT ----- Labs still reveal renal decline; advised to keep appointment with nephrology

## 2015-10-02 NOTE — Telephone Encounter (Signed)
[place] with [employer] interpre[employer]  Left message rn would call back tomorrow on home and mobile numbers

## 2015-10-06 NOTE — Telephone Encounter (Signed)
[place] interpreters  Attempted all three numbers listed in the chart.  Left messages on all

## 2015-10-06 NOTE — Telephone Encounter (Signed)
-----   Message from Arnoldo Morale, MD sent at 10/02/2015  1:00 PM EDT ----- Labs still reveal renal decline; advised to keep appointment with nephrology

## 2015-10-08 MED FILL — ?CARVEDILOL 25 MG TABLET: 25 | 30 days supply | Qty: 60 | Fill #1

## 2015-10-08 MED FILL — PRAVASTATIN NA 40 MG TAB: 40 | 30 days supply | Qty: 30 | Fill #1

## 2015-10-08 MED FILL — AMLODIPINE BESYLATE 10 MG T: 10 | 30 days supply | Qty: 30 | Fill #1

## 2015-10-13 ENCOUNTER — Telehealth: Payer: Self-pay | Admitting: *Deleted

## 2015-10-13 NOTE — Telephone Encounter (Signed)
Lab results given

## 2015-10-13 NOTE — Telephone Encounter (Signed)
-----   Message from Arnoldo Morale, MD sent at 10/02/2015  1:00 PM EDT ----- Labs still reveal renal decline; advised to keep appointment with nephrology

## 2015-10-14 ENCOUNTER — Encounter: Payer: Self-pay | Admitting: Vascular Surgery

## 2015-10-15 ENCOUNTER — Ambulatory Visit (HOSPITAL_COMMUNITY)
Admission: RE | Admit: 2015-10-15 | Discharge: 2015-10-15 | Disposition: A | Discharge status: Home / Self Care | Payer: Self-pay | Source: Ambulatory Visit | Attending: Vascular Surgery | Admitting: Vascular Surgery

## 2015-10-15 DIAGNOSIS — I120 Hypertensive chronic kidney disease with stage 5 chronic kidney disease or end stage renal disease: Secondary | ICD-10-CM | POA: Insufficient documentation

## 2015-10-15 DIAGNOSIS — E1122 Type 2 diabetes mellitus with diabetic chronic kidney disease: Secondary | ICD-10-CM | POA: Insufficient documentation

## 2015-10-15 DIAGNOSIS — N186 End stage renal disease: Secondary | ICD-10-CM | POA: Insufficient documentation

## 2015-10-15 DIAGNOSIS — Z4931 Encounter for adequacy testing for hemodialysis: Secondary | ICD-10-CM | POA: Insufficient documentation

## 2015-10-15 DIAGNOSIS — E11319 Type 2 diabetes mellitus with unspecified diabetic retinopathy without macular edema: Secondary | ICD-10-CM | POA: Insufficient documentation

## 2015-10-22 ENCOUNTER — Encounter: Payer: Self-pay | Admitting: Vascular Surgery

## 2015-10-22 ENCOUNTER — Ambulatory Visit (INDEPENDENT_AMBULATORY_CARE_PROVIDER_SITE_OTHER): Payer: Self-pay | Admitting: Vascular Surgery

## 2015-10-22 VITALS — BP 157/84 | HR 64 | Ht 65.5 in | Wt 189.8 lb

## 2015-10-22 DIAGNOSIS — N184 Chronic kidney disease, stage 4 (severe): Secondary | ICD-10-CM

## 2015-10-22 NOTE — Progress Notes (Signed)
Patient name: Eric [facility] MRN: [id number] DOB: [birth year] Sex: male  REASON FOR VISIT: Postop  HPI: Eric [facility] is a 50 y.o. male who presents for postoperative evaluation status post right radial cephalic AV fistula creation on 09/04/2015. The history was obtained with a Spanish interpreter present. The patient is not yet on dialysis. He has reported some numbness of his thumb that has been constant. He denies any pain of his right hand. He does report pain in his right upper arm when he coughs. He has complained of being more congested lately.  Current Outpatient Prescriptions  Medication Sig Dispense Refill  . albuterol (PROVENTIL HFA;VENTOLIN HFA) 108 (90 Base) MCG/ACT inhaler Inhale 2 puffs into the lungs every 6 (six) hours as needed for wheezing or shortness of breath. 1 Inhaler 6  . amLODipine (NORVASC) 10 MG tablet Take 1 tablet (10 mg total) by mouth daily. 30 tablet 2  . budesonide-formoterol (SYMBICORT) 80-4.5 MCG/ACT inhaler Inhale 2 puffs into the lungs 2 (two) times daily. 1 Inhaler 0  . carvedilol (COREG) 25 MG tablet Take 1 tablet (25 mg total) by mouth 2 (two) times daily with a meal. 60 tablet 2  . furosemide (LASIX) 80 MG tablet Take 1 tablet (80 mg total) by mouth 2 (two) times daily. 60 tablet 1  . glipiZIDE (GLUCOTROL) 5 MG tablet Take 1 tablet (5 mg total) by mouth 2 (two) times daily before a meal. 60 tablet 2  . lactulose (CHRONULAC) 10 GM/15ML solution Take 15 mLs (10 g total) by mouth 2 (two) times daily as needed for mild constipation. 946 mL 2  . loratadine (CLARITIN) 10 MG tablet Take 1 tablet (10 mg total) by mouth daily. 10 tablet 0  . omega-3 acid ethyl esters (LOVAZA) 1 g capsule Take 1 capsule (1 g total) by mouth 2 (two) times daily. 60 capsule 1  . OVER THE COUNTER MEDICATION Place 1 spray into both nostrils daily as needed (congestion). Reported on 10/01/2015    . pravastatin (PRAVACHOL) 40 MG tablet Take 1 tablet (40 mg total) by  mouth daily at 6 PM. 30 tablet 2  . Blood Glucose Monitoring Suppl (TRUE METRIX METER) DEVI 1 each by Does not apply route 3 (three) times daily before meals. (Patient not taking: Reported on 10/01/2015) 1 Device 0  . glucose blood (TRUE METRIX BLOOD GLUCOSE TEST) test strip Use three times daily before meals (Patient not taking: Reported on 10/01/2015) 100 each 12  . TRUEPLUS LANCETS 28G MISC 1 each by Does not apply route 3 (three) times daily before meals. (Patient not taking: Reported on 10/01/2015) 100 each 12   No current facility-administered medications for this visit.    REVIEW OF SYSTEMS:  [X]  denotes positive finding, [ ]  denotes negative finding Cardiac  Comments:  Chest pain or chest pressure:    Shortness of breath upon exertion:    Short of breath when lying flat:    Irregular heart rhythm:    Constitutional    Fever or chills:      PHYSICAL EXAM: Filed Vitals:   10/22/15 1408 10/22/15 1410  BP: 156/84 157/84  Pulse: 64   Height: 5' 5.5" (1.664 m)   Weight: 189 lb 12.8 oz (86.093 kg)   SpO2: 98%     GENERAL: The patient is a well-nourished male, in no acute distress. The vital signs are documented above. PULMONARY: Nonlabored respiratory effort. VASCULAR: palpable thrill right wrist AV fistula. Hand is warm and pink.  DATA:  Dialysis access  duplex 10/15/15  Diameter of 0.56 cm at distal forearm, 0.41 at mid forearm, 0.44 cm at proximal forearm, 0.49 at antecubital fossa Depth: 0.26 cm distal forearm, 0.41 antecubital fossa  MEDICAL ISSUES: CKD stage IV  The patient's access is not fully mature. He is not yet on dialysis. The patient has complained of right thumb numbness without hand pain. He is able to tolerate this. We'll have the patient follow-up in 2 months with repeat dialysis access duplex. Advised the patient to contact the office if he develops worsening numbness or if he develops pain of his right hand.  Virgina Jock, PA-C [facility]

## 2015-10-24 ENCOUNTER — Telehealth: Payer: Self-pay | Admitting: Family Medicine

## 2015-10-24 NOTE — Telephone Encounter (Signed)
Patients sister called stating that the patient has been having really dry nose.  Patient would like a call back.

## 2015-10-27 MED FILL — ?FUROSEMIDE 80MG TABLET: 80 | 26 days supply | Qty: 52 | Fill #1

## 2015-10-27 MED FILL — glipiZIDE 5 MG TABS: 5 | 30 days supply | Qty: 60 | Fill #2

## 2015-11-05 MED FILL — ?AMLODIPINE BESYLATE 10 MG: 10 | 30 days supply | Qty: 30 | Fill #2

## 2015-11-05 MED FILL — CARVEDILOL 25 MG TABLET: 25 | 30 days supply | Qty: 60 | Fill #2

## 2015-11-05 MED FILL — ?PRAVASTATIN NA 40 MG TAB: 40 MG | 30 days supply | Qty: 30 | Fill #2

## 2015-11-21 ENCOUNTER — Ambulatory Visit (HOSPITAL_BASED_OUTPATIENT_CLINIC_OR_DEPARTMENT_OTHER): Payer: Self-pay | Attending: Family Medicine | Admitting: Internal Medicine

## 2015-11-21 VITALS — Ht 63.0 in | Wt 180.0 lb

## 2015-11-21 DIAGNOSIS — R0683 Snoring: Secondary | ICD-10-CM | POA: Insufficient documentation

## 2015-11-21 DIAGNOSIS — R5383 Other fatigue: Secondary | ICD-10-CM | POA: Insufficient documentation

## 2015-11-21 DIAGNOSIS — N184 Chronic kidney disease, stage 4 (severe): Secondary | ICD-10-CM | POA: Insufficient documentation

## 2015-11-21 DIAGNOSIS — G4733 Obstructive sleep apnea (adult) (pediatric): Secondary | ICD-10-CM | POA: Insufficient documentation

## 2015-11-25 ENCOUNTER — Other Ambulatory Visit: Payer: Self-pay | Admitting: Family Medicine

## 2015-11-25 MED FILL — FUROSEMIDE 80 MG TABLET: 80 | 4 days supply | Qty: 8 | Fill #2

## 2015-11-27 ENCOUNTER — Other Ambulatory Visit: Payer: Self-pay | Admitting: Pharmacist

## 2015-11-27 ENCOUNTER — Ambulatory Visit: Payer: Self-pay | Admitting: Pulmonary Disease

## 2015-11-27 ENCOUNTER — Other Ambulatory Visit: Payer: Self-pay | Admitting: Family Medicine

## 2015-11-27 MED ORDER — AMLODIPINE BESYLATE 10 MG PO TABS: 10.0000 mg | ORAL_TABLET | Freq: Every day | ORAL | Status: DC

## 2015-11-27 MED FILL — glipiZIDE 5 MG TABS: 5 | 30 days supply | Qty: 60 | Fill #0

## 2015-11-27 MED FILL — LACTULOSE 10 GM/15 ML SOLN: 10 | 32 days supply | Qty: 946 | Fill #1

## 2015-11-27 NOTE — Telephone Encounter (Signed)
Patient was informed of prescription refilled

## 2015-11-29 DIAGNOSIS — G4733 Obstructive sleep apnea (adult) (pediatric): Secondary | ICD-10-CM

## 2015-11-29 DIAGNOSIS — N184 Chronic kidney disease, stage 4 (severe): Secondary | ICD-10-CM

## 2015-11-29 NOTE — Procedures (Signed)
  Patient Name: Breyden, Domer Date: 11/21/2015 Gender: Male D.O.B: 1966/04/10 Age (years): 2 Referring Provider: Arnoldo Morale Height (inches): 63 Interpreting Physician: Baird Lyons MD, ABSM Weight (lbs): 180 RPSGT: Baxter Flattery BMI: 32 MRN: [id number] Neck Size: 17.00 CLINICAL INFORMATION Sleep Study Type: NPSG Indication for sleep study: Fatigue, Snoring Epworth Sleepiness Score: 9  SLEEP STUDY TECHNIQUE As per the AASM Manual for the Scoring of Sleep and Associated Events v2.3 (April 2016) with a hypopnea requiring 4% desaturations. The channels recorded and monitored were frontal, central and occipital EEG, electrooculogram (EOG), submentalis EMG (chin), nasal and oral airflow, thoracic and abdominal wall motion, anterior tibialis EMG, snore microphone, electrocardiogram, and pulse oximetry.  MEDICATIONS Patient's medications include: charted for review. Medications self-administered by patient during sleep study : No sleep medicine administered.  SLEEP ARCHITECTURE The study was initiated at 10:01:11 PM and ended at 4:30:54 AM. Sleep onset time was 1.5 minutes and the sleep efficiency was 78.6%. The total sleep time was 306.5 minutes. Stage REM latency was 16.5 minutes. The patient spent 8.32% of the night in stage N1 sleep, 62.97% in stage N2 sleep, 7.99% in stage N3 and 20.72% in REM. Alpha intrusion was absent. Slept supine with head elevated  RESPIRATORY PARAMETERS The overall apnea/hypopnea index (AHI) was 11.7 per hour. There were 3 total apneas, including 0 obstructive, 2 central and 1 mixed apneas. There were 57 hypopneas and 0 RERAs. The AHI during Stage REM sleep was 24.6 per hour. The mean oxygen saturation was 94.73%. The minimum SpO2 during sleep was 79.00%. Moderate snoring was noted during this study.  CARDIAC DATA The 2 lead EKG demonstrated sinus rhythm. The mean heart rate was 58.37 beats per minute. Other EKG findings include:  None.  LEG MOVEMENT DATA The total PLMS were 74 with a resulting PLMS index of 14.49. Associated arousal with leg movement index was 0.0 .  IMPRESSIONS - Mild obstructive sleep apnea occurred during this study (AHI = 11.7/h). - No significant central sleep apnea occurred during this study (CAI = 0.4/h). - Moderate oxygen desaturation was noted during this study (Min O2 = 79.00%). - The patient snored with Moderate snoring volume. - No cardiac abnormalities were noted during this study. - Mild periodic limb movements of sleep occurred during the study. No significant associated arousals.  DIAGNOSIS - Obstructive Sleep Apnea (327.23 [G47.33 ICD-10])  RECOMMENDATIONS - Therapeutic CPAP titration or a fitted dental oral appliance may be effective in this score range. - Avoid alcohol, sedatives and other CNS depressants that may worsen sleep apnea and disrupt normal sleep architecture. - Sleep hygiene should be reviewed to assess factors that may improve sleep quality. - Weight management and regular exercise should be initiated or continued if appropriate.  Deneise Lever Diplomate, American Board of Sleep Medicine  ELECTRONICALLY SIGNED ON:  11/29/2015, 12:13 PM [place] PH: [phone]   FX: [phone] [place]

## 2015-12-01 ENCOUNTER — Other Ambulatory Visit: Payer: Self-pay | Admitting: Family Medicine

## 2015-12-01 ENCOUNTER — Telehealth: Payer: Self-pay | Admitting: Family Medicine

## 2015-12-01 MED FILL — ?AMLODIPINE BESYLATE 10 MG: 10 | 30 days supply | Qty: 30 | Fill #0

## 2015-12-01 NOTE — Telephone Encounter (Signed)
Patient needs furosemide. Please follow up.  Patient would like to use Product/process development scientist on [facility].  Please follow up.

## 2015-12-01 NOTE — Telephone Encounter (Signed)
Pt. Called requesting a refill on furosemide (LASIX) 80 MG tablet.  Please f/u with pt.

## 2015-12-02 MED FILL — FUROSEMIDE 80 MG TABLET: 80 | 30 days supply | Qty: 60 | Fill #0

## 2015-12-04 NOTE — Telephone Encounter (Signed)
Patient's Lasix was refilled by Erline Levine, Clinical Pharmacist.

## 2015-12-08 ENCOUNTER — Emergency Department (HOSPITAL_COMMUNITY): Payer: MEDICAID

## 2015-12-08 ENCOUNTER — Telehealth: Payer: Self-pay

## 2015-12-08 ENCOUNTER — Emergency Department (HOSPITAL_COMMUNITY)
Admission: EM | Admit: 2015-12-08 | Discharge: 2015-12-08 | Disposition: A | Discharge status: Home / Self Care | Payer: MEDICAID | Attending: Emergency Medicine | Admitting: Emergency Medicine

## 2015-12-08 ENCOUNTER — Encounter (HOSPITAL_COMMUNITY): Payer: Self-pay

## 2015-12-08 ENCOUNTER — Other Ambulatory Visit: Payer: Self-pay | Admitting: Family Medicine

## 2015-12-08 DIAGNOSIS — R079 Chest pain, unspecified: Secondary | ICD-10-CM | POA: Insufficient documentation

## 2015-12-08 DIAGNOSIS — E11319 Type 2 diabetes mellitus with unspecified diabetic retinopathy without macular edema: Secondary | ICD-10-CM | POA: Insufficient documentation

## 2015-12-08 DIAGNOSIS — N184 Chronic kidney disease, stage 4 (severe): Secondary | ICD-10-CM | POA: Insufficient documentation

## 2015-12-08 DIAGNOSIS — R0601 Orthopnea: Secondary | ICD-10-CM | POA: Insufficient documentation

## 2015-12-08 DIAGNOSIS — R002 Palpitations: Secondary | ICD-10-CM | POA: Insufficient documentation

## 2015-12-08 DIAGNOSIS — Z79899 Other long term (current) drug therapy: Secondary | ICD-10-CM | POA: Insufficient documentation

## 2015-12-08 DIAGNOSIS — I129 Hypertensive chronic kidney disease with stage 1 through stage 4 chronic kidney disease, or unspecified chronic kidney disease: Secondary | ICD-10-CM | POA: Insufficient documentation

## 2015-12-08 DIAGNOSIS — J069 Acute upper respiratory infection, unspecified: Secondary | ICD-10-CM

## 2015-12-08 DIAGNOSIS — E7800 Pure hypercholesterolemia, unspecified: Secondary | ICD-10-CM | POA: Insufficient documentation

## 2015-12-08 DIAGNOSIS — R600 Localized edema: Secondary | ICD-10-CM | POA: Insufficient documentation

## 2015-12-08 DIAGNOSIS — Z862 Personal history of diseases of the blood and blood-forming organs and certain disorders involving the immune mechanism: Secondary | ICD-10-CM | POA: Insufficient documentation

## 2015-12-08 DIAGNOSIS — Z87891 Personal history of nicotine dependence: Secondary | ICD-10-CM | POA: Insufficient documentation

## 2015-12-08 LAB — CBC
HEMATOCRIT: 27.9 % — AB (ref 39.0–52.0)
Hemoglobin: 9.7 g/dL — ABNORMAL LOW (ref 13.0–17.0)
MCH: 27.3 pg (ref 26.0–34.0)
MCHC: 34.8 g/dL (ref 30.0–36.0)
MCV: 78.6 fL (ref 78.0–100.0)
Platelets: 163 10*3/uL (ref 150–400)
RBC: 3.55 MIL/uL — ABNORMAL LOW (ref 4.22–5.81)
RDW: 12.6 % (ref 11.5–15.5)
WBC: 9.2 10*3/uL (ref 4.0–10.5)

## 2015-12-08 LAB — BASIC METABOLIC PANEL
Anion gap: 8 (ref 5–15)
BUN: 56 mg/dL — AB (ref 6–20)
CHLORIDE: 103 mmol/L (ref 101–111)
CO2: 24 mmol/L (ref 22–32)
CREATININE: 4.18 mg/dL — AB (ref 0.61–1.24)
Calcium: 8.9 mg/dL (ref 8.9–10.3)
GFR calc Af Amer: 18 mL/min — ABNORMAL LOW (ref >60)
GFR calc non Af Amer: 15 mL/min — ABNORMAL LOW (ref >60)
GLUCOSE: 256 mg/dL — AB (ref 65–99)
POTASSIUM: 4.5 mmol/L (ref 3.5–5.1)
Sodium: 135 mmol/L (ref 135–145)

## 2015-12-08 LAB — I-STAT TROPONIN, ED: Troponin i, poc: 0 ng/mL (ref 0.00–0.08)

## 2015-12-08 LAB — BRAIN NATRIURETIC PEPTIDE: B Natriuretic Peptide: 76.5 pg/mL (ref 0.0–100.0)

## 2015-12-08 MED ORDER — DEXTROSE 5 % IV SOLN
160.0000 mg | INTRAVENOUS | Status: AC
  Administered 2015-12-08: 160 mg via INTRAVENOUS
  Filled 2015-12-08: qty 16

## 2015-12-08 MED FILL — ?PRAVASTATIN NA 40 MG TAB: 40 MG | 30 days supply | Qty: 30 | Fill #0

## 2015-12-08 MED FILL — CARVEDILOL 25 MG TABLET: 25 | 30 days supply | Qty: 60 | Fill #0

## 2015-12-08 NOTE — Discharge Instructions (Signed)
For nasal congestion and nasal discharge may take over the counter saline nasal spray and/or use a cool mist humidifier.    Vaporizadores de Occupational hygienist fro Buyer, retail) Los vaporizadores ayudan a Public house manager los sntomas de la tos y Control and instrumentation engineer. Agregan humedad al aire, lo que fluidifica el moco y lo hace menos espeso. Facilitan la respiracin y favorecen la eliminacin de secreciones. Los vaporizadores de aire fro no provocan quemaduras serias [employer] de aire caliente, que tambin se llaman humidificadores. No se ha probado que los vaporizadores mejoren el resfro. No debe usar un vaporizador si es Administrator, arts. INSTRUCCIONES PARA EL CUIDADO EN EL HOGAR  Siga las instrucciones para el uso del vaporizador que se encuentran en la caja.  Use solamente agua destilada en el vaporizador.  No use el vaporizador en forma continua. Esto puede formar moho o hacer que se desarrollen bacterias en el vaporizador.  Limpie el vaporizador cada vez que se use.  Lmpielo y squelo bien antes de guardarlo.  Deje de usarlo si los sntomas respiratorios empeoran.   Esta informacin no tiene Marine scientist el consejo del mdico. Asegrese de hacerle al mdico cualquier pregunta que tenga.   Document Released: 03/07/2013 Document Revised: 07/10/2013 Elsevier Interactive Patient Education 2016 [place] tracto respiratorio superior, adultos (Upper Respiratory Infection, Adult) La mayora de las infecciones del tracto respiratorio superior son infecciones virales de las vas que llevan el aire a los pulmones. Un infeccin del tracto respiratorio superior afecta la nariz, la garganta y las vas respiratorias superiores. El tipo ms frecuente de infeccin del tracto respiratorio superior es la nasofaringitis, que habitualmente se conoce como "resfro comn". Las infecciones del tracto respiratorio superior siguen su curso y por lo general se curan solas. En la [employer], la  infeccin del tracto respiratorio superior no requiere atencin Muir, Armed forces training and education officer a veces, despus de una infeccin viral, puede surgir una infeccin bacteriana en las vas respiratorias superiores. Esto se conoce como infeccin secundaria. Las infecciones sinusales y en el odo medio son tipos frecuentes de infecciones secundarias en el tracto respiratorio superior. La neumona bacteriana tambin puede complicar un cuadro de infeccin del tracto respiratorio superior. Este tipo de infeccin puede empeorar el asma y la enfermedad pulmonar obstructiva crnica (EPOC). En algunos casos, estas complicaciones pueden requerir atencin mdica de emergencia y poner en peligro la vida.  CAUSAS Casi todas las infecciones del tracto respiratorio superior se deben a los virus. Un virus es un tipo de microbio que puede contagiarse de Ardelia Mems persona a Theatre manager.  FACTORES DE RIESGO Puede estar en riesgo de sufrir una infeccin del tracto respiratorio superior si:   Fuma.  Tiene una enfermedad pulmonar o cardaca crnica.  Tiene debilitado el sistema de defensa (inmunitario) del cuerpo.  Es muy joven o de edad muy [place].  Tiene asma o alergias nasales.  Trabaja en reas donde hay mucha gente o poca ventilacin.  Governor Rooks en una escuela o en un centro de atencin mdica. SIGNOS Y SNTOMAS  Habitualmente, los sntomas aparecen de 2a 3das despus de entrar en contacto con el virus del resfro. La mayora de las infecciones virales en el tracto respiratorio superior duran de 7a 10das. Sin embargo, las infecciones virales en el tracto respiratorio superior a causa del virus de la gripe pueden durar de 14a 18das y, habitualmente, son ms graves. Entre los sntomas se pueden incluir los siguientes:   Secrecin o congestin nasal.  Estornudos.  Tos.  Dolor de  garganta.  Dolor de [place].  Fatiga.  Cristy Hilts.  Prdida del apetito.  Dolor en la frente, detrs de los ojos y por encima de los pmulos (dolor  sinusal).  Dolores musculares. DIAGNSTICO  El mdico puede diagnosticar una infeccin del tracto respiratorio superior mediante los siguientes estudios:  Examen fsico.  Pruebas para verificar si los sntomas no se deben a otra afeccin, por ejemplo:  Faringitis estreptoccica.  Sinusitis.  Neumona.  Asma. TRATAMIENTO  Esta infeccin desaparece sola, con el tiempo. No puede curarse con medicamentos, pero a menudo se prescriben para aliviar los sntomas. Los medicamentos pueden ser tiles para lo siguiente:  Engineer, materials fiebre.  Reducir la tos.  Aliviar la congestin nasal. INSTRUCCIONES PARA EL CUIDADO EN EL HOGAR   Tome los medicamentos solamente como se lo haya indicado el mdico.  A fin de Best boy de garganta, haga grgaras con solucin salina templada o consuma caramelos para la tos, como se lo haya indicado el mdico.  Use un humidificador de vapor clido o inhale el vapor de la ducha para aumentar la humedad del aire. Esto facilitar la respiracin.  Beba suficiente lquido para Consulting civil engineer orina clara o de color amarillo plido.  Consuma sopas y otros caldos transparentes, y [employer].  Descanse todo lo que sea necesario.  Regrese al [place] cuando la temperatura se le haya normalizado o cuando el mdico lo autorice. Es posible que deba quedarse en su casa durante un tiempo prolongado, para no infectar a los dems. [place] usar un barbijo y lavarse las manos con cuidado para Mining engineer propagacin del virus.  Aumente el uso del inhalador si tiene asma.  No consuma ningn producto que contenga tabaco, lo que incluye cigarrillos, tabaco de Higher education careers adviser o Psychologist, sport and exercise. Si necesita ayuda para dejar de fumar, consulte al [employer]. PREVENCIN  La mejor manera de protegerse de un resfro es mantener una higiene [place].   Evite el contacto oral o fsico con personas que tengan sntomas de resfro.  En caso de contacto, lvese las manos con  frecuencia. No hay pruebas claras de que la vitaminaC, la vitaminaE, la equincea o el ejercicio reduzcan la probabilidad de Museum/gallery curator un resfro. Sin embargo, siempre se recomienda Scientific laboratory technician, hacer ejercicio y Ecologist.  SOLICITE ATENCIN MDICA SI:   Su estado empeora en lugar de mejorar.  Los medicamentos no Animator.  Tiene escalofros.  La sensacin de falta de aire empeora.  Tiene mucosidad marrn o roja.  Tiene secrecin nasal amarilla o marrn.  Le duele la cara, especialmente al inclinarse hacia adelante.  Tiene fiebre.  Tiene los ganglios del cuello hinchados.  Siente dolor al tragar.  Tiene zonas blancas en la parte de atrs de la garganta. SOLICITE ATENCIN MDICA DE INMEDIATO SI:   Tiene sntomas intensos o persistentes de:  Dolor de [place].  Dolor de odos.  Dolor sinusal.  Dolor en el pecho.  Tiene enfermedad pulmonar crnica y cualquiera de estos sntomas:  Sibilancias.  Tos prolongada.  Tos con sangre.  Cambio en la mucosidad habitual.  Presenta rigidez en el cuello.  Tiene cambios en:  La visin.  La audicin.  El pensamiento.  El [place] de nimo. ASEGRESE DE QUE:   Comprende estas instrucciones.  Controlar su afeccin.  Recibir ayuda de inmediato si no mejora o si empeora.   Esta informacin no tiene Marine scientist el consejo del mdico. Asegrese de hacerle al mdico cualquier pregunta que tenga.   Document Released: 04/14/2005  Document Revised: 11/19/2014 Elsevier Interactive Patient Education 2016 [place] de aire (Shortness of Breath) La falta de aire significa que tiene dificultad para respirar. Tambin puede significar que tiene un problema mdico. Debe solicitar atencin mdica de inmediato si siente que Risk manager. CAUSAS   Insuficiente oxgeno en el aire, como en las grandes alturas o un ambiente lleno de humo.  Ciertas enfermedades pulmonares,  infecciones o problemas.  Enfermedades o afecciones cardacas, como angina de pecho o insuficiencia cardaca.  Nivel bajo de glbulos rojos (anemia).  Condicin fsica deficiente, que puede provocar falta de aire al hacer ejercicio.  Lesiones o entumecimiento en el pecho o en la espalda.  Tener sobrepeso.  Fumar.  Ansiedad, que puede hacer que sienta que no tiene aire suficiente. DIAGNSTICO  A menudo, los problemas mdicos graves se encuentran durante el examen fsico. Para determinar porqu sufre de falta de aire podrn realizarle diferentes pruebas. Entre ellas:  Radiografa de trax.  Pruebas funcionales respiratorias.  Anlisis de [place].  Un electrocardiograma (ECG).  Un electrocardiograma ambulatorio. Un ECG ambulatorio registra los patrones de los latidos cardacos durante 24horas.  Prueba de ejercicio.  Un ecocardiograma transtorcico (ETT). Durante IT trainer, se usan ondas sonoras para evaluar el flujo de la sangre a travs del corazn.  Un ecocardiograma transesofgico (ETE).  Diagnsticos por imgenes. Puede ser que el mdico no encuentre una causa para su falta de aire despus del examen. En este caso, es importante que concurra a un examen de control, segn las indicaciones del mdico.  TRATAMIENTO  El tratamiento de la falta de aire depende de la causa de sus sntomas y Economist. INSTRUCCIONES PARA EL CUIDADO EN EL HOGAR   No fumar. Fumar es una causa frecuente de la falta de aire. Si fuma, solicite ayuda para dejar de hacerlo.  Evite estar cerca de sustancias qumicas o de aquellas cosas que puedan interferir en la respiracin, como vapores de pinturas y polvo.  Descanse todo lo que sea necesario. Retome sus actividades habituales gradualmente.  Si le indicaron medicamentos, tmelos como se los han prescrito durante todo el tiempo indicado. Estos incluyen el oxgeno y los medicamentos inhalados.  Cumpla con todas las visitas de  control, segn le indique su mdico. SOLICITE ATENCIN MDICA SI:   Su afeccin no mejora en el tiempo esperado.  Le cuesta hacer las actividades cotidianas, aun si ha descansado lo suficiente.  Aparece algn sntoma nuevo. [place] DE INMEDIATO SI:   La falta de aire empeora.  Se siente aturdido, se desmaya o tiene tos que no controla con medicamentos.  Elimina sangre al toser.  Siente dolor al respirar.  Tiene dolor de pecho o en los brazos, hombros o abdomen.  Tiene fiebre.  No logra subir escaleras o realizar ejercicio del modo en que lo haca habitualmente. ASEGRESE DE QUE:  Comprende estas instrucciones.  Controlar su afeccin.  Recibir ayuda de inmediato si no mejora o si empeora.   Esta informacin no tiene Marine scientist el consejo del mdico. Asegrese de hacerle al mdico cualquier pregunta que tenga.   Document Released: 04/14/2005 Document Revised: 07/10/2013 Elsevier Interactive Patient Education 2016 Elsevier Inc.  Edema perifrico (Peripheral Edema) Usted sufre hinchazn en las piernas, lo que se denomina edema perifrico. Esta hinchazn se debe al exceso de acumulacin de sal y agua en el organismo. El edema puede ser un signo de enfermedad cardaca, renal o heptica, o el efecto secundario de un medicamento. Tambin puede deberse  a otros problemas en [place] piernas. Si el problema se debe a una circulacin venosa deficiente, eleve las piernas y [place] medias especiales de soporte. Evite permanecer de pie durante largos perodos. El tratamiento depende de la causa. Los chips, pickles y otros alimentos salados debern evitarse. Casi siempre es necesario restringir la sal en la dieta. Le indicarn diurticos para eliminar el exceso de sal y agua del organismo por la [place]. Estos medicamentos evitan que el rin absorba el sodio. Aumentan el flujo de [place]. El tratamiento con diurticos tambin [employer] niveles de potasio  del [place]. Ser necesario que utilice suplementos de potasio si toma diurticos [employer]. Controle el peso diario para verificar los progresos en la mejora del edema. Comunquese con su mdico para realizar un control segn las indicaciones. SOLICITE ATENCIN MDICA DE INMEDIATO SI:  Aumenta en gran medida la hinchazn, el dolor, la inflamacin o el calor en las piernas.  Le falta el aire, especialmente estando [place].  Siente dolor en el pecho o en el abdomen, debilidad, o se marea.  Tiene fiebre.   Esta informacin no tiene Marine scientist el consejo del mdico. Asegrese de hacerle al mdico cualquier pregunta que tenga.   Document Released: 07/05/2005 Document Revised: 09/27/2011 Elsevier Interactive Patient Education [employer].

## 2015-12-08 NOTE — ED Notes (Signed)
Pt here with c/o SOB and CP that has been intermittent for the past month. He states he came in tonight because he cant sleep due to the CP and SOB. He also reports swelling to bilateral feet. Skin warm, dry, NAD, RR unlabored.

## 2015-12-08 NOTE — ED Provider Notes (Signed)
CSN: [id number]     Arrival date & time 12/08/15  0050 History   First MD Initiated Contact with Patient 12/08/15 [phone]     Chief Complaint  Patient presents with  . Shortness of Breath  . Chest Pain     (Consider location/radiation/quality/duration/timing/severity/associated sxs/prior Treatment) The history is provided by the patient and a relative. The history is limited by a language barrier. A language interpreter was used.     Eric Glenn is a 50 y.o. male with PMHx of DM, HTN, CKD stage 4 with recent placement of right AV fistula not currently on dialysis, he presents emergency department with 1 month of gradually progressing shortness of breath and chest pain. Last night he was unable to sleep due to orthopnea. He also reports worsening edema of his lower extremities.  He does have URI symptoms clear nasal discharge and cough with clear sputum. He states he has not had any fevers, is not short of breath at rest but is extremely short of breath when trying to lay back or with minimal exertion. He normally was able to walk around his home without being short of breath however in the last few days is only able to walk a few feet before becoming short of breath.  He has chest pain only when he is short of breath after exerting himself. He denies any chest pain at rest. He also endorses palpitations and rapid heart rate intermittently.  In February he had a right AV fistula placed but is not currently on hemodialysis.  He is a  [place] patient.  Past Medical History  Diagnosis Date  . Diabetes mellitus without complication ([place])   . Hypertension   . High cholesterol   . Chronic kidney disease (CKD), stage IV (severe) ([place])   . Diabetic retinopathy ([place])   . Anemia    Past Surgical History  Procedure Laterality Date  . Umbilical hernia repair    . Av fistula placement Right 09/04/2015    Procedure: ARTERIOVENOUS (AV) FISTULA CREATION;  Surgeon: Angelia Mould,  MD;  [facility];  Service: Vascular;  Laterality: Right;   Family History  Problem Relation Age of Onset  . Hypertension Mother   . Hyperlipidemia Mother   . Diabetes Mellitus II Sister    Social History  Substance Use Topics  . Smoking status: Former Smoker    Quit date: 10/22/2003  . Smokeless tobacco: Never Used  . Alcohol Use: No    Review of Systems  Constitutional: Positive for chills. Negative for fever, diaphoresis and fatigue.  HENT: Positive for congestion and rhinorrhea. Negative for sore throat.   Eyes: Negative.   Respiratory: Positive for cough and shortness of breath. Negative for apnea, choking, chest tightness, wheezing and stridor.   Cardiovascular: Positive for chest pain, palpitations and leg swelling.  Gastrointestinal: Negative.   Endocrine: Negative.   Genitourinary: Negative.   Musculoskeletal: Negative.   Neurological: Negative.   Psychiatric/Behavioral: Negative.   All other systems reviewed and are negative.     Allergies  Pork-derived products  Home Medications   Prior to Admission medications   Medication Sig Start Date End Date Taking? Authorizing Provider  albuterol (PROVENTIL HFA;VENTOLIN HFA) 108 (90 Base) MCG/ACT inhaler Inhale 2 puffs into the lungs every 6 (six) hours as needed for wheezing or shortness of breath. 10/01/15  Yes [place], MD  amLODipine (NORVASC) 10 MG tablet Take 1 tablet (10 mg total) by mouth daily. 11/27/15  Yes [place]  Jarold Song, MD  budesonide-formoterol (SYMBICORT) 80-4.5 MCG/ACT inhaler Inhale 2 puffs into the lungs 2 (two) times daily. 10/01/15  Yes Pat Shirl Harris, MD  carvedilol (COREG) 25 MG tablet Take 1 tablet (25 mg total) by mouth 2 (two) times daily with a meal. 08/19/15  Yes Arnoldo Morale, MD  furosemide (LASIX) 80 MG tablet TAKE ONE TABLET BY MOUTH TWICE DAILY 12/02/15  Yes Arnoldo Morale, MD  glipiZIDE (GLUCOTROL) 5 MG tablet TAKE 1 TABLET BY MOUTH 2 TIMES DAILY BEFORE A MEAL. 11/27/15  Yes  Arnoldo Morale, MD  Omega-3 Fatty Acids (FISH OIL) 1200 MG CAPS Take 1,200 mg by mouth daily.   Yes Historical Provider, MD  pravastatin (PRAVACHOL) 40 MG tablet Take 1 tablet (40 mg total) by mouth daily at 6 PM. 08/19/15  Yes Arnoldo Morale, MD  Blood Glucose Monitoring Suppl (TRUE METRIX METER) DEVI 1 each by Does not apply route 3 (three) times daily before meals. 08/19/15   Arnoldo Morale, MD  glucose blood (TRUE METRIX BLOOD GLUCOSE TEST) test strip Use three times daily before meals 08/19/15   Arnoldo Morale, MD  lactulose (CHRONULAC) 10 GM/15ML solution Take 15 mLs (10 g total) by mouth 2 (two) times daily as needed for mild constipation. Patient not taking: Reported on 12/08/2015 08/26/15   Arnoldo Morale, MD  loratadine (CLARITIN) 10 MG tablet Take 1 tablet (10 mg total) by mouth daily. Patient not taking: Reported on 12/08/2015 08/06/15   Hosie Poisson, MD  omega-3 acid ethyl esters (LOVAZA) 1 g capsule Take 1 capsule (1 g total) by mouth 2 (two) times daily. Patient not taking: Reported on 12/08/2015 08/06/15   Hosie Poisson, MD  TRUEPLUS LANCETS 28G MISC 1 each by Does not apply route 3 (three) times daily before meals. 08/19/15   Arnoldo Morale, MD   BP 162/84 mmHg  Pulse 64  Temp(Src) 98.9 F (37.2 C) (Oral)  Resp 13  SpO2 98% Physical Exam  Constitutional: He is oriented to person, place, and time. He appears well-developed and well-nourished. No distress.  HENT:  Head: Normocephalic and atraumatic.  Mouth/Throat: Oropharynx is clear and moist. No oropharyngeal exudate.  Clear nasal discharge  Eyes: Conjunctivae and EOM are normal. Pupils are equal, round, and reactive to light. Right eye exhibits no discharge. Left eye exhibits no discharge. No scleral icterus.  Neck: Normal range of motion. JVD present. No tracheal deviation present. No thyromegaly present.  Cardiovascular: Normal rate, regular rhythm, normal heart sounds and intact distal pulses.  Exam reveals no gallop and no friction rub.    No murmur heard. Right radial AV fistula with palpable thrill Bilateral lower extremity pitting edema, right 3+, left 2+  Pulmonary/Chest: Effort normal. No respiratory distress. He has no wheezes. He has rales. He exhibits no tenderness.  Abdominal: Soft. Bowel sounds are normal. He exhibits no distension and no mass. There is no tenderness. There is no rebound and no guarding.  Musculoskeletal: Normal range of motion. He exhibits edema. He exhibits no tenderness.  Lymphadenopathy:    He has no cervical adenopathy.  Neurological: He is alert and oriented to person, place, and time. He has normal reflexes. No cranial nerve deficit. He exhibits normal muscle tone. Coordination normal.  Skin: Skin is warm and dry. No rash noted. He is not diaphoretic. No erythema. No pallor.  Psychiatric: He has a normal mood and affect. His behavior is normal. Judgment and thought content normal.  Nursing note and vitals reviewed.   ED Course  Procedures (  including critical care time) Labs Review Labs Reviewed  BASIC METABOLIC PANEL - Abnormal; Notable for the following:    Glucose, Bld 256 (*)    BUN 56 (*)    Creatinine, Ser 4.18 (*)    GFR calc non Af Amer 15 (*)    GFR calc Af Amer 18 (*)    All other components within normal limits  CBC - Abnormal; Notable for the following:    RBC 3.55 (*)    Hemoglobin 9.7 (*)    HCT 27.9 (*)    All other components within normal limits  BRAIN NATRIURETIC PEPTIDE  I-STAT TROPOININ, ED    Imaging Review Dg Chest 2 View  12/08/2015  CLINICAL DATA:  Chest pain and shortness of breath tonight. EXAM: CHEST  2 VIEW COMPARISON:  Radiographs and CT February 2017. FINDINGS: Increase cardiomegaly from prior exam. Interstitial prominence concerning for mild pulmonary edema. There is background vascular congestion. No confluent airspace disease. Left upper lobe opacity on prior CT was not well seen radiographically. Suspect small left pleural effusion. No  pneumothorax. Chronic deformity of the right acromioclavicular joint. IMPRESSION: Increased cardiomegaly with vascular congestion and mild pulmonary edema. Electronically Signed   By: Jeb Levering M.D.   On: 12/08/2015 02:28   I have personally reviewed and evaluated these images and lab results as part of my medical decision-making.   EKG Interpretation   Date/Time:  Monday Dec 08 2015 00:54:37 EDT Ventricular Rate:  64 PR Interval:  176 QRS Duration: 104 QT Interval:  434 QTC Calculation: 447 R Axis:   16 Text Interpretation:  Normal sinus rhythm Normal ECG Confirmed by HORTON   MD, Loma Sousa ([id number]) on 12/08/2015 6:21:43 AM Also confirmed by Dina Rich  MD,  [place] ([id number]), editor Lorenda Cahill CT, Leda Gauze [phone])  on 12/08/2015 6:55:41  AM      MDM   Patient is a 50 year old male with history of diabetes, hypertension, anemia, obstructive sleep apnea, CKD stage IV with recent placement of right radial cephalic AV fistula, not yet initiated hemodialysis, he presents emergency Department with worsening orthopnea, lower show me edema, exertional dyspnea, exertional chest pain, cough with clear sputum production. He states that his symptoms have been progressive over the past several months, he presents emergency department because he is unable to sleep due to the severity of symptoms.  Patient clinically appears to be in no acute distress, no respiratory distress, able to sit upright in the bed briefly comfortably. He does have a significant amount of lower extremity edema, likely fluid overload.  Will give IV dose of Lasix, basic lab work obtained, BNP added.  Patient will be ambulated with pulse ox in to see if he desaturates and necessitates admission.  He likely will need to initiate dialysis and with fluid overload/CHF sx's that have been progressive.    Case discussed with Dr. Dina Rich who has personally seen and evaluated the pt, she agrees with the work-up and plan.   Patient was able to  ambulate briskly without any exertional shortness of breath, he maintained his oxygen saturation at 99-100%.  BNP was negative.  Patient is stable to discharge home and follow up outpatient with his nephrologist and PCP.  He does have significant medical nasal congestion and nasal discharge, may be URI versus allergies. He is seeing an ENT in 2 days.  Encouraged nasal saline use, cool-mist humidifier, and other conservative treatments until seeing specialist.  Patient also has obstructive sleep apnea, suspect that his orthopnea and difficulty breathing at  night are multifactorial however nonemergent at this time.  Encouraged close follow up.  Return precautions reviewed. Discharged home in good condition.  Filed Vitals:   12/08/15 0800 12/08/15 0830 12/08/15 0900 12/08/15 0912  BP: 167/83 163/82 162/84   Pulse: 65 65 64   Temp:    98.9 F (37.2 C)  TempSrc:    Oral  Resp: 13 14 13    SpO2: 99% 97% 98%       Final diagnoses:  URI (upper respiratory infection)  Bilateral edema of lower extremity  Orthopnea        Delsa Grana, PA-C 12/08/15 [place], MD 12/09/15 2255

## 2015-12-08 NOTE — Telephone Encounter (Signed)
Message from Dr Jarold Song requesting that the patient be informed that he has mild obstructive sleep apnea and a CPAP will be ordered.  Call placed to the patient and his niece, Dolores Hoose answered and stated that he is sleeping as he just returned from the ED.  She stated that she would have him return the call to [facility] and ask for Opal Sidles.

## 2015-12-08 NOTE — Telephone Encounter (Signed)
Call received from the patient and his niece; but spanish interpreter needed.    Call then placed to the patient # [phone] with the assistance of spanish speaking interpreter # [phone] with [facility] [employer]. Informed that patient of his diagnosis of mild sleep apnea and that Dr Jarold Song would be ordering a CPAP. He stated that he was agreeable and understood. He then noted that he is still " not breathing well" and he just returned from the ED.  Explained to him that he may need to return to the ED if he is having difficulty breathing and he stated that he understood. He also requested a follow up appointment @ [facility] and an appointment was scheduled with Dr Jarold Song for 12/09/15 @ 1500. He was appreciative of the appointment and did not have any further questions/concerns. Reminded him again to return to the ED if he continues to have any trouble breathing or has concerns about his breathing. Marland Kitchen

## 2015-12-08 NOTE — ED Notes (Signed)
Ambulated pt in hallway, pt's O2 remained at 100-99%. No complaints. Pt ambulated to restroom as well.

## 2015-12-09 ENCOUNTER — Ambulatory Visit: Payer: Self-pay | Attending: Family Medicine | Admitting: Family Medicine

## 2015-12-09 ENCOUNTER — Encounter: Payer: Self-pay | Admitting: Family Medicine

## 2015-12-09 VITALS — BP 148/68 | HR 74 | Temp 98.2°F | Resp 16 | Ht 63.0 in | Wt 195.4 lb

## 2015-12-09 DIAGNOSIS — E785 Hyperlipidemia, unspecified: Secondary | ICD-10-CM

## 2015-12-09 DIAGNOSIS — I129 Hypertensive chronic kidney disease with stage 1 through stage 4 chronic kidney disease, or unspecified chronic kidney disease: Secondary | ICD-10-CM | POA: Insufficient documentation

## 2015-12-09 DIAGNOSIS — E1122 Type 2 diabetes mellitus with diabetic chronic kidney disease: Secondary | ICD-10-CM

## 2015-12-09 DIAGNOSIS — G4733 Obstructive sleep apnea (adult) (pediatric): Secondary | ICD-10-CM

## 2015-12-09 DIAGNOSIS — Z79899 Other long term (current) drug therapy: Secondary | ICD-10-CM | POA: Insufficient documentation

## 2015-12-09 DIAGNOSIS — R600 Localized edema: Secondary | ICD-10-CM

## 2015-12-09 DIAGNOSIS — J342 Deviated nasal septum: Secondary | ICD-10-CM | POA: Insufficient documentation

## 2015-12-09 DIAGNOSIS — E11319 Type 2 diabetes mellitus with unspecified diabetic retinopathy without macular edema: Secondary | ICD-10-CM

## 2015-12-09 DIAGNOSIS — R0601 Orthopnea: Secondary | ICD-10-CM

## 2015-12-09 DIAGNOSIS — I10 Essential (primary) hypertension: Secondary | ICD-10-CM

## 2015-12-09 DIAGNOSIS — N184 Chronic kidney disease, stage 4 (severe): Secondary | ICD-10-CM

## 2015-12-09 LAB — HEMOGLOBIN A1C
HEMOGLOBIN A1C: 7.7 % — AB (ref <5.7)
Mean Plasma Glucose: 174 mg/dL

## 2015-12-09 LAB — GLUCOSE, POCT (MANUAL RESULT ENTRY): POC GLUCOSE: 90 mg/dL (ref 70–99)

## 2015-12-09 MED ORDER — ALBUTEROL SULFATE HFA 108 (90 BASE) MCG/ACT IN AERS: 2.0000 | INHALATION_SPRAY | Freq: Four times a day (QID) | RESPIRATORY_TRACT | Status: DC | PRN

## 2015-12-09 MED ORDER — HYDRALAZINE HCL 50 MG PO TABS: 50.0000 mg | ORAL_TABLET | Freq: Two times a day (BID) | ORAL | Status: DC

## 2015-12-09 MED ORDER — CARVEDILOL 25 MG PO TABS: ORAL_TABLET | ORAL | Status: DC

## 2015-12-09 MED ORDER — PRAVASTATIN SODIUM 40 MG PO TABS: ORAL_TABLET | ORAL | Status: DC

## 2015-12-09 MED ORDER — FUROSEMIDE 80 MG PO TABS: 80.0000 mg | ORAL_TABLET | Freq: Two times a day (BID) | ORAL | Status: DC

## 2015-12-09 MED ORDER — GLIPIZIDE 5 MG PO TABS: ORAL_TABLET | ORAL | Status: DC

## 2015-12-09 NOTE — Progress Notes (Signed)
Subjective:    Patient ID: Eric Glenn, male    DOB: October 31, 1965, 50 y.o.   MRN: [id number]  HPI He is a 50 year old man with a history of type 2 diabetes mellitus (A1c 7.6), hypertension, stage IV chronic kidney disease, hyperlipidemia, obstructive sleep apnea who comes into the clinic for follow-up visit.  He was recently seen at [facility] emergency room for orthopnea for which he received IV Lasix which he informs me did not help his symptoms. He sleeps sitting up due to orthopnea and he complains of worsening pedal edema despite compliance with 80 mg of Lasix twice daily. 2-D echo from 07/2015 reveals EF of 99991111, normal systolic function, no regional wall motion abnormalities, mild LVH.  He is scheduled to see ENT tomorrow due to the presence of a deviated nasal septum on his CT maxillofacial. He states he has difficulty getting air into his nostrils and struggling to do so results in chest pains; this is sometimes accompanied by intermittent nasal bleed and symptoms have not resolved with use of saline spray or Claritin. He did have a split night sleep study which revealed mild obstructive sleep apnea.  He has a right AV fistula and is scheduled to see nephrology next week  Past Medical History  Diagnosis Date  . Diabetes mellitus without complication ([place])   . Hypertension   . High cholesterol   . Chronic kidney disease (CKD), stage IV (severe) ([place])   . Diabetic retinopathy ([place])   . Anemia     Past Surgical History  Procedure Laterality Date  . Umbilical hernia repair    . Av fistula placement Right 09/04/2015    Procedure: ARTERIOVENOUS (AV) FISTULA CREATION;  Surgeon: Angelia Mould, MD;  Location: [place];  Service: Vascular;  Laterality: Right;    Allergies  Allergen Reactions  . Pork-Derived Products Rash and Other (See Comments)    Raises blood pressure    Current Outpatient Prescriptions on File Prior to Visit  Medication Sig Dispense Refill    . Blood Glucose Monitoring Suppl (TRUE METRIX METER) DEVI 1 each by Does not apply route 3 (three) times daily before meals. 1 Device 0  . glucose blood (TRUE METRIX BLOOD GLUCOSE TEST) test strip Use three times daily before meals 100 each 12  . Omega-3 Fatty Acids (FISH OIL) 1200 MG CAPS Take 1,200 mg by mouth daily.    . TRUEPLUS LANCETS 28G MISC 1 each by Does not apply route 3 (three) times daily before meals. 100 each 12  . lactulose (CHRONULAC) 10 GM/15ML solution Take 15 mLs (10 g total) by mouth 2 (two) times daily as needed for mild constipation. (Patient not taking: Reported on 12/08/2015) 946 mL 2  . omega-3 acid ethyl esters (LOVAZA) 1 g capsule Take 1 capsule (1 g total) by mouth 2 (two) times daily. (Patient not taking: Reported on 12/08/2015) 60 capsule 1   No current facility-administered medications on file prior to visit.      Review of Systems Constitutional: Negative for activity change and appetite change.  HENT: Negative for sinus pressure and sore throat.   Eyes: Negative for visual disturbance.  Respiratory: Positive for cough and shortness of breath. Negative for chest tightness.   Cardiovascular: Positive for leg swelling. Positive for orthopnea and intermittent for chest pain.  Gastrointestinal: Negative for abdominal pain, diarrhea, constipation and abdominal distention.  Endocrine: Negative.   Genitourinary: Negative for dysuria.  Musculoskeletal: Negative for myalgias and joint swelling.  Skin: Negative for rash.  Allergic/Immunologic: Negative.   Neurological: Negative for weakness, light-headedness and numbness.  Psychiatric/Behavioral: Negative for suicidal ideas and dysphoric mood.      Objective: Filed Vitals:   12/09/15 1524  BP: 148/68  Pulse: 74  Temp: 98.2 F (36.8 C)  TempSrc: Oral  Resp: 16  Height: 5\' 3"  (1.6 m)  Weight: 195 lb 6.4 oz (88.633 kg)  SpO2: 97%      Physical Exam  Constitutional: He is oriented to person, place, and  time. He appears well-developed and well-nourished.  Cardiovascular: Normal rate and normal heart sounds.   No murmur heard. Difficult to palpate dorsalis pedis pulses due to pedal edema  Pulmonary/Chest: Effort normal and breath sounds normal. He has no wheezes. He has no rales. He exhibits no tenderness.  Abdominal: Soft. Bowel sounds are normal. He exhibits no distension and no mass. There is no tenderness.  Musculoskeletal: Normal range of motion. He exhibits edema (3+nonpitting bilateral pedal edema up to the lower leg).  Right radial AV fistula with palpable thrill  Neurological: He is alert and oriented to person, place, and time.  Skin: Skin is warm and dry.          Assessment & Plan:  1. Type 2 diabetes mellitus with stage 4 chronic kidney disease, without long-term current use of insulin (HCC) A1c 7.6 A1c today, lipid panel at next visit when he is fasting Glycemic control will be less strict given history of CKD Continue current medications - Glucose (CBG)  2. Chronic kidney disease, stage IV (severe) (HCC) Status post AV fistula placement Renal function has been worsening especially with the patient being on Lasix Has an upcoming appointment with [place] [facility]  3. Pedal edema  2-D echo from 07/2015 revealed EF of 50-55% Refilled Lasix which he has been out of given worsening pedal edema and dyspnea and reveals on exam. Discontinued amlodipine substituted with hydralazine   4. Hypertension Blood pressure is slightly elevated above goal of less than 140/90 Advised on low-sodium diet, DASH diet and compliance with antihypertensives We will reassess blood pressure at next office visit for improvement  5. Suspected OSA  Orthopnea could be secondary to sleep apnea symptoms given he is not responding to usual doses of Lasix Split night study revealed mild obstructive sleep apnea CPAP titration ordered as per recommendations from sleep study report.  6. Deviated  nasal septum With underlying chronic sinusitis not responding to antihistamines and nasal spray Scheduled to see ENT tomorrow

## 2015-12-09 NOTE — Patient Instructions (Signed)
Alimentacin bsica para la enfermedad renal crnica Event organiser for Chronic Kidney Disease) Cuando los riones no funcionan bien, no logran eliminar los residuos y los excesos de sustancias de la sangre de la misma manera que antes. Esto puede producir Music therapist y un desequilibrio de estas sustancias, que pueden afectar las funciones corporales. Dicha acumulacin tambin hace que los riones se esfuercen ms y produce incluso mayor dao. Tal vez necesite comer una menor cantidad de ciertos alimentos que producen la acumulacin de estas sustancias en el cuerpo. Si hace los cambios en la dieta recomendados por su mdico o nutricionista, podra lograr prevenir un mayor dao renal y Lexicographer o prevenir la necesidad de dilisis. A continuacin encontrar informacin bsica sobre estas sustancias y cmo afectan sus funciones corporales. En la informacin tambin encontrar ejemplos de alimentos que contienen cantidades muy altas de estas sustancias. QU NECESITO SABER SOBRE LAS SUSTANCIAS QUE TAL VEZ DEBO AJUSTAR EN MI [place]? Los ajustes en la alimentacin pueden ser diferentes para cada persona con enfermedad renal crnica. Es importante que consulte a un nutricionista que lo ayude a Associate Professor ajustes especficos que deber hacer para cada una de las siguientes sustancias: Potasio El potasio afecta el ritmo cardaco. Si se acumula mucho potasio en la [place], [place] producir latidos cardacos irregulares o incluso un infarto de miocardio. Estos son algunos alimentos ricos en potasio:  Leche.  Lambert Mody.  Verduras. Fsforo El fsforo es un mineral que se encuentra en los [place]. Es necesario que el calcio y el fsforo estn equilibrados para Actor y Insurance account manager sanos. Una cantidad muy alta de fsforo elimina el calcio de los [place]. Esto har que sus huesos se debiliten y tengan ms riesgo de romperse. Mucha cantidad de fsforo tambin le causar picazn en la piel. Estos son  algunos alimentos ricos en fsforo:  Leche y [place].  Frijoles secos.  Guisantes.  Bebidas cola.  Nueces y [place] de man. [place] protenas animales lo ayudan a Actor y [employer]. Tambin ayudan a reparar las clulas y los tejidos del organismo. Uno de los productos naturales de la degradacin de las protenas es un residuo llamado urea. Cuando los riones no funcionan adecuadamente, no Medical sales representative los residuos como la urea de la misma manera que antes de tener enfermedad renal crnica. Es posible que deba limitar el consumo de protenas para evitar la acumulacin de urea en la [place]. Estos son algunos ejemplos de protenas animales:  Carne (todo tipo).  Pescado y [employer].  Aves.  Huevos. Sodio El [place], que est presente en la sal, ayuda a Theatre manager un equilibrio saludable de los fluidos corporales. Mucha cantidad de sodio puede aumentar la presin arterial y Best boy un efecto negativo en la funcin cardaca y pulmonar. Mucha cantidad de sodio puede hacer que el cuerpo retenga lquidos y [place] riones se esfuercen ms. Estos son algunos alimentos con niveles altos de sodio:  Condimentos salados.  Salsa de soja.  Carnes curadas y procesadas.  Galletas saladas y bocadillos.  Comida rpida.  Sopas enlatadas y Media planner de los alimentos enlatados. Glucosa La glucosa proporciona energa al organismo. Si tiene diabetes mellitus no controlada adecuadamente, tendr [place] cantidad de glucosa en la sangre. Mucha cantidad de glucosa en la sangre puede empeorar el funcionamiento de los riones al daar los vasos sanguneos pequeos. Esto evita que los riones reciban un flujo sanguneo suficiente para funcionar. Si tiene diabetes mellitus y enfermedad renal crnica, es importante que mantenga el nivel de glucosa en  la sangre segn las recomendaciones de su [place]? Dado que necesita evitar  ciertos alimentos, tal vez no reciba todas las vitaminas y los minerales que obtendra normalmente de estos alimentos. Su mdico o nutricionista podra recomendarle que tome un suplemento para asegurarse de que reciba todas las vitaminas y los minerales que su cuerpo necesita.    Esta informacin no tiene Marine scientist el consejo del mdico. Asegrese de hacerle al mdico cualquier pregunta que tenga.   Document Released: 11/21/2008 Document Revised: 07/26/2014 Elsevier Interactive Patient Education [employer].

## 2015-12-09 NOTE — Progress Notes (Signed)
Patient's here for ED f/up orthopnea.  Patient rates chest pain 10/10 that's off and on described as achy pain and breathing from mouth makes it difficult to take breaths.  Lasix is not pulling off enough fluid.Patient reports that his feet are still swollen.  Patient requesting med refills.

## 2015-12-10 ENCOUNTER — Other Ambulatory Visit: Payer: Self-pay | Admitting: Family Medicine

## 2015-12-10 ENCOUNTER — Telehealth: Payer: Self-pay | Admitting: Family Medicine

## 2015-12-10 DIAGNOSIS — E1122 Type 2 diabetes mellitus with diabetic chronic kidney disease: Secondary | ICD-10-CM

## 2015-12-10 DIAGNOSIS — J342 Deviated nasal septum: Secondary | ICD-10-CM | POA: Insufficient documentation

## 2015-12-10 DIAGNOSIS — N184 Chronic kidney disease, stage 4 (severe): Principal | ICD-10-CM

## 2015-12-10 NOTE — Telephone Encounter (Signed)
[place] patient via [place] interpreter, ID # [id number]. Patient verified name and date of birth. Patient notified that his A1C is 7.7 which is elevated. Patient was advised to comply with diabetic diet and exercise. Patient notified per Dr.Amao, if his levels remain elevated with next blood work, he will need an increase in dose of his glipizide. Patient voiced understanding.

## 2015-12-10 NOTE — Telephone Encounter (Signed)
-----   Message from Arnoldo Morale, MD sent at 12/10/2015  4:36 PM EDT ----- A1c is 7.7 which is elevated; advised to comply with diabetic diet and exercise. If levels remain elevated with next blood work he will need an increase in dose of his glipizide.

## 2015-12-16 ENCOUNTER — Ambulatory Visit: Payer: Self-pay | Admitting: Pulmonary Disease

## 2015-12-17 ENCOUNTER — Ambulatory Visit (HOSPITAL_BASED_OUTPATIENT_CLINIC_OR_DEPARTMENT_OTHER): Payer: Self-pay

## 2015-12-26 MED FILL — hydrALAZINE HCL 50 MG TABS: 50 | 30 days supply | Qty: 60 | Fill #0

## 2015-12-26 MED FILL — ?GLIPIZIDE 5MG TABLET: 5 | 30 days supply | Qty: 60 | Fill #0

## 2015-12-29 ENCOUNTER — Encounter: Payer: Self-pay | Admitting: Vascular Surgery

## 2015-12-31 ENCOUNTER — Ambulatory Visit: Payer: Self-pay | Admitting: Pulmonary Disease

## 2016-01-01 ENCOUNTER — Other Ambulatory Visit: Payer: Self-pay | Admitting: *Deleted

## 2016-01-01 DIAGNOSIS — Z4931 Encounter for adequacy testing for hemodialysis: Secondary | ICD-10-CM

## 2016-01-01 DIAGNOSIS — N186 End stage renal disease: Secondary | ICD-10-CM

## 2016-01-02 MED FILL — FUROSEMIDE 80 MG TABLET: 80 | 30 days supply | Qty: 60 | Fill #1

## 2016-01-05 ENCOUNTER — Encounter (HOSPITAL_BASED_OUTPATIENT_CLINIC_OR_DEPARTMENT_OTHER): Payer: Self-pay

## 2016-01-07 ENCOUNTER — Ambulatory Visit (INDEPENDENT_AMBULATORY_CARE_PROVIDER_SITE_OTHER): Payer: Self-pay | Admitting: Vascular Surgery

## 2016-01-07 ENCOUNTER — Ambulatory Visit (HOSPITAL_COMMUNITY)
Admission: RE | Admit: 2016-01-07 | Discharge: 2016-01-07 | Disposition: A | Discharge status: Home / Self Care | Payer: Self-pay | Source: Ambulatory Visit | Attending: Vascular Surgery | Admitting: Vascular Surgery

## 2016-01-07 ENCOUNTER — Encounter: Payer: Self-pay | Admitting: Vascular Surgery

## 2016-01-07 ENCOUNTER — Encounter: Payer: Self-pay | Admitting: *Deleted

## 2016-01-07 ENCOUNTER — Other Ambulatory Visit: Payer: Self-pay | Admitting: *Deleted

## 2016-01-07 VITALS — BP 186/87 | HR 67 | Ht 63.0 in | Wt 199.8 lb

## 2016-01-07 DIAGNOSIS — N186 End stage renal disease: Secondary | ICD-10-CM | POA: Insufficient documentation

## 2016-01-07 DIAGNOSIS — N185 Chronic kidney disease, stage 5: Secondary | ICD-10-CM

## 2016-01-07 DIAGNOSIS — E11319 Type 2 diabetes mellitus with unspecified diabetic retinopathy without macular edema: Secondary | ICD-10-CM | POA: Insufficient documentation

## 2016-01-07 DIAGNOSIS — E1122 Type 2 diabetes mellitus with diabetic chronic kidney disease: Secondary | ICD-10-CM | POA: Insufficient documentation

## 2016-01-07 DIAGNOSIS — Z4931 Encounter for adequacy testing for hemodialysis: Secondary | ICD-10-CM | POA: Insufficient documentation

## 2016-01-07 DIAGNOSIS — I120 Hypertensive chronic kidney disease with stage 5 chronic kidney disease or end stage renal disease: Secondary | ICD-10-CM | POA: Insufficient documentation

## 2016-01-07 DIAGNOSIS — E7800 Pure hypercholesterolemia, unspecified: Secondary | ICD-10-CM | POA: Insufficient documentation

## 2016-01-07 NOTE — Progress Notes (Addendum)
HISTORY AND PHYSICAL     CC:  Follow up for right RC AVF Referring Provider:  Arnoldo Morale, MD  HPI: This is a 50 y.o. male who underwent right RC AVF on 09/04/15 by Dr. Scot Dock.  He was seen in April and his fistula was not quite maturing.  He was complaining of some right thumb numbness, but no pain in his hand.  Today, he states he has numbness in the other fingers as well, but this has been present since surgery and has not changed.  He continues not to have any hand pain.  He states that when he makes a fist, he has a little pain halfway up the mid section of his arm.  His family member states that his hand seems to be sensitive that when you brush against his hand, he jumps.  He states it feels like it is burning on the skin when out in the sun.  He presents today to check on the maturation of the fistula.    He is diabetic and takes Glipizide.  He does have diabetic retinopathy.   He is on a beta blocker for blood pressure control.  He is on a statin for cholesterol management.    Past Medical History  Diagnosis Date  . Diabetes mellitus without complication ([place])   . Hypertension   . High cholesterol   . Chronic kidney disease (CKD), stage IV (severe) ([place])   . Diabetic retinopathy ([place])   . Anemia     Past Surgical History  Procedure Laterality Date  . Umbilical hernia repair    . Av fistula placement Right 09/04/2015    Procedure: ARTERIOVENOUS (AV) FISTULA CREATION;  Surgeon: Angelia Mould, MD;  Location: [place];  Service: Vascular;  Laterality: Right;    Allergies  Allergen Reactions  . Poractant Alfa Other (See Comments) and Rash    Raises blood pressure  . Pork-Derived Products Rash and Other (See Comments)    Raises blood pressure    Current Outpatient Prescriptions  Medication Sig Dispense Refill  . albuterol (PROVENTIL HFA;VENTOLIN HFA) 108 (90 Base) MCG/ACT inhaler Inhale 2 puffs into the lungs every 6 (six) hours as needed for wheezing or shortness of  breath. 1 Inhaler 6  . Blood Glucose Monitoring Suppl (TRUE METRIX METER) DEVI 1 each by Does not apply route 3 (three) times daily before meals. 1 Device 0  . carvedilol (COREG) 25 MG tablet TAKE 1 TABLET BY MOUTH TWICE DAILY WITH A MEAL 60 tablet 3  . furosemide (LASIX) 80 MG tablet Take 1 tablet (80 mg total) by mouth 2 (two) times daily. 60 tablet 1  . glipiZIDE (GLUCOTROL) 5 MG tablet TAKE 1 TABLET BY MOUTH 2 TIMES DAILY BEFORE A MEAL. 60 tablet 3  . glucose blood (TRUE METRIX BLOOD GLUCOSE TEST) test strip Use three times daily before meals 100 each 12  . hydrALAZINE (APRESOLINE) 50 MG tablet Take 1 tablet (50 mg total) by mouth 2 (two) times daily. 60 tablet 3  . lactulose (CHRONULAC) 10 GM/15ML solution Take 15 mLs (10 g total) by mouth 2 (two) times daily as needed for mild constipation. (Patient not taking: Reported on 12/08/2015) 946 mL 2  . omega-3 acid ethyl esters (LOVAZA) 1 g capsule Take 1 capsule (1 g total) by mouth 2 (two) times daily. (Patient not taking: Reported on 12/08/2015) 60 capsule 1  . Omega-3 Fatty Acids (FISH OIL) 1200 MG CAPS Take 1,200 mg by mouth daily.    . pravastatin (  PRAVACHOL) 40 MG tablet TAKE 1 TABLET BY MOUTH DAILY AT 6 PM. 30 tablet 3  . TRUEPLUS LANCETS 28G MISC 1 each by Does not apply route 3 (three) times daily before meals. 100 each 12   No current facility-administered medications for this visit.    Family History  Problem Relation Age of Onset  . Hypertension Mother   . Hyperlipidemia Mother   . Diabetes Mellitus II Sister     Social History   Social History  . Marital Status: Single    Spouse Name: N/A  . Number of Children: N/A  . Years of Education: N/A   Occupational History  . Not on file.   Social History Main Topics  . Smoking status: Former Smoker    Quit date: 10/22/2003  . Smokeless tobacco: Never Used  . Alcohol Use: No  . Drug Use: No  . Sexual Activity: Not on file   Other Topics Concern  . Not on file   Social  History Narrative     REVIEW OF SYSTEMS:  See HPI  PHYSICAL EXAMINATION:  Filed Vitals:   01/07/16 1413 01/07/16 1414  BP: 194/89 186/87  Pulse: 67    Body mass index is 35.4 kg/(m^2).  General:  WDWN in NAD; vital signs documented above Gait: Normal HENT: WNL, normocephalic Pulmonary: normal non-labored breathing , without Rales, rhonchi,  wheezing Cardiac: regular HR, without  Murmurs Skin:well healed scar right wrist Vascular Exam/Pulses: +thrill/bruit within fistula; easily palpable but small Extremities: without ischemic changes, without Gangrene , without cellulitis; without open wounds; sensation/motor is in tact right hand Musculoskeletal: no muscle wasting or atrophy  Neurologic: A&O X 3;  No focal weakness or paresthesias are detected Psychiatric:  The pt has Normal affect.   Non-Invasive Vascular Imaging:   Dialysis Fistula Duplex Evaluation 01/07/16: Diameter (cm) Depth (cm) Outflow vein  0.50 0.45 Antecubital fossa  0.44 0.47 Prox forearm   0.44 0.35 Mid forearm  0.42 0.24 Distal forearm    Pt meds includes: Statin:  Yes.   Beta Blocker:  Yes.   Aspirin:  No. ACEI:  No. ARB:  No. Other Antiplatelet/Anticoagulant:  No.    ASSESSMENT/PLAN:: 50 y.o. male with CKD 5 who returns for evaluation of his right radial cephalic AVF on 99991111.   -pt presented for follow up in April and his fistula was not maturing adequately and he returns today for re-check.  His fistula really has matured any  Since his last visit.  On duplex he does have some competing branches.   -Dr. Scot Dock discussed with with pt of fistulogram and only using a very small amount of contrast to evaluate the fistula.   -these recommendations were discussed with the pt and family member via an interpreter and he understands and agrees to proceed.   Leontine Locket, PA-C [facility] [phone]  Clinic MD:  Pt seen and examined in conjunction with Dr. Scot Dock  I have  interviewed the patient and examined the patient. I agree with the findings by the PA. The forearm fistula has not enlarged adequately. Therefore I have recommended that we proceed with a fistulogram to see what options we might have to salvage his forearm fistula. He may require ligation of some competing branches. We'll also look for any areas of stenosis. If the fistula was not salvageable we can determine if he is a candidate for conversion to a brachiocephalic fistula in the upper arm. We we will use as little contrast as necessary for  the study.  Gae Gallop, MD [phone]

## 2016-01-08 MED FILL — ?PRAVASTATIN NA 40 MG TAB: 40 MG | 30 days supply | Qty: 30 | Fill #1

## 2016-01-08 MED FILL — CARVEDILOL 25 MG TABLET: 25 | 30 days supply | Qty: 60 | Fill #1

## 2016-01-12 ENCOUNTER — Encounter (HOSPITAL_COMMUNITY): Payer: Self-pay | Admitting: Vascular Surgery

## 2016-01-12 ENCOUNTER — Encounter (HOSPITAL_COMMUNITY)
Admission: RE | Disposition: A | Discharge status: Home / Self Care | Payer: Self-pay | Source: Ambulatory Visit | Attending: Vascular Surgery

## 2016-01-12 ENCOUNTER — Other Ambulatory Visit: Payer: Self-pay

## 2016-01-12 ENCOUNTER — Ambulatory Visit (HOSPITAL_COMMUNITY)
Admission: RE | Admit: 2016-01-12 | Discharge: 2016-01-12 | Disposition: A | Discharge status: Home / Self Care | Payer: MEDICAID | Source: Ambulatory Visit | Attending: Vascular Surgery | Admitting: Vascular Surgery

## 2016-01-12 DIAGNOSIS — Z87891 Personal history of nicotine dependence: Secondary | ICD-10-CM | POA: Insufficient documentation

## 2016-01-12 DIAGNOSIS — E11319 Type 2 diabetes mellitus with unspecified diabetic retinopathy without macular edema: Secondary | ICD-10-CM | POA: Insufficient documentation

## 2016-01-12 DIAGNOSIS — Z7951 Long term (current) use of inhaled steroids: Secondary | ICD-10-CM | POA: Insufficient documentation

## 2016-01-12 DIAGNOSIS — E7800 Pure hypercholesterolemia, unspecified: Secondary | ICD-10-CM | POA: Insufficient documentation

## 2016-01-12 DIAGNOSIS — N184 Chronic kidney disease, stage 4 (severe): Secondary | ICD-10-CM | POA: Diagnosis present

## 2016-01-12 DIAGNOSIS — Z7984 Long term (current) use of oral hypoglycemic drugs: Secondary | ICD-10-CM | POA: Insufficient documentation

## 2016-01-12 DIAGNOSIS — Z79899 Other long term (current) drug therapy: Secondary | ICD-10-CM | POA: Insufficient documentation

## 2016-01-12 DIAGNOSIS — T82898A Other specified complication of vascular prosthetic devices, implants and grafts, initial encounter: Secondary | ICD-10-CM

## 2016-01-12 DIAGNOSIS — I120 Hypertensive chronic kidney disease with stage 5 chronic kidney disease or end stage renal disease: Secondary | ICD-10-CM | POA: Insufficient documentation

## 2016-01-12 DIAGNOSIS — N185 Chronic kidney disease, stage 5: Secondary | ICD-10-CM | POA: Insufficient documentation

## 2016-01-12 DIAGNOSIS — E1122 Type 2 diabetes mellitus with diabetic chronic kidney disease: Secondary | ICD-10-CM | POA: Insufficient documentation

## 2016-01-12 DIAGNOSIS — Y832 Surgical operation with anastomosis, bypass or graft as the cause of abnormal reaction of the patient, or of later complication, without mention of misadventure at the time of the procedure: Secondary | ICD-10-CM | POA: Insufficient documentation

## 2016-01-12 HISTORY — PX: PERIPHERAL VASCULAR CATHETERIZATION: SHX172C

## 2016-01-12 LAB — POCT I-STAT, CHEM 8
BUN: 88 mg/dL — AB (ref 6–20)
CALCIUM ION: 1.15 mmol/L (ref 1.12–1.23)
Chloride: 102 mmol/L (ref 101–111)
Creatinine, Ser: 5.2 mg/dL — ABNORMAL HIGH (ref 0.61–1.24)
Glucose, Bld: 122 mg/dL — ABNORMAL HIGH (ref 65–99)
HCT: 28 % — ABNORMAL LOW (ref 39.0–52.0)
HEMOGLOBIN: 9.5 g/dL — AB (ref 13.0–17.0)
Potassium: 4.2 mmol/L (ref 3.5–5.1)
SODIUM: 140 mmol/L (ref 135–145)
TCO2: 24 mmol/L (ref 0–100)

## 2016-01-12 LAB — GLUCOSE, CAPILLARY: GLUCOSE-CAPILLARY: 137 mg/dL — AB (ref 65–99)

## 2016-01-12 SURGERY — A/V SHUNTOGRAM/FISTULAGRAM

## 2016-01-12 MED ORDER — LIDOCAINE HCL (PF) 1 % IJ SOLN
INTRAMUSCULAR | Status: AC
  Filled 2016-01-12: qty 30

## 2016-01-12 MED ORDER — IODIXANOL 320 MG/ML IV SOLN
INTRAVENOUS | Status: DC | PRN
  Administered 2016-01-12: 10 mL via INTRAVENOUS

## 2016-01-12 MED ORDER — SODIUM CHLORIDE 0.9 % IV SOLN
INTRAVENOUS | Status: DC
  Administered 2016-01-12: 07:00:00 via INTRAVENOUS

## 2016-01-12 MED ORDER — LIDOCAINE HCL (PF) 1 % IJ SOLN
INTRAMUSCULAR | Status: DC | PRN
  Administered 2016-01-12: 1 mL via INTRADERMAL
  Administered 2016-01-12: .5 mL via INTRADERMAL

## 2016-01-12 MED ORDER — HYDRALAZINE HCL 20 MG/ML IJ SOLN
INTRAMUSCULAR | Status: DC | PRN
  Administered 2016-01-12 (×2): 10 mg via INTRAVENOUS

## 2016-01-12 MED ORDER — HYDRALAZINE HCL 20 MG/ML IJ SOLN
INTRAMUSCULAR | Status: AC
  Filled 2016-01-12: qty 1

## 2016-01-12 MED ORDER — HEPARIN (PORCINE) IN NACL 2-0.9 UNIT/ML-% IJ SOLN
INTRAMUSCULAR | Status: AC
  Filled 2016-01-12: qty 500

## 2016-01-12 SURGICAL SUPPLY — 10 items
BAG SNAP BAND KOVER 36X36 (MISCELLANEOUS) ×3 IMPLANT
COVER DOME SNAP 22 D (MISCELLANEOUS) ×3 IMPLANT
COVER PRB 48X5XTLSCP FOLD TPE (BAG) ×1 IMPLANT
COVER PROBE 5X48 (BAG) ×2
KIT MICROINTRODUCER STIFF 5F (SHEATH) ×3 IMPLANT
PROTECTION STATION PRESSURIZED (MISCELLANEOUS) ×3
STATION PROTECTION PRESSURIZED (MISCELLANEOUS) ×1 IMPLANT
STOPCOCK MORSE 400PSI 3WAY (MISCELLANEOUS) ×3 IMPLANT
TRAY PV CATH (CUSTOM PROCEDURE TRAY) ×3 IMPLANT
TUBING CIL FLEX 10 FLL-RA (TUBING) ×3 IMPLANT

## 2016-01-12 NOTE — Interval H&P Note (Signed)
History and Physical Interval Note:  01/12/2016 7:26 AM  Donavan Burnet  has presented today for surgery, with the diagnosis of non maturing fistula  The various methods of treatment have been discussed with the patient and family. After consideration of risks, benefits and other options for treatment, the patient has consented to  Procedure(s): Fistulagram (N/A) as a surgical intervention .  The patient's history has been reviewed, patient examined, no change in status, stable for surgery.  I have reviewed the patient's chart and labs.  Questions were answered to the patient's satisfaction.     [name]

## 2016-01-12 NOTE — Op Note (Signed)
   [name]  MRN: [id number] DOB: 1966-02-02    DATE OF PROCEDURE: 01/12/2016  INDICATIONS: Eric Glenn is a 50 y.o. male who had aright radial cephalic fistula placed on 09/04/2015. The fistula has not enlarged adequately. He presents for fistulogram in order to determine if there are any ways to salvage his forearm fistula. His duplex scan on 01/07/2016  Showed that the diameters of the fistula ranged from 0.24-0.47 cm. There were elevated velocities in the proximal fistula.  PROCEDURE:  1. Ultrasound-guided access to the right radial cephalic AV fistula 2. Fistulogram right radiocephalic AV fistula  SURGEON: Judeth Cornfield. Scot Dock, MD, FACS  ANESTHESIA: local   EBL: minimal  TECHNIQUE: the patient was taken to the peripheral vascular lab and the right arm was prepped and draped in usual sterile fashion. Under ultrasound guidance,after the skin was anesthetized, the proximal fistula was cannulated with a micropuncture needle and a micropuncture sheath introduced over a wire. Half-strength contrast was used. A fistulogram was obtained evaluating the fistula from the point of cannulation to the mid upper arm. Next a blood pressure cuff was inflated and then a reflux shot obtained to evaluate the arterial anastomosis.  A total of 10 cc of contrast was used. The cannulation site was closed with a 4-0 Monocryl suture. No immediate complications were noted.  FINDINGS:  1. He proximal fistula is small but there is no focal area of stenosis. The vein is bifid in the upper forearm. There is also a competing branch in the lower part of the forearm. The basilic vein is widely patent. The upper arm cephalic vein is patent. There is no stenosis at the arterial anastomosis noted.  CLINICAL NOTE: Given the multiple problems identified with the fistula including a small vein proximally.  A bifid vein in the upper arm and additional competing branches I would recommend placement  of a new upper arm right brachiocephalic fistula. The patient is currently not on dialysis and this will be arranged in the near future.  [name], MD, FACS [facility]  DATE OF DICTATION:   01/12/2016

## 2016-01-12 NOTE — Discharge Instructions (Signed)
Fistulografa, [place] (Fistulogram, Care After) Siga estas instrucciones durante las prximas semanas. Estas indicaciones le proporcionan informacin general acerca de cmo deber cuidarse despus del procedimiento. El mdico tambin podr darle instrucciones ms especficas. El tratamiento ha sido planificado segn las prcticas mdicas actuales, pero en algunos casos pueden ocurrir problemas. Comunquese con el mdico si tiene algn problema o tiene dudas despus del procedimiento. QU ESPERAR DESPUS DEL PROCEDIMIENTO Despus del procedimiento, es normal tener:  Una pequea molestia en la zona donde se colocaron los catteres.  Un pequeo hematoma alrededor de la fstula.  Somnolencia y Programmer, applications. INSTRUCCIONES PARA EL CUIDADO EN EL HOGAR  Haga reposo en su casa, el da despus del procedimiento.  No conduzca ni opere maquinaria pesada mientras toma analgsicos.  Tome los medicamentos solamente como se lo haya indicado el mdico.  No tome baos de inmersin, no nade ni use el jacuzzi hasta que el mdico lo autorice. Puede ducharse 24horas despus del procedimiento o segn las indicaciones del mdico.  Hay muchas maneras distintas de cerrar y cubrir una incisin, como puntos, pegamento para la piel y tiras [place]. Siga todas las indicaciones del mdico respecto a lo siguiente:  Cuidados de la herida.  Cambiar y Press photographer el vendaje.  Quitar el cierre de la incisin.  Controle cuidadosamente la fstula de dilisis. SOLICITE ATENCIN MDICA SI:  Tiene secrecin, enrojecimiento, hinchazn o dolor en TEFL teacher de insercin del catter.  Tiene fiebre.  Tiene escalofros. SOLICITE ATENCIN MDICA DE INMEDIATO SI:  Se siente dbil.  Tiene problemas de equilibrio.  Tiene dificultad para mover los brazos o las piernas.  Tiene problemas visuales o para hablar.  Ya no puede sentir una vibracin o un zumbido cuando [employer] dedos sobre la fstula de dilisis.  La  extremidad que se [name] para el procedimiento:  Se hincha.  Duele.  Est fra.  Cambia de color, por ejemplo, se torna azulado o blanco plido.   Esta informacin no tiene Marine scientist el consejo del mdico. Asegrese de hacerle al mdico cualquier pregunta que tenga.   Document Released: 11/19/2013 Elsevier Interactive Patient Education [employer].

## 2016-01-12 NOTE — H&P (View-Only) (Signed)
HISTORY AND PHYSICAL     CC:  Follow up for right RC AVF Referring Provider:  Arnoldo Morale, MD  HPI: This is a 50 y.o. male who underwent right RC AVF on 09/04/15 by Dr. Scot Dock.  He was seen in April and his fistula was not quite maturing.  He was complaining of some right thumb numbness, but no pain in his hand.  Today, he states he has numbness in the other fingers as well, but this has been present since surgery and has not changed.  He continues not to have any hand pain.  He states that when he makes a fist, he has a little pain halfway up the mid section of his arm.  His family member states that his hand seems to be sensitive that when you brush against his hand, he jumps.  He states it feels like it is burning on the skin when out in the sun.  He presents today to check on the maturation of the fistula.    He is diabetic and takes Glipizide.  He does have diabetic retinopathy.   He is on a beta blocker for blood pressure control.  He is on a statin for cholesterol management.    Past Medical History  Diagnosis Date  . Diabetes mellitus without complication ([place])   . Hypertension   . High cholesterol   . Chronic kidney disease (CKD), stage IV (severe) ([place])   . Diabetic retinopathy ([place])   . Anemia     Past Surgical History  Procedure Laterality Date  . Umbilical hernia repair    . Av fistula placement Right 09/04/2015    Procedure: ARTERIOVENOUS (AV) FISTULA CREATION;  Surgeon: Angelia Mould, MD;  Location: [facility];  Service: Vascular;  Laterality: Right;    Allergies  Allergen Reactions  . Poractant Alfa Other (See Comments) and Rash    Raises blood pressure  . Pork-Derived Products Rash and Other (See Comments)    Raises blood pressure    Current Outpatient Prescriptions  Medication Sig Dispense Refill  . albuterol (PROVENTIL HFA;VENTOLIN HFA) 108 (90 Base) MCG/ACT inhaler Inhale 2 puffs into the lungs every 6 (six) hours as needed for wheezing or shortness of  breath. 1 Inhaler 6  . Blood Glucose Monitoring Suppl (TRUE METRIX METER) DEVI 1 each by Does not apply route 3 (three) times daily before meals. 1 Device 0  . carvedilol (COREG) 25 MG tablet TAKE 1 TABLET BY MOUTH TWICE DAILY WITH A MEAL 60 tablet 3  . furosemide (LASIX) 80 MG tablet Take 1 tablet (80 mg total) by mouth 2 (two) times daily. 60 tablet 1  . glipiZIDE (GLUCOTROL) 5 MG tablet TAKE 1 TABLET BY MOUTH 2 TIMES DAILY BEFORE A MEAL. 60 tablet 3  . glucose blood (TRUE METRIX BLOOD GLUCOSE TEST) test strip Use three times daily before meals 100 each 12  . hydrALAZINE (APRESOLINE) 50 MG tablet Take 1 tablet (50 mg total) by mouth 2 (two) times daily. 60 tablet 3  . lactulose (CHRONULAC) 10 GM/15ML solution Take 15 mLs (10 g total) by mouth 2 (two) times daily as needed for mild constipation. (Patient not taking: Reported on 12/08/2015) 946 mL 2  . omega-3 acid ethyl esters (LOVAZA) 1 g capsule Take 1 capsule (1 g total) by mouth 2 (two) times daily. (Patient not taking: Reported on 12/08/2015) 60 capsule 1  . Omega-3 Fatty Acids (FISH OIL) 1200 MG CAPS Take 1,200 mg by mouth daily.    . pravastatin (  PRAVACHOL) 40 MG tablet TAKE 1 TABLET BY MOUTH DAILY AT 6 PM. 30 tablet 3  . TRUEPLUS LANCETS 28G MISC 1 each by Does not apply route 3 (three) times daily before meals. 100 each 12   No current facility-administered medications for this visit.    Family History  Problem Relation Age of Onset  . Hypertension Mother   . Hyperlipidemia Mother   . Diabetes Mellitus II Sister     Social History   Social History  . Marital Status: Single    Spouse Name: N/A  . Number of Children: N/A  . Years of Education: N/A   Occupational History  . Not on file.   Social History Main Topics  . Smoking status: Former Smoker    Quit date: 10/22/2003  . Smokeless tobacco: Never Used  . Alcohol Use: No  . Drug Use: No  . Sexual Activity: Not on file   Other Topics Concern  . Not on file   Social  History Narrative     REVIEW OF SYSTEMS:  See HPI  PHYSICAL EXAMINATION:  Filed Vitals:   01/07/16 1413 01/07/16 1414  BP: 194/89 186/87  Pulse: 67    Body mass index is 35.4 kg/(m^2).  General:  WDWN in NAD; vital signs documented above Gait: Normal HENT: WNL, normocephalic Pulmonary: normal non-labored breathing , without Rales, rhonchi,  wheezing Cardiac: regular HR, without  Murmurs Skin:well healed scar right wrist Vascular Exam/Pulses: +thrill/bruit within fistula; easily palpable but small Extremities: without ischemic changes, without Gangrene , without cellulitis; without open wounds; sensation/motor is in tact right hand Musculoskeletal: no muscle wasting or atrophy  Neurologic: A&O X 3;  No focal weakness or paresthesias are detected Psychiatric:  The pt has Normal affect.   Non-Invasive Vascular Imaging:   Dialysis Fistula Duplex Evaluation 01/07/16: Diameter (cm) Depth (cm) Outflow vein  0.50 0.45 Antecubital fossa  0.44 0.47 Prox forearm   0.44 0.35 Mid forearm  0.42 0.24 Distal forearm    Pt meds includes: Statin:  Yes.   Beta Blocker:  Yes.   Aspirin:  No. ACEI:  No. ARB:  No. Other Antiplatelet/Anticoagulant:  No.    ASSESSMENT/PLAN:: 50 y.o. male with CKD 5 who returns for evaluation of his right radial cephalic AVF on 99991111.   -pt presented for follow up in April and his fistula was not maturing adequately and he returns today for re-check.  His fistula really has matured any  Since his last visit.  On duplex he does have some competing branches.   -Dr. Scot Dock discussed with with pt of fistulogram and only using a very small amount of contrast to evaluate the fistula.   -these recommendations were discussed with the pt and family member via an interpreter and he understands and agrees to proceed.   Leontine Locket, PA-C [facility] [phone]  Clinic MD:  Pt seen and examined in conjunction with Dr. Scot Dock  I have  interviewed the patient and examined the patient. I agree with the findings by the PA. The forearm fistula has not enlarged adequately. Therefore I have recommended that we proceed with a fistulogram to see what options we might have to salvage his forearm fistula. He may require ligation of some competing branches. We'll also look for any areas of stenosis. If the fistula was not salvageable we can determine if he is a candidate for conversion to a brachiocephalic fistula in the upper arm. We we will use as little contrast as necessary for  the study.  Gae Gallop, MD [phone]

## 2016-01-13 MED FILL — Heparin Sodium (Porcine) 2 Unit/ML in Sodium Chloride 0.9%: INTRAMUSCULAR | Qty: 500 | Status: AC

## 2016-01-21 ENCOUNTER — Encounter (HOSPITAL_COMMUNITY): Payer: Self-pay | Admitting: *Deleted

## 2016-01-21 MED ORDER — DEXTROSE 5 % IV SOLN
1.5000 g | INTRAVENOUS | Status: AC
  Administered 2016-01-22: 1.5 g via INTRAVENOUS

## 2016-01-21 NOTE — Progress Notes (Addendum)
Spoke with pt via [employer] [phone]. Pt denies cardiac history. States his chest will hurt sometimes when he takes a deep breath especially after exertion. Pt is diabetic, states he does not check his blood sugar at home. States it's usually around 110 when it's checked at the doctor's office. Last A1C was 7.7 on 12/09/15. Instructed pt not to take his Glipizide this evening and in the AM. He voiced understanding.

## 2016-01-22 ENCOUNTER — Ambulatory Visit (HOSPITAL_COMMUNITY): Payer: MEDICAID | Admitting: Anesthesiology

## 2016-01-22 ENCOUNTER — Encounter (HOSPITAL_COMMUNITY): Payer: Self-pay | Admitting: *Deleted

## 2016-01-22 ENCOUNTER — Ambulatory Visit (HOSPITAL_COMMUNITY)
Admission: RE | Admit: 2016-01-22 | Discharge: 2016-01-22 | Disposition: A | Discharge status: Home / Self Care | Payer: Self-pay | Source: Ambulatory Visit | Attending: Vascular Surgery | Admitting: Vascular Surgery

## 2016-01-22 ENCOUNTER — Other Ambulatory Visit: Payer: Self-pay | Admitting: *Deleted

## 2016-01-22 ENCOUNTER — Encounter (HOSPITAL_COMMUNITY)
Admission: RE | Disposition: A | Discharge status: Home / Self Care | Payer: Self-pay | Source: Ambulatory Visit | Attending: Vascular Surgery

## 2016-01-22 ENCOUNTER — Telehealth: Payer: Self-pay | Admitting: Vascular Surgery

## 2016-01-22 DIAGNOSIS — N184 Chronic kidney disease, stage 4 (severe): Secondary | ICD-10-CM

## 2016-01-22 DIAGNOSIS — Z7951 Long term (current) use of inhaled steroids: Secondary | ICD-10-CM | POA: Insufficient documentation

## 2016-01-22 DIAGNOSIS — E11319 Type 2 diabetes mellitus with unspecified diabetic retinopathy without macular edema: Secondary | ICD-10-CM | POA: Insufficient documentation

## 2016-01-22 DIAGNOSIS — E1122 Type 2 diabetes mellitus with diabetic chronic kidney disease: Secondary | ICD-10-CM | POA: Insufficient documentation

## 2016-01-22 DIAGNOSIS — Z79899 Other long term (current) drug therapy: Secondary | ICD-10-CM | POA: Insufficient documentation

## 2016-01-22 DIAGNOSIS — T82510A Breakdown (mechanical) of surgically created arteriovenous fistula, initial encounter: Secondary | ICD-10-CM | POA: Insufficient documentation

## 2016-01-22 DIAGNOSIS — I120 Hypertensive chronic kidney disease with stage 5 chronic kidney disease or end stage renal disease: Secondary | ICD-10-CM | POA: Insufficient documentation

## 2016-01-22 DIAGNOSIS — T82898A Other specified complication of vascular prosthetic devices, implants and grafts, initial encounter: Secondary | ICD-10-CM

## 2016-01-22 DIAGNOSIS — J45909 Unspecified asthma, uncomplicated: Secondary | ICD-10-CM | POA: Insufficient documentation

## 2016-01-22 DIAGNOSIS — Z4931 Encounter for adequacy testing for hemodialysis: Secondary | ICD-10-CM

## 2016-01-22 DIAGNOSIS — E7800 Pure hypercholesterolemia, unspecified: Secondary | ICD-10-CM | POA: Insufficient documentation

## 2016-01-22 DIAGNOSIS — N185 Chronic kidney disease, stage 5: Secondary | ICD-10-CM | POA: Insufficient documentation

## 2016-01-22 DIAGNOSIS — Z6835 Body mass index (BMI) 35.0-35.9, adult: Secondary | ICD-10-CM | POA: Insufficient documentation

## 2016-01-22 DIAGNOSIS — Y832 Surgical operation with anastomosis, bypass or graft as the cause of abnormal reaction of the patient, or of later complication, without mention of misadventure at the time of the procedure: Secondary | ICD-10-CM | POA: Insufficient documentation

## 2016-01-22 DIAGNOSIS — Z87891 Personal history of nicotine dependence: Secondary | ICD-10-CM | POA: Insufficient documentation

## 2016-01-22 DIAGNOSIS — Z7984 Long term (current) use of oral hypoglycemic drugs: Secondary | ICD-10-CM | POA: Insufficient documentation

## 2016-01-22 DIAGNOSIS — N186 End stage renal disease: Secondary | ICD-10-CM

## 2016-01-22 HISTORY — PX: AV FISTULA PLACEMENT: SHX1204

## 2016-01-22 HISTORY — PX: LIGATION OF ARTERIOVENOUS  FISTULA: SHX5948

## 2016-01-22 LAB — POCT I-STAT 4, (NA,K, GLUC, HGB,HCT)
GLUCOSE: 148 mg/dL — AB (ref 65–99)
HEMATOCRIT: 26 % — AB (ref 39.0–52.0)
HEMOGLOBIN: 8.8 g/dL — AB (ref 13.0–17.0)
Potassium: 4.5 mmol/L (ref 3.5–5.1)
Sodium: 141 mmol/L (ref 135–145)

## 2016-01-22 LAB — GLUCOSE, CAPILLARY
GLUCOSE-CAPILLARY: 172 mg/dL — AB (ref 65–99)
Glucose-Capillary: 162 mg/dL — ABNORMAL HIGH (ref 65–99)

## 2016-01-22 SURGERY — ARTERIOVENOUS (AV) FISTULA CREATION
Anesthesia: Monitor Anesthesia Care | Site: Arm Upper | Laterality: Right

## 2016-01-22 MED ORDER — LIDOCAINE HCL (PF) 1 % IJ SOLN
INTRAMUSCULAR | Status: DC | PRN
  Administered 2016-01-22: 30 mL

## 2016-01-22 MED ORDER — PHENYLEPHRINE HCL 10 MG/ML IJ SOLN
10.0000 mg | INTRAVENOUS | Status: DC | PRN
  Administered 2016-01-22: 40 ug/min via INTRAVENOUS

## 2016-01-22 MED ORDER — SODIUM CHLORIDE 0.9 % IV SOLN
INTRAVENOUS | Status: DC
  Administered 2016-01-22: 07:00:00 via INTRAVENOUS

## 2016-01-22 MED ORDER — 0.9 % SODIUM CHLORIDE (POUR BTL) OPTIME
TOPICAL | Status: DC | PRN
  Administered 2016-01-22: 1000 mL

## 2016-01-22 MED ORDER — FENTANYL CITRATE (PF) 100 MCG/2ML IJ SOLN
INTRAMUSCULAR | Status: DC | PRN
  Administered 2016-01-22 (×2): 25 ug via INTRAVENOUS

## 2016-01-22 MED ORDER — DIPHENHYDRAMINE HCL 50 MG/ML IJ SOLN
INTRAMUSCULAR | Status: DC | PRN
  Administered 2016-01-22: 12.5 mg via INTRAVENOUS

## 2016-01-22 MED ORDER — SODIUM CHLORIDE 0.9 % IV SOLN
INTRAVENOUS | Status: DC | PRN
  Administered 2016-01-22: 07:00:00

## 2016-01-22 MED ORDER — PHENYLEPHRINE 40 MCG/ML (10ML) SYRINGE FOR IV PUSH (FOR BLOOD PRESSURE SUPPORT)
PREFILLED_SYRINGE | INTRAVENOUS | Status: DC | PRN
  Administered 2016-01-22: 80 ug via INTRAVENOUS
  Administered 2016-01-22: 40 ug via INTRAVENOUS

## 2016-01-22 MED ORDER — FENTANYL CITRATE (PF) 250 MCG/5ML IJ SOLN
INTRAMUSCULAR | Status: AC
  Filled 2016-01-22: qty 5

## 2016-01-22 MED ORDER — DEXTROSE 5 % IV SOLN
INTRAVENOUS | Status: AC
  Filled 2016-01-22: qty 1.5

## 2016-01-22 MED ORDER — CHLORHEXIDINE GLUCONATE CLOTH 2 % EX PADS: 6.0000 | MEDICATED_PAD | Freq: Once | CUTANEOUS | Status: DC

## 2016-01-22 MED ORDER — OXYCODONE-ACETAMINOPHEN 5-325 MG PO TABS: 1.0000 | ORAL_TABLET | Freq: Four times a day (QID) | ORAL | Status: DC | PRN

## 2016-01-22 MED ORDER — PROTAMINE SULFATE 10 MG/ML IV SOLN
INTRAVENOUS | Status: DC | PRN
  Administered 2016-01-22: 40 mg via INTRAVENOUS
  Administered 2016-01-22: 10 mg via INTRAVENOUS

## 2016-01-22 MED ORDER — LIDOCAINE HCL (CARDIAC) 20 MG/ML IV SOLN
INTRAVENOUS | Status: DC | PRN
  Administered 2016-01-22: 40 mg via INTRATRACHEAL

## 2016-01-22 MED ORDER — EPHEDRINE SULFATE-NACL 50-0.9 MG/10ML-% IV SOSY
PREFILLED_SYRINGE | INTRAVENOUS | Status: DC | PRN
  Administered 2016-01-22: 5 mg via INTRAVENOUS

## 2016-01-22 MED ORDER — PROPOFOL 500 MG/50ML IV EMUL
INTRAVENOUS | Status: DC | PRN
  Administered 2016-01-22: 60 ug/kg/min via INTRAVENOUS

## 2016-01-22 MED ORDER — MIDAZOLAM HCL 2 MG/2ML IJ SOLN
INTRAMUSCULAR | Status: AC
  Filled 2016-01-22: qty 2

## 2016-01-22 MED ORDER — MIDAZOLAM HCL 5 MG/5ML IJ SOLN
INTRAMUSCULAR | Status: DC | PRN
  Administered 2016-01-22: 2 mg via INTRAVENOUS

## 2016-01-22 MED ORDER — LIDOCAINE HCL (PF) 1 % IJ SOLN
INTRAMUSCULAR | Status: AC
  Filled 2016-01-22: qty 30

## 2016-01-22 MED ORDER — HEPARIN SODIUM (PORCINE) 1000 UNIT/ML IJ SOLN
INTRAMUSCULAR | Status: DC | PRN
  Administered 2016-01-22: 8000 [IU] via INTRAVENOUS

## 2016-01-22 SURGICAL SUPPLY — 39 items
ARMBAND PINK RESTRICT EXTREMIT (MISCELLANEOUS) ×4 IMPLANT
CANISTER SUCTION 2500CC (MISCELLANEOUS) ×4 IMPLANT
CANNULA VESSEL 3MM 2 BLNT TIP (CANNULA) ×4 IMPLANT
CLIP TI MEDIUM 6 (CLIP) ×4 IMPLANT
CLIP TI WIDE RED SMALL 6 (CLIP) ×8 IMPLANT
COVER PROBE W GEL 5X96 (DRAPES) ×4 IMPLANT
DECANTER SPIKE VIAL GLASS SM (MISCELLANEOUS) ×4 IMPLANT
ELECT REM PT RETURN 9FT ADLT (ELECTROSURGICAL) ×4
ELECTRODE REM PT RTRN 9FT ADLT (ELECTROSURGICAL) ×2 IMPLANT
GLOVE BIO SURGEON STRL SZ 6.5 (GLOVE) ×9 IMPLANT
GLOVE BIO SURGEON STRL SZ7.5 (GLOVE) ×4 IMPLANT
GLOVE BIO SURGEON STRL SZ8 (GLOVE) ×4 IMPLANT
GLOVE BIO SURGEONS STRL SZ 6.5 (GLOVE) ×3
GLOVE BIOGEL PI IND STRL 7.0 (GLOVE) ×2 IMPLANT
GLOVE BIOGEL PI IND STRL 7.5 (GLOVE) ×2 IMPLANT
GLOVE BIOGEL PI IND STRL 8 (GLOVE) ×2 IMPLANT
GLOVE BIOGEL PI INDICATOR 7.0 (GLOVE) ×2
GLOVE BIOGEL PI INDICATOR 7.5 (GLOVE) ×2
GLOVE BIOGEL PI INDICATOR 8 (GLOVE) ×2
GLOVE ECLIPSE 6.5 STRL STRAW (GLOVE) ×4 IMPLANT
GLOVE SURG SS PI 7.0 STRL IVOR (GLOVE) ×4 IMPLANT
GLOVE SURG SS PI 7.5 STRL IVOR (GLOVE) ×4 IMPLANT
GOWN STRL REUS W/ TWL LRG LVL3 (GOWN DISPOSABLE) ×8 IMPLANT
GOWN STRL REUS W/TWL LRG LVL3 (GOWN DISPOSABLE) ×8
GOWN STRL REUS W/TWL XL LVL3 (GOWN DISPOSABLE) ×4 IMPLANT
KIT BASIN OR (CUSTOM PROCEDURE TRAY) ×4 IMPLANT
KIT ROOM TURNOVER OR (KITS) ×4 IMPLANT
LIQUID BAND (GAUZE/BANDAGES/DRESSINGS) ×8 IMPLANT
NS IRRIG 1000ML POUR BTL (IV SOLUTION) ×4 IMPLANT
PACK CV ACCESS (CUSTOM PROCEDURE TRAY) ×4 IMPLANT
PAD ARMBOARD 7.5X6 YLW CONV (MISCELLANEOUS) ×8 IMPLANT
SPONGE GAUZE 4X4 12PLY STER LF (GAUZE/BANDAGES/DRESSINGS) ×4 IMPLANT
SPONGE SURGIFOAM ABS GEL 100 (HEMOSTASIS) IMPLANT
SUT PROLENE 6 0 BV (SUTURE) ×4 IMPLANT
SUT VIC AB 3-0 SH 27 (SUTURE) ×4
SUT VIC AB 3-0 SH 27X BRD (SUTURE) ×4 IMPLANT
SUT VICRYL 4-0 PS2 18IN ABS (SUTURE) ×8 IMPLANT
UNDERPAD 30X30 INCONTINENT (UNDERPADS AND DIAPERS) ×4 IMPLANT
WATER STERILE IRR 1000ML POUR (IV SOLUTION) ×4 IMPLANT

## 2016-01-22 NOTE — Anesthesia Postprocedure Evaluation (Signed)
Anesthesia Post Note  Patient: Eric Glenn  Procedure(s) Performed: Procedure(s) (LRB): RIGHT UPPER ARM BRACHIOCEPHALIC ARTERIOVENOUS (AV) FISTULA CREATION (Right) LIGATION OF RIGHT FOREARM ARTERIOVENOUS  FISTULA (Right)  Patient location during evaluation: PACU Anesthesia Type: MAC Level of consciousness: awake and alert Pain management: pain level controlled Vital Signs Assessment: post-procedure vital signs reviewed and stable Respiratory status: spontaneous breathing, nonlabored ventilation, respiratory function stable and patient connected to nasal cannula oxygen Cardiovascular status: stable and blood pressure returned to baseline Anesthetic complications: no    Last Vitals:  Filed Vitals:   01/22/16 1127 01/22/16 1130  BP:  144/76  Pulse:  66  Temp: 36.7 C   Resp:  17    Last Pain: There were no vitals filed for this visit.               Zenaida Deed

## 2016-01-22 NOTE — Telephone Encounter (Signed)
-----   Message from Mena Goes, RN sent at 01/22/2016  9:30 AM EDT ----- Regarding: Schedule 6 week postop w/ dialysis duplex   ----- Message -----    From: Gabriel Earing, PA-C    Sent: 01/22/2016   9:24 AM      To: [facility] Charge Pool  S/p right BC AVF 01/22/16.  F/u with Dr. Scot Dock in 6 weeks with duplex.  thanks

## 2016-01-22 NOTE — H&P (View-Only) (Signed)
HISTORY AND PHYSICAL     CC:  Follow up for right RC AVF Referring Provider:  Arnoldo Morale, MD  HPI: This is a 50 y.o. male who underwent right RC AVF on 09/04/15 by Dr. Scot Dock.  He was seen in April and his fistula was not quite maturing.  He was complaining of some right thumb numbness, but no pain in his hand.  Today, he states he has numbness in the other fingers as well, but this has been present since surgery and has not changed.  He continues not to have any hand pain.  He states that when he makes a fist, he has a little pain halfway up the mid section of his arm.  His family member states that his hand seems to be sensitive that when you brush against his hand, he jumps.  He states it feels like it is burning on the skin when out in the sun.  He presents today to check on the maturation of the fistula.    He is diabetic and takes Glipizide.  He does have diabetic retinopathy.   He is on a beta blocker for blood pressure control.  He is on a statin for cholesterol management.    Past Medical History  Diagnosis Date  . Diabetes mellitus without complication ([place])   . Hypertension   . High cholesterol   . Chronic kidney disease (CKD), stage IV (severe) ([place])   . Diabetic retinopathy ([place])   . Anemia     Past Surgical History  Procedure Laterality Date  . Umbilical hernia repair    . Av fistula placement Right 09/04/2015    Procedure: ARTERIOVENOUS (AV) FISTULA CREATION;  Surgeon: Angelia Mould, MD;  Location: [facility];  Service: Vascular;  Laterality: Right;    Allergies  Allergen Reactions  . Poractant Alfa Other (See Comments) and Rash    Raises blood pressure  . Pork-Derived Products Rash and Other (See Comments)    Raises blood pressure    Current Outpatient Prescriptions  Medication Sig Dispense Refill  . albuterol (PROVENTIL HFA;VENTOLIN HFA) 108 (90 Base) MCG/ACT inhaler Inhale 2 puffs into the lungs every 6 (six) hours as needed for wheezing or shortness of  breath. 1 Inhaler 6  . Blood Glucose Monitoring Suppl (TRUE METRIX METER) DEVI 1 each by Does not apply route 3 (three) times daily before meals. 1 Device 0  . carvedilol (COREG) 25 MG tablet TAKE 1 TABLET BY MOUTH TWICE DAILY WITH A MEAL 60 tablet 3  . furosemide (LASIX) 80 MG tablet Take 1 tablet (80 mg total) by mouth 2 (two) times daily. 60 tablet 1  . glipiZIDE (GLUCOTROL) 5 MG tablet TAKE 1 TABLET BY MOUTH 2 TIMES DAILY BEFORE A MEAL. 60 tablet 3  . glucose blood (TRUE METRIX BLOOD GLUCOSE TEST) test strip Use three times daily before meals 100 each 12  . hydrALAZINE (APRESOLINE) 50 MG tablet Take 1 tablet (50 mg total) by mouth 2 (two) times daily. 60 tablet 3  . lactulose (CHRONULAC) 10 GM/15ML solution Take 15 mLs (10 g total) by mouth 2 (two) times daily as needed for mild constipation. (Patient not taking: Reported on 12/08/2015) 946 mL 2  . omega-3 acid ethyl esters (LOVAZA) 1 g capsule Take 1 capsule (1 g total) by mouth 2 (two) times daily. (Patient not taking: Reported on 12/08/2015) 60 capsule 1  . Omega-3 Fatty Acids (FISH OIL) 1200 MG CAPS Take 1,200 mg by mouth daily.    . pravastatin (  PRAVACHOL) 40 MG tablet TAKE 1 TABLET BY MOUTH DAILY AT 6 PM. 30 tablet 3  . TRUEPLUS LANCETS 28G MISC 1 each by Does not apply route 3 (three) times daily before meals. 100 each 12   No current facility-administered medications for this visit.    Family History  Problem Relation Age of Onset  . Hypertension Mother   . Hyperlipidemia Mother   . Diabetes Mellitus II Sister     Social History   Social History  . Marital Status: Single    Spouse Name: N/A  . Number of Children: N/A  . Years of Education: N/A   Occupational History  . Not on file.   Social History Main Topics  . Smoking status: Former Smoker    Quit date: 10/22/2003  . Smokeless tobacco: Never Used  . Alcohol Use: No  . Drug Use: No  . Sexual Activity: Not on file   Other Topics Concern  . Not on file   Social  History Narrative     REVIEW OF SYSTEMS:  See HPI  PHYSICAL EXAMINATION:  Filed Vitals:   01/07/16 1413 01/07/16 1414  BP: 194/89 186/87  Pulse: 67    Body mass index is 35.4 kg/(m^2).  General:  WDWN in NAD; vital signs documented above Gait: Normal HENT: WNL, normocephalic Pulmonary: normal non-labored breathing , without Rales, rhonchi,  wheezing Cardiac: regular HR, without  Murmurs Skin:well healed scar right wrist Vascular Exam/Pulses: +thrill/bruit within fistula; easily palpable but small Extremities: without ischemic changes, without Gangrene , without cellulitis; without open wounds; sensation/motor is in tact right hand Musculoskeletal: no muscle wasting or atrophy  Neurologic: A&O X 3;  No focal weakness or paresthesias are detected Psychiatric:  The pt has Normal affect.   Non-Invasive Vascular Imaging:   Dialysis Fistula Duplex Evaluation 01/07/16: Diameter (cm) Depth (cm) Outflow vein  0.50 0.45 Antecubital fossa  0.44 0.47 Prox forearm   0.44 0.35 Mid forearm  0.42 0.24 Distal forearm    Pt meds includes: Statin:  Yes.   Beta Blocker:  Yes.   Aspirin:  No. ACEI:  No. ARB:  No. Other Antiplatelet/Anticoagulant:  No.    ASSESSMENT/PLAN:: 50 y.o. male with CKD 5 who returns for evaluation of his right radial cephalic AVF on 99991111.   -pt presented for follow up in April and his fistula was not maturing adequately and he returns today for re-check.  His fistula really has matured any  Since his last visit.  On duplex he does have some competing branches.   -Dr. Scot Dock discussed with with pt of fistulogram and only using a very small amount of contrast to evaluate the fistula.   -these recommendations were discussed with the pt and family member via an interpreter and he understands and agrees to proceed.   Leontine Locket, PA-C [facility] [phone]  Clinic MD:  Pt seen and examined in conjunction with Dr. Scot Dock  I have  interviewed the patient and examined the patient. I agree with the findings by the PA. The forearm fistula has not enlarged adequately. Therefore I have recommended that we proceed with a fistulogram to see what options we might have to salvage his forearm fistula. He may require ligation of some competing branches. We'll also look for any areas of stenosis. If the fistula was not salvageable we can determine if he is a candidate for conversion to a brachiocephalic fistula in the upper arm. We we will use as little contrast as necessary for  the study.  Gae Gallop, MD [phone]

## 2016-01-22 NOTE — Anesthesia Preprocedure Evaluation (Signed)
Anesthesia Evaluation  Patient identified by MRN, date of birth, ID band Patient awake    Reviewed: Allergy & Precautions, NPO status , Patient's Chart, lab work & pertinent test results, reviewed documented beta blocker date and time   History of Anesthesia Complications Negative for: history of anesthetic complications  Airway Mallampati: II  TM Distance: >3 FB Neck ROM: Full    Dental  (+) Dental Advisory Given, Poor Dentition, Missing   Pulmonary asthma , sleep apnea (possible) , former smoker,    breath sounds clear to auscultation       Cardiovascular hypertension, Pt. on medications and Pt. on home beta blockers + DOE   Rhythm:Regular Rate:Normal  1/17 ECHO:  EF 50-55%, valves OK   Neuro/Psych negative neurological ROS     GI/Hepatic negative GI ROS, Neg liver ROS,   Endo/Other  diabetes, Oral Hypoglycemic AgentsMorbid obesity  Renal/GU ESRFRenal disease (creat 4.35, K+ 4.6)     Musculoskeletal   Abdominal (+) + obese,   Peds  Hematology Hb 8.3   Anesthesia Other Findings Single upper tooth, left incisor is noticeably lose.  Reproductive/Obstetrics                             Anesthesia Physical  Anesthesia Plan  ASA: III  Anesthesia Plan: MAC   Post-op Pain Management:    Induction: Intravenous  Airway Management Planned: Natural Airway and Simple Face Mask  Additional Equipment:   Intra-op Plan:   Post-operative Plan:   Informed Consent:   Dental advisory given  Plan Discussed with: CRNA and Surgeon  Anesthesia Plan Comments: (May need bivalrudin, last dose was 0.75mg /kg x1 (about 71mg ))        Anesthesia Quick Evaluation

## 2016-01-22 NOTE — Telephone Encounter (Signed)
Sched lab 8/15 at 4:00 and MD 8/16 at 9:45. Spoke to pt's niece to inform them of appt.

## 2016-01-22 NOTE — Transfer of Care (Signed)
Immediate Anesthesia Transfer of Care Note  Patient: Eric Glenn  Procedure(s) Performed: Procedure(s): RIGHT UPPER ARM BRACHIOCEPHALIC ARTERIOVENOUS (AV) FISTULA CREATION (Right) LIGATION OF RIGHT FOREARM ARTERIOVENOUS  FISTULA (Right)  Patient Location: PACU  Anesthesia Type:MAC  Level of Consciousness: awake, alert , oriented and patient cooperative  Airway & Oxygen Therapy: Patient Spontanous Breathing  Post-op Assessment: Report given to RN and Post -op Vital signs reviewed and stable  Post vital signs: Reviewed and stable  Last Vitals:  Filed Vitals:   01/22/16 0631 01/22/16 0633  BP: 225/89 215/88  Pulse: 75   Temp: 36.8 C   Resp: 18     Last Pain: There were no vitals filed for this visit.       Complications: No apparent anesthesia complications

## 2016-01-22 NOTE — Discharge Instructions (Signed)
° ° °  01/22/2016 Eric Glenn [id number] Sep 02, 1965  Surgeon(s): Angelia Mould, MD  Procedure(s): RIGHT UPPER ARM BRACHIOCEPHALIC ARTERIOVENOUS (AV) FISTULA CREATION LIGATION OF RIGHT FOREARM ARTERIOVENOUS  FISTULA  x Do not stick fistula for 12 weeks

## 2016-01-22 NOTE — Interval H&P Note (Signed)
History and Physical Interval Note:  01/22/2016 7:06 AM  Eric Glenn  has presented today for surgery, with the diagnosis of Stage IV Chronic Kidney Disease N18.4  The various methods of treatment have been discussed with the patient and family. After consideration of risks, benefits and other options for treatment, the patient has consented to  Procedure(s): BRACHIOCEPHALIC ARTERIOVENOUS (AV) FISTULA CREATION (Right) as a surgical intervention .  The patient's history has been reviewed, patient examined, no change in status, stable for surgery.  I have reviewed the patient's chart and labs.  Questions were answered to the patient's satisfaction.     [name]

## 2016-01-22 NOTE — Op Note (Signed)
    NAMEKhaaliq Glenn  MRN: [id number] DOB: 04/01/1966    DATE OF OPERATION: 01/22/2016  PREOP DIAGNOSIS: Stage IV chronic kidney disease  POSTOP DIAGNOSIS: same  PROCEDURE:  1. Right brachiocephalic AV fistula 2. Ligation of right radial cephalic AV fistula  SURGEON: Judeth Cornfield. Scot Dock, MD, FACS  ASSIST: Leontine Locket, PA  ANESTHESIA: local with sedation   EBL: inimal  INDICATIONS: [name] is a 50 y.o. male who had a right radiocephalic AV fistula which failed to mature adequately. There were no options to salvage this and I recommended conversion to a right brachiocephalic AV fistula.  FINDINGS: 4.5 mm vein. However, the vein was very far lateral and I had to mobilize the veindown into the forearm in order to have enough vein for anastomosis to the brachial artery medially.  TECHNIQUE: The patient was taken to the operative room and sedated by anesthesia. The right upper extremity was prepped and draped in usual sterile fashion. After the skin was anesthetized with 1% lidocainean incision was made over the cephalic vein just above the antecubital level and here the vein was dissected free.Several small branches were divided between clips and 3-0 silk ties. A separate longitudinal incision was made over the cephalic vein below the antecubital crease after the skin was anesthetized. The vein was mobilized here and then ligated distally. It irrigated nicely with heparinized saline.  After the skin was anesthetized with 1% lidocaine, a longitudinal incision was made over the brachial artery. The brachial artery was dissected free and was a good-sized artery. The patient was heparinized after a tunnel was created between this incision and the incision above the antecubital level overlying the cephalic vein. The vein was brought to the tunnel. The brachial artery was clamped proximally and distally and a longitudinal arteriotomy was made. The vein was sewn  end-to-side to the artery using continuous 6-0 Prolene suture. At the completion was a good thrill in the fistula.  Next after the skin was anesthetized, a small incision was made over the cephalic vein at the wrist where the vein was dissected free and ligated with a 2-0 silk tie. The heparin was partially reversed with protamine. This small incision was closed with a 4-0 subcuticular stitch.  The remaining 3 incisions were closed with a deeper 3-0 Vicryl and the skin closed with 4-0 Vicryl. A sterile dressing was applied. The patient tolerated the procedure well and was transferred to the recovery room in stable condition. All le and sponge counts were correct.  [name], MD, FACS [facility]  DATE OF DICTATION:   01/22/2016

## 2016-01-23 ENCOUNTER — Encounter (HOSPITAL_COMMUNITY): Payer: Self-pay | Admitting: Vascular Surgery

## 2016-01-23 MED FILL — glipiZIDE 5 MG TABS: 5 | 30 days supply | Qty: 60 | Fill #1

## 2016-01-23 MED FILL — hydrALAZINE HCL 50 MG TABS: 50 | 30 days supply | Qty: 60 | Fill #1

## 2016-02-03 MED FILL — ?PRAVASTATIN NA 40 MG TAB: 40 MG | 30 days supply | Qty: 30 | Fill #2

## 2016-02-03 MED FILL — FUROSEMIDE 80 MG TABLET: 80 | 30 days supply | Qty: 60 | Fill #0

## 2016-02-05 ENCOUNTER — Encounter: Payer: Self-pay | Admitting: Family Medicine

## 2016-02-05 ENCOUNTER — Ambulatory Visit: Payer: Self-pay | Attending: Family Medicine | Admitting: Family Medicine

## 2016-02-05 VITALS — BP 158/80 | HR 64 | Temp 97.7°F | Resp 16 | Ht 65.5 in | Wt 202.4 lb

## 2016-02-05 DIAGNOSIS — E785 Hyperlipidemia, unspecified: Secondary | ICD-10-CM | POA: Insufficient documentation

## 2016-02-05 DIAGNOSIS — E1122 Type 2 diabetes mellitus with diabetic chronic kidney disease: Secondary | ICD-10-CM | POA: Insufficient documentation

## 2016-02-05 DIAGNOSIS — Z79899 Other long term (current) drug therapy: Secondary | ICD-10-CM | POA: Insufficient documentation

## 2016-02-05 DIAGNOSIS — M79631 Pain in right forearm: Secondary | ICD-10-CM | POA: Insufficient documentation

## 2016-02-05 DIAGNOSIS — E11319 Type 2 diabetes mellitus with unspecified diabetic retinopathy without macular edema: Secondary | ICD-10-CM | POA: Insufficient documentation

## 2016-02-05 DIAGNOSIS — R600 Localized edema: Secondary | ICD-10-CM | POA: Insufficient documentation

## 2016-02-05 DIAGNOSIS — I10 Essential (primary) hypertension: Secondary | ICD-10-CM

## 2016-02-05 DIAGNOSIS — I129 Hypertensive chronic kidney disease with stage 1 through stage 4 chronic kidney disease, or unspecified chronic kidney disease: Secondary | ICD-10-CM | POA: Insufficient documentation

## 2016-02-05 DIAGNOSIS — T82848A Pain from vascular prosthetic devices, implants and grafts, initial encounter: Secondary | ICD-10-CM

## 2016-02-05 DIAGNOSIS — N184 Chronic kidney disease, stage 4 (severe): Secondary | ICD-10-CM | POA: Insufficient documentation

## 2016-02-05 LAB — LIPID PANEL
Cholesterol: 93 mg/dL — ABNORMAL LOW (ref 125–200)
HDL: 28 mg/dL — ABNORMAL LOW (ref >=40)
LDL CALC: 32 mg/dL (ref <130)
Total CHOL/HDL Ratio: 3.3 Ratio (ref <=5.0)
Triglycerides: 166 mg/dL — ABNORMAL HIGH (ref <150)
VLDL: 33 mg/dL — AB (ref <30)

## 2016-02-05 LAB — GLUCOSE, POCT (MANUAL RESULT ENTRY): POC Glucose: 154 mg/dl — AB (ref 70–99)

## 2016-02-05 MED ORDER — FUROSEMIDE 80 MG PO TABS: 80.0000 mg | ORAL_TABLET | Freq: Two times a day (BID) | ORAL | Status: DC

## 2016-02-05 MED ORDER — GLIPIZIDE 5 MG PO TABS: ORAL_TABLET | ORAL | Status: DC

## 2016-02-05 MED ORDER — HYDRALAZINE HCL 100 MG PO TABS: 100.0000 mg | ORAL_TABLET | Freq: Two times a day (BID) | ORAL | Status: DC

## 2016-02-05 MED ORDER — ACETAMINOPHEN-CODEINE #3 300-30 MG PO TABS: 1.0000 | ORAL_TABLET | Freq: Two times a day (BID) | ORAL | Status: DC | PRN

## 2016-02-05 MED ORDER — ATORVASTATIN CALCIUM 40 MG PO TABS: 40.0000 mg | ORAL_TABLET | Freq: Every day | ORAL | Status: DC

## 2016-02-05 MED ORDER — CARVEDILOL 25 MG PO TABS: ORAL_TABLET | ORAL | Status: DC

## 2016-02-05 NOTE — Patient Instructions (Signed)
Hypertension Hypertension, commonly called high blood pressure, is when the force of blood pumping through your arteries is too strong. Your arteries are the blood vessels that carry blood from your heart throughout your body. A blood pressure reading consists of a higher number over a lower number, such as 110/72. The higher number (systolic) is the pressure inside your arteries when your heart pumps. The lower number (diastolic) is the pressure inside your arteries when your heart relaxes. Ideally you want your blood pressure below 120/80. Hypertension forces your heart to work harder to pump blood. Your arteries may become narrow or stiff. Having untreated or uncontrolled hypertension can cause heart attack, stroke, kidney disease, and other problems. RISK FACTORS Some risk factors for high blood pressure are controllable. Others are not.  Risk factors you cannot control include:   Race. You may be at higher risk if you are African American.  Age. Risk increases with age.  Gender. Men are at higher risk than women before age 45 years. After age 65, women are at higher risk than men. Risk factors you can control include:  Not getting enough exercise or physical activity.  Being overweight.  Getting too much fat, sugar, calories, or salt in your diet.  Drinking too much alcohol. SIGNS AND SYMPTOMS Hypertension does not usually cause signs or symptoms. Extremely high blood pressure (hypertensive crisis) may cause headache, anxiety, shortness of breath, and nosebleed. DIAGNOSIS To check if you have hypertension, your health care provider will measure your blood pressure while you are seated, with your arm held at the level of your heart. It should be measured at least twice using the same arm. Certain conditions can cause a difference in blood pressure between your right and left arms. A blood pressure reading that is higher than normal on one occasion does not mean that you need treatment. If  it is not clear whether you have high blood pressure, you may be asked to return on a different day to have your blood pressure checked again. Or, you may be asked to monitor your blood pressure at home for 1 or more weeks. TREATMENT Treating high blood pressure includes making lifestyle changes and possibly taking medicine. Living a healthy lifestyle can help lower high blood pressure. You may need to change some of your habits. Lifestyle changes may include:  Following the DASH diet. This diet is high in fruits, vegetables, and whole grains. It is low in salt, red meat, and added sugars.  Keep your sodium intake below 2,300 mg per day.  Getting at least 30-45 minutes of aerobic exercise at least 4 times per week.  Losing weight if necessary.  Not smoking.  Limiting alcoholic beverages.  Learning ways to reduce stress. Your health care provider may prescribe medicine if lifestyle changes are not enough to get your blood pressure under control, and if one of the following is true:  You are 18-59 years of age and your systolic blood pressure is above 140.  You are 60 years of age or older, and your systolic blood pressure is above 150.  Your diastolic blood pressure is above 90.  You have diabetes, and your systolic blood pressure is over 140 or your diastolic blood pressure is over 90.  You have kidney disease and your blood pressure is above 140/90.  You have heart disease and your blood pressure is above 140/90. Your personal target blood pressure may vary depending on your medical conditions, your age, and other factors. HOME CARE INSTRUCTIONS    Have your blood pressure rechecked as directed by your health care provider.   Take medicines only as directed by your health care provider. Follow the directions carefully. Blood pressure medicines must be taken as prescribed. The medicine does not work as well when you skip doses. Skipping doses also puts you at risk for  problems.  Do not smoke.   Monitor your blood pressure at home as directed by your health care provider. SEEK MEDICAL CARE IF:   You think you are having a reaction to medicines taken.  You have recurrent headaches or feel dizzy.  You have swelling in your ankles.  You have trouble with your vision. SEEK IMMEDIATE MEDICAL CARE IF:  You develop a severe headache or confusion.  You have unusual weakness, numbness, or feel faint.  You have severe chest or abdominal pain.  You vomit repeatedly.  You have trouble breathing. MAKE SURE YOU:   Understand these instructions.  Will watch your condition.  Will get help right away if you are not doing well or get worse.   This information is not intended to replace advice given to you by your health care provider. Make sure you discuss any questions you have with your health care provider.   Document Released: 07/05/2005 Document Revised: 11/19/2014 Document Reviewed: 04/27/2013 Elsevier Interactive Patient Education 2016 Elsevier Inc.  

## 2016-02-05 NOTE — Progress Notes (Signed)
Pt here for hospital F/U. Pt reports pain in right hand that itches. Pt has taken medications today. Pt has not eaten today. CBG is 154

## 2016-02-05 NOTE — Progress Notes (Signed)
Subjective:  Patient ID: Eric Glenn, male    DOB: 10/18/1965  Age: 50 y.o. MRN: [id number]  CC: Follow-up   HPI Eric Glenn He is a 50 year old man with a history of type 2 diabetes mellitus (A1c 7.6), hypertension, stage IV chronic kidney disease, hyperlipidemia, obstructive sleep apnea who comes into the clinic for follow-up visit After right brachiocephalic AV fistula creation and ligation of right radial cephalic AV fistula on [id number] due to failure of the latter to mature.  He complains of pain in his right forearm and associated swelling and is unable to fully extend that arm; he is right-handed. Denies fever or erythema.  He is being followed by ENT at [facility] due to chronic sinusitis and deviated nasal septum and is scheduled for surgery but would need to obtain clearance due to his chronic kidney disease.  Compliant with antihypertensives and blood pressure is elevated today; he has been compliant with his diabetic medication but has not been checking his blood sugars.    Outpatient Prescriptions Prior to Visit  Medication Sig Dispense Refill  . albuterol (PROVENTIL HFA;VENTOLIN HFA) 108 (90 Base) MCG/ACT inhaler Inhale 2 puffs into the lungs every 6 (six) hours as needed for wheezing or shortness of breath. 1 Inhaler 6  . Blood Glucose Monitoring Suppl (TRUE METRIX METER) DEVI 1 each by Does not apply route 3 (three) times daily before meals. 1 Device 0  . fluticasone (FLONASE) 50 MCG/ACT nasal spray Place 1 spray into both nostrils daily.    Marland Kitchen glucose blood (TRUE METRIX BLOOD GLUCOSE TEST) test strip Use three times daily before meals 100 each 12  . Omega-3 Fatty Acids (FISH OIL) 1200 MG CAPS Take 1,200 mg by mouth daily.    . TRUEPLUS LANCETS 28G MISC 1 each by Does not apply route 3 (three) times daily before meals. 100 each 12  . carvedilol (COREG) 25 MG tablet TAKE 1 TABLET BY MOUTH TWICE DAILY WITH A MEAL 60 tablet 3  .  furosemide (LASIX) 80 MG tablet Take 1 tablet (80 mg total) by mouth 2 (two) times daily. 60 tablet 1  . glipiZIDE (GLUCOTROL) 5 MG tablet TAKE 1 TABLET BY MOUTH 2 TIMES DAILY BEFORE A MEAL. 60 tablet 3  . hydrALAZINE (APRESOLINE) 50 MG tablet Take 1 tablet (50 mg total) by mouth 2 (two) times daily. 60 tablet 3  . oxyCODONE-acetaminophen (PERCOCET) 5-325 MG tablet Take 1 tablet by mouth every 6 (six) hours as needed for severe pain. 8 tablet 0  . pravastatin (PRAVACHOL) 40 MG tablet TAKE 1 TABLET BY MOUTH DAILY AT 6 PM. 30 tablet 3   No facility-administered medications prior to visit.    ROS Review of Systems Constitutional: Negative for activity change and appetite change.  HENT: Negative for sinus pressure and sore throat.   Eyes: Negative for visual disturbance.  Respiratory: Negative for chest tightness, Negative for shortness of breath   Cardiovascular: Positive for leg swelling. Positive for orthopnea and intermittent for chest pain.  Gastrointestinal: Negative for abdominal pain, diarrhea, constipation and abdominal distention.  Endocrine: Negative.   Genitourinary: Negative for dysuria.  Musculoskeletal: See history of present illness Skin: Negative for rash.  Allergic/Immunologic: Negative.   Neurological: Negative for weakness, light-headedness and numbness.  Psychiatric/Behavioral: Negative for suicidal ideas and dysphoric mood.   Objective:  BP 158/80 mmHg  Pulse 64  Temp(Src) 97.7 F (36.5 C) (Oral)  Resp 16  Ht 5' 5.5" (1.664 m)  Wt 202 lb 6.4 oz (  91.808 kg)  BMI 33.16 kg/m2  SpO2 98%  BP/Weight 02/05/2016 01/22/2016 [id number]  Systolic BP 0000000 123456 [id number]  Diastolic BP 80 76 74  Wt. (Lbs) 202.4 199 199  BMI 33.16 35.26 35.26      Physical Exam Constitutional: He is oriented to person, place, and time. He appears well-developed and well-nourished.  Cardiovascular: Normal rate and normal heart sounds.   No murmur heard. Pulmonary/Chest: Effort normal and breath  sounds normal. He has no wheezes. He has no rales. He exhibits no tenderness.  Abdominal: Soft. Bowel sounds are normal. He exhibits no distension and no mass. There is no tenderness.  Musculoskeletal: Normal range of motion. He exhibits edema (1+nonpitting bilateral pedal edema ). Significant edema of right upper extremity including dorsum with slight tenderness on medial aspect  Right brachiocephalic AV fistula Neurological: He is alert and oriented to person, place, and time.  Skin: Skin is warm and dry.   Assessment & Plan:   1. Diabetic retinopathy without macular edema associated with type 2 diabetes mellitus, unspecified retinopathy severity (HCC) A1c of 7.7 Not fully optimized No regimen changes at this time as he does have chronic kidney disease and glycemic control will be less strict to prevent hypoglycemia. - Glucose (CBG)  2. Essential hypertension Uncontrolled Increase hydralazine from 50 mg to 100 mg twice daily - hydrALAZINE (APRESOLINE) 100 MG tablet; Take 1 tablet (100 mg total) by mouth 2 (two) times daily.  Dispense: 60 tablet; Refill: 3 - carvedilol (COREG) 25 MG tablet; TAKE 1 TABLET BY MOUTH TWICE DAILY WITH A MEAL  Dispense: 60 tablet; Refill: 3  3. Pedal edema Slightly improved - furosemide (LASIX) 80 MG tablet; Take 1 tablet (80 mg total) by mouth 2 (two) times daily.  Dispense: 60 tablet; Refill: 3  4. Controlled type 2 diabetes mellitus with stage 4 chronic kidney disease, without long-term current use of insulin (HCC) - glipiZIDE (GLUCOTROL) 5 MG tablet; TAKE 1 TABLET BY MOUTH 2 TIMES DAILY BEFORE A MEAL.  Dispense: 60 tablet; Refill: 3  5. Chronic kidney disease, stage IV (severe) ([place]) Closely followed by nephrology He will also need to obtain current from nephrology as requested by ENT for his nasal septum surgery.  6. Hyperlipidemia - Lipid panel Pravastatin switched to atorvastatin due to his increased cardiovascular risk - atorvastatin (LIPITOR)  40 MG tablet; Take 1 tablet (40 mg total) by mouth daily.  Dispense: 90 tablet; Refill: 3  7. Pain from A/V fistula [facility]) Patient advised to get in touch with his vascular surgeon's office to complain of edema and pain No evidence of cellulitis at this time. - acetaminophen-codeine (TYLENOL #3) 300-30 MG tablet; Take 1 tablet by mouth every 12 (twelve) hours as needed for moderate pain.  Dispense: 30 tablet; Refill: 0   Meds ordered this encounter  Medications  . hydrALAZINE (APRESOLINE) 100 MG tablet    Sig: Take 1 tablet (100 mg total) by mouth 2 (two) times daily.    Dispense:  60 tablet    Refill:  3    Discontinue previous dose  . atorvastatin (LIPITOR) 40 MG tablet    Sig: Take 1 tablet (40 mg total) by mouth daily.    Dispense:  90 tablet    Refill:  3  . carvedilol (COREG) 25 MG tablet    Sig: TAKE 1 TABLET BY MOUTH TWICE DAILY WITH A MEAL    Dispense:  60 tablet    Refill:  3  . furosemide (LASIX) 80 MG tablet  Sig: Take 1 tablet (80 mg total) by mouth 2 (two) times daily.    Dispense:  60 tablet    Refill:  3  . glipiZIDE (GLUCOTROL) 5 MG tablet    Sig: TAKE 1 TABLET BY MOUTH 2 TIMES DAILY BEFORE A MEAL.    Dispense:  60 tablet    Refill:  3  . acetaminophen-codeine (TYLENOL #3) 300-30 MG tablet    Sig: Take 1 tablet by mouth every 12 (twelve) hours as needed for moderate pain.    Dispense:  30 tablet    Refill:  0    Follow-up: Return in about 1 month (around 03/07/2016) for follow up on Hypertension.   Arnoldo Morale MD

## 2016-02-10 ENCOUNTER — Telehealth: Payer: Self-pay

## 2016-02-10 NOTE — Telephone Encounter (Signed)
Attempted to return call to pt's. Niece to discuss pt's symptoms.  Per voice message left on Triage line, the niece reported the pt. Has continued swelling and pain in right arm 3 weeks following his surgery.  Left message to call office to discuss symptoms.

## 2016-02-10 NOTE — Telephone Encounter (Signed)
Spoke to pt's niece to sch appt 02/11/16 at 11:45.

## 2016-02-10 NOTE — Telephone Encounter (Signed)
Received return call from pt's niece.  Reported the pt. c/o increased swelling and tenderness in the right arm.  Reported the swelling extends from fingers to above the elbow.  Reported there is a warmth that comes and goes.  Denied redness.  Denied fever/ chills.  Requested to have the surgery site looked at.  Discussed with Dr. Donnetta Hutching.  Advised no vascular study needed at this time; only office visit for clinical evaluation.  Will have Office Appt. Scheduler to contact pt's niece with an appt.

## 2016-02-11 ENCOUNTER — Encounter: Payer: Self-pay | Admitting: Family

## 2016-02-11 ENCOUNTER — Ambulatory Visit (INDEPENDENT_AMBULATORY_CARE_PROVIDER_SITE_OTHER): Payer: Self-pay | Admitting: Family

## 2016-02-11 VITALS — BP 201/95 | HR 81 | Temp 98.0°F | Resp 18 | Ht 65.0 in | Wt 199.0 lb

## 2016-02-11 DIAGNOSIS — N185 Chronic kidney disease, stage 5: Secondary | ICD-10-CM

## 2016-02-11 DIAGNOSIS — I770 Arteriovenous fistula, acquired: Secondary | ICD-10-CM

## 2016-02-11 NOTE — Progress Notes (Signed)
    Postoperative Access Visit   History of Present Illness  Eric Glenn is a 50 y.o. year old male who is s/p Ligation (R) R-C AVF, and Creation (R) B-C AVF 01/22/16 by Dr. Scot Dock. Pt is scheduled to see Dr. Scot Dock for 6 weeks follow up on 03/03/16 after dialysis duplex on 03/02/16.  The patient is  able to complete their activities of daily living.   Phone call encounter through Triage nurse yesterday: Received return call from pt's niece.  Reported the pt. c/o increased swelling and tenderness in the right arm.  Reported the swelling extends from fingers to above the elbow.  Reported there is a warmth that comes and goes.  Denied redness.  Denied fever/ chills.  Requested to have the surgery site looked at.  Discussed with Dr. Donnetta Hutching.  Advised no vascular study needed at this time; only office visit for clinical evaluation.  He is not receiving HD yet, niece states as far as she knows pt does not immediately need HD, this is in preparation for anticipated need for HD.  For VQI Use Only  PRE-ADM LIVING: Home  AMB STATUS: Ambulatory  Physical Examination Vitals:   02/11/16 1231 02/11/16 1234  BP: (!) 204/87 (!) 201/95  Pulse: 81   Resp: 18   Temp: 98 F (36.7 C)   TempSrc: Oral   Weight: 199 lb (90.3 kg)   Height: 5\' 5"  (1.651 m)    Body mass index is 33.12 kg/m.   RUE: Incision is healed, skin feels warm and normal, hand grip is 4/5, sensation in digits is intact, palpable thrill at right upper arm AVF,.  Medical Decision Making  Eric Glenn is a 50 y.o. year old male who presents with c/o swelling in his right hand and forearm, and stiffness in his right elbow. Dr. Oneida Alar spoke with pt through his Spanish interpreter and examined pt. Pt instructed in proper elevation of his right arm to minimize the swelling and the stiffness in his elbow. Return in 2 weeks for evaluation of right forearm and hand swelling by Dr. Scot Dock.    Eric Glenn, Sharmon Leyden,  RN, MSN, FNP-C [facility] Office: [phone]  02/11/2016, 12:39 PM  Clinic MD: Oneida Alar

## 2016-02-13 MED FILL — ?CARVEDILOL 25 MG TABLET: 25 | 30 days supply | Qty: 60 | Fill #2

## 2016-02-19 ENCOUNTER — Encounter: Payer: Self-pay | Admitting: Vascular Surgery

## 2016-02-23 MED FILL — ?GLIPIZIDE 5 MG TABLET: 5 | 30 days supply | Qty: 60 | Fill #2

## 2016-02-23 MED FILL — hydrALAZINE HCL 50 MG TABS: 50 | 30 days supply | Qty: 60 | Fill #2

## 2016-02-25 ENCOUNTER — Encounter (HOSPITAL_BASED_OUTPATIENT_CLINIC_OR_DEPARTMENT_OTHER): Payer: Self-pay

## 2016-02-26 ENCOUNTER — Ambulatory Visit: Payer: Self-pay | Admitting: Vascular Surgery

## 2016-02-26 ENCOUNTER — Encounter (HOSPITAL_COMMUNITY): Payer: Self-pay

## 2016-02-26 ENCOUNTER — Encounter: Payer: Self-pay | Admitting: Vascular Surgery

## 2016-02-26 ENCOUNTER — Emergency Department (HOSPITAL_COMMUNITY): Payer: Self-pay

## 2016-02-26 ENCOUNTER — Inpatient Hospital Stay (HOSPITAL_COMMUNITY)
Admission: EM | Admit: 2016-02-26 | Discharge: 2016-02-29 | DRG: 638 | Disposition: A | Discharge status: Home / Self Care | Payer: Self-pay | Attending: Internal Medicine | Admitting: Internal Medicine

## 2016-02-26 DIAGNOSIS — N184 Chronic kidney disease, stage 4 (severe): Secondary | ICD-10-CM

## 2016-02-26 DIAGNOSIS — E118 Type 2 diabetes mellitus with unspecified complications: Secondary | ICD-10-CM

## 2016-02-26 DIAGNOSIS — Z8249 Family history of ischemic heart disease and other diseases of the circulatory system: Secondary | ICD-10-CM

## 2016-02-26 DIAGNOSIS — D649 Anemia, unspecified: Secondary | ICD-10-CM | POA: Diagnosis present

## 2016-02-26 DIAGNOSIS — R197 Diarrhea, unspecified: Secondary | ICD-10-CM

## 2016-02-26 DIAGNOSIS — E1169 Type 2 diabetes mellitus with other specified complication: Secondary | ICD-10-CM

## 2016-02-26 DIAGNOSIS — K5909 Other constipation: Secondary | ICD-10-CM | POA: Diagnosis present

## 2016-02-26 DIAGNOSIS — E11649 Type 2 diabetes mellitus with hypoglycemia without coma: Principal | ICD-10-CM | POA: Diagnosis present

## 2016-02-26 DIAGNOSIS — Z833 Family history of diabetes mellitus: Secondary | ICD-10-CM

## 2016-02-26 DIAGNOSIS — N179 Acute kidney failure, unspecified: Secondary | ICD-10-CM | POA: Diagnosis present

## 2016-02-26 DIAGNOSIS — Z888 Allergy status to other drugs, medicaments and biological substances status: Secondary | ICD-10-CM

## 2016-02-26 DIAGNOSIS — R079 Chest pain, unspecified: Secondary | ICD-10-CM

## 2016-02-26 DIAGNOSIS — Z91018 Allergy to other foods: Secondary | ICD-10-CM

## 2016-02-26 DIAGNOSIS — E785 Hyperlipidemia, unspecified: Secondary | ICD-10-CM | POA: Diagnosis present

## 2016-02-26 DIAGNOSIS — E860 Dehydration: Secondary | ICD-10-CM | POA: Diagnosis present

## 2016-02-26 DIAGNOSIS — N186 End stage renal disease: Secondary | ICD-10-CM | POA: Diagnosis present

## 2016-02-26 DIAGNOSIS — E162 Hypoglycemia, unspecified: Secondary | ICD-10-CM | POA: Diagnosis present

## 2016-02-26 DIAGNOSIS — E11319 Type 2 diabetes mellitus with unspecified diabetic retinopathy without macular edema: Secondary | ICD-10-CM | POA: Diagnosis present

## 2016-02-26 DIAGNOSIS — E876 Hypokalemia: Secondary | ICD-10-CM | POA: Diagnosis present

## 2016-02-26 DIAGNOSIS — E1122 Type 2 diabetes mellitus with diabetic chronic kidney disease: Secondary | ICD-10-CM | POA: Diagnosis present

## 2016-02-26 DIAGNOSIS — I120 Hypertensive chronic kidney disease with stage 5 chronic kidney disease or end stage renal disease: Secondary | ICD-10-CM | POA: Diagnosis present

## 2016-02-26 DIAGNOSIS — E1165 Type 2 diabetes mellitus with hyperglycemia: Secondary | ICD-10-CM

## 2016-02-26 DIAGNOSIS — Z87891 Personal history of nicotine dependence: Secondary | ICD-10-CM

## 2016-02-26 DIAGNOSIS — E1143 Type 2 diabetes mellitus with diabetic autonomic (poly)neuropathy: Secondary | ICD-10-CM | POA: Diagnosis present

## 2016-02-26 DIAGNOSIS — E872 Acidosis: Secondary | ICD-10-CM | POA: Diagnosis present

## 2016-02-26 DIAGNOSIS — Z7984 Long term (current) use of oral hypoglycemic drugs: Secondary | ICD-10-CM

## 2016-02-26 LAB — CBC WITH DIFFERENTIAL/PLATELET
Basophils Absolute: 0 10*3/uL (ref 0.0–0.1)
Basophils Relative: 0 %
EOS PCT: 0 %
Eosinophils Absolute: 0 10*3/uL (ref 0.0–0.7)
HCT: 26.8 % — ABNORMAL LOW (ref 39.0–52.0)
Hemoglobin: 9.1 g/dL — ABNORMAL LOW (ref 13.0–17.0)
LYMPHS ABS: 0.6 10*3/uL — AB (ref 0.7–4.0)
LYMPHS PCT: 6 %
MCH: 29.2 pg (ref 26.0–34.0)
MCHC: 34 g/dL (ref 30.0–36.0)
MCV: 85.9 fL (ref 78.0–100.0)
MONO ABS: 0.6 10*3/uL (ref 0.1–1.0)
MONOS PCT: 5 %
Neutro Abs: 9.2 10*3/uL — ABNORMAL HIGH (ref 1.7–7.7)
Neutrophils Relative %: 89 %
PLATELETS: 103 10*3/uL — AB (ref 150–400)
RBC: 3.12 MIL/uL — AB (ref 4.22–5.81)
RDW: 12.4 % (ref 11.5–15.5)
WBC: 10.4 10*3/uL (ref 4.0–10.5)

## 2016-02-26 LAB — BASIC METABOLIC PANEL
ANION GAP: 13 (ref 5–15)
ANION GAP: 14 (ref 5–15)
BUN: 100 mg/dL — ABNORMAL HIGH (ref 6–20)
BUN: 103 mg/dL — ABNORMAL HIGH (ref 6–20)
CHLORIDE: 109 mmol/L (ref 101–111)
CHLORIDE: 110 mmol/L (ref 101–111)
CO2: 11 mmol/L — ABNORMAL LOW (ref 22–32)
CO2: 8 mmol/L — ABNORMAL LOW (ref 22–32)
Calcium: 7.8 mg/dL — ABNORMAL LOW (ref 8.9–10.3)
Calcium: 7.7 mg/dL — ABNORMAL LOW (ref 8.9–10.3)
Creatinine, Ser: 9.03 mg/dL — ABNORMAL HIGH (ref 0.61–1.24)
Creatinine, Ser: 9.43 mg/dL — ABNORMAL HIGH (ref 0.61–1.24)
GFR calc Af Amer: 7 mL/min — ABNORMAL LOW (ref >60)
GFR calc Af Amer: 7 mL/min — ABNORMAL LOW (ref >60)
GFR calc non Af Amer: 6 mL/min — ABNORMAL LOW (ref >60)
GFR, EST NON AFRICAN AMERICAN: 6 mL/min — AB (ref >60)
Glucose, Bld: 136 mg/dL — ABNORMAL HIGH (ref 65–99)
Glucose, Bld: 77 mg/dL (ref 65–99)
POTASSIUM: 3.8 mmol/L (ref 3.5–5.1)
POTASSIUM: 3.9 mmol/L (ref 3.5–5.1)
SODIUM: 132 mmol/L — AB (ref 135–145)
Sodium: 133 mmol/L — ABNORMAL LOW (ref 135–145)

## 2016-02-26 LAB — I-STAT ARTERIAL BLOOD GAS, ED
Acid-base deficit: 18 mmol/L — ABNORMAL HIGH (ref 0.0–2.0)
Bicarbonate: 9.4 mEq/L — ABNORMAL LOW (ref 20.0–24.0)
O2 SAT: 97 %
PCO2 ART: 26.3 mmHg — AB (ref 35.0–45.0)
PH ART: 7.16 — AB (ref 7.350–7.450)
PO2 ART: 112 mmHg — AB (ref 80.0–100.0)
Patient temperature: 98.6
TCO2: 10 mmol/L (ref 0–100)

## 2016-02-26 LAB — BLOOD GAS, ARTERIAL
Acid-base deficit: 17.3 mmol/L — ABNORMAL HIGH (ref 0.0–2.0)
Bicarbonate: 9.5 mEq/L — ABNORMAL LOW (ref 20.0–24.0)
DRAWN BY: 441351
FIO2: 0.21
O2 SAT: 97.5 %
PH ART: 7.18 — AB (ref 7.350–7.450)
Patient temperature: 98.6
TCO2: 10.3 mmol/L (ref 0–100)
pCO2 arterial: 26.5 mmHg — ABNORMAL LOW (ref 35.0–45.0)
pO2, Arterial: 110 mmHg — ABNORMAL HIGH (ref 80.0–100.0)

## 2016-02-26 LAB — COMPREHENSIVE METABOLIC PANEL
ALBUMIN: 3.3 g/dL — AB (ref 3.5–5.0)
ALT: 39 U/L (ref 17–63)
AST: 54 U/L — AB (ref 15–41)
Alkaline Phosphatase: 49 U/L (ref 38–126)
Anion gap: 14 (ref 5–15)
BUN: 100 mg/dL — AB (ref 6–20)
CHLORIDE: 110 mmol/L (ref 101–111)
CO2: 11 mmol/L — AB (ref 22–32)
CREATININE: 9.2 mg/dL — AB (ref 0.61–1.24)
Calcium: 8.2 mg/dL — ABNORMAL LOW (ref 8.9–10.3)
GFR calc Af Amer: 7 mL/min — ABNORMAL LOW (ref >60)
GFR calc non Af Amer: 6 mL/min — ABNORMAL LOW (ref >60)
GLUCOSE: 70 mg/dL (ref 65–99)
Potassium: 4.1 mmol/L (ref 3.5–5.1)
SODIUM: 135 mmol/L (ref 135–145)
Total Bilirubin: 0.4 mg/dL (ref 0.3–1.2)
Total Protein: 7 g/dL (ref 6.5–8.1)

## 2016-02-26 LAB — CBG MONITORING, ED
GLUCOSE-CAPILLARY: 113 mg/dL — AB (ref 65–99)
GLUCOSE-CAPILLARY: 34 mg/dL — AB (ref 65–99)
GLUCOSE-CAPILLARY: 59 mg/dL — AB (ref 65–99)
GLUCOSE-CAPILLARY: 107 mg/dL — AB (ref 65–99)

## 2016-02-26 LAB — I-STAT CG4 LACTIC ACID, ED: Lactic Acid, Venous: 0.56 mmol/L (ref 0.5–1.9)

## 2016-02-26 LAB — C DIFFICILE QUICK SCREEN W PCR REFLEX
C DIFFICILE (CDIFF) INTERP: NOT DETECTED
C Diff antigen: NEGATIVE
C Diff toxin: NEGATIVE

## 2016-02-26 LAB — GLUCOSE, CAPILLARY
GLUCOSE-CAPILLARY: 146 mg/dL — AB (ref 65–99)
Glucose-Capillary: 74 mg/dL (ref 65–99)
Glucose-Capillary: 62 mg/dL — ABNORMAL LOW (ref 65–99)
Glucose-Capillary: 68 mg/dL (ref 65–99)
Glucose-Capillary: 88 mg/dL (ref 65–99)

## 2016-02-26 LAB — MRSA PCR SCREENING: MRSA BY PCR: POSITIVE — AB

## 2016-02-26 LAB — I-STAT TROPONIN, ED: Troponin i, poc: 0.04 ng/mL (ref 0.00–0.08)

## 2016-02-26 LAB — LIPASE, BLOOD: Lipase: 41 U/L (ref 11–51)

## 2016-02-26 MED ORDER — DEXTROSE 50 % IV SOLN
INTRAVENOUS | Status: AC
  Filled 2016-02-26: qty 50

## 2016-02-26 MED ORDER — SODIUM CHLORIDE 0.9 % IV BOLUS (SEPSIS)
1000.0000 mL | Freq: Once | INTRAVENOUS | Status: AC
  Administered 2016-02-27: 1000 mL via INTRAVENOUS

## 2016-02-26 MED ORDER — SODIUM CHLORIDE 0.9 % IV BOLUS (SEPSIS): 1000.0000 mL | Freq: Once | INTRAVENOUS | Status: DC

## 2016-02-26 MED ORDER — DEXTROSE 10 % IV SOLN
INTRAVENOUS | Status: DC
  Administered 2016-02-26 – 2016-02-27 (×2): via INTRAVENOUS
  Administered 2016-02-27: 1000 mL via INTRAVENOUS

## 2016-02-26 MED ORDER — MUPIROCIN 2 % EX OINT
1.0000 "application " | TOPICAL_OINTMENT | Freq: Two times a day (BID) | CUTANEOUS | Status: DC
  Administered 2016-02-26 – 2016-02-28 (×5): 1 via NASAL
  Filled 2016-02-26 (×2): qty 22

## 2016-02-26 MED ORDER — DEXTROSE 50 % IV SOLN
1.0000 | Freq: Once | INTRAVENOUS | Status: AC
  Administered 2016-02-26: 50 mL via INTRAVENOUS

## 2016-02-26 MED ORDER — SODIUM CHLORIDE 0.9 % IV BOLUS (SEPSIS)
500.0000 mL | Freq: Once | INTRAVENOUS | Status: AC
  Administered 2016-02-26: 500 mL via INTRAVENOUS

## 2016-02-26 MED ORDER — SODIUM BICARBONATE 8.4 % IV SOLN
INTRAVENOUS | Status: DC
  Administered 2016-02-26 – 2016-02-28 (×3): via INTRAVENOUS
  Filled 2016-02-26 (×8): qty 150

## 2016-02-26 MED ORDER — SODIUM BICARBONATE 8.4 % IV SOLN
INTRAVENOUS | Status: DC
  Administered 2016-02-26: 18:00:00 via INTRAVENOUS
  Filled 2016-02-26 (×3): qty 50

## 2016-02-26 MED ORDER — DEXAMETHASONE SODIUM PHOSPHATE 4 MG/ML IJ SOLN: 4.0000 mg | Freq: Once | INTRAMUSCULAR | Status: DC

## 2016-02-26 MED ORDER — DEXTROSE 50 % IV SOLN
1.0000 | Freq: Once | INTRAVENOUS | Status: AC
  Administered 2016-02-26: 50 mL via INTRAVENOUS
  Filled 2016-02-26: qty 50

## 2016-02-26 MED ORDER — DEXTROSE 50 % IV SOLN
INTRAVENOUS | Status: AC
  Administered 2016-02-26: 50 mL
  Filled 2016-02-26: qty 50

## 2016-02-26 MED ORDER — ONDANSETRON HCL 4 MG/2ML IJ SOLN
4.0000 mg | Freq: Once | INTRAMUSCULAR | Status: AC
  Administered 2016-02-26: 4 mg via INTRAVENOUS
  Filled 2016-02-26: qty 2

## 2016-02-26 MED ORDER — CHLORHEXIDINE GLUCONATE CLOTH 2 % EX PADS
6.0000 | MEDICATED_PAD | Freq: Every day | CUTANEOUS | Status: DC
  Administered 2016-02-27 – 2016-02-28 (×2): 6 via TOPICAL

## 2016-02-26 MED ORDER — ACETAMINOPHEN 500 MG PO TABS: 1000.0000 mg | ORAL_TABLET | Freq: Once | ORAL | Status: DC

## 2016-02-26 MED ORDER — ACETAMINOPHEN 500 MG PO TABS
1000.0000 mg | ORAL_TABLET | Freq: Once | ORAL | Status: AC
  Administered 2016-02-26: 1000 mg via ORAL
  Filled 2016-02-26: qty 2

## 2016-02-26 NOTE — Progress Notes (Signed)
Hypoglycemic Event  CBG: 62   Treatment: D50 IV 25 mL  Symptoms: None  Follow-up CBG: Time:2138 CBG Result:146  Possible Reasons for Event: Unknown  Comments/MD notified:  [facility] Dr. Dorna Bloom, Conan Bowens

## 2016-02-26 NOTE — ED Notes (Signed)
Report attempted 

## 2016-02-26 NOTE — H&P (Signed)
Date: 02/26/2016               Patient Name:  Eric Glenn MRN: [id number]  DOB: 03-08-1966 Age / Sex: 50 y.o., male   PCP: Arnoldo Morale, MD         Medical Service: Internal Medicine Teaching Service         Attending Physician: Dr. Bartholomew Crews, MD    First Contact: Dr. Ledell Noss  Pager: [phone]  Second Contact: Dr. Charlott Rakes   Pager: [phone]       After Hours (After 5p/  First Contact Pager: [phone]  weekends / holidays): Second Contact Pager: [phone]   Chief Complaint: weakness and diarrhea   History of Present Illness: [name] is a 50 y.o. male with a PMH of Stage 5 ESRD, T2DM, HTN, and HLD who presents with nausea, vomiting, watery diarrhea, and weakness. He has chronic constipation and was constipated on Monday 8/7 of this week so he took lactulose rx which he was prescribed for diarrhea. That evening he began experiencing nausea vomiting and watery diarrhea. He continued having diarrhea all of this week and yesterday was found to be weak and unable to get out of the bed. This morning he was still weak and started to have chills and a headache. He tried eating toast and crackers this week but was not able to keep any food down without vomiting however he did continue taking his home medications which include Glipizide 5g. Denies SOB, blurred vision, dizziness. He has had no prior admissions for hypoglycemia.   In the ED he was found to have blood glucose of 34, Bicarb 11, BUN 100, SCr 9.2, Lactic acid 0.56, Troponin 0.04, EKG was NSR with LV hypertrophy, and he was hypotensive. Blood cultures were drawn and he was given 2x 50 ml bolus of D 50 and started on a sodium bicarb with dextrose 100 ml/ hr. ABG showed anion gap metabolic acidosis with a non gap acidosis.   Meds:  Glipizide 5g BID with meals  Hydral 50 BID  Furosemide 80 BID  Carvedilol Pravastatin 40mg  qd   Allergies: Allergies as of 02/26/2016 - Review Complete 02/26/2016    Allergen Reaction Noted  . Poractant alfa Rash and Other (See Comments) 01/07/2016  . Pork-derived products Rash and Other (See Comments) 08/03/2015   Past Medical History:  Diagnosis Date  . Anemia   . Chronic kidney disease (CKD), stage IV (severe) ([place])   . Diabetes mellitus without complication ([place])   . Diabetic retinopathy ([place])   . High cholesterol   . Hypertension    Family History:  Multiple family member with DM   Social History:  Denies alcohol, drugs, or tobacco use   Review of Systems: A complete ROS was negative except as per HPI.   Physical Exam: Vitals:   02/26/16 1445 02/26/16 1500 02/26/16 1515 02/26/16 1724  BP: 104/60 102/58 (!) 98/48 (!) 83/45  Pulse: 77 76 75 72  Resp: 15 17 23 17   Temp:    98.1 F (36.7 C)  TempSrc:    Oral  SpO2: 99% 98% 99% 99%   Physical Exam  Constitutional: He is oriented to person, place, and time. He appears well-developed and well-nourished. He appears lethargic. He appears ill.  Appears fatigued and lethargic, slumped over in the bed   HENT:  Bilateral pterygium   Cardiovascular: Normal rate and regular rhythm.   No murmur heard. Pulmonary/Chest: Effort normal. No respiratory distress. He has  no wheezes. He has no rales.  Abdominal: Soft. He exhibits no distension. There is no tenderness.  Musculoskeletal: He exhibits no edema.  Neurological: He is oriented to person, place, and time. He has normal strength. He appears lethargic.  Reflex Scores:      Patellar reflexes are 2+ on the right side and 2+ on the left side.  Labs: CBC:  Recent Labs Lab 02/26/16 1345  WBC 10.4  NEUTROABS 9.2*  HGB 9.1*  HCT 26.8*  MCV 85.9  PLT [id number]*   Basic Metabolic Panel:  Recent Labs Lab 02/26/16 1345  NA 135  K 4.1  CL 110  CO2 11*  GLUCOSE 70  BUN 100*  CREATININE 9.20*  CALCIUM 8.2*   Cardiac Enzymes:  Recent Labs Lab 02/26/16 1357  TROPIPOC 0.04   Liver Function Tests:  Recent Labs Lab 02/26/16 1345   AST 54*  ALT 39  ALKPHOS 49  BILITOT 0.4  PROT 7.0  ALBUMIN 3.3*    Recent Labs Lab 02/26/16 1345  LIPASE 41   CBG: Lab Results  Component Value Date   HGBA1C 7.7 (H) 12/09/2015    Recent Labs Lab 02/26/16 1257 02/26/16 1528 02/26/16 1701  GLUCAP 113* 34* 48*   Microbiology: Results for orders placed or performed during the hospital encounter of 02/26/16  Culture, blood (routine x 2)     Status: None (Preliminary result)   Collection Time: 02/26/16  1:45 PM  Result Value Ref Range Status   Specimen Description BLOOD LEFT HAND  Final   Special Requests BOTTLES DRAWN AEROBIC AND ANAEROBIC 5CC  Final   Culture NO GROWTH <12 HOURS  Final   Report Status PENDING  Incomplete   Drugs of Abuse  No results found for: LABOPIA, COCAINSCRNUR, LABBENZ, AMPHETMU, THCU, LABBARB   EKG: EKG: normal sinus rhythm, LVH   Imaging: Dg Chest 2 View  Result Date: 02/26/2016 CLINICAL DATA:  Nausea and vomiting for 3 days EXAM: CHEST  2 VIEW COMPARISON:  12/08/2015 FINDINGS: Cardiac shadow is mildly enlarged. Lungs are well aerated bilaterally. Mild vascular congestion is again seen without interstitial edema. No focal infiltrate or sizable effusion is noted. IMPRESSION: Mild stable vascular congestion. No other focal abnormality is noted. Electronically Signed   By: Inez Catalina M.D.   On: 02/26/2016 14:23   Assessment & Plan by Problem: [name] is a 50 y.o. male with PMH ESRD, T2 DM, HTN, HLD. Presented today with hypoglycemia and N/V/D and weakness.   Active Problems:   Dehydration  1. Hypoglycemia, DM on glipizide 5g at home he was taking this med while he was not able to tolerate keeping down food for the last 4 days, also in the setting of renal insufficiency he may have decreased sulfonourea clearance in general.   On sodium bicarb drip   CBG q 1 hour   2. U[place]ic metabolic acidosis with non gap acidosis he has ESRD Stage 5 -BUN 100, SCr 9.2 found on  admission, he recently had graft placement 1 month ago in anticipation of starting dialysis. Non gap acidosis may   Receiving fluids with sodium bicarb drip  Ordered BMET q 4 hours to monitor, if his BUN does not improve he may require emergent hemodialysis.   3. Diarrhea etiology is not clear at this time, may be related to lactulose. For now we will focus on stabilizing his hypoglycemia and metabolic acidosis.   4. Chest pain -vague chest pain symptoms, EKG showed NSR no ST changes. Will trend  troponin to be sure this is not CAD.   5. HTN on Hydral and carvedilol at home, currently he is hypotensive so we will hold home meds for now   6. HLD Lipid panel Chol 93, TAG 166, ASCVD can not be calculated because he has a normal total cholesterol today. Will hold home Pravastatin 40 qd for now until he is better able to tolerate po meds  F bicarb 50 mEq in dextrose drip  E none  N clear liquid  DVT Ppx SCDs he is allergic to pork products and cannot receive  Code Status FULL    Dispo: Admit patient to Inpatient with expected length of stay greater than 2 midnights.  Signed: Ledell Noss, MD 02/26/2016, 5:31 PM  Pager: [phone]

## 2016-02-26 NOTE — ED Notes (Signed)
Report given to [facility] RN -- will transport pt at [place].

## 2016-02-26 NOTE — Progress Notes (Addendum)
Dr. Danford Bad and I were paged by the nurse about hypoglycemia of 62. We advised to give an amp of D50. After reviewing his most recent labs with bicarb 8 (down from 11) we decided to go evaluate the patient.  Patient was resting comfortably in bed. Family member at bedside was able to help [name] translate as patient is Spanish-speaking. He reports mild abdominal discomfort, but otherwise is feeling fine. He reports he had some intermittent chest pain in the last few days, but none currently.   Physical Exam General: middle aged man resting in bed, NAD HEENT: /AT, EOMI, sclera anicteric, mucus membranes moist CV: RRR, no murmurs, gallops, or rubs appreciated Pulm: CTA anteriorly  Abd: BS+, soft, non-tender, non-distended Ext: warm, no edema  Neuro: alert and oriented to person, place, and time  Assessment/Plan: Eric Glenn is a 50yo man with PMHx of ESRD not on HD, type 2 DM, and HTN who presented today with nausea, vomiting, watery diarrhea, and weakness found to be hypoglycemic, hypotensive, in acute on chronic renal failure with Cr 9, and with an AG metabolic acidosis due to uremia and a non gap metabolic acidosis. Initial pH 7.1, pCO2 26 and serum bicarb 11. He was started on a bicarb gtt with D5. His bicarb is now 8 on last bmet. His hypoglycemia was attributed to taking his sulfonylurea in the setting of poor PO intake. C diff negative on admission. I see that critical care has ordered a repeat ABG. Will follow this up and call Nephrology if pH less than 7.1 as he will likely need HD. Another concern with his nausea/vomiting, hypoglycemia, hypotension, mild hyponatremia, and anemia is adrenal crisis. However, not on steroids at home and he does not have all typical electrolyte abnormalities (hyperkalemia, hypercalcemia). Will have low threshold to check cortisol and start steroids if continues to worsen. Shock secondary to infection also a concern. Blood cx negative so far. Will continue to  monitor patient closely.  Albin Felling, MD, MPH Internal Medicine Resident, PGY-III Pager: [phone]  Addendum: CCM changed IVFs to D5HCO3 150 mEq at 100 ml/hr. Will follow up repeat ABG at 5 AM. Appreciate CCM's help with this case.

## 2016-02-26 NOTE — Progress Notes (Signed)
Interpreter ID [id number]. Oriented patient to the unit and RN and NT. Patient oriented x3( unaware of the date). Daughter is at bedside. Patient denied pain or discomfort. Will continue to monitor and endorse.

## 2016-02-26 NOTE — Progress Notes (Signed)
[facility] [place] Progress Note Patient Name: Eric Glenn DOB: 03-19-1966 MRN: [id number]   Date of Service  02/26/2016  HPI/Events of Note  Initial ABG = 7.16/26/112/9.4. Lactic Acid = 0.56. I suspect that his metabolic acidosis is d/t renal failure and HCO3- loss in diarrhea. Bedside nurse informs me that there is no NaHCO3 IV infusion running.   eICU Interventions  Will order: 1. ABG STAT. Will use ABG results to direct further therapy.      Intervention Category Major Interventions: Acid-Base disturbance - evaluation and management  Sommer,Steven Eugene 02/26/2016, 9:53 PM

## 2016-02-26 NOTE — ED Notes (Signed)
Pt had moderate amount of yellow liquid stool. Dr. Laverta Baltimore notified. Orders received.

## 2016-02-26 NOTE — ED Notes (Signed)
Nitro Patch removed

## 2016-02-26 NOTE — ED Triage Notes (Signed)
Pt CBG on arrival is 113

## 2016-02-26 NOTE — ED Notes (Signed)
1 amp D50 given for CBG 34-- pt on [facility] , daughter with pt. Pt given ginger ale and [place] sandwich.

## 2016-02-26 NOTE — ED Provider Notes (Signed)
Emergency Department Provider Note   I have reviewed the triage vital signs and the nursing notes.   HISTORY  Chief Complaint Diarrhea and Hypoglycemia   HPI Eric Glenn is a 50 y.o. male with PMH of anemia, CKD, DM, HLD, and HTN resents to the emergency department for evaluation of diarrhea and hypoglycemia. Patient has had 3 days of diarrhea along with nausea and vomiting. He reports onset of symptoms with taking a laxative. The patient has Eric Glenn history of constipation and took the laxative as prescribed. As developed large volume watery diarrhea over the past 3 days. He notes some associated shaking chills, mild headache, and muscle aches. He is endorsing some chest pain that started this morning. Pain is in the center of his chest and nonradiating. Patient denies seeing any gross blood or black in his stool. No recorded fevers at home. No neck stiffness. No known sick contacts. The patient is noted to have worsening failure and is rapidly moving toward requiring dialysis with recent fistula placement.  When EMS arrived on scene they found him to be hypoglycemic and administered D50.    Past Medical History:  Diagnosis Date  . Anemia   . Chronic kidney disease (CKD), stage IV (severe) ([place])   . Diabetes mellitus without complication ([place])   . Diabetic retinopathy ([place])   . High cholesterol   . Hypertension     Patient Active Problem List   Diagnosis Date Noted  . Asthma 10/01/2015  . Hypersomnia 10/01/2015  . Cough 10/01/2015  . Obesity 10/01/2015  . Acute respiratory failure with hypoxia ([place]) 08/29/2015  . Orthopnea 08/29/2015  . Shortness of breath   . DOE (dyspnea on exertion) 08/28/2015  . Normocytic anemia 08/28/2015  . Suspected OSA  08/28/2015  . Constipation 08/28/2015  . Hyperlipidemia 08/19/2015  . Chronic kidney disease, stage IV (severe) ([place]) 08/19/2015  . Pedal edema 08/19/2015  . Sinusitis, chronic 08/19/2015  . Essential hypertension  08/04/2015  . Chest pain 08/04/2015  . Diabetes mellitus type 2, controlled ([place]) 08/04/2015  . Diabetic retinopathy associated with type 2 diabetes mellitus ([place]) 08/04/2015  . Proteinuria 08/04/2015  . Leukocytosis 08/04/2015  . Acute renal insufficiency 08/03/2015    Past Surgical History:  Procedure Laterality Date  . AV FISTULA PLACEMENT Right 09/04/2015   Procedure: ARTERIOVENOUS (AV) FISTULA CREATION;  Surgeon: Angelia Mould, MD;  Location: [facility];  Service: Vascular;  Laterality: Right;  . AV FISTULA PLACEMENT Right 01/22/2016   Procedure: RIGHT UPPER ARM BRACHIOCEPHALIC ARTERIOVENOUS (AV) FISTULA CREATION;  Surgeon: Angelia Mould, MD;  Location: [facility];  Service: Vascular;  Laterality: Right;  . LIGATION OF ARTERIOVENOUS  FISTULA Right 01/22/2016   Procedure: LIGATION OF RIGHT FOREARM ARTERIOVENOUS  FISTULA;  Surgeon: Angelia Mould, MD;  Location: [facility];  Service: Vascular;  Laterality: Right;  . PERIPHERAL VASCULAR CATHETERIZATION N/A 01/12/2016   Procedure: Fistulagram;  Surgeon: Angelia Mould, MD;  Location: [facility];  Service: Cardiovascular;  Laterality: N/A;  . UMBILICAL HERNIA REPAIR      Current Outpatient Rx  . Order #: [id number] Class: Print  . Order #: [id number] Class: Normal  . Order #: [id number] Class: Normal  . Order #: [id number] Class: Normal  . Order #: [id number] Class: Normal  . Order #: [id number] Class: Historical Med  . Order #: [id number] Class: Normal  . Order #: [id number] Class: Normal  . Order #: [id number] Class: Normal  . Order #: [id number] Class: Normal  . Order #: [id number] Class: Historical Med  . Order #: [id number] Class:  Normal    Allergies Poractant alfa and Pork-derived products  Family History  Problem Relation Age of Onset  . Hypertension Mother   . Hyperlipidemia Mother   . Diabetes Mellitus II Sister     Social History Social History  Substance Use Topics  . Smoking status: Former Smoker    Quit date:  10/22/2003  . Smokeless tobacco: Never Used  . Alcohol use No    Review of Systems  Constitutional: No fever/chills Eyes: No visual changes. ENT: No sore throat. Cardiovascular: Denies chest pain. Respiratory: Denies shortness of breath. Gastrointestinal: No abdominal pain. Positive nausea and vomiting.  Positive diarrhea.  No constipation. Genitourinary: Negative for dysuria. Musculoskeletal: Negative for back pain. Skin: Negative for rash. Neurological: Negative for headaches, focal weakness or numbness.  10-point ROS otherwise negative.  ____________________________________________   PHYSICAL EXAM:  VITAL SIGNS: ED Triage Vitals  Enc Vitals Group     BP 02/26/16 1300 98/67     Pulse Rate 02/26/16 1300 70     Resp 02/26/16 1315 20     Temp 02/26/16 1256 98.7 F (37.1 C)     Temp Source 02/26/16 1256 Oral     SpO2 02/26/16 1300 99 %     Pain Score 02/26/16 1258 5   Constitutional: Alert and oriented. Appears uncomfortable.  Eyes: Conjunctivae are normal. PERRL. EOMI. Head: Atraumatic. Nose: No congestion/rhinnorhea. Mouth/Throat: Mucous membranes are moist.  Oropharynx non-erythematous. Neck: No stridor.  Cardiovascular: Normal rate, regular rhythm. Good peripheral circulation. Grossly normal heart sounds.   Respiratory: Normal respiratory effort.  No retractions. Lungs CTAB. Gastrointestinal: Soft and nontender. No distention.  Musculoskeletal: No lower extremity tenderness nor edema. No gross deformities of extremities. Neurologic:  Normal speech and language. No gross focal neurologic deficits are appreciated.  Skin:  Skin is warm, dry and intact. No rash noted. Psychiatric: Mood and affect are normal. Speech and behavior are normal.  ____________________________________________   LABS (all labs ordered are listed, but only abnormal results are displayed)  Labs Reviewed  COMPREHENSIVE METABOLIC PANEL - Abnormal; Notable for the following:       Result Value    CO2 11 (*)    BUN 100 (*)    Creatinine, Ser 9.20 (*)    Calcium 8.2 (*)    Albumin 3.3 (*)    AST 54 (*)    GFR calc non Af Amer 6 (*)    GFR calc Af Amer 7 (*)    All other components within normal limits  CBC WITH DIFFERENTIAL/PLATELET - Abnormal; Notable for the following:    RBC 3.12 (*)    Hemoglobin 9.1 (*)    HCT 26.8 (*)    Platelets 103 (*)    Neutro Abs 9.2 (*)    Lymphs Abs 0.6 (*)    All other components within normal limits  CBG MONITORING, ED - Abnormal; Notable for the following:    Glucose-Capillary 113 (*)    All other components within normal limits  CBG MONITORING, ED - Abnormal; Notable for the following:    Glucose-Capillary 34 (*)    All other components within normal limits  CULTURE, BLOOD (ROUTINE X 2)  CULTURE, BLOOD (ROUTINE X 2)  LIPASE, BLOOD  URINALYSIS, ROUTINE W REFLEX MICROSCOPIC (NOT AT [facility])  I-STAT TROPOININ, ED  I-STAT CG4 LACTIC ACID, ED   ____________________________________________  EKG  Reviewed in MUSE. No STEMI.  ____________________________________________  RADIOLOGY  Dg Chest 2 View  Result Date: 02/26/2016 CLINICAL DATA:  Nausea and vomiting  for 3 days EXAM: CHEST  2 VIEW COMPARISON:  12/08/2015 FINDINGS: Cardiac shadow is mildly enlarged. Lungs are well aerated bilaterally. Mild vascular congestion is again seen without interstitial edema. No focal infiltrate or sizable effusion is noted. IMPRESSION: Mild stable vascular congestion. No other focal abnormality is noted. Electronically Signed   By: Inez Catalina M.D.   On: 02/26/2016 14:23    ____________________________________________   PROCEDURES  Procedure(s) performed:   Procedures  None ____________________________________________   INITIAL IMPRESSION / ASSESSMENT AND PLAN / ED COURSE  Pertinent labs & imaging results that were available during my care of the patient were reviewed by me and considered in my medical decision making (see chart for  details).  Patient presents to the emergency department with 3 days of diarrhea and vomiting with it hypoglycemia, mild headache, chest discomfort. Patient appears uncomfortable with shivering. Abdomen is soft and nontender to palpation. Lungs are clear to auscultation bilaterally. Suspect underlying infectious etiology for the patient's symptoms. Low suspicion for meningitis based on exam. Patient is also having chest discomfort. We'll evaluate with chest x-ray to rule out pneumonia. Low suspicion for ACS with diarrhea and vomiting but will obtain EKG and troponin. Will give Tylenol and Zofran. With CKD with be gentile with IVF administration.   03:45 PM Discussed patient's case with teaching service, Dr. Zenovia Jarred team.  Recommend admission to obs, telemetry bed.  I will place holding orders per their request. Patient and family (if present) updated with plan. Care transferred to medicine teaching service.  I reviewed all nursing notes, vitals, pertinent old records, EKGs, labs, imaging (as available).  ____________________________________________  FINAL CLINICAL IMPRESSION(S) / ED DIAGNOSES  Final diagnoses:  Diarrhea, unspecified type  Dehydration     MEDICATIONS GIVEN DURING THIS VISIT:  Medications  dextrose 50 % solution (not administered)  dextrose 50 % solution 50 mL (not administered)  ondansetron (ZOFRAN) injection 4 mg (4 mg Intravenous Given 02/26/16 1348)  acetaminophen (TYLENOL) tablet 1,000 mg (1,000 mg Oral Given 02/26/16 1348)  sodium chloride 0.9 % bolus 500 mL (0 mLs Intravenous Stopped 02/26/16 1521)     NEW OUTPATIENT MEDICATIONS STARTED DURING THIS VISIT:  None   Note:  This document was prepared using Dragon voice recognition software and may include unintentional dictation errors.  Nanda Quinton, MD Emergency Medicine   Margette Fast, MD 02/26/16 [phone]

## 2016-02-26 NOTE — ED Triage Notes (Signed)
Pt. BIB [facility] C/O of nausea and diarrhea for the past 3 days. EMS found pt. With a CBG of 45. 30 oral glucose and 12.5 of IV D50 administered in truck. Pt. Recently diagnosed with kidney failure and fistula placed last month.

## 2016-02-26 NOTE — Progress Notes (Signed)
[facility] Progress Note Patient Name: Ramari Reome DOB: 05-21-1966 MRN: [id number]   Date of Service  02/26/2016  HPI/Events of Note  ABG = 7.18/26.5/110/9.5.  eICU Interventions  Will order: 1. D5 NaHCO3 150 meq/liter to run IV at 100 mL/hour. 2. ABG at 5 AM.     Intervention Category Major Interventions: Acid-Base disturbance - evaluation and management  Hridaan Bouse Eugene 02/26/2016, 10:26 PM

## 2016-02-27 DIAGNOSIS — E876 Hypokalemia: Secondary | ICD-10-CM

## 2016-02-27 DIAGNOSIS — R112 Nausea with vomiting, unspecified: Secondary | ICD-10-CM

## 2016-02-27 DIAGNOSIS — I951 Orthostatic hypotension: Secondary | ICD-10-CM

## 2016-02-27 DIAGNOSIS — N179 Acute kidney failure, unspecified: Secondary | ICD-10-CM

## 2016-02-27 LAB — BASIC METABOLIC PANEL
Anion gap: 15 (ref 5–15)
Anion gap: 15 (ref 5–15)
Anion gap: 16 — ABNORMAL HIGH (ref 5–15)
Anion gap: 15 (ref 5–15)
Anion gap: 12 (ref 5–15)
BUN: 102 mg/dL — ABNORMAL HIGH (ref 6–20)
BUN: 102 mg/dL — ABNORMAL HIGH (ref 6–20)
BUN: 101 mg/dL — AB (ref 6–20)
BUN: 99 mg/dL — AB (ref 6–20)
BUN: 97 mg/dL — AB (ref 6–20)
CALCIUM: 7.1 mg/dL — AB (ref 8.9–10.3)
CALCIUM: 7.2 mg/dL — AB (ref 8.9–10.3)
CHLORIDE: 103 mmol/L (ref 101–111)
CO2: 10 mmol/L — AB (ref 22–32)
CO2: 12 mmol/L — AB (ref 22–32)
CO2: 12 mmol/L — AB (ref 22–32)
CO2: 14 mmol/L — AB (ref 22–32)
CO2: 17 mmol/L — ABNORMAL LOW (ref 22–32)
CREATININE: 9.3 mg/dL — AB (ref 0.61–1.24)
CREATININE: 9.27 mg/dL — AB (ref 0.61–1.24)
CREATININE: 8.59 mg/dL — AB (ref 0.61–1.24)
CREATININE: 8.34 mg/dL — AB (ref 0.61–1.24)
Calcium: 7.4 mg/dL — ABNORMAL LOW (ref 8.9–10.3)
Calcium: 7.1 mg/dL — ABNORMAL LOW (ref 8.9–10.3)
Calcium: 7.1 mg/dL — ABNORMAL LOW (ref 8.9–10.3)
Chloride: 107 mmol/L (ref 101–111)
Chloride: 104 mmol/L (ref 101–111)
Chloride: 103 mmol/L (ref 101–111)
Chloride: 103 mmol/L (ref 101–111)
Creatinine, Ser: 9.37 mg/dL — ABNORMAL HIGH (ref 0.61–1.24)
GFR calc Af Amer: 7 mL/min — ABNORMAL LOW (ref >60)
GFR calc Af Amer: 7 mL/min — ABNORMAL LOW (ref >60)
GFR calc Af Amer: 7 mL/min — ABNORMAL LOW (ref >60)
GFR calc Af Amer: 7 mL/min — ABNORMAL LOW (ref >60)
GFR calc non Af Amer: 6 mL/min — ABNORMAL LOW (ref >60)
GFR calc non Af Amer: 6 mL/min — ABNORMAL LOW (ref >60)
GFR calc non Af Amer: 6 mL/min — ABNORMAL LOW (ref >60)
GFR calc non Af Amer: 6 mL/min — ABNORMAL LOW (ref >60)
GFR calc non Af Amer: 7 mL/min — ABNORMAL LOW (ref >60)
GFR, EST AFRICAN AMERICAN: 8 mL/min — AB (ref >60)
GLUCOSE: 102 mg/dL — AB (ref 65–99)
GLUCOSE: 122 mg/dL — AB (ref 65–99)
Glucose, Bld: 109 mg/dL — ABNORMAL HIGH (ref 65–99)
Glucose, Bld: 153 mg/dL — ABNORMAL HIGH (ref 65–99)
Glucose, Bld: 144 mg/dL — ABNORMAL HIGH (ref 65–99)
POTASSIUM: 3.4 mmol/L — AB (ref 3.5–5.1)
Potassium: 3.4 mmol/L — ABNORMAL LOW (ref 3.5–5.1)
Potassium: 3.4 mmol/L — ABNORMAL LOW (ref 3.5–5.1)
Potassium: 3.1 mmol/L — ABNORMAL LOW (ref 3.5–5.1)
Potassium: 3.1 mmol/L — ABNORMAL LOW (ref 3.5–5.1)
SODIUM: 131 mmol/L — AB (ref 135–145)
Sodium: 132 mmol/L — ABNORMAL LOW (ref 135–145)
Sodium: 131 mmol/L — ABNORMAL LOW (ref 135–145)
Sodium: 132 mmol/L — ABNORMAL LOW (ref 135–145)
Sodium: 132 mmol/L — ABNORMAL LOW (ref 135–145)

## 2016-02-27 LAB — BLOOD GAS, ARTERIAL
Acid-base deficit: 14.7 mmol/L — ABNORMAL HIGH (ref 0.0–2.0)
BICARBONATE: 11.3 meq/L — AB (ref 20.0–24.0)
DRAWN BY: 441351
FIO2: 0.21
O2 SAT: 97.4 %
PH ART: 7.235 — AB (ref 7.350–7.450)
PO2 ART: 101 mmHg — AB (ref 80.0–100.0)
Patient temperature: 98.6
TCO2: 12.2 mmol/L (ref 0–100)
pCO2 arterial: 27.7 mmHg — ABNORMAL LOW (ref 35.0–45.0)

## 2016-02-27 LAB — GLUCOSE, CAPILLARY
GLUCOSE-CAPILLARY: 82 mg/dL (ref 65–99)
GLUCOSE-CAPILLARY: 73 mg/dL (ref 65–99)
GLUCOSE-CAPILLARY: 119 mg/dL — AB (ref 65–99)
Glucose-Capillary: 101 mg/dL — ABNORMAL HIGH (ref 65–99)
Glucose-Capillary: 93 mg/dL (ref 65–99)
Glucose-Capillary: 95 mg/dL (ref 65–99)
Glucose-Capillary: 108 mg/dL — ABNORMAL HIGH (ref 65–99)
Glucose-Capillary: 128 mg/dL — ABNORMAL HIGH (ref 65–99)
Glucose-Capillary: 172 mg/dL — ABNORMAL HIGH (ref 65–99)
Glucose-Capillary: 147 mg/dL — ABNORMAL HIGH (ref 65–99)

## 2016-02-27 LAB — CORTISOL: Cortisol, Plasma: 21.7 ug/dL

## 2016-02-27 LAB — MAGNESIUM: Magnesium: 1.9 mg/dL (ref 1.7–2.4)

## 2016-02-27 MED ORDER — ONDANSETRON HCL 4 MG/2ML IJ SOLN: 4.0000 mg | Freq: Three times a day (TID) | INTRAMUSCULAR | Status: DC | PRN

## 2016-02-27 MED ORDER — POTASSIUM CHLORIDE CRYS ER 20 MEQ PO TBCR
20.0000 meq | EXTENDED_RELEASE_TABLET | Freq: Once | ORAL | Status: AC
  Administered 2016-02-27: 20 meq via ORAL
  Filled 2016-02-27: qty 1

## 2016-02-27 MED ORDER — POTASSIUM CHLORIDE CRYS ER 20 MEQ PO TBCR
40.0000 meq | EXTENDED_RELEASE_TABLET | Freq: Once | ORAL | Status: AC
  Administered 2016-02-27: 40 meq via ORAL
  Filled 2016-02-27: qty 2

## 2016-02-27 MED ORDER — POTASSIUM CHLORIDE CRYS ER 20 MEQ PO TBCR: 40.0000 meq | EXTENDED_RELEASE_TABLET | Freq: Once | ORAL | Status: DC

## 2016-02-27 MED ORDER — POTASSIUM CHLORIDE CRYS ER 20 MEQ PO TBCR
40.0000 meq | EXTENDED_RELEASE_TABLET | Freq: Two times a day (BID) | ORAL | Status: AC
  Administered 2016-02-28 (×3): 40 meq via ORAL
  Filled 2016-02-27 (×4): qty 2

## 2016-02-27 MED ORDER — ACETAMINOPHEN 325 MG PO TABS
650.0000 mg | ORAL_TABLET | Freq: Four times a day (QID) | ORAL | Status: DC | PRN
  Administered 2016-02-27: 650 mg via ORAL
  Filled 2016-02-27: qty 2

## 2016-02-27 MED ORDER — POTASSIUM CHLORIDE CRYS ER 20 MEQ PO TBCR: 40.0000 meq | EXTENDED_RELEASE_TABLET | Freq: Two times a day (BID) | ORAL | Status: DC

## 2016-02-27 MED ORDER — ACETAMINOPHEN 500 MG PO TABS
500.0000 mg | ORAL_TABLET | Freq: Once | ORAL | Status: AC
  Administered 2016-02-27: 500 mg via ORAL
  Filled 2016-02-27: qty 1

## 2016-02-27 NOTE — Progress Notes (Signed)
Subjective: Today [name] states he is feeling better than yesterday. He has been experiencing a throbbing of his head, chills and total body ache but denies headache or abdominal pain. He has not had any episodes of diarrhea while he has been in the hospital, this is much improved from the episodes of diarrhea he was having every 15-20 minutes at home. He continues to experience nausea and has been retching. He ate a sandwich in the ED but has not eaten anything since. His niece is in the room they are updated on the plan.   Objective:  Vital signs in last 24 hours: Vitals:   02/27/16 0600 02/27/16 0700 02/27/16 1202 02/27/16 1603  BP: 123/65 125/71 121/69 130/66  Pulse: 74 73 73 73  Resp: 20 17 16    Temp:  99.3 F (37.4 C) 98.6 F (37 C) 97.9 F (36.6 C)  TempSrc:  Oral Oral Oral  SpO2: 100% 100% 100% 100%  Weight:      Height:       Physical Exam  Constitutional: He is oriented to person, place, and time. He appears well-developed and well-nourished. No distress.  Cardiovascular: Normal rate and regular rhythm.   No murmur heard. Pulmonary/Chest: Effort normal. No respiratory distress. He has no wheezes. He has rales.  Slight crackles right lung base   Abdominal: Soft. He exhibits no distension. There is no tenderness. There is no guarding.  Neurological: He is alert and oriented to person, place, and time. He has normal strength.  Skin: He is not diaphoretic.    Labs: CBC:  Recent Labs Lab 02/26/16 1345  WBC 10.4  NEUTROABS 9.2*  HGB 9.1*  HCT 26.8*  MCV 85.9  PLT [id number]*   Metabolic Panel:  Recent Labs Lab 02/26/16 1345  02/26/16 2005 02/27/16 0118 02/27/16 0910 02/27/16 1244 02/27/16 1614  NA 135  < > 132* 132* 131* 131* 132*  K 4.1  < > 3.9 3.4* 3.4* 3.4* 3.1*  CL 110  < > 110 107 104 103 103  CO2 11*  < > 8* 10* 12* 12* 14*  GLUCOSE 70  < > 77 102* 109* 153* 144*  BUN 100*  < > 103* 102* 102* 101* 99*  CREATININE 9.20*  < > 9.43* 9.37*  9.30* 9.27* 8.59*  CALCIUM 8.2*  < > 7.7* 7.4* 7.1* 7.1* 7.1*  ALT 39  --   --   --   --   --   --   ALKPHOS 49  --   --   --   --   --   --   BILITOT 0.4  --   --   --   --   --   --   PROT 7.0  --   --   --   --   --   --   ALBUMIN 3.3*  --   --   --   --   --   --   LIPASE 41  --   --   --   --   --   --   < > = values in this interval not displayed. Cardiac Labs: BG:  Recent Labs Lab 02/27/16 0728 02/27/16 0837 02/27/16 0927 02/27/16 1200 02/27/16 1559  GLUCAP 95 128* 108* 172* 147*   Microbiology: MRSA positive  Negative Cdiff  Blood cultures drawn 8/10 negative <24 hrs on 8/11   Medications: Infusions: . dextrose 50 mL/hr at 02/27/16 1554  .  sodium bicarbonate  infusion 1000 mL 100 mL/hr at 02/27/16 1100   Scheduled Medications: . Chlorhexidine Gluconate Cloth  6 each Topical Q0600  . mupirocin ointment  1 application Nasal BID   PRN Medications: acetaminophen, ondansetron (ZOFRAN) IV  Assessment/Plan: Pt is a 50 y.o. yo male with a PMHx of ESRD, T2 DM, HTN, HLD who was admitted on 02/26/2016 with symptoms of N/V/D and weakness, which was determined to be secondary to hypoglycemia and metabolic acidosis. Interventions at this time will be focused on managing his CBG and acidosis.   Principal Problem:   Hypoglycemia Active Problems:   Diabetes mellitus type 2, controlled ([place])   Acute renal failure superimposed on stage 4 chronic kidney disease (H[facility])   Metabolic acidosis  Hypoglycemia, DM on glipizide 5g at home he was taking this med while he was not able to tolerate keeping down food for the last 4 days, also in the setting of renal insufficiency he may have had decreased sulfonourea clearance in general however sulfonylurea half life is 2-5 hours so unlikely it is still in his system at this point. His glucose has been trending upward 34 on admission 8/10 >> 147 this afternoon so D10 was weaned from 100 ml/hr to 50 ml/hr. Reports that his nausea and decreased  appetite have been worsening for years, at this point we question whether he has developed gastroparesis, he has no prior gastric emptying study.   On sodium bicarb D5 drip and D10 drip at 50 ml/hr   Started Zofran q 24 hrs prn nausea   [place] metabolic acidosis with non gap acidosis he has ESRD Stage 5 -BUN 100, SCr 9.2 found on admission 8/10, today BUN 99, SCr 8.59. Non gap acidosis is due to recent diarrhea. He recently had graft placement 1 month ago in anticipation of starting dialysis.  Receiving fluids with sodium bicarb drip  Continue BMET q 4 hours to monitor, anion gap is 15 at this point if his BUN does not improve he may require emergent hemodialysis.   Hypokalemia 3.4 this morning down to 3.1 this afternoon, repleted with 40 mg BID, will recheck tomorrow morning   Diarrhea has resolved at this time, etiology is not clear   HTN on Hydral and carvedilol at home, currently he is normotensive so we will hold home meds for now   HLD Lipid panel Chol 93, TAG 166, ASCVD can not be calculated because he has a normal total cholesterol today. Will hold home Pravastatin 40 qd for now   Dispo: Anticipated discharge in approximately 2-4 day(s).   LOS: 0 days   Ledell Noss, MD 02/27/2016, 5:03 PM Pager: [phone]

## 2016-02-27 NOTE — Progress Notes (Signed)
  Date: 02/27/2016  Patient name: Eric Glenn  Medical record number: [id number]  Date of birth: 05-27-1966   I have seen and evaluated Eric Glenn and discussed their care with the Residency Team. Eric Glenn is a 50 yo with DM II non insulin dependent with likely optho, neurologic, renal, and gastroparesis complications. Due to constipationon 8/7, he took one of his home lactulose and then had persistent watery D up to every 30 min. He also had N/V, anorexia, weakness, chills, and a HA. He had not been able to eat much during these sxs but did take his glipizide. On admission, he was found to be hypoglycemic, acidotic, and hypotensive. He has been tx with infusions of D 10 and bicarb.  PMHx, Fam Hx, and/or Soc Hx : spanish speaking. Fam hx sig for DM, Social hx - denies ETOH.  Vitals:   02/27/16 0700 02/27/16 1202  BP: 125/71 121/69  Pulse: 73 73  Resp: 17 16  Temp: 99.3 F (37.4 C) 98.6 F (37 C)   Alert and conversant. NAD HRRR no MRG LCTAB good air flow ABD + BS, soft, NT Ext no edema, warm Skin no abnl Neuro performed by Dr Hetty Ely - muscular strength 5/5 B upper and lower. Cerebellar nl.   I personally viewed his CXR images and confirmed by reading with the official read. Mild congestion, loss of L heart border  I personally viewed his EKG and confirmed by reading with the official read. Nl sinus, Nl axis, no ischemic chjanges  Assessment and Plan: I have seen and evaluated the patient as outlined above. I agree with the formulated Assessment and Plan as detailed in the residents' note, with the following changes: Eric Glenn is a 50 yo male with DM and diabetic complications and CKD with a baseline GFR of 18 but not yet on HD. Due to N/V/D and decreased PO intake, he bc hypoglycemic since he cont to take his sulfonylurea and also developed hypotension leading to acidosis and acute on chronic renal failure.  1. N/V/D/anorexia - he did  take one lactulose prior to onset of sxs although I doubt one dose would cause sxs for 3 days. He is C diff negative but could have a viral GI infection. Currently, the D and V have resolved although he cont to have anorexia and N. These last 2 sxs are chronic - going on for yrs likely representing gastroparesis. We will cont symptom,atic treatment and rec an outpt gastric emptying study.  2. Hypoglycemia - this is 2/2 decreased PO intake and presence of sulfonylurea which will be not be cleared quickly 2/2 renal failure. Cont IV dextrose and freq CBG checks. He will need to do CBG checks as an outpt to help determine need for home DM meds as A1C unreliable.  3. Hypotension - 2/2 vol contraction. Receiving IVF  4. Acute on chronic renal failure - GRF decreased from 18 to 7. Slight improvement since admission. K is nl, no vol overload. Had fistula placed as outpt in preparation for HD. No indication for emergent HD now.  5. Gap metabolic acidosis with non gap metabolic acidosis - from renal failure / hypoperfusion and GI loses respectively. ICU added sodium bicarb overnight. CO2 slowly coming up. If bicarb does not reach baseline of 22-24 will need to discuss with renal whether to add oral bicarb or to start HD.  Expect return to baseline in 24-48 hrs.  Bartholomew Crews, MD 8/11/20171:20 PM

## 2016-02-28 LAB — GLUCOSE, CAPILLARY
Glucose-Capillary: 130 mg/dL — ABNORMAL HIGH (ref 65–99)
Glucose-Capillary: 126 mg/dL — ABNORMAL HIGH (ref 65–99)
Glucose-Capillary: 149 mg/dL — ABNORMAL HIGH (ref 65–99)
Glucose-Capillary: 151 mg/dL — ABNORMAL HIGH (ref 65–99)
Glucose-Capillary: 137 mg/dL — ABNORMAL HIGH (ref 65–99)

## 2016-02-28 LAB — BASIC METABOLIC PANEL
Anion gap: 12 (ref 5–15)
Anion gap: 14 (ref 5–15)
Anion gap: 10 (ref 5–15)
BUN: 96 mg/dL — ABNORMAL HIGH (ref 6–20)
BUN: 94 mg/dL — ABNORMAL HIGH (ref 6–20)
BUN: 88 mg/dL — AB (ref 6–20)
CHLORIDE: 103 mmol/L (ref 101–111)
CHLORIDE: 101 mmol/L (ref 101–111)
CO2: 17 mmol/L — ABNORMAL LOW (ref 22–32)
CO2: 18 mmol/L — ABNORMAL LOW (ref 22–32)
CO2: 22 mmol/L (ref 22–32)
Calcium: 7.1 mg/dL — ABNORMAL LOW (ref 8.9–10.3)
Calcium: 7.4 mg/dL — ABNORMAL LOW (ref 8.9–10.3)
Calcium: 7.4 mg/dL — ABNORMAL LOW (ref 8.9–10.3)
Chloride: 102 mmol/L (ref 101–111)
Creatinine, Ser: 8.2 mg/dL — ABNORMAL HIGH (ref 0.61–1.24)
Creatinine, Ser: 8.23 mg/dL — ABNORMAL HIGH (ref 0.61–1.24)
Creatinine, Ser: 7.41 mg/dL — ABNORMAL HIGH (ref 0.61–1.24)
GFR calc Af Amer: 9 mL/min — ABNORMAL LOW (ref >60)
GFR calc non Af Amer: 7 mL/min — ABNORMAL LOW (ref >60)
GFR calc non Af Amer: 7 mL/min — ABNORMAL LOW (ref >60)
GFR calc non Af Amer: 8 mL/min — ABNORMAL LOW (ref >60)
GFR, EST AFRICAN AMERICAN: 8 mL/min — AB (ref >60)
GFR, EST AFRICAN AMERICAN: 8 mL/min — AB (ref >60)
GLUCOSE: 111 mg/dL — AB (ref 65–99)
Glucose, Bld: 119 mg/dL — ABNORMAL HIGH (ref 65–99)
Glucose, Bld: 151 mg/dL — ABNORMAL HIGH (ref 65–99)
POTASSIUM: 3.2 mmol/L — AB (ref 3.5–5.1)
Potassium: 3.1 mmol/L — ABNORMAL LOW (ref 3.5–5.1)
Potassium: 3.5 mmol/L (ref 3.5–5.1)
SODIUM: 134 mmol/L — AB (ref 135–145)
Sodium: 132 mmol/L — ABNORMAL LOW (ref 135–145)
Sodium: 133 mmol/L — ABNORMAL LOW (ref 135–145)

## 2016-02-28 MED ORDER — POTASSIUM CHLORIDE CRYS ER 20 MEQ PO TBCR
30.0000 meq | EXTENDED_RELEASE_TABLET | Freq: Once | ORAL | Status: DC
  Filled 2016-02-28: qty 1

## 2016-02-28 MED ORDER — SODIUM BICARBONATE 650 MG PO TABS
1300.0000 mg | ORAL_TABLET | Freq: Three times a day (TID) | ORAL | Status: DC
  Administered 2016-02-28 (×2): 1300 mg via ORAL
  Filled 2016-02-28 (×2): qty 2

## 2016-02-28 MED ORDER — SODIUM BICARBONATE 650 MG PO TABS
650.0000 mg | ORAL_TABLET | Freq: Three times a day (TID) | ORAL | Status: AC
  Administered 2016-02-29: 650 mg via ORAL
  Filled 2016-02-28: qty 1

## 2016-02-28 NOTE — Progress Notes (Signed)
MD paged at 2000 about fluid orders for this pt however page was not returned. This pt has ordered D10 @ 85ml/hr and bicarb in D5 @ 100 ml/hr and he is ESRD.  Also, blood sugars have been normal (even though there is no order to check blood sugars) they have been checked frequently.  Pt had 2 IV's but the hand became infiltrated and was removed at 0300; D10 is on hold.  If MD still wants it running in the morning, pt will need another IV. RN will continue to monitor.

## 2016-02-28 NOTE — Progress Notes (Signed)
Subjective: This morning, his sisters and niece were at bedside. He continues to report feeling better and is requesting something to eat. He does report some loose stool though is more formed and less liquid than at home. He also has swelling of the right hand where IV infiltrated overnight. His niece noted that he again had some epistaxis.   Objective:  Vital signs in last 24 hours: Vitals:   02/28/16 0350 02/28/16 0800 02/28/16 1200 02/28/16 1700  BP: (!) 181/79 (!) 180/81 (!) 182/67 (!) 158/74  Pulse: 74 73 73 65  Resp: 19 13 11    Temp: 98.8 F (37.1 C) 98.1 F (36.7 C) 99 F (37.2 C) 98.8 F (37.1 C)  TempSrc: Oral Oral Oral Oral  SpO2: 98% 97% (!) 84% 98%  Weight:      Height:       Physical Exam  Constitutional: He is oriented to person, place, and time. No distress.  HENT:  Head: Normocephalic and atraumatic.  Eyes: Conjunctivae are normal. No scleral icterus.  Pterygium bilaterally  Cardiovascular: Normal rate and regular rhythm.   Pulmonary/Chest: Effort normal. No respiratory distress (pcmh).  Musculoskeletal:  Right AV fistula with palpable thrill. Left hand swelling.  Neurological: He is alert and oriented to person, place, and time.  Skin: He is not diaphoretic.   Labs: CBC:  Recent Labs Lab 02/26/16 1345  WBC 10.4  NEUTROABS 9.2*  HGB 9.1*  HCT 26.8*  MCV 85.9  PLT [id number]*   Metabolic Panel:  Recent Labs Lab 02/26/16 1345  02/27/16 1614 02/27/16 2056 02/28/16 0013 02/28/16 0341 02/28/16 1307  NA 135  < > 132* 132* 132* 133* 134*  K 4.1  < > 3.1* 3.1* 3.1* 3.2* 3.5  CL 110  < > 103 103 103 101 102  CO2 11*  < > 14* 17* 17* 18* 22  GLUCOSE 70  < > 144* 122* 111* 119* 151*  BUN 100*  < > 99* 97* 96* 94* 88*  CREATININE 9.20*  < > 8.59* 8.34* 8.20* 8.23* 7.41*  CALCIUM 8.2*  < > 7.1* 7.2* 7.1* 7.4* 7.4*  MG  --   --   --  1.9  --   --   --   ALT 39  --   --   --   --   --   --   ALKPHOS 49  --   --   --   --   --   --   BILITOT 0.4  --    --   --   --   --   --   PROT 7.0  --   --   --   --   --   --   ALBUMIN 3.3*  --   --   --   --   --   --   LIPASE 41  --   --   --   --   --   --   < > = values in this interval not displayed. Cardiac Labs: BG:  Recent Labs Lab 02/27/16 2131 02/28/16 0340 02/28/16 0733 02/28/16 1316 02/28/16 1804  GLUCAP 119* 130* 126* 149* 151*   Microbiology: MRSA positive  Negative Cdiff  Blood cultures drawn 8/10 negative <24 hrs on 8/11   Medications: Infusions:   Scheduled Medications: . Chlorhexidine Gluconate Cloth  6 each Topical Q0600  . mupirocin ointment  1 application Nasal BID  . potassium chloride  30 mEq Oral Once  .  potassium chloride  40 mEq Oral BID  . [START ON 02/29/2016] sodium bicarbonate  650 mg Oral TID WC   PRN Medications: acetaminophen, ondansetron (ZOFRAN) IV  Assessment/Plan: Eric Glenn is a 50 y.o. yo male with ESRD, T2DM, HTN, HLD hospitalized on 02/26/2016 for hypoglycemia found to have anion gap metabolic acidosis 2/2 uremia and non-gap metabolic acidosis 2/2 diarrhea.  Principal Problem:   Hypoglycemia Active Problems:   Diabetes mellitus type 2, controlled ([place])   Acute renal failure superimposed on stage 4 chronic kidney disease (HCC)   Metabolic acidosis  Type 2 diabetes complicated by hypoglycemia: Secondary to decreased renal clearance, sulfonylurea therapy, poor oral intake. Improving today as evident by CBGs trending in the 100s.  -Start soft diet and stop D5/sodium bicarbonate drip and D10 drip -Give ondansetron 4mg  IV as needed for nausea -Decreased CBG checks to before meals and at bedtime  ESRD complicated by metabolic acidosis: Multifactorial from uremia and diarrhea. BUN remains elevated though improved from admission as does the diarrhea. -Encouraged oral intake as noted above -Contact nephrology to arrange for close follow-up following discharge -Check BMET tomorrow  Hypokalemia: Potassium 3.2 after multiple attempts  at oral supplementation. -Follow BMET above and will supplement cautiously given decreased renal clearance  HTN: BP trending 121 through 181/69-79. Home medications include hydralazine 100 mg twice daily and carvedilol 25 mg twice daily. -Holding medications in the setting of poor oral intake  Dispo: Anticipated discharge in approximately 1-3 day(s).   LOS: 1 day   Riccardo Dubin, MD 02/28/2016, 6:55 PM Pager: [phone]

## 2016-02-29 LAB — CBC
HCT: 21.4 % — ABNORMAL LOW (ref 39.0–52.0)
Hemoglobin: 7.3 g/dL — ABNORMAL LOW (ref 13.0–17.0)
MCH: 29 pg (ref 26.0–34.0)
MCHC: 34.1 g/dL (ref 30.0–36.0)
MCV: 84.9 fL (ref 78.0–100.0)
PLATELETS: 117 10*3/uL — AB (ref 150–400)
RBC: 2.52 MIL/uL — ABNORMAL LOW (ref 4.22–5.81)
RDW: 12.6 % (ref 11.5–15.5)
WBC: 5.8 10*3/uL (ref 4.0–10.5)

## 2016-02-29 LAB — BASIC METABOLIC PANEL
Anion gap: 12 (ref 5–15)
BUN: 81 mg/dL — AB (ref 6–20)
CALCIUM: 7.7 mg/dL — AB (ref 8.9–10.3)
CO2: 22 mmol/L (ref 22–32)
CREATININE: 6.34 mg/dL — AB (ref 0.61–1.24)
Chloride: 105 mmol/L (ref 101–111)
GFR calc Af Amer: 11 mL/min — ABNORMAL LOW (ref >60)
GFR calc non Af Amer: 9 mL/min — ABNORMAL LOW (ref >60)
Glucose, Bld: 96 mg/dL (ref 65–99)
Potassium: 4.6 mmol/L (ref 3.5–5.1)
Sodium: 139 mmol/L (ref 135–145)

## 2016-02-29 LAB — GLUCOSE, CAPILLARY: GLUCOSE-CAPILLARY: 130 mg/dL — AB (ref 65–99)

## 2016-02-29 MED ORDER — RELION CONFIRM GLUCOSE MONITOR W/DEVICE KIT: PACK | 0 refills | Status: DC

## 2016-02-29 MED ORDER — HYDRALAZINE HCL 50 MG PO TABS
100.0000 mg | ORAL_TABLET | Freq: Two times a day (BID) | ORAL | Status: DC
  Administered 2016-02-29: 100 mg via ORAL
  Filled 2016-02-29: qty 2

## 2016-02-29 MED ORDER — SODIUM BICARBONATE 650 MG PO TABS: 650.0000 mg | ORAL_TABLET | Freq: Three times a day (TID) | ORAL | 0 refills | Status: DC

## 2016-02-29 NOTE — Progress Notes (Signed)
Discussed discharge instructions and medications with Llasselen (patient's niece) per patient's request.  Patient lives with Llasselen.  All questions answered.

## 2016-02-29 NOTE — Discharge Summary (Signed)
Name: Eric Glenn MRN: [id number] DOB: 1966-05-07 50 y.o. PCP: Eric Morale, MD  Date of Admission: 02/26/2016 12:53 PM Date of Discharge: 02/29/2016 Attending Physician: Dr. Larey Glenn   Discharge Diagnosis: 1. Type 2 DM complicated by hypoglycemia   Secondary to decreased renal clearance, sulfonylurea therapy, poor oral intake   2. ESRD complicated by metabolic acidosis  Multifactorial from uremia and diarrhea  3. Hypertension  Hypotensive on admission, home medications were held with plan to restart at discharge   Principal Problem:   Hypoglycemia Active Problems:   Diabetes mellitus type 2, controlled ([place])   Acute renal failure superimposed on stage 4 chronic kidney disease (HCC)   Metabolic acidosis   Discharge Medications:   Medication List    STOP taking these medications   acetaminophen-codeine 300-30 MG tablet Commonly known as:  TYLENOL #3   atorvastatin 40 MG tablet Commonly known as:  LIPITOR   furosemide 80 MG tablet Commonly known as:  LASIX   glipiZIDE 5 MG tablet Commonly known as:  GLUCOTROL   glucose blood test strip Commonly known as:  TRUE METRIX BLOOD GLUCOSE TEST   lactulose 10 GM/15ML solution Commonly known as:  [place] Replaced by:  [place] w/Device Kit   TRUEPLUS LANCETS 28G Misc     TAKE these medications   albuterol 108 (90 Base) MCG/ACT inhaler Commonly known as:  PROVENTIL HFA;VENTOLIN HFA Inhale 2 puffs into the lungs every 6 (six) hours as needed for wheezing or shortness of breath.   carvedilol 25 MG tablet Commonly known as:  COREG TAKE 1 TABLET BY MOUTH TWICE DAILY WITH A MEAL   Fish Oil 1200 MG Caps Take 1,200 mg by mouth daily.   hydrALAZINE 100 MG tablet Commonly known as:  APRESOLINE Take 1 tablet (100 mg total) by mouth 2 (two) times daily. What changed:  Another medication with the same name was removed. Continue taking this medication,  and follow the directions you see here.   oxymetazoline 0.05 % nasal spray Commonly known as:  AFRIN Place 1-2 sprays into both nostrils daily as needed for congestion.   pravastatin 40 MG tablet Commonly known as:  PRAVACHOL Take 40 mg by mouth every evening.   RELION CONFIRM GLUCOSE MONITOR w/Device Kit Please use as directed. Replaces:  TRUE METRIX METER Devi   sodium bicarbonate 650 MG tablet Take 1 tablet (650 mg total) by mouth 3 (three) times daily with meals.       Disposition and follow-up:   [name] was discharged from [facility] in Good condition.  At the hospital follow up visit please address:  1. Hypoglycemia- is he monitoring CBG? If he has  Recorded hyperglycemic- provide insulin and reinforce risk of hypoglycemia   ESRD- stage 5 and s/p recent fistula surgery, awaiting maturation for dialysis. check cmet  Hypokalemia- check bmet   HTN/HLD- has he restarted home meds?   2.  Labs / imaging needed at time of follow-up: POC blood glucose,   3.  Pending labs/ test needing follow-up: none  Follow-up Appointments: Follow-up Information    Eric Kalata, MD. Go on 03/03/2016.   Specialty:  Nephrology Contact information: [place] [id number] [place] Follow up on 03/09/2016.   Why:  Hospital Follow up at  the [facility]  with Dr. Jarold Glenn 03/09/2016 at 12noon. Contact information: [address] [address] [phone]          Hospital Course by problem list: Principal Problem:   Hypoglycemia Active Problems:   Diabetes mellitus type 2, controlled ([place])   Acute renal failure superimposed on stage 4 chronic kidney disease (HCC)   Metabolic acidosis  1. Hypoglycemia  Mr.Eric Hernandez-Salinasis a 50 y.o.malewith a PMH of Stage 5 ESRD, T2DM, HTN, and HLDwho presents with nausea,  vomiting, watery diarrhea, and weakness. He has chronic constipation and was constipated on 8/7 so he took lactulose rx which he was prescribed for diarrhea. That evening he began experiencing nausea vomiting and watery diarrhea. He continued having diarrhea all of this week and 8/9 was found weak in bed and unable to get up. The morning of admission he was still weak and started to have chills and a headache. He tried eating toast and crackers this week but was not able to keep any food down without vomiting however he did continue taking his home medications which include Glipizide 5g. Denied SOB, blurred vision, dizziness. He has had no prior admissions for hypoglycemia.   In the ED he was found to have blood glucose of 34, Bicarb 11, BUN 100, SCr 9.2, Lactic acid 0.56, EKG was NSR with LV hypertrophy, and he was hypotensive. Blood cultures were drawn and he was given 2x 50 ml bolus of D 50 and started on a sodium bicarb with dextrose 100 ml/ hr. ABG showed anion gap metabolic acidosis with a non gap metabolic acidosis.   He was managed with IV D 10 drip and sodium bicarb. CGB trended upward and was  130 at discharge. He was given ondansetron as needed for nausea. Glipizide was discontinued and he was provided with a sample glucometer and instructed him to check his CBGs in the morning and before bedtime to decide if he needs insulin therapy or not  2. ESRD with anion gap metabolic acidosis with a non gap metabolic acidosis.  ABG ph=7.16, pco2=26.3, HCO3=9.4, anion-gap=18, lactic acid=0.56, Anion gap=13 on admission. Likely cause is uremia secondary to ESRD and Diarrhea with bicarb loss.  Treated with sodium bicarb drip. Held furosemide until he follows up and has reassessment of renal function (8/13) HGB 7.3, HCT 21.4, PLT 117 anemia may be related to ESRD.   3. Diarrhea Presented with 3 day history of watery, non- bloody diarrhea which resolved during the admission. Etiology was unclear. C. Diff  negative. He remained afebrile with no leukocytosis.   4. Chest pain  Vague chest pain symptoms on admission EKG showed NSR no ST changes. Troponins 0.04 with total resolution of chest pain the day after admission.    5. Hypokalemia: Multiple attempts at oral supplementation, remained cautious given decreased renal clearance. K 4.6 on the morning of discharge.   6. HTN Hypotensive on admission,  home medications- Hydral and carvedilol were held for admission but restarted at discharge.  7. HLD ASCVD can not be calculated because he has a normal total cholesterol. Home Pravastatin 40 qd was held.   Discharge Vitals:   BP (!) 192/84 (BP Location: Left Arm)   Pulse 69   Temp 97.5 F (36.4 C) (Oral)   Resp 18   Ht _0  (1.651 m)   Wt 195 lb (88.5 kg)   SpO2 99%   BMI 32.45 kg/m   Pertinent Labs, Studies, and Procedures:  CBG 8/13 - 130  cdiff - negative  Blood cultures drawn 8/10 - no growth 5 days  EKG- normal sinus rhythm  Plasma cortisol (8/11) 21.7  (8/13) HGB 7.3, HCT 21.4, PLT 117  (8/10) BUN 103, SCr 9.43, GFR 6  (8/13) BUN 81, SCr 6.34, GFR 9   Discharge Instructions: Discharge Instructions    Call MD for:  persistant nausea and vomiting    Complete by:  As directed   Call MD for:  severe uncontrolled pain    Complete by:  As directed   Call MD for:  temperature >100.4    Complete by:  As directed   Increase activity slowly    Complete by:  As directed      Signed: Ledell Noss, MD 02/29/2016, 7:39 PM   Pager: [phone]

## 2016-02-29 NOTE — Discharge Instructions (Signed)
Thank you for trusting [name] with your medical care!  You were hospitalized for low blood sugar and treated with IV fluids and medication.   Please check your blood sugars when he wakes up in the morning before eating and before going to bed at night. If you do not feel well, checked her sugar at this time. If your sugars are running over 200, please let us know so we can prescribe insulin.  START taking sodium bicarbonate one tablet with each meal to prevent acid from building up in your blood.  STOP furosemide until you see your kidney doctor on 8/16.  STOP lactulose for constipation.  To make sure you are getting better, please make it to the follow-up appointments listed on the first page.  If you have any questions, please call [phone].

## 2016-02-29 NOTE — Progress Notes (Signed)
Subjective: This morning, his brother and niece were at bedside. He was able to tolerate oral intake yesterday and this morning though didn't touch the eggs due to reflux. We explained to the patient that he is to remain off of any oral glycemic agents, like glipizide. He is interested in transferring his primary care to our clinic as well.  Objective:  Vital signs in last 24 hours: Vitals:   02/28/16 2158 02/29/16 0145 02/29/16 0519 02/29/16 0953  BP: (!) 168/81 (!) 161/74 (!) 167/80 (!) 192/84  Pulse: 74 69 67 69  Resp: 18 18 18 18   Temp: 98.4 F (36.9 C)  98.9 F (37.2 C) 97.5 F (36.4 C)  TempSrc: Oral  Oral Oral  SpO2: 99% 99% 98% 99%  Weight: 195 lb (88.5 kg)     Height: 5\' 5"  (1.651 m)      Physical Exam  Constitutional: He is oriented to person, place, and time. No distress.  HENT:  Head: Normocephalic and atraumatic.  Eyes: Conjunctivae are normal. No scleral icterus.  Pterygium bilaterally  Cardiovascular: Normal rate and regular rhythm.   Pulmonary/Chest: Effort normal. No respiratory distress.  Musculoskeletal:  Right AV fistula with palpable thrill.  Neurological: He is alert and oriented to person, place, and time.  Skin: He is not diaphoretic.   Labs: CBC:  Recent Labs Lab 02/26/16 1345 02/29/16 0454  WBC 10.4 5.8  NEUTROABS 9.2*  --   HGB 9.1* 7.3*  HCT 26.8* 21.4*  MCV 85.9 84.9  PLT [id number]* [id number]*   Metabolic Panel:  Recent Labs Lab 02/26/16 1345  02/27/16 2056 02/28/16 0013 02/28/16 0341 02/28/16 1307 02/29/16 0454  NA 135  < > 132* 132* 133* 134* 139  K 4.1  < > 3.1* 3.1* 3.2* 3.5 4.6  CL 110  < > 103 103 101 102 105  CO2 11*  < > 17* 17* 18* 22 22  GLUCOSE 70  < > 122* 111* 119* 151* 96  BUN 100*  < > 97* 96* 94* 88* 81*  CREATININE 9.20*  < > 8.34* 8.20* 8.23* 7.41* 6.34*  CALCIUM 8.2*  < > 7.2* 7.1* 7.4* 7.4* 7.7*  MG  --   --  1.9  --   --   --   --   ALT 39  --   --   --   --   --   --   ALKPHOS 49  --   --   --   --   --   --    BILITOT 0.4  --   --   --   --   --   --   PROT 7.0  --   --   --   --   --   --   ALBUMIN 3.3*  --   --   --   --   --   --   LIPASE 41  --   --   --   --   --   --   < > = values in this interval not displayed. Cardiac Labs: BG:  Recent Labs Lab 02/28/16 0733 02/28/16 1316 02/28/16 1804 02/28/16 2142 02/29/16 0142  GLUCAP 126* 149* 151* 137* 130*   Microbiology: MRSA positive  Negative Cdiff  Blood cultures drawn 8/10 negative <24 hrs on 8/11   Medications: Infusions:   Scheduled Medications: . hydrALAZINE  100 mg Oral BID   PRN Medications:   Assessment/Plan: Mr. Kope is a 50 y.o. yo  male with ESRD, T2DM, HTN, HLD hospitalized on 02/26/2016 for hypoglycemia found to have anion gap metabolic acidosis 2/2 uremia and non-gap metabolic acidosis 2/2 diarrhea.  Principal Problem:   Hypoglycemia Active Problems:   Diabetes mellitus type 2, controlled ([place])   Acute renal failure superimposed on stage 4 chronic kidney disease (HCC)   Metabolic acidosis  Type 2 diabetes complicated by hypoglycemia: Secondary to decreased renal clearance, sulfonylurea therapy, poor oral intake. Improving today as evident by CBGs trending in the 100s.  -Discontinued glipizide  -Give ondansetron 4mg  IV as needed for nausea -Provided with sample glucometer and instructed him to check his CBGs in the morning and before bedtime to decide if he needs insulin therapy or not  ESRD complicated by metabolic acidosis: Multifactorial from uremia and diarrhea but electrolytes and creatinine are improving. Per the daughter, he is scheduled for follow-up this Wednesday, 8/16. -Route discharge summary to his nephrologist's office -Hold furosemide until he follows up and has reassessment of renal function  Hypertension: BP trending 142-182/60s. Home medications include hydralazine 100 mg twice daily and carvedilol 25 mg twice daily. -Resume home medications at discharge  Dispo: Anticipated  discharge today.    LOS: 2 days   Riccardo Dubin, MD 02/29/2016, 1:43 PM Pager: [phone]

## 2016-02-29 NOTE — Progress Notes (Signed)
Patient's B/P is 161/74 and HR is 69 at 0145.  He is asymptomatic.  MD made aware.  No new orders.  Will continue to monitor patient.  Earleen Reaper RN-BC, [employer]

## 2016-02-29 NOTE — Care Management (Signed)
Patient is being discharged home today, patient is Medicaid pending and is active at the [facility] and uses the [facility] pharmacy . CM discussed transitional care needs with Wells Guiles RN on 6E. Patient is a ESRD not on dialysis DM, HTN, admitted with hypoglycemia, patient admits to not monitoring CBG patient is Spanish speaking only  CM will contact the [facility] . CM and diabetic coordinator to assist with the Self-Management of DM .  Patient does not own a glucometer, hospital  does not supply glucometer, but  affordable meter can be purchased at [employer]. No further CM needs identified.

## 2016-03-02 ENCOUNTER — Telehealth: Payer: Self-pay

## 2016-03-02 ENCOUNTER — Ambulatory Visit (HOSPITAL_COMMUNITY)
Admit: 2016-03-02 | Discharge: 2016-03-02 | Disposition: A | Discharge status: Home / Self Care | Payer: Self-pay | Source: Ambulatory Visit | Attending: Vascular Surgery | Admitting: Vascular Surgery

## 2016-03-02 DIAGNOSIS — N186 End stage renal disease: Secondary | ICD-10-CM | POA: Insufficient documentation

## 2016-03-02 DIAGNOSIS — Z4931 Encounter for adequacy testing for hemodialysis: Secondary | ICD-10-CM | POA: Insufficient documentation

## 2016-03-02 LAB — CULTURE, BLOOD (ROUTINE X 2): Culture: NO GROWTH

## 2016-03-02 NOTE — Telephone Encounter (Signed)
Message received from Laurena Slimmer, RN CM requesting [facility] Care Management  follow up with the patient. He has an appointment scheduled with Dr Jarold Song on 03/09/16.   Call placed to the patient  with the assistance of [place] Man # [phone] with [employer]. A HIPAA compliant voicemail message was left for the patient requesting a call back to # [phone].   Update provided to Wendi Maya, RN CM

## 2016-03-03 ENCOUNTER — Telehealth: Payer: Self-pay

## 2016-03-03 ENCOUNTER — Ambulatory Visit (INDEPENDENT_AMBULATORY_CARE_PROVIDER_SITE_OTHER): Payer: Self-pay | Admitting: Vascular Surgery

## 2016-03-03 ENCOUNTER — Encounter: Payer: Self-pay | Admitting: Vascular Surgery

## 2016-03-03 VITALS — BP 177/84 | HR 66 | Ht 65.0 in | Wt 198.0 lb

## 2016-03-03 DIAGNOSIS — N185 Chronic kidney disease, stage 5: Secondary | ICD-10-CM

## 2016-03-03 NOTE — Progress Notes (Signed)
   Patient name: Eric Glenn MRN: [id number] DOB: 11-12-1965 Sex: male  REASON FOR VISIT: Follow up of right brachiocephalic AV fistula.  HPI: Eric Glenn is a 50 y.o. male who had a right radiocephalic AV fistula which failed to mature adequately. I did not think they were anyways to salvage this and therefore on 01/22/2016, he underwent a right brachiocephalic AV fistula and ligation of the right radiocephalic AV fistula.  He comes in for a 6 week follow up visit. He has no specific complaints. He denies pain or paresthesias in his right arm.  Current Outpatient Prescriptions  Medication Sig Dispense Refill  . albuterol (PROVENTIL HFA;VENTOLIN HFA) 108 (90 Base) MCG/ACT inhaler Inhale 2 puffs into the lungs every 6 (six) hours as needed for wheezing or shortness of breath. (Patient not taking: Reported on 03/03/2016) 1 Inhaler 6  . Blood Glucose Monitoring Suppl (RELION CONFIRM GLUCOSE MONITOR) w/Device KIT Please use as directed. 1 kit 0  . carvedilol (COREG) 25 MG tablet TAKE 1 TABLET BY MOUTH TWICE DAILY WITH A MEAL 60 tablet 3  . hydrALAZINE (APRESOLINE) 100 MG tablet Take 1 tablet (100 mg total) by mouth 2 (two) times daily. (Patient not taking: Reported on 02/26/2016) 60 tablet 3  . Omega-3 Fatty Acids (FISH OIL) 1200 MG CAPS Take 1,200 mg by mouth daily.    Marland Kitchen oxymetazoline (AFRIN) 0.05 % nasal spray Place 1-2 sprays into both nostrils daily as needed for congestion.    . pravastatin (PRAVACHOL) 40 MG tablet Take 40 mg by mouth every evening.    . sodium bicarbonate 650 MG tablet Take 1 tablet (650 mg total) by mouth 3 (three) times daily with meals. 90 tablet 0   No current facility-administered medications for this visit.     REVIEW OF SYSTEMS:  '[X]'$  denotes positive finding, '[ ]'$  denotes negative finding Cardiac  Comments:  Chest pain or chest pressure:    Shortness of breath upon exertion: X   Short of breath when lying flat: X   Irregular heart rhythm:      Constitutional    Fever or chills:      PHYSICAL EXAM: Vitals:   03/03/16 0932 03/03/16 0933  BP: (!) 179/83 (!) 177/84  Pulse: 66   SpO2: 98%   Weight: 198 lb (89.8 kg)   Height: '5\' 5"'$  (1.651 m)     GENERAL: The patient is a well-nourished male, in no acute distress. The vital signs are documented above. CARDIOVASCULAR: There is a regular rate and rhythm. PULMONARY: There is good air exchange bilaterally without wheezing or rales. His right upper arm fistula has an excellent bruit and thrill. It is not pulsatile. He has a palpable right radial pulse.  DUPLEX AV FISTULA: I have independently interpreted the duplex of his AV fistula that was done yesterday. This shows that the diameters of the fistula range from 0.68-0.86 cm. Depths up here reasonable.  MEDICAL ISSUES:  STATUS POST RIGHT BRACHIOCEPHALIC AV FISTULA: This fistula is maturing nicely and should be ready to use in early October. Once we know the fistula is working well his catheter can be removed. I will see him back as needed.  HYPERTENSION: The patient's initial blood pressure today was elevated. We repeated this and this was still elevated. We have encouraged the patient to follow up with their primary care physician for management of their blood pressure.   [name] [facility] [phone]

## 2016-03-03 NOTE — Telephone Encounter (Signed)
A message was received from Laurena Slimmer, RN CM requesting care management follow-up for patient. This Case Manager placed call to patient with aide of [employer] ([place] interpreter [phone]).  Male answered phone and indicated patient unavailable. HIPPA compliant message left requesting patient return call to this Case Manager when able.

## 2016-03-09 ENCOUNTER — Inpatient Hospital Stay: Payer: Self-pay | Admitting: Family Medicine

## 2016-03-10 ENCOUNTER — Encounter: Payer: Self-pay | Admitting: Internal Medicine

## 2016-03-10 ENCOUNTER — Ambulatory Visit (INDEPENDENT_AMBULATORY_CARE_PROVIDER_SITE_OTHER): Payer: Self-pay | Admitting: Internal Medicine

## 2016-03-10 VITALS — BP 164/77 | HR 66 | Temp 98.0°F | Ht 65.0 in | Wt 194.1 lb

## 2016-03-10 DIAGNOSIS — E1122 Type 2 diabetes mellitus with diabetic chronic kidney disease: Secondary | ICD-10-CM

## 2016-03-10 DIAGNOSIS — N186 End stage renal disease: Secondary | ICD-10-CM | POA: Insufficient documentation

## 2016-03-10 DIAGNOSIS — E11319 Type 2 diabetes mellitus with unspecified diabetic retinopathy without macular edema: Secondary | ICD-10-CM

## 2016-03-10 DIAGNOSIS — N184 Chronic kidney disease, stage 4 (severe): Secondary | ICD-10-CM

## 2016-03-10 DIAGNOSIS — I10 Essential (primary) hypertension: Secondary | ICD-10-CM

## 2016-03-10 DIAGNOSIS — I120 Hypertensive chronic kidney disease with stage 5 chronic kidney disease or end stage renal disease: Secondary | ICD-10-CM

## 2016-03-10 HISTORY — DX: End stage renal disease: N18.6

## 2016-03-10 LAB — POCT GLYCOSYLATED HEMOGLOBIN (HGB A1C): HEMOGLOBIN A1C: 6

## 2016-03-10 LAB — GLUCOSE, CAPILLARY: Glucose-Capillary: 141 mg/dL — ABNORMAL HIGH (ref 65–99)

## 2016-03-10 NOTE — Assessment & Plan Note (Addendum)
Assessment: diabetes mellitus type 2 Patient was recently in the hospital on 02/26/2016 for hypoglycemic episode.  Prior to admission patient was taking glipizide 5 mg. This was held in the hospital and upon discharge. He was given a glucometer and states that his sugars have been around 105.  Today in office CBG was 141 and his A1C was 6.0.  Previous hemoglobin A1c in 12/09/2015 was 7.7. Patient denies any episodes of hypoglycemia since discharge.   Plan: -hemoglobin A1c is 6.0 and at goal.  Will not start any insulin products today. - Patient has follow-up with primary care physician on October 10.

## 2016-03-10 NOTE — Progress Notes (Signed)
   CC: hospital follow-up for hypoglycemia complicated by type 2 diabetes  HPI:  [name] is a 50 y.o.male with past medical history noted below who presents to [facility] for hospital follow-up for hypoglycemia. During his hospital admission on 02/26/2016 his glucose was 34. Glipizide was discontinued and he was provided a glucometer.  He was not on any more insulin product medications.  Patient does not have his glucometer today but states that he checks his blood sugar every morning once a day and it has been running around 105.  He is currently off the glipizide. Patient denies episodes of hypoglycemia since discharge.  He states that he has chronic nausea but denies vomiting or diarrhea since admission.  Past Medical History:  Diagnosis Date  . Anemia   . Chronic kidney disease (CKD), stage IV (severe) ([place])   . Diabetes mellitus without complication ([place])   . Diabetic retinopathy ([place])   . High cholesterol   . Hypertension     Review of Systems:  Review of Systems  Eyes:       Has chronic blurry vision from complications of diabetes  Respiratory: Negative for cough.   Gastrointestinal: Positive for nausea. Negative for abdominal pain and vomiting.  Genitourinary: Negative for dysuria and frequency.  Neurological: Positive for weakness. Negative for dizziness and headaches.     Physical Exam:  Vitals:   03/10/16 0909  BP: (!) 164/77  Pulse: 66  Temp: 98 F (36.7 C)  TempSrc: Oral  SpO2: 100%  Weight: 194 lb 1.6 oz (88 kg)  Height: 5\' 5"  (1.651 m)   Physical Exam  Constitutional: He is well-developed, well-nourished, and in no distress.  Eyes: Pupils are equal, round, and reactive to light.  Cardiovascular: Normal rate, regular rhythm and normal heart sounds.  Exam reveals no gallop and no friction rub.   No murmur heard. Pulmonary/Chest:  Clear to auscultation bilaterally with no rales rhonchi or wheezing. effort is normal and moving air well  Abdominal:  Soft. He exhibits no distension. There is no tenderness.  Musculoskeletal: He exhibits no edema.  Neurological:  CN III-XII intact Patient's vision is blurry from complications of diabetic retinopathy.  Can only see large letters at a close distance.  Peripheral vision intact Pterygium medial growth bilaterally, covering approximately 1/5 of each eye    Skin: Skin is warm and dry.  Fistula in right arm, awaiting maturation.  Area is non erythematous without open lesions.     Assessment & Plan:   See encounters tab for problem based medical decision making.   Patient seen with Dr. Evette Doffing

## 2016-03-10 NOTE — Assessment & Plan Note (Signed)
Assessment: End-stage renal disease Patient has a nephrology appointment today.  He had recent fistula surgery and awaiting maturation to start dialysis. Fistula is in the right arm and area appears well with no signs of infection.   Plan: -will hold on changing blood pressure medications due to potential start of dialysis which may help with blood pressure management.

## 2016-03-10 NOTE — Patient Instructions (Signed)
[name]  Please follow-up with your primary care doctor in 1 month I will call you with any changes to your medications.

## 2016-03-10 NOTE — Assessment & Plan Note (Addendum)
Assessment:  Essential hypertension In office blood pressure is 164/77. Patient is currently taking hydralazine 100 mg twice a day and carvedilol 25 mg twice daily. Furosemide was discontinued on previous hospital admission and patient is currently not taking. Patient is awaiting maturation of fistula to start hemodialysis. He has a nephrology appointment today.  He denies dizziness, recent vision changes or headaches.   Plan: -will hold on changing blood pressure medications to see when patient will start dialysis as this may help with blood pressure management. Would consider discontinuing hydralazine and carvedilol in the future if dialysis is controlling his blood pressure.

## 2016-03-11 NOTE — Progress Notes (Signed)
Internal Medicine Clinic Attending  I saw and evaluated the patient.  I personally confirmed the key portions of the history and exam documented by Dr. Hoffman and I reviewed pertinent patient test results.  The assessment, diagnosis, and plan were formulated together and I agree with the documentation in the resident's note.      

## 2016-03-12 ENCOUNTER — Other Ambulatory Visit: Payer: Self-pay

## 2016-03-12 DIAGNOSIS — I10 Essential (primary) hypertension: Secondary | ICD-10-CM

## 2016-03-12 MED ORDER — PRAVASTATIN SODIUM 40 MG PO TABS: 40.0000 mg | ORAL_TABLET | Freq: Every evening | ORAL | 5 refills | Status: DC

## 2016-03-12 MED ORDER — GLUCOSE BLOOD VI STRP: ORAL_STRIP | 12 refills | Status: DC

## 2016-03-12 NOTE — Telephone Encounter (Signed)
hydral just filled in July

## 2016-03-12 NOTE — Telephone Encounter (Signed)
Needs test strips; states uses True Metrix meter.

## 2016-03-12 NOTE — Telephone Encounter (Signed)
Requesting hydralazine, pravastatin and test strips to be filled @ [facility] on [address].

## 2016-03-22 ENCOUNTER — Other Ambulatory Visit: Payer: Self-pay

## 2016-03-22 ENCOUNTER — Observation Stay (HOSPITAL_COMMUNITY): Payer: Self-pay

## 2016-03-22 ENCOUNTER — Inpatient Hospital Stay (HOSPITAL_COMMUNITY)
Admission: EM | Admit: 2016-03-22 | Discharge: 2016-03-24 | DRG: 640 | Disposition: A | Discharge status: Home / Self Care | Payer: Self-pay | Attending: Student in an Organized Health Care Education/Training Program | Admitting: Student in an Organized Health Care Education/Training Program

## 2016-03-22 ENCOUNTER — Encounter (HOSPITAL_COMMUNITY): Payer: Self-pay

## 2016-03-22 ENCOUNTER — Emergency Department (HOSPITAL_COMMUNITY): Payer: Self-pay

## 2016-03-22 DIAGNOSIS — N184 Chronic kidney disease, stage 4 (severe): Secondary | ICD-10-CM | POA: Diagnosis present

## 2016-03-22 DIAGNOSIS — J45909 Unspecified asthma, uncomplicated: Secondary | ICD-10-CM | POA: Diagnosis present

## 2016-03-22 DIAGNOSIS — Z8249 Family history of ischemic heart disease and other diseases of the circulatory system: Secondary | ICD-10-CM

## 2016-03-22 DIAGNOSIS — N2581 Secondary hyperparathyroidism of renal origin: Secondary | ICD-10-CM | POA: Diagnosis present

## 2016-03-22 DIAGNOSIS — E8770 Fluid overload, unspecified: Secondary | ICD-10-CM

## 2016-03-22 DIAGNOSIS — E8779 Other fluid overload: Principal | ICD-10-CM | POA: Diagnosis present

## 2016-03-22 DIAGNOSIS — I10 Essential (primary) hypertension: Secondary | ICD-10-CM | POA: Diagnosis present

## 2016-03-22 DIAGNOSIS — E11319 Type 2 diabetes mellitus with unspecified diabetic retinopathy without macular edema: Secondary | ICD-10-CM | POA: Diagnosis present

## 2016-03-22 DIAGNOSIS — E7800 Pure hypercholesterolemia, unspecified: Secondary | ICD-10-CM | POA: Diagnosis present

## 2016-03-22 DIAGNOSIS — Z833 Family history of diabetes mellitus: Secondary | ICD-10-CM

## 2016-03-22 DIAGNOSIS — Z87891 Personal history of nicotine dependence: Secondary | ICD-10-CM

## 2016-03-22 DIAGNOSIS — I5032 Chronic diastolic (congestive) heart failure: Secondary | ICD-10-CM | POA: Diagnosis present

## 2016-03-22 DIAGNOSIS — E118 Type 2 diabetes mellitus with unspecified complications: Secondary | ICD-10-CM

## 2016-03-22 DIAGNOSIS — N186 End stage renal disease: Secondary | ICD-10-CM | POA: Diagnosis present

## 2016-03-22 DIAGNOSIS — R079 Chest pain, unspecified: Secondary | ICD-10-CM | POA: Diagnosis present

## 2016-03-22 DIAGNOSIS — D638 Anemia in other chronic diseases classified elsewhere: Secondary | ICD-10-CM | POA: Diagnosis present

## 2016-03-22 DIAGNOSIS — I120 Hypertensive chronic kidney disease with stage 5 chronic kidney disease or end stage renal disease: Secondary | ICD-10-CM

## 2016-03-22 DIAGNOSIS — N185 Chronic kidney disease, stage 5: Secondary | ICD-10-CM

## 2016-03-22 DIAGNOSIS — R0602 Shortness of breath: Secondary | ICD-10-CM

## 2016-03-22 DIAGNOSIS — E1122 Type 2 diabetes mellitus with diabetic chronic kidney disease: Secondary | ICD-10-CM | POA: Diagnosis present

## 2016-03-22 DIAGNOSIS — E785 Hyperlipidemia, unspecified: Secondary | ICD-10-CM | POA: Diagnosis present

## 2016-03-22 DIAGNOSIS — I132 Hypertensive heart and chronic kidney disease with heart failure and with stage 5 chronic kidney disease, or end stage renal disease: Secondary | ICD-10-CM | POA: Diagnosis present

## 2016-03-22 DIAGNOSIS — R0781 Pleurodynia: Secondary | ICD-10-CM

## 2016-03-22 DIAGNOSIS — K5900 Constipation, unspecified: Secondary | ICD-10-CM

## 2016-03-22 DIAGNOSIS — E1121 Type 2 diabetes mellitus with diabetic nephropathy: Secondary | ICD-10-CM | POA: Diagnosis present

## 2016-03-22 DIAGNOSIS — E1165 Type 2 diabetes mellitus with hyperglycemia: Secondary | ICD-10-CM

## 2016-03-22 DIAGNOSIS — E1169 Type 2 diabetes mellitus with other specified complication: Secondary | ICD-10-CM

## 2016-03-22 LAB — CBC
HCT: 24.7 % — ABNORMAL LOW (ref 39.0–52.0)
HEMOGLOBIN: 8.3 g/dL — AB (ref 13.0–17.0)
MCH: 28.9 pg (ref 26.0–34.0)
MCHC: 33.6 g/dL (ref 30.0–36.0)
MCV: 86.1 fL (ref 78.0–100.0)
PLATELETS: 136 10*3/uL — AB (ref 150–400)
RBC: 2.87 MIL/uL — AB (ref 4.22–5.81)
RDW: 12.3 % (ref 11.5–15.5)
WBC: 6.8 10*3/uL (ref 4.0–10.5)

## 2016-03-22 LAB — BASIC METABOLIC PANEL
ANION GAP: 6 (ref 5–15)
BUN: 48 mg/dL — ABNORMAL HIGH (ref 6–20)
CALCIUM: 8.7 mg/dL — AB (ref 8.9–10.3)
CO2: 23 mmol/L (ref 22–32)
CREATININE: 4.37 mg/dL — AB (ref 0.61–1.24)
Chloride: 110 mmol/L (ref 101–111)
GFR, EST AFRICAN AMERICAN: 17 mL/min — AB (ref >60)
GFR, EST NON AFRICAN AMERICAN: 14 mL/min — AB (ref >60)
GLUCOSE: 156 mg/dL — AB (ref 65–99)
Potassium: 4.7 mmol/L (ref 3.5–5.1)
Sodium: 139 mmol/L (ref 135–145)

## 2016-03-22 LAB — I-STAT TROPONIN, ED: TROPONIN I, POC: 0 ng/mL (ref 0.00–0.08)

## 2016-03-22 LAB — TROPONIN I: Troponin I: <0.03 ng/mL (ref <0.03)

## 2016-03-22 LAB — BRAIN NATRIURETIC PEPTIDE: B Natriuretic Peptide: 451.6 pg/mL — ABNORMAL HIGH (ref 0.0–100.0)

## 2016-03-22 LAB — D-DIMER, QUANTITATIVE: D-Dimer, Quant: 1.14 ug/mL-FEU — ABNORMAL HIGH (ref 0.00–0.50)

## 2016-03-22 MED ORDER — HYDRALAZINE HCL 50 MG PO TABS
100.0000 mg | ORAL_TABLET | Freq: Two times a day (BID) | ORAL | Status: DC
  Administered 2016-03-22 – 2016-03-24 (×4): 100 mg via ORAL
  Filled 2016-03-22 (×4): qty 2

## 2016-03-22 MED ORDER — POLYETHYLENE GLYCOL 3350 17 G PO PACK
17.0000 g | PACK | Freq: Once | ORAL | Status: AC
  Administered 2016-03-22: 17 g via ORAL
  Filled 2016-03-22: qty 1

## 2016-03-22 MED ORDER — TECHNETIUM TC 99M DIETHYLENETRIAME-PENTAACETIC ACID: 31.2000 | Freq: Once | INTRAVENOUS | Status: DC | PRN

## 2016-03-22 MED ORDER — OMEGA-3-ACID ETHYL ESTERS 1 G PO CAPS
1.0000 g | ORAL_CAPSULE | Freq: Every day | ORAL | Status: DC
  Administered 2016-03-22 – 2016-03-24 (×3): 1 g via ORAL
  Filled 2016-03-22 (×3): qty 1

## 2016-03-22 MED ORDER — ACETAMINOPHEN 325 MG PO TABS: 650.0000 mg | ORAL_TABLET | Freq: Four times a day (QID) | ORAL | Status: DC | PRN

## 2016-03-22 MED ORDER — HEPARIN SODIUM (PORCINE) 5000 UNIT/ML IJ SOLN
5000.0000 [IU] | Freq: Three times a day (TID) | INTRAMUSCULAR | Status: DC
  Administered 2016-03-22 – 2016-03-24 (×5): 5000 [IU] via SUBCUTANEOUS
  Filled 2016-03-22 (×4): qty 1

## 2016-03-22 MED ORDER — FUROSEMIDE 10 MG/ML IJ SOLN
40.0000 mg | Freq: Once | INTRAMUSCULAR | Status: AC
  Administered 2016-03-22: 40 mg via INTRAVENOUS
  Filled 2016-03-22: qty 4

## 2016-03-22 MED ORDER — SODIUM BICARBONATE 650 MG PO TABS
650.0000 mg | ORAL_TABLET | Freq: Three times a day (TID) | ORAL | Status: DC
  Administered 2016-03-23 – 2016-03-24 (×5): 650 mg via ORAL
  Filled 2016-03-22 (×4): qty 1

## 2016-03-22 MED ORDER — SODIUM CHLORIDE 0.9% FLUSH
3.0000 mL | Freq: Two times a day (BID) | INTRAVENOUS | Status: DC
  Administered 2016-03-22 – 2016-03-24 (×4): 3 mL via INTRAVENOUS

## 2016-03-22 MED ORDER — MORPHINE SULFATE (PF) 4 MG/ML IV SOLN
4.0000 mg | Freq: Once | INTRAVENOUS | Status: AC
  Administered 2016-03-22: 4 mg via INTRAVENOUS
  Filled 2016-03-22: qty 1

## 2016-03-22 MED ORDER — MOMETASONE FURO-FORMOTEROL FUM 200-5 MCG/ACT IN AERO: 2.0000 | INHALATION_SPRAY | Freq: Two times a day (BID) | RESPIRATORY_TRACT | Status: DC

## 2016-03-22 MED ORDER — PRAVASTATIN SODIUM 40 MG PO TABS
40.0000 mg | ORAL_TABLET | Freq: Every evening | ORAL | Status: DC
  Administered 2016-03-22 – 2016-03-23 (×2): 40 mg via ORAL
  Filled 2016-03-22 (×2): qty 1

## 2016-03-22 MED ORDER — ACETAMINOPHEN 650 MG RE SUPP: 650.0000 mg | Freq: Four times a day (QID) | RECTAL | Status: DC | PRN

## 2016-03-22 MED ORDER — ONDANSETRON HCL 4 MG/2ML IJ SOLN
4.0000 mg | Freq: Once | INTRAMUSCULAR | Status: AC
  Administered 2016-03-22: 4 mg via INTRAVENOUS
  Filled 2016-03-22: qty 2

## 2016-03-22 MED ORDER — TECHNETIUM TO 99M ALBUMIN AGGREGATED
3.0000 | Freq: Once | INTRAVENOUS | Status: AC | PRN
  Administered 2016-03-22: 3 via INTRAVENOUS

## 2016-03-22 MED ORDER — ASPIRIN 325 MG PO TABS
325.0000 mg | ORAL_TABLET | Freq: Once | ORAL | Status: AC
  Administered 2016-03-22: 325 mg via ORAL
  Filled 2016-03-22: qty 1

## 2016-03-22 MED ORDER — ALBUTEROL SULFATE (2.5 MG/3ML) 0.083% IN NEBU: 2.5000 mg | INHALATION_SOLUTION | Freq: Four times a day (QID) | RESPIRATORY_TRACT | Status: DC | PRN

## 2016-03-22 NOTE — ED Triage Notes (Signed)
Patient complains of chest pressure that started yesterday with no radiation. Nausea and shortness of breath with same. Pain seems to be worse with inspiration. Denies injury. Also concerned with constipation x 2 days

## 2016-03-22 NOTE — ED Provider Notes (Signed)
[facility] DEPT Provider Note   CSN: [id number] Arrival date & time: 03/22/16  1055   History   Chief Complaint Chief Complaint  Patient presents with  . Chest Pain    HPI Eric Glenn is a 50 y.o. male.  Patient with history of end-stage renal disease with maturing right upper extremity AV graft not yet on dialysis, hypertension, diabetes, high cholesterol, no history of heart disease -- presents with complaint of chest pain described as a pressure across his bilateral middle chest. It is waxing and waning in nature. No pain at the current time. Symptoms became worse last night. Pressure does not radiate. It is associated with shortness of breath, that is worse with exertion. No diaphoresis. Patient has had nausea but no vomiting. No exertional symptoms other than the shortness of breath. No lower or upper extremity swelling. No complaints regarding his right upper extremity AV graft. No fevers or cough. Aggravating factors: none. Alleviating factors: none.        Past Medical History:  Diagnosis Date  . Anemia   . Chronic kidney disease (CKD), stage IV (severe) ([place])   . Diabetes mellitus without complication ([place])   . Diabetic retinopathy ([place])   . High cholesterol   . Hypertension     Patient Active Problem List   Diagnosis Date Noted  . ESRD (end stage renal disease) ([place]) 03/10/2016  . Metabolic acidosis 98/33/8250  . Acute renal failure superimposed on stage 4 chronic kidney disease ([place]) 02/26/2016  . Hypoglycemia 02/26/2016  . Asthma 10/01/2015  . Hypersomnia 10/01/2015  . Cough 10/01/2015  . Obesity 10/01/2015  . Acute respiratory failure with hypoxia ([place]) 08/29/2015  . Orthopnea 08/29/2015  . Shortness of breath   . DOE (dyspnea on exertion) 08/28/2015  . Normocytic anemia 08/28/2015  . Suspected OSA  08/28/2015  . Constipation 08/28/2015  . Hyperlipidemia 08/19/2015  . Chronic kidney disease, stage IV (severe) ([place]) 08/19/2015  . Pedal  edema 08/19/2015  . Sinusitis, chronic 08/19/2015  . Essential hypertension 08/04/2015  . Chest pain 08/04/2015  . Diabetes mellitus type 2, controlled ([place]) 08/04/2015  . Diabetic retinopathy associated with type 2 diabetes mellitus ([place]) 08/04/2015  . Proteinuria 08/04/2015  . Leukocytosis 08/04/2015  . Acute renal insufficiency 08/03/2015    Past Surgical History:  Procedure Laterality Date  . AV FISTULA PLACEMENT Right 09/04/2015   Procedure: ARTERIOVENOUS (AV) FISTULA CREATION;  Surgeon: Angelia Mould, MD;  Location: [facility];  Service: Vascular;  Laterality: Right;  . AV FISTULA PLACEMENT Right 01/22/2016   Procedure: RIGHT UPPER ARM BRACHIOCEPHALIC ARTERIOVENOUS (AV) FISTULA CREATION;  Surgeon: Angelia Mould, MD;  Location: [facility];  Service: Vascular;  Laterality: Right;  . LIGATION OF ARTERIOVENOUS  FISTULA Right 01/22/2016   Procedure: LIGATION OF RIGHT FOREARM ARTERIOVENOUS  FISTULA;  Surgeon: Angelia Mould, MD;  Location: [facility];  Service: Vascular;  Laterality: Right;  . PERIPHERAL VASCULAR CATHETERIZATION N/A 01/12/2016   Procedure: Fistulagram;  Surgeon: Angelia Mould, MD;  Location: [facility];  Service: Cardiovascular;  Laterality: N/A;  . UMBILICAL HERNIA REPAIR         Home Medications    Prior to Admission medications   Medication Sig Start Date End Date Taking? Authorizing Provider  carvedilol (COREG) 25 MG tablet TAKE 1 TABLET BY MOUTH TWICE DAILY WITH A MEAL Patient taking differently: Take 25 mg by mouth 2 (two) times daily with a meal.  02/05/16  Yes Arnoldo Morale, MD  hydrALAZINE (APRESOLINE) 100 MG  tablet Take 1 tablet (100 mg total) by mouth 2 (two) times daily. 02/05/16  Yes Arnoldo Morale, MD  Omega-3 Fatty Acids (FISH OIL) 1200 MG CAPS Take 1,200 mg by mouth 2 (two) times daily.    Yes Historical Provider, MD  pravastatin (PRAVACHOL) 40 MG tablet Take 1 tablet (40 mg total) by mouth every evening. 03/12/16  Yes Bartholomew Crews, MD  sodium bicarbonate 650 MG tablet Take 1 tablet (650 mg total) by mouth 3 (three) times daily with meals. 02/29/16  Yes Riccardo Dubin, MD  albuterol (PROVENTIL HFA;VENTOLIN HFA) 108 (90 Base) MCG/ACT inhaler Inhale 2 puffs into the lungs every 6 (six) hours as needed for wheezing or shortness of breath. 12/09/15   Arnoldo Morale, MD  Blood Glucose Monitoring Suppl (RELION CONFIRM GLUCOSE MONITOR) w/Device KIT Please use as directed. Patient not taking: Reported on 03/22/2016 02/29/16   Riccardo Dubin, MD  glucose blood (TRUE METRIX BLOOD GLUCOSE TEST) test strip Use to check blood sugar daily. Code E 11.9. Patient not taking: Reported on 03/22/2016 03/12/16   Bartholomew Crews, MD    Family History Family History  Problem Relation Age of Onset  . Hypertension Mother   . Hyperlipidemia Mother   . Diabetes Mellitus II Sister     Social History Social History  Substance Use Topics  . Smoking status: Former Smoker    Quit date: 10/22/2003  . Smokeless tobacco: Never Used  . Alcohol use No     Allergies   Poractant alfa and Pork-derived products   Review of Systems Review of Systems  Constitutional: Negative for diaphoresis and fever.  Eyes: Negative for redness.  Respiratory: Positive for chest tightness and shortness of breath. Negative for cough and wheezing.   Cardiovascular: Positive for chest pain. Negative for palpitations and leg swelling.  Gastrointestinal: Positive for nausea. Negative for abdominal pain and vomiting.  Genitourinary: Negative for dysuria.  Musculoskeletal: Negative for back pain and neck pain.  Skin: Negative for rash.  Neurological: Negative for syncope and light-headedness.  Psychiatric/Behavioral: The patient is not nervous/anxious.      Physical Exam Updated Vital Signs BP 183/82   Pulse 72   Temp 98.4 F (36.9 C) (Oral)   Resp 12   Ht '5\' 4"'  (1.626 m)   Wt 89.8 kg   SpO2 99%   BMI 33.99 kg/m   Physical Exam  Constitutional: He  appears well-developed and well-nourished.  HENT:  Head: Normocephalic and atraumatic.  Mouth/Throat: Mucous membranes are normal. Mucous membranes are not dry.  Eyes: Conjunctivae are normal.  Neck: Trachea normal and normal range of motion. Neck supple. Normal carotid pulses and no JVD present. No muscular tenderness present. Carotid bruit is not present. No tracheal deviation present.  Cardiovascular: Normal rate, regular rhythm, S1 normal, S2 normal, normal heart sounds and intact distal pulses.  Exam reveals no distant heart sounds and no decreased pulses.   No murmur heard. Pulmonary/Chest: Effort normal and breath sounds normal. No respiratory distress. He has no wheezes. He exhibits no tenderness.  Abdominal: Soft. Normal aorta and bowel sounds are normal. There is no tenderness. There is no rebound and no guarding.  Musculoskeletal: He exhibits no edema.  Right upper extremity fistula, well-healing, palpable thrill. 2+ radial pulse.  Neurological: He is alert.  Skin: Skin is warm and dry. He is not diaphoretic. No cyanosis. No pallor.  Psychiatric: He has a normal mood and affect.  Nursing note and vitals reviewed.    ED  Treatments / Results  Labs (all labs ordered are listed, but only abnormal results are displayed) Labs Reviewed  BASIC METABOLIC PANEL - Abnormal; Notable for the following:       Result Value   Glucose, Bld 156 (*)    BUN 48 (*)    Creatinine, Ser 4.37 (*)    Calcium 8.7 (*)    GFR calc non Af Amer 14 (*)    GFR calc Af Amer 17 (*)    All other components within normal limits  CBC - Abnormal; Notable for the following:    RBC 2.87 (*)    Hemoglobin 8.3 (*)    HCT 24.7 (*)    Platelets 136 (*)    All other components within normal limits  D-DIMER, QUANTITATIVE (NOT AT [facility]) - Abnormal; Notable for the following:    D-Dimer, Quant 1.14 (*)    All other components within normal limits  I-STAT TROPOININ, ED    EKG  EKG  Interpretation  Date/Time:  Monday March 22 2016 11:24:23 EDT Ventricular Rate:  72 PR Interval:    QRS Duration: 98 QT Interval:  401 QTC Calculation: 439 R Axis:   23 Text Interpretation:  Sinus rhythm Baseline wander in lead(s) II III aVF Confirmed by Hazle Coca [id number]) on 03/22/2016 12:42:39 PM       Radiology Dg Chest 2 View  Result Date: 03/22/2016 CLINICAL DATA:  Chest pain and shortness of breath. EXAM: CHEST  2 VIEW COMPARISON:  02/26/2016 FINDINGS: The heart is upper limits of normal in size. Mild stable tortuosity of the thoracic aorta. Mild peribronchial thickening and increased interstitial markings suspicious for interstitial edema or interstitial pneumonitis. No definite pleural effusions or focal infiltrates. The bony thorax is intact. IMPRESSION: Borderline cardiac size and interstitial pulmonary edema versus interstitial pneumonitis. Electronically Signed   By: Marijo Sanes M.D.   On: 03/22/2016 13:39    Procedures Procedures (including critical care time)  Medications Ordered in ED Medications - No data to display   Initial Impression / Assessment and Plan / ED Course  I have reviewed the triage vital signs and the nursing notes.  Pertinent labs & imaging results that were available during my care of the patient were reviewed by me and considered in my medical decision making (see chart for details).  Clinical Course   Patient seen and examined. Work-up initiated.   Vital signs reviewed and are as follows: BP 181/84   Pulse 73   Temp 98.4 F (36.9 C) (Oral)   Resp 13   Ht '5\' 4"'  (1.626 m)   Wt 89.8 kg   SpO2 97%   BMI 33.99 kg/m   12:35 PM D-dimer is elevated. HEART=4. Awaiting CXR.   2:27 PM Pt stable. No further chest pain. D/w Dr. Ralene Bathe. Will admit for chest pain obs, V/Q ordered.   2:46 PM Spoke with Dr. Quay Burow, IMTS, who will admit.   3:54 PM Patient has been unable to tolerate V/Q, 2/2 chest pain and SOB. Morphine ordered.   Final  Clinical Impressions(s) / ED Diagnoses   Final diagnoses:  Chest pain, unspecified chest pain type  Shortness of breath   Admit.   New Prescriptions New Prescriptions   No medications on file        Carlisle Cater, PA-C 03/22/16 [place], MD 03/23/16 [phone]

## 2016-03-22 NOTE — H&P (Signed)
Date: 03/22/2016               Patient Name:  Eric Glenn MRN: [id number]  DOB: 09/05/1965 Age / Sex: 50 y.o., male   PCP: Ledell Noss, MD         Medical Service: Internal Medicine Teaching Service         Attending Physician: Dr. Axel Filler, MD    First Contact: Dr. Heber Aspers Pager: [phone]  Second Contact: Dr. Quay Burow Pager: [phone]       After Hours (After 5p/  First Contact Pager: [phone]  weekends / holidays): Second Contact Pager: [phone]   Chief Complaint: Chest pain  History of Present Illness: Eric Glenn is a 50 year old male with past medical history of chronic kidney disease stage IV, type II diabetes mellitus, hypertension and hyperlipidemia that presents with shortness of breath for the past 2 days which primarily occurs when he lays down or exerts himself. This has happened before and would resolve on its own, but this time it is not improving. Patient admits to increased orthopnea and has been sleeping on more pillows at night. He admits to chronic lower extremity edema in his feet for the past 2 months- improved- then returned a week ago and resolved 2 days ago. Patient's normal weight is 188 and weight today is 199. He denies fever or chills. He admits to chronic nausea without vomiting. He admits to lower extremity weakness and fatigue in his legs.  Patient admits to mild cough and chest pain. Chest pain started at the same time as the shortness of breath which he describes as substernal and worsens with deep inspiration. He denies radiation of pain. Pain comes and goes and is pressure like. It will range from a 5/10-7/10. Patient also gets short of breath, choked and will spit up when he is laying flat.   Patient denies history of long car rides or travel. He has not been out of the country recently. He denies pain in the back of his calves. His symptoms came on gradually. He denies hemoptysis. He is a previous smoker and drinker but quit  10 years ago. He denies a history of cancer. He denies lightheadedness. He denies history of blood clots or family history of blood clots.    Meds: Current Facility-Administered Medications  Medication Dose Route Frequency Provider Last Rate Last Dose  . morphine 4 MG/ML injection 4 mg  4 mg Intravenous Once Carlisle Cater, PA-C      . ondansetron [facility]) injection 4 mg  4 mg Intravenous Once Carlisle Cater, PA-C      . technetium TC 13M diethylenetriame-pentaacetic acid (DTPA) injection 07.2 millicurie  18.2 millicurie Intravenous Once PRN Annita Brod, MD       Current Outpatient Prescriptions  Medication Sig Dispense Refill  . carvedilol (COREG) 25 MG tablet TAKE 1 TABLET BY MOUTH TWICE DAILY WITH A MEAL (Patient taking differently: Take 25 mg by mouth 2 (two) times daily with a meal. ) 60 tablet 3  . hydrALAZINE (APRESOLINE) 100 MG tablet Take 1 tablet (100 mg total) by mouth 2 (two) times daily. 60 tablet 3  . Omega-3 Fatty Acids (FISH OIL) 1200 MG CAPS Take 1,200 mg by mouth 2 (two) times daily.     . pravastatin (PRAVACHOL) 40 MG tablet Take 1 tablet (40 mg total) by mouth every evening. 30 tablet 5  . sodium bicarbonate 650 MG tablet Take 1 tablet (650 mg total) by mouth 3 (three) times  daily with meals. 90 tablet 0  . albuterol (PROVENTIL HFA;VENTOLIN HFA) 108 (90 Base) MCG/ACT inhaler Inhale 2 puffs into the lungs every 6 (six) hours as needed for wheezing or shortness of breath. 1 Inhaler 6  . Blood Glucose Monitoring Suppl (RELION CONFIRM GLUCOSE MONITOR) w/Device KIT Please use as directed. (Patient not taking: Reported on 03/22/2016) 1 kit 0  . glucose blood (TRUE METRIX BLOOD GLUCOSE TEST) test strip Use to check blood sugar daily. Code E 11.9. (Patient not taking: Reported on 03/22/2016) 100 each 12    Allergies: Allergies as of 03/22/2016 - Review Complete 03/22/2016  Allergen Reaction Noted  . Poractant alfa Rash and Other (See Comments) 01/07/2016  . Pork-derived products  Rash and Other (See Comments) 08/03/2015   Past Medical History:  Diagnosis Date  . Anemia   . Chronic kidney disease (CKD), stage IV (severe) ([place])   . Diabetes mellitus without complication ([place])   . Diabetic retinopathy ([place])   . High cholesterol   . Hypertension     Family History:  Family History  Problem Relation Age of Onset  . Hypertension Mother   . Hyperlipidemia Mother   . Diabetes Mellitus II Sister     Social History:  Tobacco Use: Denies Alcohol Use: Denies Illicit Drug Use: Denies  Review of Systems: A complete ROS was negative except as per HPI.   Physical Exam: Blood pressure 192/87, pulse 69, temperature 98.4 F (36.9 C), temperature source Oral, resp. rate 13, height _0  (1.626 m), weight 198 lb (89.8 kg), SpO2 99 %. General: Vital signs reviewed.  Patient is well-developed and well-nourished, in no acute distress and cooperative with exam.  Head: Normocephalic and atraumatic. Eyes: EOMI, conjunctivae normal, no scleral icterus. Pterygium medial growth bilaterally, covering approximately 1/5 of each eye  Neck: Supple, trachea midline, normal ROM,  JVD noted on right  Cardiovascular: RRR, S1 normal, S2 normal, no murmurs, gallops, or rubs. Pulmonary/Chest: Crackles bilaterally in lower lung fields, no wheezes, rales, or rhonchi. No friction rub noted Abdominal: Soft, mild tenderness, non-distended, BS +, no masses, organomegaly, or guarding present.  Musculoskeletal: No joint deformities, erythema, or stiffness Extremities:  1+ extremity edema bilaterally, No cyanosis or clubbing. Neurological: A&O x3, sensory intact to light touch bilaterally.  Skin: Warm, dry and intact. No rashes or erythema. Psychiatric: Normal mood and affect. speech and behavior is normal. Cognition and memory are normal.   EKG: normal sinus rhythm with no ST elevations or T wave inversion  CXR:  FINDINGS: The heart is upper limits of normal in size. Mild stable tortuosity of  the thoracic aorta. Mild peribronchial thickening and increased interstitial markings suspicious for interstitial edema or interstitial pneumonitis. No definite pleural effusions or focal infiltrates. The bony thorax is intact.  IMPRESSION: Borderline cardiac size and interstitial pulmonary edema versus interstitial pneumonitis.  Assessment & Plan by Problem: Principal Problem:   Chest pain Active Problems:   Essential hypertension   Diabetes mellitus type 2, controlled ([place])   Diabetic retinopathy associated with type 2 diabetes mellitus ([facility])   Hyperlipidemia   Chronic kidney disease, stage IV (severe) ([facility])   Asthma  Chest pain Patient describes his chest pain as substernal and worse with inspiration. It does not radiate.   First set of troponins were negative at 0.0.  D-dimer was elevated at 1.14.  Wells score is 0. Patient had a nuclear medicine ventilation perfusion lung scan that noted no focal ventilation defect or perfusion defect. It suggested no evidence of  pulmonary embolism. During admission patient has not been hypoxic nor tachycardic.  Patient denies calf tenderness.  Not concerned for pulmonary embolism at this time.  I suspect chest pain is pleuritic in nature do to only being reproducible in inhalation and this is likely caused by being fluid overloaded from worsening CKD.  - Continue to trend troponins 2 - Aspirin 325    Volume overloaded Patient has ESRD. He has a fistula in his right arm awaiting maturation. Per daughter, outpatient nephrology did not see emergent need to start dialysis. Patient has chronic shortness of breath but has become more pronounced in the last 2 days especially when he lays flat. Per daughter patient has frothy white sputum when laying flat and had some when doing the V/Q scan earlier today.  Bibasilar crackles were noted bilaterally.  Fluid buildup in his lungs could help explain his chest pain when taking a deep breath.  Patient has had  lower extremity edema for the past 2 months that comes and goes. On exam patient has 1+ pitting edema bilaterally in lower extremities.  I am concerned that the patient is fluid overloaded due to his progression and worsening kidney disease. His GFR is 9 and creatinine is 4.37. This could also be new onset congestive heart failure although echo in January of this year showed 50-55% left ventricular ejection fraction. - Lasix 58m once - Echo  Hypertension BP is uncontrolled at 182/86. Home medications include carvedilol 25 mg two times daily. Hydralazine 100 mg 2 times daily. Patient has run out of his blood pressure medications in the last 3 days. -  Holding carvedilol as one thought is this could be acute on chronic congestive heart failure.  Will reassess after Echo - Continue hydralazine 100 mg BID  Constipation patient states he has not had a bowel movement in 2 days and feels constipated  - MiraLAX once    Dispo: Admit patient to Observation with expected length of stay less than 2 midnights.  Signed:

## 2016-03-23 ENCOUNTER — Other Ambulatory Visit (HOSPITAL_COMMUNITY): Payer: Self-pay

## 2016-03-23 ENCOUNTER — Other Ambulatory Visit (HOSPITAL_COMMUNITY): Payer: Self-pay | Admitting: *Deleted

## 2016-03-23 ENCOUNTER — Observation Stay (HOSPITAL_BASED_OUTPATIENT_CLINIC_OR_DEPARTMENT_OTHER): Payer: Self-pay

## 2016-03-23 DIAGNOSIS — I129 Hypertensive chronic kidney disease with stage 1 through stage 4 chronic kidney disease, or unspecified chronic kidney disease: Secondary | ICD-10-CM

## 2016-03-23 DIAGNOSIS — R079 Chest pain, unspecified: Secondary | ICD-10-CM | POA: Diagnosis present

## 2016-03-23 DIAGNOSIS — I319 Disease of pericardium, unspecified: Secondary | ICD-10-CM

## 2016-03-23 DIAGNOSIS — N184 Chronic kidney disease, stage 4 (severe): Secondary | ICD-10-CM

## 2016-03-23 LAB — BASIC METABOLIC PANEL
ANION GAP: 5 (ref 5–15)
BUN: 49 mg/dL — AB (ref 6–20)
CHLORIDE: 110 mmol/L (ref 101–111)
CO2: 25 mmol/L (ref 22–32)
Calcium: 8.7 mg/dL — ABNORMAL LOW (ref 8.9–10.3)
Creatinine, Ser: 4.63 mg/dL — ABNORMAL HIGH (ref 0.61–1.24)
GFR calc Af Amer: 16 mL/min — ABNORMAL LOW (ref >60)
GFR, EST NON AFRICAN AMERICAN: 13 mL/min — AB (ref >60)
Glucose, Bld: 89 mg/dL (ref 65–99)
POTASSIUM: 4.9 mmol/L (ref 3.5–5.1)
SODIUM: 140 mmol/L (ref 135–145)

## 2016-03-23 LAB — VITAMIN B12: VITAMIN B 12: 523 pg/mL (ref 180–914)

## 2016-03-23 LAB — ECHOCARDIOGRAM LIMITED
HEIGHTINCHES: 64 in
LV e' LATERAL: 9.9 cm/s
TDI e' lateral: 9.9
TDI e' medial: 6.53
WEIGHTICAEL: 3134.4 [oz_av]

## 2016-03-23 LAB — CBC
HEMATOCRIT: 23.8 % — AB (ref 39.0–52.0)
HEMOGLOBIN: 7.8 g/dL — AB (ref 13.0–17.0)
MCH: 28.5 pg (ref 26.0–34.0)
MCHC: 32.8 g/dL (ref 30.0–36.0)
MCV: 86.9 fL (ref 78.0–100.0)
Platelets: 143 10*3/uL — ABNORMAL LOW (ref 150–400)
RBC: 2.74 MIL/uL — ABNORMAL LOW (ref 4.22–5.81)
RDW: 12.3 % (ref 11.5–15.5)
WBC: 7.2 10*3/uL (ref 4.0–10.5)

## 2016-03-23 LAB — FERRITIN: FERRITIN: 131 ng/mL (ref 24–336)

## 2016-03-23 LAB — IRON AND TIBC
Iron: 37 ug/dL — ABNORMAL LOW (ref 45–182)
Saturation Ratios: 16 % — ABNORMAL LOW (ref 17.9–39.5)
TIBC: 238 ug/dL — ABNORMAL LOW (ref 250–450)
UIBC: 201 ug/dL

## 2016-03-23 LAB — TROPONIN I: Troponin I: <0.03 ng/mL (ref <0.03)

## 2016-03-23 MED ORDER — FUROSEMIDE 10 MG/ML IJ SOLN
40.0000 mg | Freq: Once | INTRAMUSCULAR | Status: AC
  Administered 2016-03-23: 40 mg via INTRAVENOUS
  Filled 2016-03-23: qty 4

## 2016-03-23 MED ORDER — POLYETHYLENE GLYCOL 3350 17 G PO PACK
17.0000 g | PACK | Freq: Every day | ORAL | Status: DC
  Administered 2016-03-23 – 2016-03-24 (×2): 17 g via ORAL
  Filled 2016-03-23 (×2): qty 1

## 2016-03-23 MED ORDER — FUROSEMIDE 80 MG PO TABS
80.0000 mg | ORAL_TABLET | Freq: Every day | ORAL | Status: DC
  Administered 2016-03-24: 80 mg via ORAL
  Filled 2016-03-23: qty 1

## 2016-03-23 NOTE — Progress Notes (Signed)
  Echocardiogram 2D Echocardiogram has been performed.  Eric Glenn 03/23/2016, 4:29 PM

## 2016-03-23 NOTE — Consult Note (Signed)
Reason for Consult: ESRD and dialysis timing Referring Physician: Dr Alonna Buckler is an 50 y.o. male.  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  We were asked to consult whether it was appropriate to initiate dialysis earlier due to his volume overload which could be from his ESRD vs exacerbation of his heart failure   PMH:   Past Medical History:  Diagnosis Date  . Anemia   . Chronic kidney disease (CKD), stage IV (severe) ([place])   . Diabetes mellitus without complication ([place])   . Diabetic retinopathy ([place])   . High cholesterol   . Hypertension     PSH:   Past Surgical History:  Procedure Laterality Date  . AV FISTULA PLACEMENT Right 09/04/2015   Procedure: ARTERIOVENOUS (AV) FISTULA CREATION;  Surgeon: Angelia Mould, MD;  Location: [facility];  Service: Vascular;  Laterality: Right;  . AV FISTULA PLACEMENT Right 01/22/2016   Procedure: RIGHT UPPER ARM BRACHIOCEPHALIC ARTERIOVENOUS (AV) FISTULA CREATION;  Surgeon: Angelia Mould, MD;  Location: [facility];  Service: Vascular;  Laterality: Right;  . LIGATION OF ARTERIOVENOUS  FISTULA Right 01/22/2016   Procedure: LIGATION OF RIGHT FOREARM ARTERIOVENOUS  FISTULA;  Surgeon: Angelia Mould, MD;  Location: [facility];  Service: Vascular;  Laterality: Right;  . PERIPHERAL VASCULAR CATHETERIZATION N/A 01/12/2016   Procedure: Fistulagram;  Surgeon: Angelia Mould, MD;  Location: [facility];  Service: Cardiovascular;  Laterality: N/A;  . UMBILICAL HERNIA REPAIR      Allergies:  Allergies  Allergen Reactions  . Poractant Alfa Rash and Other (See Comments)    Raises blood pressure  . Pork-Derived Products Rash and Other (See Comments)    Raises blood pressure    Medications:   Prior to Admission  medications   Medication Sig Start Date End Date Taking? Authorizing Provider  carvedilol (COREG) 25 MG tablet TAKE 1 TABLET BY MOUTH TWICE DAILY WITH A MEAL Patient taking differently: Take 25 mg by mouth 2 (two) times daily with a meal.  02/05/16  Yes Arnoldo Morale, MD  hydrALAZINE (APRESOLINE) 100 MG tablet Take 1 tablet (100 mg total) by mouth 2 (two) times daily. 02/05/16  Yes Arnoldo Morale, MD  Omega-3 Fatty Acids (FISH OIL) 1200 MG CAPS Take 1,200 mg by mouth 2 (two) times daily.    Yes Historical Provider, MD  pravastatin (PRAVACHOL) 40 MG tablet Take 1 tablet (40 mg total) by mouth every evening. 03/12/16  Yes Bartholomew Crews, MD  sodium bicarbonate 650 MG tablet Take 1 tablet (650 mg total) by mouth 3 (three) times daily with meals. 02/29/16  Yes Riccardo Dubin, MD  albuterol (PROVENTIL HFA;VENTOLIN HFA) 108 (90 Base) MCG/ACT inhaler Inhale 2 puffs into the lungs every 6 (six) hours as needed for wheezing or shortness of breath. 12/09/15   Arnoldo Morale, MD  Blood Glucose Monitoring Suppl (RELION CONFIRM GLUCOSE MONITOR) w/Device KIT Please use as directed. Patient not taking: Reported on 03/22/2016 02/29/16   Riccardo Dubin, MD  glucose blood (TRUE METRIX BLOOD GLUCOSE TEST) test strip Use to check blood sugar daily. Code E 11.9. Patient not taking: Reported on 03/22/2016 03/12/16   Bartholomew Crews, MD    Discontinued Meds:   Medications Discontinued During This Encounter  Medication Reason  . oxymetazoline (AFRIN) 0.05 % nasal spray  No longer needed (for PRN medications)  . technetium TC 4M diethylenetriame-pentaacetic acid (DTPA) injection 06.0 millicurie   . mometasone-formoterol (DULERA) 200-5 MCG/ACT inhaler 2 puff     Social History:  reports that he quit smoking about 12 years ago. He has never used smokeless tobacco. He reports that he does not drink alcohol or use drugs.  Family History:   Family History  Problem Relation Age of Onset  . Hypertension Mother   .  Hyperlipidemia Mother   . Diabetes Mellitus II Sister    Pertinent items noted in HPI and remainder of comprehensive ROS otherwise negative. Creat  Date/Time Value Ref Range Status  10/01/2015 11:08 AM 3.41 (H) 0.70 - 1.33 mg/dL Final  08/26/2015 03:57 PM 3.88 (H) 0.70 - 1.33 mg/dL Final   Creatinine, Ser  Date/Time Value Ref Range Status  03/23/2016 04:07 AM 4.63 (H) 0.61 - 1.24 mg/dL Final  03/22/2016 11:07 AM 4.37 (H) 0.61 - 1.24 mg/dL Final  02/29/2016 04:54 AM 6.34 (H) 0.61 - 1.24 mg/dL Final  02/28/2016 01:07 PM 7.41 (H) 0.61 - 1.24 mg/dL Final  02/28/2016 03:41 AM 8.23 (H) 0.61 - 1.24 mg/dL Final  02/28/2016 12:13 AM 8.20 (H) 0.61 - 1.24 mg/dL Final  02/27/2016 08:56 PM 8.34 (H) 0.61 - 1.24 mg/dL Final  02/27/2016 04:14 PM 8.59 (H) 0.61 - 1.24 mg/dL Final  02/27/2016 12:44 PM 9.27 (H) 0.61 - 1.24 mg/dL Final  02/27/2016 09:10 AM 9.30 (H) 0.61 - 1.24 mg/dL Final  02/27/2016 01:18 AM 9.37 (H) 0.61 - 1.24 mg/dL Final  02/26/2016 08:05 PM 9.43 (H) 0.61 - 1.24 mg/dL Final  02/26/2016 05:53 PM 9.03 (H) 0.61 - 1.24 mg/dL Final  02/26/2016 01:45 PM 9.20 (H) 0.61 - 1.24 mg/dL Final  01/12/2016 06:43 AM 5.20 (H) 0.61 - 1.24 mg/dL Final  12/08/2015 01:00 AM 4.18 (H) 0.61 - 1.24 mg/dL Final  08/30/2015 04:59 AM 4.35 (H) 0.61 - 1.24 mg/dL Final  08/29/2015 06:24 AM 4.35 (H) 0.61 - 1.24 mg/dL Final  08/28/2015 05:53 AM 4.10 (H) 0.61 - 1.24 mg/dL Final  08/28/2015 05:47 AM 4.06 (H) 0.61 - 1.24 mg/dL Final  08/06/2015 10:36 AM 3.24 (H) 0.61 - 1.24 mg/dL Final  08/05/2015 02:30 PM 3.52 (H) 0.61 - 1.24 mg/dL Final  08/05/2015 06:10 AM 3.40 (H) 0.61 - 1.24 mg/dL Final  08/04/2015 08:01 AM 3.24 (H) 0.61 - 1.24 mg/dL Final  08/04/2015 04:08 AM 3.29 (H) 0.61 - 1.24 mg/dL Final  08/03/2015 04:39 PM 3.27 (H) 0.61 - 1.24 mg/dL Final  05/15/2007 10:31 PM 0.7  Final    Recent Labs Lab 03/22/16 1107 03/23/16 0407  NA 139 140  K 4.7 4.9  CL 110 110  CO2 23 25  GLUCOSE 156* 89  BUN 48* 49*   CREATININE 4.37* 4.63*  CALCIUM 8.7* 8.7*    Recent Labs Lab 03/22/16 1107 03/23/16 0407  WBC 6.8 7.2  HGB 8.3* 7.8*  HCT 24.7* 23.8*  MCV 86.1 86.9  PLT 136* 143*   Liver Function Tests: No results for input(s): AST, ALT, ALKPHOS, BILITOT, PROT, ALBUMIN in the last 168 hours. No results for input(s): LIPASE, AMYLASE in the last 168 hours. No results for input(s): AMMONIA in the last 168 hours. Cardiac Enzymes:  Recent Labs Lab 03/22/16 1815 03/22/16 2333  TROPONINI <0.03 <0.03   Iron Studies: No results for input(s): IRON, TIBC, TRANSFERRIN, FERRITIN in the last 72 hours.  Results for orders placed or performed during the hospital encounter of 03/22/16 (from the past 48 hour(s))  Basic metabolic panel     Status: Abnormal   Collection Time: 03/22/16 11:07 AM  Result Value Ref Range   Sodium 139 135 - 145 mmol/L   Potassium 4.7 3.5 - 5.1 mmol/L   Chloride 110 101 - 111 mmol/L   CO2 23 22 - 32 mmol/L   Glucose, Bld 156 (H) 65 - 99 mg/dL   BUN 48 (H) 6 - 20 mg/dL   Creatinine, Ser 4.37 (H) 0.61 - 1.24 mg/dL   Calcium 8.7 (L) 8.9 - 10.3 mg/dL   GFR calc non Af Amer 14 (L) >60 mL/min   GFR calc Af Amer 17 (L) >60 mL/min    Comment: (NOTE) The eGFR has been calculated using the CKD EPI equation. This calculation has not been validated in all clinical situations. eGFR's persistently <60 mL/min signify possible Chronic Kidney Disease.    Anion gap 6 5 - 15  CBC     Status: Abnormal   Collection Time: 03/22/16 11:07 AM  Result Value Ref Range   WBC 6.8 4.0 - 10.5 K/uL   RBC 2.87 (L) 4.22 - 5.81 MIL/uL   Hemoglobin 8.3 (L) 13.0 - 17.0 g/dL   HCT 24.7 (L) 39.0 - 52.0 %   MCV 86.1 78.0 - 100.0 fL   MCH 28.9 26.0 - 34.0 pg   MCHC 33.6 30.0 - 36.0 g/dL   RDW 12.3 11.5 - 15.5 %   Platelets 136 (L) 150 - 400 K/uL  D-dimer, quantitative (not at [facility])     Status: Abnormal   Collection Time: 03/22/16 11:07 AM  Result Value Ref Range   D-Dimer, Quant 1.14 (H) 0.00 -  0.50 ug/mL-FEU    Comment: (NOTE) At the manufacturer cut-off of 0.50 ug/mL FEU, this assay has been documented to exclude PE with a sensitivity and negative predictive value of 97 to 99%.  At this time, this assay has not been approved by the FDA to exclude DVT/VTE. Results should be correlated with clinical presentation.   I-stat troponin, ED     Status: None   Collection Time: 03/22/16 11:27 AM  Result Value Ref Range   Troponin i, poc 0.00 0.00 - 0.08 ng/mL   Comment 3            Comment: Due to the release kinetics of cTnI, a negative result within the first hours of the onset of symptoms does not rule out myocardial infarction with certainty. If myocardial infarction is still suspected, repeat the test at appropriate intervals.   Brain natriuretic peptide     Status: Abnormal   Collection Time: 03/22/16  6:15 PM  Result Value Ref Range   B Natriuretic Peptide 451.6 (H) 0.0 - 100.0 pg/mL  Troponin I (q 6hr x 3)     Status: None   Collection Time: 03/22/16  6:15 PM  Result Value Ref Range   Troponin I <0.03 <0.03 ng/mL  Troponin I (q 6hr x 3)     Status: None   Collection Time: 03/22/16 11:33 PM  Result Value Ref Range   Troponin I <0.03 <0.03 ng/mL  Basic metabolic panel     Status: Abnormal   Collection Time: 03/23/16  4:07 AM  Result Value Ref Range   Sodium 140 135 - 145 mmol/L   Potassium 4.9 3.5 - 5.1 mmol/L   Chloride 110 101 - 111 mmol/L   CO2 25 22 - 32 mmol/L   Glucose, Bld 89 65 - 99 mg/dL   BUN 49 (H) 6 -  20 mg/dL   Creatinine, Ser 4.63 (H) 0.61 - 1.24 mg/dL   Calcium 8.7 (L) 8.9 - 10.3 mg/dL   GFR calc non Af Amer 13 (L) >60 mL/min   GFR calc Af Amer 16 (L) >60 mL/min    Comment: (NOTE) The eGFR has been calculated using the CKD EPI equation. This calculation has not been validated in all clinical situations. eGFR's persistently <60 mL/min signify possible Chronic Kidney Disease.    Anion gap 5 5 - 15  CBC     Status: Abnormal   Collection Time:  03/23/16  4:07 AM  Result Value Ref Range   WBC 7.2 4.0 - 10.5 K/uL   RBC 2.74 (L) 4.22 - 5.81 MIL/uL   Hemoglobin 7.8 (L) 13.0 - 17.0 g/dL   HCT 23.8 (L) 39.0 - 52.0 %   MCV 86.9 78.0 - 100.0 fL   MCH 28.5 26.0 - 34.0 pg   MCHC 32.8 30.0 - 36.0 g/dL   RDW 12.3 11.5 - 15.5 %   Platelets 143 (L) 150 - 400 K/uL    Dg Chest 2 View  Result Date: 03/22/2016 CLINICAL DATA:  Chest pain and shortness of breath. EXAM: CHEST  2 VIEW COMPARISON:  02/26/2016 FINDINGS: The heart is upper limits of normal in size. Mild stable tortuosity of the thoracic aorta. Mild peribronchial thickening and increased interstitial markings suspicious for interstitial edema or interstitial pneumonitis. No definite pleural effusions or focal infiltrates. The bony thorax is intact. IMPRESSION: Borderline cardiac size and interstitial pulmonary edema versus interstitial pneumonitis. Electronically Signed   By: Marijo Sanes M.D.   On: 03/22/2016 13:39   Nm Pulmonary Perf And Vent  Result Date: 03/22/2016 CLINICAL DATA:  Chronic shortness of breath. Chest pain. Dialysis patient. EXAM: NUCLEAR MEDICINE VENTILATION - PERFUSION LUNG SCAN TECHNIQUE: Ventilation images were obtained in multiple projections using inhaled aerosol Tc-15mDTPA. Perfusion images were obtained in multiple projections after intravenous injection of Tc-926mAA. RADIOPHARMACEUTICALS:  31.2 mCi Technetium-9925mPA aerosol inhalation and 3.1 mCi Technetium-34m52m IV COMPARISON:  CT chest 08/29/2015.  PA and lateral chest today. FINDINGS: Ventilation: No focal ventilation defect. The patient had difficulty cooperating for the examination resulting in a somewhat limited study. No lateral images are provided. Perfusion: No wedge shaped peripheral perfusion defects to suggest acute pulmonary embolism. IMPRESSION: Somewhat limited study without evidence of pulmonary embolus. No acute finding. Electronically Signed   By: ThomInge Rise.   On: 03/22/2016 15:59      Blood pressure (!) 166/77, pulse 66, temperature 98.7 F (37.1 C), temperature source Oral, resp. rate 20, height _0  (1.626 m), weight 195 lb 14.4 oz (88.9 kg), SpO2 96 %.   General:  A&O, in NAD.  CV: Regular rate, regular rhythm, no murmurs or rubs appreciated. Resp: equal and symmetric breath sounds, bibasilar crackles appreciated Abdomen: soft, nontender, nondistended, +BS in all 4 quadrants Skin: warm, dry, intact Extremities: pulses intact b/l,  1+ pitting edema to the knees                Right hand is more edematous than left. Right arm has two AV fistulas- the most recent one being brachiocephalic and it has good thrill  Neurologic: Patient is alert and oriented x3,                 No asterixis on exam. No signs of uremia.      Assessment/Plan: 1.  CKD Stage 5, not on dialysis yet : Patient's creatinine  was around 3.5 in March 2017, and it peaked at around 9.5 on August 10 before trending down. His creatinine is slowly trending down and is currently 4.63 currently with GFR of 13. Patient's electrolytes are stable, and he has no signs of uremia, or asterixis that necessitates an early/urgent dialysis. His AV fistula also has not matured yet. We do not recommend dialysis currently given that his creatinine is improving and his fistula has not matured yet.  1. Patient has only early morning nausea and is without any other signs of uremia such as anorexia, vomiting, pruritis, asterixis, hyperkalemia, or diuretic resistant volume overload.  His Scr has actually improved over the last month.  He is scheduled to follow up with Dr. Justin Mend on 04/13/16.  No indication for dialysis at this time. 2. HTN with volume overload: Presented with blood pressure of 182/86 as Patient ran out of one of his antiHTn for 3 days, which may have exacerbated his possible underlying heart failure. He is on coreg 25 mg BID and hydralazine 100 mg BID. His lasix was stopped at his prior hospitalization. Given  his GFR of less than 30, pt needs to be on daily lasix, for HTN and volume overload.  Echo pending 1. His urgent HTN may have contributed to his volume overload as he ran out of a medication and had a markedly elevated BP upon admission.  Much improved and recommend initiation of lasix 40-80 mg po daily and f/u with Dr. Justin Mend to follow labs. 3. T2DM: glipizide stopped during last hospitalization due to hypoglycemia- Last A1c of 6- but it is not reliable in a CKD5 patient. Recommend adjusting therapy based on actual CBG numbers 4. Anemia of chronic disease: Iron last checked in February and was low. Checking Ferritin, iron and TIBC, with B12 and folate. Pt is getting weekly Procrit injections (5000 units sq qweek to start 03/24/16 at short stay). Recommend a dose of IV Iron 5. Secondary hyperparathyroidism: checking PTH 6. Vascular access: 1st fistula in February 2017, but had to be converted to right brachiocephalic in July 6701. The fistula has good thrill, but need to wait at atleast another 4 weeks before the fistula can be used for dialysis  Thank you for the consult  Signed  Burgess Estelle MD PGY2 Internal Medicine Nephrology Service 03/23/2016, 3:50 PM   I have seen and examined this patient and agree with plan and assessment in the above note with renal recommendations/intervention highlighted. Timothy A Larose Batres,MD 03/23/2016 9:03 PM

## 2016-03-23 NOTE — Progress Notes (Signed)
Interpreter Lesle Chris for echocardiogram

## 2016-03-23 NOTE — Progress Notes (Signed)
Subjective: Patient was evaluated this morning on rounds. He was resting comfortably reclined had a 60 angle. He states he no longer has shortness of breath while lying down and very mild chest pain on deep inspiration. He reports having to urinate more frequently due to the diuretic. He states he hasn't had a bowel movement yet and still feels constipated.  Objective:  Vital signs in last 24 hours: Vitals:   03/22/16 1615 03/22/16 1649 03/22/16 2021 03/23/16 0440  BP: 182/86 (!) 175/84 (!) 169/80 (!) 166/77  Pulse: 74 78 70 66  Resp: 14  20 20   Temp:  98.4 F (36.9 C) 98 F (36.7 C) 98.7 F (37.1 C)  TempSrc:  Oral Oral Oral  SpO2: 93% 93% 96% 96%  Weight:  199 lb 1.6 oz (90.3 kg)  195 lb 14.4 oz (88.9 kg)  Height:  5\' 4"  (1.626 m)     Physical Exam  Constitutional: He is well-developed, well-nourished, and in no distress.  Eyes:  Pterygium medial growth bilaterally, covering approximately 1/5 of each   Cardiovascular: Normal rate, regular rhythm and normal heart sounds.   Pulmonary/Chest:  Crackles still present bilaterally in lower lung fields  Abdominal: There is tenderness.  Musculoskeletal:  1+ pitting edema bilaterally in lower extremities  Skin: Skin is warm and dry.  Psychiatric: Affect normal.     Assessment/Plan:  Principal Problem:   Chest pain Active Problems:   Essential hypertension   Diabetes mellitus type 2, controlled ([place])   Diabetic retinopathy associated with type 2 diabetes mellitus ([facility])   Hyperlipidemia   Chronic kidney disease, stage IV (severe) ([facility])   Asthma  Chest pain Patient no longer feels short of breath especially when laying flat. He has very mild chest pain when he takes a deep breath but reports that it's almost resolved. He was given Lasix 40 mg once yesterday. 2 sets of troponins have come back negative. The results of the echo are still pending.  At this point I do not think that the chest pain is due to acute myocardial  ischemia and more likely pleuritic pain from being fluid overloaded. - Awaiting echo results  Volume overloaded  Patient states that his shortness of breath has resolved and is able to lay in a reclined position without difficulty breathing. Patient continues to have crackles in his lower lung fields and 1+ pitting edema in lower extremities bilaterally.  His weight in the hospital has decreased from 199 to 195lbs. I think that his shortness of breath and lower extremity edema is due to worsening of his chronic kidney disease.  He reports that he is supposed to start dialysis in about 1 month. Patient has a fistula in his right arm that began forming 6 weeks ago. Patient has been having chronic nausea and a generalized feeling of weakness for the past couple months. Patient may benefit from starting dialysis sooner. And until then patient may benefit from lasix daily outpatient as a diuretic is not part of his home medications.  -Continue Lasix 40 mg once -Consult nephrology for possibly starting dialysis inpatient  - Echo results pending  Hypertension BP is uncontrolled at 166/77 although improved from yesterday at 182/86. Home medications include carvedilol 25 mg two times daily. Hydralazine 100 mg 2 times daily. Patient has run out of his blood pressure medications in the last 3 days. -   continuing to hold carvedilol until echo results come back - Continue hydralazine 100 mg BID  Constipation patient states he  has not had a bowel movement after a dose of MiraLAX and continues to feel constipated - Continue MiraLAX once  Dispo: Anticipated discharge in approximately 2 day(s).   Valinda Party, DO 03/23/2016, 11:13 AM Pager: [phone]

## 2016-03-23 NOTE — Progress Notes (Signed)
Patient sitting up in chair, family member present in room. No needs at this time. Call light within reach

## 2016-03-24 ENCOUNTER — Encounter (HOSPITAL_COMMUNITY): Payer: Self-pay

## 2016-03-24 DIAGNOSIS — N186 End stage renal disease: Secondary | ICD-10-CM

## 2016-03-24 DIAGNOSIS — E1122 Type 2 diabetes mellitus with diabetic chronic kidney disease: Secondary | ICD-10-CM

## 2016-03-24 DIAGNOSIS — R0602 Shortness of breath: Secondary | ICD-10-CM

## 2016-03-24 DIAGNOSIS — Z992 Dependence on renal dialysis: Secondary | ICD-10-CM

## 2016-03-24 LAB — RENAL FUNCTION PANEL
ANION GAP: 8 (ref 5–15)
Albumin: 3.4 g/dL — ABNORMAL LOW (ref 3.5–5.0)
BUN: 52 mg/dL — ABNORMAL HIGH (ref 6–20)
CO2: 24 mmol/L (ref 22–32)
Calcium: 8.8 mg/dL — ABNORMAL LOW (ref 8.9–10.3)
Chloride: 107 mmol/L (ref 101–111)
Creatinine, Ser: 4.83 mg/dL — ABNORMAL HIGH (ref 0.61–1.24)
GFR calc non Af Amer: 13 mL/min — ABNORMAL LOW (ref >60)
GFR, EST AFRICAN AMERICAN: 15 mL/min — AB (ref >60)
GLUCOSE: 110 mg/dL — AB (ref 65–99)
POTASSIUM: 4.7 mmol/L (ref 3.5–5.1)
Phosphorus: 5.2 mg/dL — ABNORMAL HIGH (ref 2.5–4.6)
Sodium: 139 mmol/L (ref 135–145)

## 2016-03-24 LAB — FOLATE RBC
Folate, Hemolysate: 368 ng/mL
Folate, RBC: 1443 ng/mL (ref >498)
Hematocrit: 25.5 % — ABNORMAL LOW (ref 37.5–51.0)

## 2016-03-24 LAB — PARATHYROID HORMONE, INTACT (NO CA): PTH: 149 pg/mL — ABNORMAL HIGH (ref 15–65)

## 2016-03-24 LAB — CBC
HEMATOCRIT: 25.1 % — AB (ref 39.0–52.0)
HEMOGLOBIN: 8.1 g/dL — AB (ref 13.0–17.0)
MCH: 28.3 pg (ref 26.0–34.0)
MCHC: 32.3 g/dL (ref 30.0–36.0)
MCV: 87.8 fL (ref 78.0–100.0)
Platelets: 140 10*3/uL — ABNORMAL LOW (ref 150–400)
RBC: 2.86 MIL/uL — ABNORMAL LOW (ref 4.22–5.81)
RDW: 12.2 % (ref 11.5–15.5)
WBC: 5.9 10*3/uL (ref 4.0–10.5)

## 2016-03-24 MED ORDER — CARVEDILOL 25 MG PO TABS: 25.0000 mg | ORAL_TABLET | Freq: Once | ORAL | Status: DC

## 2016-03-24 MED ORDER — SODIUM CHLORIDE 0.9 % IV SOLN
510.0000 mg | Freq: Once | INTRAVENOUS | Status: AC
  Administered 2016-03-24: 510 mg via INTRAVENOUS
  Filled 2016-03-24: qty 17

## 2016-03-24 MED ORDER — EPOETIN ALFA 10000 UNIT/ML IJ SOLN: 5000.0000 [IU] | INTRAMUSCULAR | Status: DC

## 2016-03-24 MED ORDER — CARVEDILOL 25 MG PO TABS: ORAL_TABLET | ORAL | 3 refills | Status: DC

## 2016-03-24 MED ORDER — HYDRALAZINE HCL 100 MG PO TABS: 100.0000 mg | ORAL_TABLET | Freq: Two times a day (BID) | ORAL | 3 refills | Status: DC

## 2016-03-24 MED ORDER — FUROSEMIDE 40 MG PO TABS: 40.0000 mg | ORAL_TABLET | Freq: Two times a day (BID) | ORAL | 1 refills | Status: DC

## 2016-03-24 MED ORDER — EPOETIN ALFA 10000 UNIT/ML IJ SOLN: 5000.0000 [IU] | INTRAMUSCULAR | 0 refills | Status: DC

## 2016-03-24 MED ORDER — DARBEPOETIN ALFA 40 MCG/0.4ML IJ SOSY
40.0000 ug | PREFILLED_SYRINGE | Freq: Once | INTRAMUSCULAR | Status: AC
  Administered 2016-03-24: 40 ug via SUBCUTANEOUS
  Filled 2016-03-24: qty 0.4

## 2016-03-24 NOTE — Progress Notes (Addendum)
SATURATION QUALIFICATIONS: (This note is used to comply with regulatory documentation for home oxygen)  Patient Saturations on Room Air at Rest = 98%  Patient Saturations on Room Air while Ambulating = 96%   

## 2016-03-24 NOTE — Progress Notes (Signed)
   Subjective: Patient was evaluated this morning on rounds. He was sitting comfortably in a chair and denies any shortness of breath or chest pain today. Patient states that he feels he is at his baseline for breathing and general health.  Objective:  Vital signs in last 24 hours: Vitals:   03/22/16 2021 03/23/16 0440 03/23/16 2120 03/24/16 0347  BP: (!) 169/80 (!) 166/77 (!) 178/80 (!) 176/82  Pulse: 70 66 65 65  Resp: 20 20 18 18   Temp: 98 F (36.7 C) 98.7 F (37.1 C) 99 F (37.2 C) 98.2 F (36.8 C)  TempSrc: Oral Oral Oral Oral  SpO2: 96% 96% 99% 98%  Weight:  195 lb 14.4 oz (88.9 kg)  194 lb 6.4 oz (88.2 kg)  Height:       Physical Exam  Eyes:  Pterygium medial growth bilaterally, covering approximately 1/5 of each eye  Cardiovascular: Normal rate, regular rhythm and normal heart sounds.  Exam reveals no gallop and no friction rub.   No murmur heard. Pulmonary/Chest: Effort normal and breath sounds normal. No respiratory distress. He has no wheezes. He has no rales.  No crackles noted bilaterally   Abdominal: Soft.  Musculoskeletal:  No pitting edema noted bilaterally in lower extremities  Skin: Skin is warm and dry.     Assessment/Plan:  Principal Problem:   Pain in the chest Active Problems:   Essential hypertension   Diabetes mellitus type 2, controlled ([place])   Diabetic retinopathy associated with type 2 diabetes mellitus ([facility])   Hyperlipidemia   Chronic kidney disease, stage IV (severe) ([facility])   Asthma   Chest pain  Volume overload due to ESRD Patient denies shortness of breath and is able to lay in a reclined position on his back without difficulty breathing. No crackles were noted on lung exam or pitting edema in lower extremities bilaterally. Weight is currently 194lb down from 199lb on admission.  Echo showed left ventricular ejection fraction 55-60% and grade 2 diastolic dysfunction. This helps confirm the patient is not having an acute heart failure  exacerbation. Shortness of breath likely due to ESRD, not yet on dialysis. Nephrology was consulted and did not recommend dialysis at this time as his fistula has not matured and may require 4 more weeks before starting dialysis. At this point we will continue Lasix at 40 mg twice a day to help with volume status. Nephrology obtained iron levels (37) and recommended IV iron infusion which he will receive today. Patient also receives Aranesp for his chronic renal failure and will receive that today.  Nurse ambulated patient earlier today and oxygen saturation was at 96% on room air. -  Discharge to home  - [place] with Lasix 40 BID - Nephrology follow up 9/26 [facility] follow up on 9/11   Hypertension BP is uncontrolled at 176/82.  Home medications include carvedilol 25 mg two times daily. Hydralazine 100 mg 2 times daily. Patient ran out of his blood pressure medications 3 days prior to admission.   -Restart Carvedilol 25mg  BID - Continue hydralazine 100 mg BID - Refill both blood pressure medications prior to discharge  dispo: Anticipated discharge today.  Valinda Party, DO 03/24/2016, 11:22 AM Pager: [phone]

## 2016-03-24 NOTE — Progress Notes (Signed)
Nephrology Progress Note   S:No acute events overnight. Pt feeling well  O:BP (!) 176/82 (BP Location: Left Arm)   Pulse 65   Temp 98.2 F (36.8 C) (Oral)   Resp 18   Ht 5\' 4"  (1.626 m)   Wt 194 lb 6.4 oz (88.2 kg)   SpO2 98%   BMI 33.37 kg/m   Intake/Output Summary (Last 24 hours) at 03/24/16 1219 Last data filed at 03/23/16 1800  Gross per 24 hour  Intake              360 ml  Output                0 ml  Net              360 ml   Intake/Output: I/O last 3 completed shifts: In: 600 [P.O.:600] Out: -   Intake/Output this shift:  No intake/output data recorded. Weight change: -3 lb 9.6 oz (-1.633 kg)  General: Vital signs reviewed. Patient in no acute distress Cardiovascular: regular rate, rhythm, no murmur appreciated  Pulmonary/Chest: Clear to auscultation bilaterally, no wheezes, rales, or rhonchi. Abdominal: Soft, non-tender, non-distended, BS + Extremities: trace pitting edema b/l      Recent Labs Lab 03/22/16 1107 03/23/16 0407 03/24/16 0354  NA 139 140 139  K 4.7 4.9 4.7  CL 110 110 107  CO2 23 25 24   GLUCOSE 156* 89 110*  BUN 48* 49* 52*  CREATININE 4.37* 4.63* 4.83*  ALBUMIN  --   --  3.4*  CALCIUM 8.7* 8.7* 8.8*  PHOS  --   --  5.2*   Liver Function Tests:  Recent Labs Lab 03/24/16 0354  ALBUMIN 3.4*   No results for input(s): LIPASE, AMYLASE in the last 168 hours. No results for input(s): AMMONIA in the last 168 hours. CBC:  Recent Labs Lab 03/22/16 1107 03/23/16 0407 03/24/16 0354  WBC 6.8 7.2 5.9  HGB 8.3* 7.8* 8.1*  HCT 24.7* 23.8* 25.1*  MCV 86.1 86.9 87.8  PLT 136* 143* 140*   Cardiac Enzymes:  Recent Labs Lab 03/22/16 1815 03/22/16 2333  TROPONINI <0.03 <0.03   CBG: No results for input(s): GLUCAP in the last 168 hours.  Iron Studies:  Recent Labs  03/23/16 1556  IRON 37*  TIBC 238*  FERRITIN 131   Studies/Results: Dg Chest 2 View  Result Date: 03/22/2016 CLINICAL DATA:  Chest pain and shortness of  breath. EXAM: CHEST  2 VIEW COMPARISON:  02/26/2016 FINDINGS: The heart is upper limits of normal in size. Mild stable tortuosity of the thoracic aorta. Mild peribronchial thickening and increased interstitial markings suspicious for interstitial edema or interstitial pneumonitis. No definite pleural effusions or focal infiltrates. The bony thorax is intact. IMPRESSION: Borderline cardiac size and interstitial pulmonary edema versus interstitial pneumonitis. Electronically Signed   By: Marijo Sanes M.D.   On: 03/22/2016 13:39   Nm Pulmonary Perf And Vent  Result Date: 03/22/2016 CLINICAL DATA:  Chronic shortness of breath. Chest pain. Dialysis patient. EXAM: NUCLEAR MEDICINE VENTILATION - PERFUSION LUNG SCAN TECHNIQUE: Ventilation images were obtained in multiple projections using inhaled aerosol Tc-69m DTPA. Perfusion images were obtained in multiple projections after intravenous injection of Tc-68m MAA. RADIOPHARMACEUTICALS:  31.2 mCi Technetium-67m DTPA aerosol inhalation and 3.1 mCi Technetium-9m MAA IV COMPARISON:  CT chest 08/29/2015.  PA and lateral chest today. FINDINGS: Ventilation: No focal ventilation defect. The patient had difficulty cooperating for the examination resulting in a somewhat limited study. No lateral images are provided.  Perfusion: No wedge shaped peripheral perfusion defects to suggest acute pulmonary embolism. IMPRESSION: Somewhat limited study without evidence of pulmonary embolus. No acute finding. Electronically Signed   By: Inge Rise M.D.   On: 03/22/2016 15:59   . darbepoetin (ARANESP) injection - NON-DIALYSIS  40 mcg Subcutaneous Once  . [START ON 03/31/2016] epoetin (EPOGEN/PROCRIT) injection  5,000 Units Subcutaneous Q Wed  . ferumoxytol  510 mg Intravenous Once  . furosemide  80 mg Oral Daily  . heparin  5,000 Units Subcutaneous Q8H  . hydrALAZINE  100 mg Oral BID  . omega-3 acid ethyl esters  1 g Oral Daily  . polyethylene glycol  17 g Oral Daily  .  pravastatin  40 mg Oral QPM  . sodium bicarbonate  650 mg Oral TID WC  . sodium chloride flush  3 mL Intravenous Q12H    BMET    Component Value Date/Time   NA 139 03/24/2016 0354   K 4.7 03/24/2016 0354   CL 107 03/24/2016 0354   CO2 24 03/24/2016 0354   GLUCOSE 110 (H) 03/24/2016 0354   BUN 52 (H) 03/24/2016 0354   CREATININE 4.83 (H) 03/24/2016 0354   CREATININE 3.41 (H) 10/01/2015 1108   CALCIUM 8.8 (L) 03/24/2016 0354   GFRNONAA 13 (L) 03/24/2016 0354   GFRNONAA 20 (L) 10/01/2015 1108   GFRAA 15 (L) 03/24/2016 0354   GFRAA 23 (L) 10/01/2015 1108   CBC    Component Value Date/Time   WBC 5.9 03/24/2016 0354   RBC 2.86 (L) 03/24/2016 0354   HGB 8.1 (L) 03/24/2016 0354   HCT 25.1 (L) 03/24/2016 0354   PLT 140 (L) 03/24/2016 0354   MCV 87.8 03/24/2016 0354   MCH 28.3 03/24/2016 0354   MCHC 32.3 03/24/2016 0354   RDW 12.2 03/24/2016 0354   LYMPHSABS 0.6 (L) 02/26/2016 1345   MONOABS 0.6 02/26/2016 1345   EOSABS 0.0 02/26/2016 1345   BASOSABS 0.0 02/26/2016 1345     Assessment/Plan:  Assessment/Plan: 1.  CKD Stage 5, not on dialysis yet : Patient's creatinine was around 3.5 in March 2017, and it peaked at around 9.5 on August 10 before trending down. His creatinine is slowly trending down and is currently 4.83 currently with GFR of 13. Patient's electrolytes are stable,His AV fistula also has not matured yet. Patient has only early morning nausea and is without any other signs of uremia such as anorexia, vomiting, pruritis, asterixis, hyperkalemia, or diuretic resistant volume overload.  His Scr has actually improved over the last month.  He is scheduled to follow up with Dr. Justin Glenn on 04/13/16.  No indication for dialysis at this time. 2. HTN with volume overload: Presented with blood pressure of 182/86 as Patient ran out of one of his antiHTn for 3 days, which may have exacerbated his possible underlying heart failure. He is on coreg 25 mg BID and hydralazine 100 mg BID. His  lasix was stopped at his prior hospitalization. Given his GFR of less than 30, pt needs to be on daily lasix, for HTN and volume overload.  Echo showing normal EF and G2DD. Follow up with Dr Eric Glenn 3. DM: glipizide stopped during last hospitalization due to hypoglycemia- Last A1c of 6- but it is not reliable in a CKD5 patient. Recommend adjusting therapy based on actual CBG numbers 4. Anemia of chronic disease: Iron last checked in February and was low. Ferritin of 131. B12 of 523. Pt is getting weekly Procrit injections (5000 units sq qweek to start  03/24/16). Recommend a dose of IV Iron 5. Secondary hyperparathyroidism: PTH of 149 6. Vascular access: 1st fistula in February 2017, but had to be converted to right brachiocephalic in July 0000000. The fistula has good thrill, but need to wait at atleast another 4 weeks before the fistula can be used for dialysis  Thank you for the consult, and we will sign off.  Burgess Estelle I have seen and examined this patient and agree with plan and assessment in the above note with renal recommendations/intervention highlighted. Eric Glenn has an appointment with Dr. Justin Glenn next week and will continue with outpatient EPO and IV Iron.  Will sign off.  Please call with questions or concerns.  [place] Hochman Fairley Copher,MD 03/24/2016 2:49 PM

## 2016-03-25 LAB — VITAMIN D 25 HYDROXY (VIT D DEFICIENCY, FRACTURES): Vit D, 25-Hydroxy: 18.2 ng/mL — ABNORMAL LOW (ref 30.0–100.0)

## 2016-03-29 ENCOUNTER — Ambulatory Visit: Payer: Self-pay

## 2016-03-29 ENCOUNTER — Ambulatory Visit (INDEPENDENT_AMBULATORY_CARE_PROVIDER_SITE_OTHER): Payer: Self-pay | Admitting: Internal Medicine

## 2016-03-29 VITALS — BP 182/92 | HR 75 | Temp 97.6°F | Ht 65.0 in | Wt 202.6 lb

## 2016-03-29 DIAGNOSIS — I120 Hypertensive chronic kidney disease with stage 5 chronic kidney disease or end stage renal disease: Secondary | ICD-10-CM

## 2016-03-29 DIAGNOSIS — Z79899 Other long term (current) drug therapy: Secondary | ICD-10-CM

## 2016-03-29 DIAGNOSIS — J329 Chronic sinusitis, unspecified: Secondary | ICD-10-CM

## 2016-03-29 DIAGNOSIS — N186 End stage renal disease: Secondary | ICD-10-CM

## 2016-03-29 DIAGNOSIS — J322 Chronic ethmoidal sinusitis: Secondary | ICD-10-CM

## 2016-03-29 DIAGNOSIS — I10 Essential (primary) hypertension: Secondary | ICD-10-CM

## 2016-03-29 MED ORDER — AMLODIPINE BESYLATE 5 MG PO TABS: 5.0000 mg | ORAL_TABLET | Freq: Every day | ORAL | 1 refills | Status: DC

## 2016-03-29 MED ORDER — FUROSEMIDE 80 MG PO TABS: 80.0000 mg | ORAL_TABLET | Freq: Two times a day (BID) | ORAL | 1 refills | Status: DC

## 2016-03-29 NOTE — Discharge Summary (Signed)
Name: Eric Glenn MRN: [id number] DOB: [birth year] 50 y.o. PCP: Ledell Noss, MD  Date of Admission: 03/22/2016 11:03 AM Date of Discharge: 03/24/2016 Attending Physician: Lalla Brothers MD  Discharge Diagnosis: 1. Shortness of breath due to volume overload due to ESRD Principal Problem:   Pain in the chest Active Problems:   Essential hypertension   Diabetes mellitus type 2, controlled ([place])   Diabetic retinopathy associated with type 2 diabetes mellitus (HCC)   Hyperlipidemia   Chronic kidney disease, stage IV (severe) (HCC)   Asthma   Chest pain   Discharge Medications:   Medication List    TAKE these medications   albuterol 108 (90 Base) MCG/ACT inhaler Commonly known as:  PROVENTIL HFA;VENTOLIN HFA Inhale 2 puffs into the lungs every 6 (six) hours as needed for wheezing or shortness of breath.   carvedilol 25 MG tablet Commonly known as:  COREG TAKE 1 TABLET BY MOUTH TWICE DAILY WITH A MEAL What changed:  how much to take  how to take this  when to take this  additional instructions   epoetin alfa 10000 UNIT/ML injection Commonly known as:  EPOGEN,PROCRIT Inject 0.5 mLs (5,000 Units total) into the skin every Wednesday. Start taking on:  03/31/2016   Fish Oil 1200 MG Caps Take 1,200 mg by mouth 2 (two) times daily.   furosemide 40 MG tablet Commonly known as:  LASIX Take 1 tablet (40 mg total) by mouth 2 (two) times daily.   glucose blood test strip Commonly known as:  TRUE METRIX BLOOD GLUCOSE TEST Use to check blood sugar daily. Code E 11.9.   hydrALAZINE 100 MG tablet Commonly known as:  APRESOLINE Take 1 tablet (100 mg total) by mouth 2 (two) times daily.   pravastatin 40 MG tablet Commonly known as:  PRAVACHOL Take 1 tablet (40 mg total) by mouth every evening.   RELION CONFIRM GLUCOSE MONITOR w/Device Kit Please use as directed.   sodium bicarbonate 650 MG tablet Take 1 tablet (650 mg total) by mouth 3 (three) times daily with  meals.       Disposition and follow-up:   [name] was discharged from [facility] in stable condition.  At the hospital follow up visit please address:  1.  Medication compliance, started on new dose of lasix  2.  Labs / imaging needed at time of follow-up: BMP, new dose of lasix  3.  Pending labs/ test needing follow-up: none  Follow-up Appointments: Follow-up Information    [place]. Go on 03/29/2016.   Why:  [place] 10:45 en [place] Contact information: [address] [place] [phone]       Dr. Justin Mend .   Why:  Nephrology on 9/26          Hospital Course by problem list: Principal Problem:   Pain in the chest Active Problems:   Essential hypertension   Diabetes mellitus type 2, controlled ([place])   Diabetic retinopathy associated with type 2 diabetes mellitus (HCC)   Hyperlipidemia   Chronic kidney disease, stage IV (severe) (HCC)   Asthma   Chest pain   1. Chest pain - patient came in with substernal chest pain worse with inspiration.  Troponins were negative. D-dimer was elevated and a nuclear medicine ventilation perfusion lung scan noted no focal ventilation defect or perfusion defect.  The chest pain was thought to be due to pleuritic pain from being fluid overloaded.  Echo  showed LV EF: 55-60% with grade 2 diastolic dysfunction.  Chest pain resolved prior to discharge after diuresis.  2. Volume overload -   Patient has chronic shortness of breath but became more pronounced in the last 2 days.  He is unable to lay flat and had been producing frothy white sputum upon being flat.  Bibasilar fine crackles were noted bilaterally on exam with 1+ pitting edema.  Patient was diuresed with lasix. He is supposed to start hemodialysis in one month and nephrology was consulted to see if hemodialysis could be started sooner.  Patient's fistula needs 4 more weeks for maturation  and dialysis could not be started inpatient.  He is following with nephrology.  On discharge fine crackles were no longer present, he was down 4lb and he was discharged with lasix 40 bid.  3.  Hypertension - There was concern for heart failure and Carvedilol was held because patient had run out of medication 3 days prior to admission.  It was restarted after echo results.  Hydralazine 125m BID was started and continued throughout admission.  Blood pressures were uncontrolled throughout stay.  This may improve with the start of dialysis.    4. Constipation - patient received two 17g packets of miralax  Discharge Vitals:   BP (!) 176/82 (BP Location: Left Arm)   Pulse 65   Temp 98.2 F (36.8 C) (Oral)   Resp 18   Ht _0  (1.626 m)   Wt 194 lb 6.4 oz (88.2 kg)   SpO2 98%   BMI 33.37 kg/m   Pertinent Labs, Studies, and Procedures:  Echo Study Conclusions  - Left ventricle: The cavity size was normal. Wall thickness was   increased in a pattern of mild LVH. Systolic function was normal.   The estimated ejection fraction was in the range of 55% to 60%.   Wall motion was normal; there were no regional wall motion   abnormalities. Features are consistent with a pseudonormal left   ventricular filling pattern, with concomitant abnormal relaxation   and increased filling pressure (grade 2 diastolic dysfunction). - Aortic valve: There was trivial regurgitation. - Ascending aorta: The ascending aorta was mildly dilated. - Mitral valve: There was mild regurgitation. - Left atrium: The atrium was mildly dilated.  Impressions:  - Normal LV systolic function; grade 2 diastolic dysfunction; no   pericardial effusion.  Chest X ray FINDINGS: The heart is upper limits of normal in size. Mild stable tortuosity of the thoracic aorta. Mild peribronchial thickening and increased interstitial markings suspicious for interstitial edema or interstitial pneumonitis. No definite pleural effusions  or focal infiltrates. The bony thorax is intact.  IMPRESSION: Borderline cardiac size and interstitial pulmonary edema versus interstitial pneumonitis.   Discharge Instructions: Discharge Instructions    Diet - low sodium heart healthy    Complete by:  As directed   Discharge instructions    Complete by:  As directed   Mr. HUmlandPlease start taking Lasix 439mtwo times a day.  Once at 8 am and second time at 2pm. You have been given refills on your blood pressure medications Please follow up with our clinic on 9/11 at 10:45 and nephrology on 9/11   Increase activity slowly    Complete by:  As directed      Signed: JeValinda PartyDO 03/29/2016, 8:55 AM   Pager: [phone]

## 2016-03-29 NOTE — Patient Instructions (Signed)
I have increased your Lasix dose to 80mg  twice per day.  I have added amlodipine 5mg  daily to your medications.  Take this paper prescription and coupon to [facility] to get for $4.  Please follow up with [name] in 1 week.  Please go to the lab when you leave.

## 2016-03-29 NOTE — Assessment & Plan Note (Signed)
A: Recently hospitalized for volume overload consistent with his ESRD.  Nephrology consulted during hospital stay but patient needs his fistula to mature for a few more weeks.  He has a follow up appt with Dr. Justin Mend later this month.  I think starting on HD will help with his uncontrolled BP.  P: - will attempt further diuresis and volume removal by increasing his diuretics in effort to also control his BP better. - BMET today - RTC 1 week - follow up with Dr. Justin Mend this month.

## 2016-03-29 NOTE — Progress Notes (Signed)
CC: here for hospital follow up for HTN and volume overload  HPI:  [name] is a 50 y.o. man with a past medical history listed below here today for follow up of his recent hospitalization for volume overload and HTN.   For details of today's visit and the status of his chronic medical issues please refer to the assessment and plan.   Past Medical History:  Diagnosis Date  . Anemia   . Chronic kidney disease (CKD), stage IV (severe) ([place])   . Diabetes mellitus without complication ([place])   . Diabetic retinopathy ([place])   . High cholesterol   . Hypertension     Review of Systems:   Review of Systems  Constitutional: Negative for chills and fever.  HENT: Positive for congestion.   Eyes: Negative for blurred vision.  Respiratory: Positive for shortness of breath. Negative for cough and sputum production.   Cardiovascular: Positive for leg swelling. Negative for chest pain.  Neurological: Negative for headaches.     Physical Exam:  Vitals:   03/29/16 1502  BP: (!) 182/92  Pulse: 75  Temp: 97.6 F (36.4 C)  TempSrc: Oral  SpO2: 100%  Weight: 202 lb 9.6 oz (91.9 kg)  Height: 5\' 5"  (1.651 m)   Physical Exam  Constitutional: He is oriented to person, place, and time and well-developed, well-nourished, and in no distress.  Accompanied by niece who helped with translation.  HENT:  Head: Normocephalic and atraumatic.  Cardiovascular: Normal rate and regular rhythm.   Pulmonary/Chest: Effort normal. No respiratory distress. He has no wheezes.  Normal respiratory effort with mild bibasilar crackles.  Musculoskeletal: He exhibits edema (1+ bilateral lower extremity edema).  Neurological: He is alert and oriented to person, place, and time.  Skin: Skin is warm and dry.  Psychiatric: Mood and affect normal.    Assessment & Plan:   See Encounters Tab for problem based charting.  Patient discussed with Dr. Daryll Glenn.  Sinusitis, chronic A: Patient  complaining of ongoing nasal congestion.  He has been evaluated by ENT who recommends septoplasty and turbinate reduction since he has failed medical therapy.  However, they want to wait until he starts HD before doing so.  P: - follow up with ENT after starting HD  Essential hypertension BP Readings from Last 3 Encounters:  03/29/16 (!) 182/92  03/24/16 (!) 176/82  03/10/16 (!) 164/77   A: BP not controlled today as well as during recent hospitalization.  He is currently on hydralazine 100mg  BID, Coreg 25mg  BID, and Lasix 40mg  BID.  He does not appear to be significantly volume overloaded on exam today with mild 1+ lower extremity edema, no SOB when ambulating to the exam room from waiting room and mild bibasilar crackles.  However, he does report orthopnea, DOE with climbing stairs, and his weight is up 8 pounds since discharge.  P: - continue hydralazine 100mg  BID - continue Coreg 25 mg BID - increase Lasix to 80mg  BID - start amlodipine 5mg  daily.  Coupon and paper script given to take to Eric Glenn for $4. - I think once he starts HD to control his volume and HTN some of these medications can be tapered/discontinued - BMET today - RTC 1 week   ESRD (end stage renal disease) ([place]) A: Recently hospitalized for volume overload consistent with his ESRD.  Nephrology consulted during hospital stay but patient needs his fistula to mature for a few more weeks.  He has a follow up appt with Dr. Justin Glenn later  this month.  I think starting on HD will help with his uncontrolled BP.  P: - will attempt further diuresis and volume removal by increasing his diuretics in effort to also control his BP better. - BMET today - RTC 1 week - follow up with Dr. Justin Glenn this month.

## 2016-03-29 NOTE — Assessment & Plan Note (Signed)
A: Patient complaining of ongoing nasal congestion.  He has been evaluated by ENT who recommends septoplasty and turbinate reduction since he has failed medical therapy.  However, they want to wait until he starts HD before doing so.  P: - follow up with ENT after starting HD

## 2016-03-29 NOTE — Assessment & Plan Note (Signed)
BP Readings from Last 3 Encounters:  03/29/16 (!) 182/92  03/24/16 (!) 176/82  03/10/16 (!) 164/77   A: BP not controlled today as well as during recent hospitalization.  He is currently on hydralazine 100mg  BID, Coreg 25mg  BID, and Lasix 40mg  BID.  He does not appear to be significantly volume overloaded on exam today with mild 1+ lower extremity edema, no SOB when ambulating to the exam room from waiting room and mild bibasilar crackles.  However, he does report orthopnea, DOE with climbing stairs, and his weight is up 8 pounds since discharge.  P: - continue hydralazine 100mg  BID - continue Coreg 25 mg BID - increase Lasix to 80mg  BID - start amlodipine 5mg  daily.  Coupon and paper script given to take to Kristopher Oppenheim for $4. - I think once he starts HD to control his volume and HTN some of these medications can be tapered/discontinued - BMET today - RTC 1 week

## 2016-03-30 LAB — BMP8+ANION GAP
ANION GAP: 15 mmol/L (ref 10.0–18.0)
BUN/Creatinine Ratio: 9 (ref 9–20)
BUN: 45 mg/dL — AB (ref 6–24)
CALCIUM: 8.3 mg/dL — AB (ref 8.7–10.2)
CHLORIDE: 103 mmol/L (ref 96–106)
CO2: 21 mmol/L (ref 18–29)
Creatinine, Ser: 5.13 mg/dL — ABNORMAL HIGH (ref 0.76–1.27)
GFR calc Af Amer: 14 mL/min/{1.73_m2} — ABNORMAL LOW (ref >59)
GFR calc non Af Amer: 12 mL/min/{1.73_m2} — ABNORMAL LOW (ref >59)
GLUCOSE: 210 mg/dL — AB (ref 65–99)
Potassium: 4.9 mmol/L (ref 3.5–5.2)
Sodium: 139 mmol/L (ref 134–144)

## 2016-04-05 ENCOUNTER — Ambulatory Visit (INDEPENDENT_AMBULATORY_CARE_PROVIDER_SITE_OTHER): Payer: Self-pay | Admitting: Internal Medicine

## 2016-04-05 DIAGNOSIS — I10 Essential (primary) hypertension: Secondary | ICD-10-CM

## 2016-04-05 DIAGNOSIS — K5900 Constipation, unspecified: Secondary | ICD-10-CM

## 2016-04-05 DIAGNOSIS — N186 End stage renal disease: Secondary | ICD-10-CM

## 2016-04-05 DIAGNOSIS — R21 Rash and other nonspecific skin eruption: Secondary | ICD-10-CM

## 2016-04-05 DIAGNOSIS — Z79899 Other long term (current) drug therapy: Secondary | ICD-10-CM

## 2016-04-05 DIAGNOSIS — Z87891 Personal history of nicotine dependence: Secondary | ICD-10-CM

## 2016-04-05 DIAGNOSIS — I120 Hypertensive chronic kidney disease with stage 5 chronic kidney disease or end stage renal disease: Secondary | ICD-10-CM

## 2016-04-05 LAB — GLUCOSE, CAPILLARY: GLUCOSE-CAPILLARY: 161 mg/dL — AB (ref 65–99)

## 2016-04-05 MED ORDER — CLOTRIMAZOLE 1 % EX CREA
1.0000 | TOPICAL_CREAM | Freq: Two times a day (BID) | CUTANEOUS | 0 refills | Status: DC
Start: 2016-04-05 — End: 2016-07-20

## 2016-04-05 MED ORDER — AMLODIPINE BESYLATE 10 MG PO TABS: 10.0000 mg | ORAL_TABLET | Freq: Every day | ORAL | 1 refills | Status: DC

## 2016-04-05 MED ORDER — SODIUM BICARBONATE 650 MG PO TABS: 650.0000 mg | ORAL_TABLET | Freq: Three times a day (TID) | ORAL | 0 refills | Status: DC

## 2016-04-05 NOTE — Assessment & Plan Note (Signed)
A: Patient reports constipation with no bowel movement for 3 days.  When hospitalized he was given Miralax for relieve.  No abdominal pain otherwise.  Has been drinking more milk to attempt for more frequent bowel movements.  P: - recommended adequate fiber diet. - recommended Miralax to purchase OTC.

## 2016-04-05 NOTE — Assessment & Plan Note (Signed)
BP Readings from Last 3 Encounters:  04/05/16 (!) 166/84  03/29/16 (!) 182/92  03/24/16 (!) 176/82   A:  BP better today but better than it was 1 week ago with the addition of low-dose amlodipine and increasing his Lasix.  He reports no issue with access to the amlodipine and no worsening of his edema.  P: - increase amlodipine to 10mg  daily. - continue hydralazine 100mg  BID - continue Coreg 25 mg BID - continue Lasix to 80mg  BID - RTC 2 weeks

## 2016-04-05 NOTE — Patient Instructions (Addendum)
Thank you for coming to see me today. It was a pleasure. Today we talked about:   Rash: I have sent a new medication to use for 4 weeks to treat what I think is a fungal infection.  We have increased your amlodipine to 10mg  daily.  Please try Miralax for constipation and eat enough fiber in your diet.  Please follow-up with [name] in 2 weeks.  If you have any questions or concerns, please do not hesitate to call the office at [phone].  Take Care,   Jule Ser, DO Tia corporal  (Body Ringworm) La tia corporal (tinea corporis) es una infeccin por hongos en la piel del cuerpo. La causa de esta infeccin no son gusanos, sino un hongo. Los hongos normalmente viven en la superficie de la piel y pueden ser tiles. Sin embargo, en el caso de la [place], los hongos crecen de [place] descontrolada y causan una infeccin en la piel. Puede afectar a cualquier zona de la piel del cuerpo y puede propagarse fcilmente de Ardelia Mems persona a otra (es contagiosa). La tia es un problema frecuente en los nios, pero tambin puede afectar a los adultos. Tambin generalmente la sufren los atletas, en especial en los luchadores que comparten equipos y colchonetas.  CAUSAS  La causa de la tia corporal es un hongo llamado dermatofito. Se puede propagar a travs de:   Contacto con [employer] infectadas.  Contacto con mascotas infectadas.  Tocar o compartir objetos que [employer] en contacto con una persona o con una mascota infectada (sombreros, peines, toallas, ropa, artculos deportivos). SNTOMAS   Picazn, manchas rojas elevadas o bultos en la piel.  Erupcin en forma de anillos.  Enrojecimiento cerca del borde de la erupcin con un centro claro.  Piel seca y escamosa dentro o alrededor de la erupcin. No todas las personas tienen una erupcin en forma de [place]. Algunos desarrollan slo manchas rojas y escamosas.  DIAGNSTICO  Generalmente, la tia puede diagnosticarse mediante la  realizacin de un examen de la piel. El mdico puede optar por realizar un raspado de la piel de la zona afectada. La muestra se examinar con un microscopio para determinar si hay hongos. TRATAMIENTO  La tia corporal puede tratarse con una crema o ungento antifngico tpico. En algunos casos, se indica un champ antihongos para el cuerpo. Podrn recetarle medicamentos antimicticos para tomar por boca si la tia es grave, si reaparece o si se prolonga por mucho tiempo.  [place] slo medicamentos de venta libre o recetados, segn las indicaciones del mdico.  Stacy Gardner el rea afectada y seque bien antes de aplicar la crema o la pomada.  Cuando use el champ antimictico para tratar la tia, deje el [employer] cuerpo durante 3 a 5 minutos antes de enjuagar.   Use ropa suelta para evitar roces e irritacin en la erupcin.  Lave o cambie sus sbanas cada noche mientras tiene la erupcin.  Si su mascota tiene la misma infeccin, hgalo tratar por un veterinario. Para prevenir la tia corporal:   Mantenga una buena higiene.  Use sandalias o zapatos en lugares pblicos y duchas.  No comparta artculos personales con [employer].  Evite tocar las manchas rojas de piel de Producer, television/film/video.  Evite tocar las [employer] tienen zonas sin pelos o lvese las manos despus de tocarlo. SOLICITE ATENCIN MDICA SI:   La erupcin contina diseminndose despus de 7 das de [place].  La erupcin no  se cura en el trmino de 4 semanas.  El rea alrededor de la erupcin se vuelve roja, se hincha o duele.   Esta informacin no tiene Marine scientist el consejo del mdico. Asegrese de hacerle al mdico cualquier pregunta que tenga.   Document Released: 04/14/2005 Document Revised: 03/29/2012 Elsevier Interactive Patient Education [employer].

## 2016-04-05 NOTE — Progress Notes (Addendum)
CC: here for BP follow up and new rash on left leg.  HPI:  [name] is a 50 y.o. man with a past medical history listed below here today for follow up of his BP and with acute complaint of rash on his leg.   For details of today's visit and the status of his chronic medical issues please refer to the assessment and plan.   Past Medical History:  Diagnosis Date  . Anemia   . Chronic kidney disease (CKD), stage IV (severe) ([place])   . Diabetes mellitus without complication ([place])   . Diabetic retinopathy ([place])   . High cholesterol   . Hypertension     Review of Systems:   Please see pertinent ROS reviewed in HPI and problem based charting.   Physical Exam:  Vitals:   04/05/16 1119  BP: (!) 166/84  Pulse: 86  Temp: 97.8 F (36.6 C)  TempSrc: Oral  SpO2: 99%  Weight: 201 lb 4.8 oz (91.3 kg)  Height: 5\' 5"  (1.651 m)   Physical Exam  Constitutional: He is oriented to person, place, and time and well-developed, well-nourished, and in no distress.  Accompanied by niece who assisted with interpretation.  HENT:  Head: Normocephalic and atraumatic.  Cardiovascular: Normal rate and regular rhythm.   Pulmonary/Chest: Effort normal and breath sounds normal. He has no wheezes. He has no rales.  Musculoskeletal: He exhibits no edema.  Neurological: He is alert and oriented to person, place, and time.  Skin: Skin is warm and dry. Rash noted.  2 separate 1.5cm x 1.5cm well-demarcated lesions on left lower leg with heaped edges.  No ulceration or drainage.  No sloughing of skin.  Non-tender.  Psychiatric: Mood and affect normal.     Assessment & Plan:   See Encounters Tab for problem based charting.  Patient seen with Dr. Angelia Mould.  Constipation A: Patient reports constipation with no bowel movement for 3 days.  When hospitalized he was given Miralax for relieve.  No abdominal pain otherwise.  Has been drinking more milk to attempt for more frequent bowel  movements.  P: - recommended adequate fiber diet. - recommended Miralax to purchase OTC.  ESRD (end stage renal disease) ([place]) A: Follow up today for BP and volume status.  Last week we increased his Lasix to 80mg  BID.  He has been tolerating this well.  His lower extremity edema is better.  He has orthopnea that is unchanged.  He has an appointment next week with Dr. Justin Mend.  P: - continue current dose of lasix 80mg  BID - follow up with Dr. Justin Mend - RTC 2 weeks for volume status and BP check.  Essential hypertension BP Readings from Last 3 Encounters:  04/05/16 (!) 166/84  03/29/16 (!) 182/92  03/24/16 (!) 176/82   A:  BP better today but better than it was 1 week ago with the addition of low-dose amlodipine and increasing his Lasix.  He reports no issue with access to the amlodipine and no worsening of his edema.  P: - increase amlodipine to 10mg  daily. - continue hydralazine 100mg  BID - continue Coreg 25 mg BID - continue Lasix to 80mg  BID - RTC 2 weeks  Rash and nonspecific skin eruption A: Patient reports 3 weeks of a skin rash on his left lower leg that developed with no clear precipitant.  He denies pain, tenderness, injury or trauma, pruritis, drainage, fever, or chills.  He has not used any new detergents, clothing, foods, etc.  He  has no pets that lick this area.  On exam, he has 2 clearly defined, well-demarcated plaque-like lesions that are about 1.5cm in size.  Appears consistent with tinea corporis with some inflammatory component.  Has been trying moisturizer at home for relief.  He says this does not really help but makes him feel refreshed.  P: - will try treatment with clotrimazole 1% cream BID for 4 weeks. - RTC 2 weeks to reassess. - if no improvement, could consider topical steroid or skin biopsy.

## 2016-04-05 NOTE — Assessment & Plan Note (Signed)
A: Patient reports 3 weeks of a skin rash on his left lower leg that developed with no clear precipitant.  He denies pain, tenderness, injury or trauma, pruritis, drainage, fever, or chills.  He has not used any new detergents, clothing, foods, etc.  He has no pets that lick this area.  On exam, he has 2 clearly defined, well-demarcated plaque-like lesions that are about 1.5cm in size.  Appears consistent with tinea corporis with some inflammatory component.  Has been trying moisturizer at home for relief.  He says this does not really help but makes him feel refreshed.  P: - will try treatment with clotrimazole 1% cream BID for 4 weeks. - RTC 2 weeks to reassess. - if no improvement, could consider topical steroid or skin biopsy.

## 2016-04-05 NOTE — Assessment & Plan Note (Signed)
A: Follow up today for BP and volume status.  Last week we increased his Lasix to 80mg  BID.  He has been tolerating this well.  His lower extremity edema is better.  He has orthopnea that is unchanged.  He has an appointment next week with Dr. Justin Mend.  P: - continue current dose of lasix 80mg  BID - follow up with Dr. Justin Mend - RTC 2 weeks for volume status and BP check.

## 2016-04-06 NOTE — Progress Notes (Signed)
Internal Medicine Clinic Attending  Case discussed with Dr. Wallace at the time of the visit.  We reviewed the resident's history and exam and pertinent patient test results.  I agree with the assessment, diagnosis, and plan of care documented in the resident's note.  

## 2016-04-07 NOTE — Progress Notes (Signed)
Internal Medicine Clinic Attending  I saw and evaluated the patient.  I personally confirmed the key portions of the history and exam documented by Dr. Wallace and I reviewed pertinent patient test results.  The assessment, diagnosis, and plan were formulated together and I agree with the documentation in the resident's note. 

## 2016-04-16 ENCOUNTER — Ambulatory Visit: Payer: Self-pay | Admitting: Pharmacist

## 2016-04-26 ENCOUNTER — Telehealth: Payer: Self-pay | Admitting: Internal Medicine

## 2016-04-26 NOTE — Telephone Encounter (Signed)
APT. REMINDER CALL, LMTCB °

## 2016-04-27 ENCOUNTER — Encounter: Payer: Self-pay | Admitting: Internal Medicine

## 2016-05-07 ENCOUNTER — Ambulatory Visit (INDEPENDENT_AMBULATORY_CARE_PROVIDER_SITE_OTHER): Payer: Self-pay | Admitting: Internal Medicine

## 2016-05-07 ENCOUNTER — Encounter: Payer: Self-pay | Admitting: Internal Medicine

## 2016-05-07 VITALS — BP 157/69 | HR 74 | Temp 98.2°F | Ht 65.5 in | Wt 199.1 lb

## 2016-05-07 DIAGNOSIS — L989 Disorder of the skin and subcutaneous tissue, unspecified: Secondary | ICD-10-CM

## 2016-05-07 DIAGNOSIS — Z992 Dependence on renal dialysis: Secondary | ICD-10-CM

## 2016-05-07 DIAGNOSIS — R21 Rash and other nonspecific skin eruption: Secondary | ICD-10-CM

## 2016-05-07 DIAGNOSIS — B354 Tinea corporis: Secondary | ICD-10-CM

## 2016-05-07 NOTE — Patient Instructions (Addendum)
You were seen today for the rash on your skin.  Since the rash has not gone away, we performed a skin biopsy today to better figure out what is causing it.    While we wait for the results, you can continue to use both lotions on the affected areas.  I will call you or send a letter with the results of the biopsy, and if we need to do anything different to treat it.

## 2016-05-07 NOTE — Assessment & Plan Note (Signed)
Few circular hyperpigmented plaques surrounded by sparse erythematous papules, seem largely stable for past month with treatment with topical antifungals and steroids.  Non-pruritic and non-bleeding.  No known exposures or contacts.  Morphology is consistent with tinea corporis, though questionable response to antifungals.  -shave biopsy -continue clotrimazole 1% BID and triamcinolone 0.1% daily -RTC PRN pending biopsy results

## 2016-05-07 NOTE — Progress Notes (Signed)
   CC: rash on left leg  HPI:  [name] is a 50 y.o. man with history of HTN and DM2 who presents with a rash on his left lower leg.  He was last seen in clinic on 9/18 with a rash on his left leg for 3 weeks.  He was treated for tinea corporis with 1% clotrimazole cream which he has been using twice daily, as well as 0.1% triamcinolone cream one per day which he was prescribed by his nephrologist.  He thinks the creams have been helping, but that new areas are coming up.  The rash is not pruritic or painful, does not bleed, and does not weep fluid.  Has red, itchy rash on his R arm around AV fistula which he says is from tape at dialysis.  Denies rash elsewhere on his body.  He started dialysis 2 weeks ago and has been tolerating well.  Past Medical History:  Diagnosis Date  . Anemia   . Chronic kidney disease (CKD), stage IV (severe) ([place])   . Diabetes mellitus without complication ([place])   . Diabetic retinopathy ([place])   . High cholesterol   . Hypertension     Review of Systems:   Review of Systems  Constitutional: Negative for chills and fever.  Cardiovascular: Negative for leg swelling.  Skin: Positive for rash. Negative for itching.  Endo/Heme/Allergies: Does not bruise/bleed easily.    Physical Exam:  Vitals:   05/07/16 1109  BP: (!) 157/69  Pulse: 74  Temp: 98.2 F (36.8 C)  TempSrc: Oral  Weight: 199 lb 1.6 oz (90.3 kg)   Physical Exam  Constitutional: He is oriented to person, place, and time. He appears well-developed and well-nourished. No distress.  Neurological: He is alert and oriented to person, place, and time.  Skin:  Two discrete circular hyperpigmented plaques, each 2-3 cm diameter, on left lower leg/ankle, one medial and one lateral.  Surrounded by erythematous papules.  R AC AV fistula surrounded by erythemoutous maculopapular rash  Psychiatric: He has a normal mood and affect. His behavior is normal.         Assessment &  Plan:   See Encounters Tab for problem based charting.  Patient seen with Dr. Angelia Mould   Shave Biopsy Procedure Note  Pre-operative Diagnosis: tinea corporis  Locations:left, medial lower leg  Anesthesia: Lidocaine 1% without epinephrine  Procedure Details  History of allergy to lidocaine: no Sensitivity to epinephrine: no  Patient informed of the risks (including bleeding and infection) and benefits of the  procedure and verbal and written informed consent obtained.  The lesion and surrounding area was prepped with an alcohol swab. A scalpel or Dermablade was used to shave an area of skin for the left medial lower leg.  Hemostasis achieved with manual pressure and pressure dressing. Vaseline and a sterile dressing applied.  The specimen was sent for pathologic examination. The patient tolerated the procedure well.  Condition: Stable  Complications: none.  Plan: 1. Instructed to keep the wound dry and covered for 24-48h and clean thereafter. 2. Patient instructed to apply Vaseline daily until healed. 3. Warning signs of infection were reviewed.

## 2016-05-10 NOTE — Progress Notes (Signed)
Internal Medicine Clinic Attending  I saw and evaluated the patient.  I personally confirmed the key portions of the history and exam documented by Dr. Inda Castle and I reviewed pertinent patient test results.  The assessment, diagnosis, and plan were formulated together and I agree with the documentation in the resident's note. I supervised the skin biopsy. We clinically suspect Tinea corporis, did improve with clotrimazole but has returned.  Patient may continue clotrimazole and triamcinolone creams.

## 2016-06-28 ENCOUNTER — Other Ambulatory Visit: Payer: Self-pay | Admitting: Internal Medicine

## 2016-06-28 DIAGNOSIS — I10 Essential (primary) hypertension: Secondary | ICD-10-CM

## 2016-06-30 ENCOUNTER — Other Ambulatory Visit: Payer: Self-pay | Admitting: Internal Medicine

## 2016-06-30 DIAGNOSIS — I10 Essential (primary) hypertension: Secondary | ICD-10-CM

## 2016-06-30 NOTE — Telephone Encounter (Signed)
[place] & informed pharmacist that amlodipine was filled 12/11 x 90 tabs per medication record & refill request is too soon. Pharmacist stated will look for the previous rx.

## 2016-07-20 ENCOUNTER — Ambulatory Visit (INDEPENDENT_AMBULATORY_CARE_PROVIDER_SITE_OTHER): Payer: Self-pay | Admitting: Internal Medicine

## 2016-07-20 ENCOUNTER — Ambulatory Visit: Payer: Self-pay

## 2016-07-20 VITALS — BP 198/89 | HR 79 | Temp 97.7°F | Ht 65.5 in | Wt 195.3 lb

## 2016-07-20 DIAGNOSIS — I10 Essential (primary) hypertension: Secondary | ICD-10-CM

## 2016-07-20 DIAGNOSIS — E1165 Type 2 diabetes mellitus with hyperglycemia: Secondary | ICD-10-CM

## 2016-07-20 DIAGNOSIS — R21 Rash and other nonspecific skin eruption: Secondary | ICD-10-CM

## 2016-07-20 DIAGNOSIS — Z992 Dependence on renal dialysis: Secondary | ICD-10-CM

## 2016-07-20 DIAGNOSIS — E1122 Type 2 diabetes mellitus with diabetic chronic kidney disease: Secondary | ICD-10-CM

## 2016-07-20 DIAGNOSIS — N186 End stage renal disease: Secondary | ICD-10-CM

## 2016-07-20 DIAGNOSIS — E11319 Type 2 diabetes mellitus with unspecified diabetic retinopathy without macular edema: Secondary | ICD-10-CM

## 2016-07-20 DIAGNOSIS — Z0000 Encounter for general adult medical examination without abnormal findings: Secondary | ICD-10-CM

## 2016-07-20 DIAGNOSIS — Z87891 Personal history of nicotine dependence: Secondary | ICD-10-CM

## 2016-07-20 DIAGNOSIS — I120 Hypertensive chronic kidney disease with stage 5 chronic kidney disease or end stage renal disease: Secondary | ICD-10-CM

## 2016-07-20 DIAGNOSIS — N184 Chronic kidney disease, stage 4 (severe): Principal | ICD-10-CM

## 2016-07-20 LAB — GLUCOSE, CAPILLARY: GLUCOSE-CAPILLARY: 329 mg/dL — AB (ref 65–99)

## 2016-07-20 MED ORDER — HYDRALAZINE HCL 100 MG PO TABS: 50.0000 mg | ORAL_TABLET | Freq: Three times a day (TID) | ORAL | 1 refills | Status: DC

## 2016-07-20 MED ORDER — GLIPIZIDE 5 MG PO TABS: 5.0000 mg | ORAL_TABLET | Freq: Every day | ORAL | 1 refills | Status: DC

## 2016-07-20 MED ORDER — TRIAMCINOLONE ACETONIDE 0.1 % EX CREA: 1.0000 "application " | TOPICAL_CREAM | Freq: Two times a day (BID) | CUTANEOUS | 1 refills | Status: DC

## 2016-07-20 MED ORDER — AMLODIPINE BESYLATE 10 MG PO TABS: 10.0000 mg | ORAL_TABLET | Freq: Every day | ORAL | 0 refills | Status: DC

## 2016-07-20 MED ORDER — CLOTRIMAZOLE 1 % EX CREA: 1.0000 "application " | TOPICAL_CREAM | Freq: Two times a day (BID) | CUTANEOUS | 0 refills | Status: DC

## 2016-07-20 NOTE — Progress Notes (Signed)
   CC: Medication management for hypertension and diabetes  HPI:  [name] is a 51 y.o. man with a history of hypertension and type 2 diabetes complicated by retinopathy and end-stage renal disease who is here today for medication management dealing restart of antihypertensives. He was recently started on hemodialysis late last year and although mostly normotensive during HD sessions was recommended to have his primary medicine office sort out changes to his blood pressure medicines. It looks like these were modified during a recent hospitalization and had been unclear since that time. He was also noted that his blood sugars have been running very high although he does not check these at home while he was staying with his mother. He denies having symptoms of bad headaches, thirst, or worsening of his chronic blurry vision. He is about to start saying with his daughter who states that she will be checking his blood sugars.  See problem based assessment and plan below for additional details.  Past Medical History:  Diagnosis Date  . Anemia   . Chronic kidney disease (CKD), stage IV (severe) ([place])   . Diabetes mellitus without complication ([place])   . Diabetic retinopathy ([place])   . High cholesterol   . Hypertension     Review of Systems:  Review of Systems  Constitutional: Negative for fever.  Eyes: Positive for blurred vision.  Respiratory: Negative for shortness of breath.   Cardiovascular: Negative for chest pain and leg swelling.  Gastrointestinal: Negative for abdominal pain.  Musculoskeletal: Positive for joint pain.  Skin: Positive for itching and rash.  Neurological: Negative for dizziness and focal weakness.  Psychiatric/Behavioral: The patient does not have insomnia.     Physical Exam:  Vitals:   07/20/16 0945  BP: (!) 198/89  Pulse: 79  Temp: 97.7 F (36.5 C)  TempSrc: Oral  SpO2: 100%  Weight: 195 lb 4.8 oz (88.6 kg)  Height: 5' 5.5" (1.664 m)    GENERAL- alert, co-operative, NAD HEENT- Bilateral pterygium on medial side of eyes CARDIAC- RRR, no murmurs, rubs or gallops RESP- CTAB, no wheezes or crackles ABDOMEN- Soft, nontender, no guarding or rebound EXTREMITIES- no pedal edema, RUE AVF +thrill SKIN- multiple 1-3cm diameter nontender areas of patchy cracked skin on RLE pretibial and LLE medial and lateral ankle PSYCH- Normal mood and affect, appropriate thought content and speech.    Assessment & Plan:   See Encounters Tab for problem based charting.  Patient discussed with Dr. Lynnae January

## 2016-07-20 NOTE — Patient Instructions (Addendum)
It was a pleasure to see you today Eric Glenn.  I recommend starting blood pressure medicines amlodipine 10mg  daily and hydralazine 50mg  TID.  For diabetes I recommend taking glipizide 5mg  daily in the morning before breakfast. I will also recommend checking home blood sugars until your next appointment so we have more information to decide on treatment.  For your legs I have prescribed a topical steroid to treat these. Apply this triamcinolone to the affected areas twice daily until they improve. A copy of the pathology report is included for reference by your hemodialysis unit as well.  We will need to see you again later this month since I expected additional adjustments are needed before you have good control of these chronic problems again.

## 2016-07-21 NOTE — Assessment & Plan Note (Addendum)
He continues to have some of these cracked and hyperpigmented plaques on his bilateral lower extremities. Based on shave biopsy data from last October there was no evidence of tinea corporis rather a nonspecific inflammatory eruption. He states that they did improve with treatment but then worsened again.   -Ordered 0.1% topical triamcinolone daily -Instructed him to keep his legs clean and dry as much of the time as possible

## 2016-07-21 NOTE — Assessment & Plan Note (Signed)
A: He currently has uncontrolled diabetes with a CBG of 329 today in clinic. His A1c was recently checked in December and was above goal at that time. He is off all treatment at this time. He previously was taking glipizide and it sounds like also insulin for a very brief time.  P: -Restart glipizide 5 mg daily today -He'll be staying with his daughter who states she can help with checking [place] blood sugars before his next visit -He probably needs additional titration up of treatment at his next follow-up

## 2016-07-21 NOTE — Assessment & Plan Note (Signed)
He already had a flu vaccine late last year at the hemodialysis center

## 2016-07-21 NOTE — Assessment & Plan Note (Signed)
BP Readings from Last 3 Encounters:  07/20/16 (!) 198/89  05/07/16 (!) 157/69  04/05/16 (!) 166/84   Assessment: Blood pressure control: Uncontrolled Progress toward BP goal:  Worse Comments: Mr. Weick is off all antihypertensive medications at this time. He was previously taking amlodipine, hydralazine, carvedilol, and Lasix. He no longer needs diuretics as he is end-stage renal disease dependent on hemodialysis with minimal residual function. He does not have a significant heart failure history so I do not know that carvedilol is the ideal medicine choice for him. He is not having any signs or symptoms of hypertensive urgency/emergency today.  Plan: Medications:  Restart amlodipine 10 mg daily   Restart hydralazine at 50 mg 3 times a day   Hold off on Coreg at this time Other plans: Recommend follow-up at clinic again before the end of the month for further medication titration

## 2016-07-23 NOTE — Progress Notes (Signed)
Internal Medicine Clinic Attending  Case discussed with Dr. Rice soon after the resident saw the patient.  We reviewed the resident's history and exam and pertinent patient test results.  I agree with the assessment, diagnosis, and plan of care documented in the resident's note. 

## 2016-08-17 ENCOUNTER — Encounter: Payer: Self-pay | Admitting: Internal Medicine

## 2016-08-19 ENCOUNTER — Encounter: Payer: Self-pay | Admitting: Internal Medicine

## 2016-09-04 ENCOUNTER — Other Ambulatory Visit: Payer: Self-pay | Admitting: Internal Medicine

## 2016-09-04 DIAGNOSIS — E1122 Type 2 diabetes mellitus with diabetic chronic kidney disease: Secondary | ICD-10-CM

## 2016-09-04 DIAGNOSIS — N184 Chronic kidney disease, stage 4 (severe): Principal | ICD-10-CM

## 2016-09-14 ENCOUNTER — Encounter: Payer: Self-pay | Admitting: Internal Medicine

## 2016-10-03 ENCOUNTER — Other Ambulatory Visit: Payer: Self-pay | Admitting: Internal Medicine

## 2016-10-03 DIAGNOSIS — I10 Essential (primary) hypertension: Secondary | ICD-10-CM

## 2016-10-28 ENCOUNTER — Encounter: Payer: Self-pay | Admitting: Internal Medicine

## 2016-10-28 ENCOUNTER — Ambulatory Visit (INDEPENDENT_AMBULATORY_CARE_PROVIDER_SITE_OTHER): Payer: Self-pay | Admitting: Internal Medicine

## 2016-10-28 ENCOUNTER — Encounter (INDEPENDENT_AMBULATORY_CARE_PROVIDER_SITE_OTHER): Payer: Self-pay

## 2016-10-28 DIAGNOSIS — N184 Chronic kidney disease, stage 4 (severe): Principal | ICD-10-CM

## 2016-10-28 DIAGNOSIS — E1165 Type 2 diabetes mellitus with hyperglycemia: Secondary | ICD-10-CM

## 2016-10-28 DIAGNOSIS — Z87891 Personal history of nicotine dependence: Secondary | ICD-10-CM

## 2016-10-28 DIAGNOSIS — Z7984 Long term (current) use of oral hypoglycemic drugs: Secondary | ICD-10-CM

## 2016-10-28 DIAGNOSIS — Z992 Dependence on renal dialysis: Secondary | ICD-10-CM

## 2016-10-28 DIAGNOSIS — I10 Essential (primary) hypertension: Secondary | ICD-10-CM

## 2016-10-28 DIAGNOSIS — E1122 Type 2 diabetes mellitus with diabetic chronic kidney disease: Secondary | ICD-10-CM

## 2016-10-28 LAB — GLUCOSE, CAPILLARY: GLUCOSE-CAPILLARY: 220 mg/dL — AB (ref 65–99)

## 2016-10-28 LAB — POCT GLYCOSYLATED HEMOGLOBIN (HGB A1C): Hemoglobin A1C: 7.3

## 2016-10-28 MED ORDER — TRUE METRIX AIR GLUCOSE METER W/DEVICE KIT: 1.0000 | PACK | Freq: Every day | 0 refills | Status: DC

## 2016-10-28 MED ORDER — GLIPIZIDE 5 MG PO TABS: 5.0000 mg | ORAL_TABLET | Freq: Every day | ORAL | 1 refills | Status: DC

## 2016-10-28 MED ORDER — GLUCOSE BLOOD VI STRP: ORAL_STRIP | 12 refills | Status: DC

## 2016-10-28 NOTE — Assessment & Plan Note (Signed)
A1c has increased from 6.0 to 7.3. He has not been taking his glipizide due to confusion about whether he should be taking this or not. He may be misunderstanding his nurses at dialysis given the language barrier. Advised patient to start glipizide 5 mg daily and check blood sugars 4 times daily before meals and at bedtime for the next 2 weeks. Sent a prescription for a new meter and test strips to his pharmacy. He will follow-up in 2 weeks for reassessment of his blood glucose control. He may need insulin therapy in addition to glipizide if blood sugars remain uncontrolled.

## 2016-10-28 NOTE — Patient Instructions (Signed)
General Instructions: - Restart Glipizide 5 mg ONCE DAILY - Check blood sugars 4 times daily (before meals and bedtime) every day for next 2 weeks - Follow up in 2 weeks to see how sugars are doing - Will discuss with Dr. Justin Mend if addition of a new blood pressure medication is ok  Thank you for bringing your medicines today. This helps [name] keep you safe from mistakes.   Progress Toward Treatment Goals:  No flowsheet data found.  Self Care Goals & Plans:  No flowsheet data found.  No flowsheet data found.   Care Management & Community Referrals:  No flowsheet data found.

## 2016-10-28 NOTE — Progress Notes (Signed)
   CC: Diabetes follow-up  HPI:  [name] is a 51 y.o. man with past medical history as noted below who presents today for follow-up of his diabetes. The patient's niece has accompanied him to the visit and helped with translation per the patient's preference.  Type 2 diabetes: Last A1c 6.0 in August 2017. His A1c today is 7.3. His niece reports being told by dialysis nurses that his blood sugars have been uncontrolled for the last several weeks, ranging in the 200s to 400s. Niece reports the patient was told to stop taking his glipizide by the dialysis nurses as this medication was causing his blood sugars to go high. Patient does admit he has a hard time communicating at dialysis because he does not speak English very well and does not understand a lot of what he is being told. He has not been taking his glipizide. He reports losing his glucometer and is unable to check his blood sugars at home.  HTN: BP is elevated today at 161/77. Patient reports his blood pressure is "always" high. He is taking amlodipine 10 mg daily and hydralazine 50 mg 3 times daily.  Past Medical History:  Diagnosis Date  . Anemia   . Chronic kidney disease (CKD), stage IV (severe) ([place])   . Diabetes mellitus without complication ([place])   . Diabetic retinopathy ([place])   . High cholesterol   . Hypertension     Review of Systems:   General: Denies fever, chills, night sweats, changes in weight, changes in appetite HEENT: Denies headaches, ear pain, changes in vision, rhinorrhea, sore throat CV: Denies CP, palpitations, SOB, orthopnea Pulm: Denies SOB, cough, wheezing GI: Denies abdominal pain, nausea, vomiting, diarrhea, constipation, melena, hematochezia GU: Denies dysuria, hematuria, frequency Msk: Denies muscle cramps, joint pains Neuro: Denies weakness, numbness, tingling Skin: Denies rashes, bruising Psych: Denies depression, anxiety, hallucinations  Physical Exam:  Vitals:   10/28/16  0905  BP: (!) 161/77  Pulse: 73  Temp: 97.5 F (36.4 C)  TempSrc: Oral  SpO2: 99%  Weight: 194 lb 12.8 oz (88.4 kg)   General: Middle aged man, NAD CV: RRR, no m/g/r Pulm: CTA bilaterally, breaths non-labored  Ext: 1+ peripheral edema   Assessment & Plan:   See Encounters Tab for problem based charting.  Patient discussed with Dr. Dareen Piano

## 2016-10-28 NOTE — Assessment & Plan Note (Signed)
BP is elevated and patient reports a history of elevated pressures. Will have him continue amlodipine 10 mg daily and hydralazine 50 mg 3 times daily. Will contact his nephrologist, Dr. Justin Mend, and see if the addition of a clonidine patch would be okay with him to better control the patient's blood pressure. Advised the patient to check his blood pressure daily and record this on a log. His niece will help him with this. Will have him follow up in 2 weeks for BP recheck.

## 2016-10-29 ENCOUNTER — Telehealth: Payer: Self-pay

## 2016-10-29 NOTE — Telephone Encounter (Signed)
Pharmacy note to presciber meter states test qid, but strips says 1 QD please clarify

## 2016-10-31 ENCOUNTER — Other Ambulatory Visit: Payer: Self-pay | Admitting: Internal Medicine

## 2016-10-31 DIAGNOSIS — N184 Chronic kidney disease, stage 4 (severe): Principal | ICD-10-CM

## 2016-10-31 DIAGNOSIS — E1122 Type 2 diabetes mellitus with diabetic chronic kidney disease: Secondary | ICD-10-CM

## 2016-10-31 MED ORDER — GLUCOSE BLOOD VI STRP: ORAL_STRIP | 12 refills | Status: DC

## 2016-11-02 NOTE — Progress Notes (Signed)
Internal Medicine Clinic Attending  Case discussed with Dr. Rivet soon after the resident saw the patient.  We reviewed the resident's history and exam and pertinent patient test results.  I agree with the assessment, diagnosis, and plan of care documented in the resident's note.  

## 2016-11-10 ENCOUNTER — Telehealth: Payer: Self-pay | Admitting: Internal Medicine

## 2016-11-10 NOTE — Telephone Encounter (Signed)
APT. REMINDER CALL, LMTCB °

## 2016-11-11 ENCOUNTER — Encounter: Payer: Self-pay | Admitting: Internal Medicine

## 2016-11-11 ENCOUNTER — Ambulatory Visit (INDEPENDENT_AMBULATORY_CARE_PROVIDER_SITE_OTHER): Payer: Self-pay | Admitting: Internal Medicine

## 2016-11-11 VITALS — BP 188/82 | HR 72 | Temp 98.1°F | Wt 197.0 lb

## 2016-11-11 DIAGNOSIS — E1122 Type 2 diabetes mellitus with diabetic chronic kidney disease: Secondary | ICD-10-CM

## 2016-11-11 DIAGNOSIS — E1165 Type 2 diabetes mellitus with hyperglycemia: Secondary | ICD-10-CM

## 2016-11-11 DIAGNOSIS — N184 Chronic kidney disease, stage 4 (severe): Principal | ICD-10-CM

## 2016-11-11 DIAGNOSIS — Z9114 Patient's other noncompliance with medication regimen: Secondary | ICD-10-CM

## 2016-11-11 DIAGNOSIS — Z87891 Personal history of nicotine dependence: Secondary | ICD-10-CM

## 2016-11-11 DIAGNOSIS — Z79899 Other long term (current) drug therapy: Secondary | ICD-10-CM

## 2016-11-11 DIAGNOSIS — Z6832 Body mass index (BMI) 32.0-32.9, adult: Secondary | ICD-10-CM

## 2016-11-11 DIAGNOSIS — I10 Essential (primary) hypertension: Secondary | ICD-10-CM

## 2016-11-11 DIAGNOSIS — E669 Obesity, unspecified: Secondary | ICD-10-CM

## 2016-11-11 DIAGNOSIS — Z7984 Long term (current) use of oral hypoglycemic drugs: Secondary | ICD-10-CM

## 2016-11-11 DIAGNOSIS — Z992 Dependence on renal dialysis: Secondary | ICD-10-CM

## 2016-11-11 MED ORDER — GLIPIZIDE 5 MG PO TABS: 5.0000 mg | ORAL_TABLET | Freq: Every day | ORAL | 1 refills | Status: DC

## 2016-11-11 MED ORDER — AMLODIPINE BESYLATE 10 MG PO TABS: 10.0000 mg | ORAL_TABLET | Freq: Every day | ORAL | 0 refills | Status: DC

## 2016-11-11 MED ORDER — PRAVASTATIN SODIUM 40 MG PO TABS: 40.0000 mg | ORAL_TABLET | Freq: Every evening | ORAL | 1 refills | Status: DC

## 2016-11-11 MED ORDER — HYDRALAZINE HCL 100 MG PO TABS: 50.0000 mg | ORAL_TABLET | Freq: Three times a day (TID) | ORAL | 0 refills | Status: DC

## 2016-11-11 MED FILL — hydrALAZINE HCL 100 MG TABS: 100 | 30 days supply | Qty: 45 | Fill #0

## 2016-11-11 MED FILL — AMLODIPINE BESYLATE 10 MG T: 10 | 30 days supply | Qty: 30 | Fill #0

## 2016-11-11 NOTE — Progress Notes (Signed)
   CC: Hypertension and diabetes follow-up  HPI:  [name] is a 51 y.o. man with past medical history as noted below who presents today for follow-up of his hypertension and diabetes. His niece Eric Glenn was present today and provided some of the history.  HTN: BP is significantly elevated at 221/90. Repeat blood pressure was 188/82. He reports he ran out of his blood pressure medications about 2 weeks ago. He was prescribed amlodipine 10 mg daily and hydralazine 50 mg 3 times daily. He notes he was only taking the amlodipine when he did have his blood pressure medicines. He denies any chest pain, abdominal pain, nausea or vomiting. He has a mild headache but otherwise feels fine. He has been unable to get his medicines because his Medicaid has expired and he is currently reapplying. He is due for dialysis today.  Type 2 diabetes: Last A1c was 7.3. Patient has not been taking his glipizide 5 mg daily for the past week. He did know when he was taking his glipizide and his blood sugars were in the 200s, but most recently they have been in the 400s. He has his blood sugar checked at HD. He does not have a glucometer due to the cost. He denies polydipsia.  Past Medical History:  Diagnosis Date  . Anemia   . Chronic kidney disease (CKD), stage IV (severe) ([place])   . Diabetes mellitus without complication ([place])   . Diabetic retinopathy ([place])   . High cholesterol   . Hypertension     Review of Systems:   All negative except per history of present illness  Physical Exam:  Vitals:   11/11/16 0955 11/11/16 1038  BP: (!) 221/90 (!) 188/82  Pulse: 77 72  Temp: 98.1 F (36.7 C)   TempSrc: Oral   SpO2: 100%   Weight: 197 lb (89.4 kg)    Repeat BP 188/82  Gen.: Obese man in NAD CV: RRR, M/G/R  Neuro: Alert and oriented 3. EOMI, PERRLA. Smile symmetric. Tongue midline. Strength 5 out of 5 in upper and lower extremities bilaterally. Sensation symmetric and intact.   Assessment  & Plan:   See Encounters Tab for problem based charting.  Patient discussed with Dr. Evette Doffing

## 2016-11-11 NOTE — Patient Instructions (Signed)
General Instructions: - Pick up medications from [facility] for blood pressure - Will have pharmacist talk to you about other options and we will determine cheapest option on where to send other medications  - Follow up in 1 week for blood pressure  Thank you for bringing your medicines today. This helps [name] keep you safe from mistakes.   Progress Toward Treatment Goals:  No flowsheet data found.  Self Care Goals & Plans:  No flowsheet data found.  No flowsheet data found.   Care Management & Community Referrals:  No flowsheet data found.

## 2016-11-12 NOTE — Assessment & Plan Note (Signed)
Blood sugars have been elevated at his dialysis sessions in the 400s. He has been off of his glipizide for at least one week. A new prescription was sent to Eric Glenn for glipizide 5 mg daily which he will restart. He will likely need to have insulin therapy initiated in the near future.

## 2016-11-12 NOTE — Addendum Note (Signed)
Addended by: Lalla Brothers T on: 11/12/2016 02:29 PM   Modules accepted: Level of Service

## 2016-11-12 NOTE — Assessment & Plan Note (Signed)
BP significantly elevated as patient has been off of his blood pressure medicines for 2 weeks. He is also due for dialysis today and this could be contributing to his elevated blood pressure. Expect for his blood pressure to improve after HD. Will have patient pick up amlodipine 10 mg daily and hydralazine 50 mg 3 times daily at the [facility] outpatient pharmacy through the [facility] which will be free for him today. Also discussed his case with Dr. Maudie Mercury who helped [name] determine that Eric Glenn will be the cheapest option to send his medications. He will follow-up in one week for a blood pressure recheck.

## 2016-11-12 NOTE — Progress Notes (Signed)
Internal Medicine Clinic Attending  Case discussed with Dr. Arcelia Jew at the time of the visit.  We reviewed the resident's history and exam and pertinent patient test results.  I agree with the assessment, diagnosis, and plan of care documented in the resident's note.  Patient with severe hypertension, but no symptoms to suggest organ dysfunction, urgency/emergency. He is due for HD today, and I anticipate blood pressure will improve after volume removal. I agree with starting hydralazine to better control HTN pre dialysis. I doubt he will have hypotension during dialysis, but if this does occur he can reduce hydralazine on HD days in the future.

## 2016-11-17 ENCOUNTER — Ambulatory Visit (INDEPENDENT_AMBULATORY_CARE_PROVIDER_SITE_OTHER): Payer: Self-pay | Admitting: Internal Medicine

## 2016-11-17 ENCOUNTER — Encounter: Payer: Self-pay | Admitting: Internal Medicine

## 2016-11-17 VITALS — BP 156/80 | HR 78 | Temp 98.2°F | Ht 64.0 in | Wt 191.5 lb

## 2016-11-17 DIAGNOSIS — I10 Essential (primary) hypertension: Secondary | ICD-10-CM

## 2016-11-17 DIAGNOSIS — N186 End stage renal disease: Secondary | ICD-10-CM

## 2016-11-17 DIAGNOSIS — R21 Rash and other nonspecific skin eruption: Secondary | ICD-10-CM

## 2016-11-17 DIAGNOSIS — Z87891 Personal history of nicotine dependence: Secondary | ICD-10-CM

## 2016-11-17 DIAGNOSIS — E1165 Type 2 diabetes mellitus with hyperglycemia: Secondary | ICD-10-CM

## 2016-11-17 DIAGNOSIS — I120 Hypertensive chronic kidney disease with stage 5 chronic kidney disease or end stage renal disease: Secondary | ICD-10-CM

## 2016-11-17 DIAGNOSIS — E1122 Type 2 diabetes mellitus with diabetic chronic kidney disease: Secondary | ICD-10-CM

## 2016-11-17 DIAGNOSIS — N184 Chronic kidney disease, stage 4 (severe): Secondary | ICD-10-CM

## 2016-11-17 DIAGNOSIS — Z7984 Long term (current) use of oral hypoglycemic drugs: Secondary | ICD-10-CM

## 2016-11-17 DIAGNOSIS — Z79899 Other long term (current) drug therapy: Secondary | ICD-10-CM

## 2016-11-17 DIAGNOSIS — Z992 Dependence on renal dialysis: Secondary | ICD-10-CM

## 2016-11-17 LAB — GLUCOSE, CAPILLARY: GLUCOSE-CAPILLARY: 210 mg/dL — AB (ref 65–99)

## 2016-11-17 MED ORDER — TRIAMCINOLONE ACETONIDE 0.1 % EX CREA: 1.0000 "application " | TOPICAL_CREAM | Freq: Two times a day (BID) | CUTANEOUS | 1 refills | Status: DC

## 2016-11-17 MED ORDER — HYDRALAZINE HCL 100 MG PO TABS: 100.0000 mg | ORAL_TABLET | Freq: Three times a day (TID) | ORAL | 1 refills | Status: DC

## 2016-11-17 NOTE — Patient Instructions (Signed)
Take hydralazine 100mg  three times a day.  Continue your other medications.  Refilled your cream.

## 2016-11-17 NOTE — Assessment & Plan Note (Signed)
DM II is improving after restarting glipizide, running mid 150-200's. Today in low 200.   Continue current glipizide 5mg  daily.

## 2016-11-17 NOTE — Assessment & Plan Note (Signed)
Has hyperpigmented plaques mainly on left lower ext. Had it on the right previously which improved with tiramcinolone cream. Left also was improving but ran out of traimcinolone and it came back. bx showed nonspecific inflammatory eruption.   Wants refill, will refill triamcinolone 0.1% topical cream.

## 2016-11-17 NOTE — Progress Notes (Signed)
Internal Medicine Clinic Attending  Case discussed with Dr. Ahmed at the time of the visit.  We reviewed the resident's history and exam and pertinent patient test results.  I agree with the assessment, diagnosis, and plan of care documented in the resident's note. 

## 2016-11-17 NOTE — Progress Notes (Signed)
   CC: HTN f/up   HPI:  [name] is a 51 y.o. with PMh as listed below is here for HTN f/up   Past Medical History:  Diagnosis Date  . Anemia   . Diabetes mellitus type 2, controlled ([place]) 08/04/2015  . Diabetic retinopathy ([place])   . ESRD (end stage renal disease) ([place]) 03/10/2016  . High cholesterol   . Hypertension     On 11/11/16, BP was 188/82, was off BP med for 2 weeks. Current regimen amlodipine 10mg  daily and hydralazine 50mg  TID. BP remains elevated with this. I called the HD center, does not get hypotensive after HD. He is having leg cramps after HD sessions. He will talk to Dr. Jimmy Footman tomorrow in HD.   DM II - last hgba1c 7.3 two weeks ago, didn't know about taking glipizide but has started back on 5mg  daily. Sugar in HD has improved from 400's to high 100's in HD sessions. Does not have glucometer due to cost.   Review of Systems:   Review of Systems  Constitutional: Negative for chills and fever.  Eyes: Negative for blurred vision and double vision.  Gastrointestinal: Negative for heartburn and nausea.  Neurological: Negative for dizziness.     Physical Exam:  Vitals:   11/17/16 1109  BP: (!) 156/80  Pulse: 78  Temp: 98.2 F (36.8 C)  TempSrc: Oral  SpO2: 100%  Weight: 191 lb 8 oz (86.9 kg)  Height: 5\' 4"  (1.626 m)   Physical Exam  Constitutional: He appears well-developed and well-nourished. No distress.  Cardiovascular: Normal rate and regular rhythm.   Respiratory: Effort normal and breath sounds normal.  Musculoskeletal: Normal range of motion.  AVF on right arm.  Mild trace edema on lower exts. Left lower ext has hyperpigmented plaque on two areas anterior and lateral aspect   Neurological: He is alert.  Skin: He is not diaphoretic.    Assessment & Plan:   See Encounters Tab for problem based charting.  Patient discussed with Dr. Dareen Piano

## 2016-11-17 NOTE — Assessment & Plan Note (Signed)
Vitals:   11/17/16 1109  BP: (!) 156/80  Pulse: 78  Temp: 98.2 F (36.8 C)   BP remains high. Current regimen amlodipine 10mg  daily and hydralazine 50mg  TID. BP remains elevated with this. I called the HD center, does not get hypotensive after HD.   Will inc hydralazine to 100mg  tid.  Cont amlodipine 10mg  daily. f/up in 1 month.

## 2016-12-06 ENCOUNTER — Other Ambulatory Visit: Payer: Self-pay | Admitting: Internal Medicine

## 2016-12-06 DIAGNOSIS — N184 Chronic kidney disease, stage 4 (severe): Principal | ICD-10-CM

## 2016-12-06 DIAGNOSIS — I10 Essential (primary) hypertension: Secondary | ICD-10-CM

## 2016-12-06 DIAGNOSIS — E1122 Type 2 diabetes mellitus with diabetic chronic kidney disease: Secondary | ICD-10-CM

## 2016-12-06 MED FILL — hydrALAZINE HCL 100 MG TABS: 100 | 30 days supply | Qty: 45 | Fill #1

## 2016-12-10 MED FILL — AMLODIPINE BESYLATE 10 MG T: 10 | 90 days supply | Qty: 90 | Fill #0

## 2016-12-21 ENCOUNTER — Encounter: Payer: Self-pay | Admitting: Internal Medicine

## 2016-12-23 ENCOUNTER — Other Ambulatory Visit: Payer: Self-pay | Admitting: Pharmacist

## 2016-12-23 DIAGNOSIS — R21 Rash and other nonspecific skin eruption: Secondary | ICD-10-CM

## 2016-12-23 DIAGNOSIS — N184 Chronic kidney disease, stage 4 (severe): Principal | ICD-10-CM

## 2016-12-23 DIAGNOSIS — I10 Essential (primary) hypertension: Secondary | ICD-10-CM

## 2016-12-23 DIAGNOSIS — E1122 Type 2 diabetes mellitus with diabetic chronic kidney disease: Secondary | ICD-10-CM

## 2016-12-23 DIAGNOSIS — J45909 Unspecified asthma, uncomplicated: Secondary | ICD-10-CM

## 2016-12-23 MED ORDER — TRIAMCINOLONE ACETONIDE 0.1 % EX CREA: 1.0000 "application " | TOPICAL_CREAM | Freq: Two times a day (BID) | CUTANEOUS | 1 refills | Status: DC

## 2016-12-23 MED ORDER — PRAVASTATIN SODIUM 40 MG PO TABS: 40.0000 mg | ORAL_TABLET | Freq: Every evening | ORAL | 1 refills | Status: DC

## 2016-12-23 MED ORDER — HYDRALAZINE HCL 100 MG PO TABS: 100.0000 mg | ORAL_TABLET | Freq: Three times a day (TID) | ORAL | 1 refills | Status: DC

## 2016-12-23 MED ORDER — AMLODIPINE BESYLATE 10 MG PO TABS: 10.0000 mg | ORAL_TABLET | Freq: Every day | ORAL | 3 refills | Status: DC

## 2016-12-23 MED ORDER — ALBUTEROL SULFATE HFA 108 (90 BASE) MCG/ACT IN AERS: 2.0000 | INHALATION_SPRAY | Freq: Four times a day (QID) | RESPIRATORY_TRACT | 1 refills | Status: DC | PRN

## 2016-12-23 MED ORDER — GLIPIZIDE 5 MG PO TABS: 5.0000 mg | ORAL_TABLET | Freq: Every day | ORAL | 1 refills | Status: DC

## 2016-12-23 NOTE — Progress Notes (Signed)
Patient approved for [facility], prescriptions sent.

## 2017-01-10 ENCOUNTER — Ambulatory Visit (INDEPENDENT_AMBULATORY_CARE_PROVIDER_SITE_OTHER): Payer: Self-pay | Admitting: Internal Medicine

## 2017-01-10 VITALS — BP 164/83 | HR 70 | Temp 97.5°F | Ht 64.0 in | Wt 193.9 lb

## 2017-01-10 DIAGNOSIS — Z79899 Other long term (current) drug therapy: Secondary | ICD-10-CM

## 2017-01-10 DIAGNOSIS — I120 Hypertensive chronic kidney disease with stage 5 chronic kidney disease or end stage renal disease: Secondary | ICD-10-CM

## 2017-01-10 DIAGNOSIS — R0602 Shortness of breath: Secondary | ICD-10-CM

## 2017-01-10 DIAGNOSIS — Z7984 Long term (current) use of oral hypoglycemic drugs: Secondary | ICD-10-CM

## 2017-01-10 DIAGNOSIS — N184 Chronic kidney disease, stage 4 (severe): Principal | ICD-10-CM

## 2017-01-10 DIAGNOSIS — E1122 Type 2 diabetes mellitus with diabetic chronic kidney disease: Secondary | ICD-10-CM

## 2017-01-10 DIAGNOSIS — I10 Essential (primary) hypertension: Secondary | ICD-10-CM

## 2017-01-10 DIAGNOSIS — N186 End stage renal disease: Secondary | ICD-10-CM

## 2017-01-10 DIAGNOSIS — Z992 Dependence on renal dialysis: Secondary | ICD-10-CM

## 2017-01-10 LAB — GLUCOSE, CAPILLARY: GLUCOSE-CAPILLARY: 181 mg/dL — AB (ref 65–99)

## 2017-01-10 LAB — POCT GLYCOSYLATED HEMOGLOBIN (HGB A1C): Hemoglobin A1C: 7.9

## 2017-01-10 MED ORDER — CARVEDILOL 6.25 MG PO TABS: 6.2500 mg | ORAL_TABLET | Freq: Two times a day (BID) | ORAL | 11 refills | Status: DC

## 2017-01-10 MED ORDER — GLUCOSE BLOOD VI STRP
ORAL_STRIP | 12 refills | Status: DC
Start: 2017-01-10 — End: 2018-01-16

## 2017-01-10 MED ORDER — TRUE METRIX METER DEVI: 1.0000 | Freq: Four times a day (QID) | 0 refills | Status: DC

## 2017-01-10 MED ORDER — GLIPIZIDE 5 MG PO TABS: 7.5000 mg | ORAL_TABLET | Freq: Every day | ORAL | 1 refills | Status: DC

## 2017-01-10 MED ORDER — LANCETS MISC: 2 refills | Status: DC

## 2017-01-10 MED FILL — TRUEplus LANCETS 30G MISC: 25 days supply | Qty: 100 | Fill #0

## 2017-01-10 MED FILL — glipiZIDE 5 MG TABS: 5 | 30 days supply | Qty: 45 | Fill #0

## 2017-01-10 MED FILL — CARVEDILOL 6.25 MG TABLET: 6.25 | 30 days supply | Qty: 60 | Fill #0

## 2017-01-10 MED FILL — TRUE METRIX GLUCOSE TEST ST: 25 days supply | Qty: 100 | Fill #0

## 2017-01-10 MED FILL — RENA-VITE TABLET: 30 days supply | Qty: 30 | Fill #0

## 2017-01-10 NOTE — Assessment & Plan Note (Signed)
According to patient whenever he goes for his dialysis, his blood  sugar remained elevated mostly between 270 -400 . His previous A1c done in April 2018 was 7.3 today it was 7.9 .might not be correct because of ESRD.  Patient do not have any glucometer meter and does not check his blood sugar at home. He was restarted on glipizide during previous office visit. As patient is technically blind and lives with his niece , he will prefer oral medication for his diabetes as he will on able to inject himself because of his vision.  He was accompanied by his niece today, I provided him with a true matrix glucometer meter prescription as he can get it free at [facility] outpatient pharmacy with [facility]. Patient was on an emergency Medicaid, recently lost the coverage and trying to reactivated because of his visa status.  -He and his niece was advised to check blood sugar at least 3-4 times daily before meal and at bedtime and bring glucometer meter during next follow-up visit in 2 weeks.  -Glipizide was increased to 7.5 mg daily. -We can try adding low-dose Januvia at 25 mg daily and his blood sugar remained uncontrolled.

## 2017-01-10 NOTE — Assessment & Plan Note (Signed)
He is compliant with his dialysis on Tuesday, Thursday and Saturday.  -Continue current scheduled.

## 2017-01-10 NOTE — Assessment & Plan Note (Signed)
BP Readings from Last 3 Encounters:  01/10/17 (!) 164/83  11/17/16 (!) 156/80  11/11/16 (!) 188/82   His blood pressure was elevated, he just took one dose of 100 mg of hydralazine this morning, normally takes amlodipine at bedtime.  -Add Coreg 6.25 mg twice daily to his existing regimen of amlodipine 10 mg daily and hydralazine 100 mg 3 times a day. -Follow-up in 2 weeks.

## 2017-01-10 NOTE — Patient Instructions (Signed)
Thank you for visiting clinic today. As we have discussed, I am increasing the dose of your blood sugar medicine called glipizide from 5 mg to 7.5 mg. Now you will take 1-1/2 tablet daily. I am also adding another medicine for your blood pressure called Coreg, she will take 6.25 mg twice daily. I'm also giving you a prescription for glucometer, please check the blood sugar 4 times a day before meals and at bedtime and bring your glucometer meter with you during next office visit. Please follow-up in 2 weeks and bring your glucometer meter with you.

## 2017-01-10 NOTE — Assessment & Plan Note (Signed)
He was also complaining of exertional dyspnea, orthopnea and PND requiring multiple pillows to keep him upright at night. He was also complaining of intermittent substernal chest pain, nonradiating but increased with deep breathing. There was no pain today.  According to his knees he was advised to get a sleep study in the past, unable to do so because of insurance issues.  An echo done in September 2017 shows normal systolic function with grade 2 diastolic dysfunction.  Her chest was clear and there was no lower extremity edema today.  It might be a combination of sleep apnea and diastolic dysfunction, no current exacerbation. -He should have a sleep study, once get his insurance.

## 2017-01-10 NOTE — Progress Notes (Signed)
   CC: High blood pressure and blood sugar at dialysis Center.  HPI:  Eric Glenn is a 51 y.o. with past medical history as listed below came to the clinic for follow-up of his blood pressure and blood sugar.  History was obtained from his niece who is also acting as his interpreter, according to that each time he goes for his dialysis, his blood pressure and blood sugar remained high so they advised him to see his primary care for a better control of blood sugar and blood pressure. According to patient whenever he goes for dialysis his blood sugar remain at 270- 400, he does not has a glucometer meter at home. He is not aware of his blood pressure readings during dialysis. Currently he is taking amlodipine 10 mg daily and his hydralazine was increased to 100 mg 3 times a day during previous office visit on 11/17/2016.  Patient is also technically blind and do not want to be started on insulin as he will not be able to inject himself. He is currently on glipizide 5 mg daily.   He was complaining of occasional substernal chest pain, worse with deep breathing, no chest pain at this time. He was also complaining of exertional dyspnea, orthopnea and PND. He uses multiple pillows to sleep. In the past he was advised for sleep study, patient is uninsured, trying to get a emergency Medicaid for dialysis, does not qualify for supportive services because of his visa status.  Past Medical History:  Diagnosis Date  . Anemia   . Diabetes mellitus type 2, controlled ([place]) 08/04/2015  . Diabetic retinopathy ([place])   . ESRD (end stage renal disease) ([place]) 03/10/2016  . High cholesterol   . Hypertension     Review of Systems:  AS per HPI> Physical Exam:  Vitals:   01/10/17 1416  BP: (!) 164/83  Pulse: 70  Temp: 97.5 F (36.4 C)  TempSrc: Oral  SpO2: 98%  Weight: 193 lb 14.4 oz (88 kg)  Height: 5\' 4"  (1.626 m)    General: Vital signs reviewed.  Patient is well-developed and  well-nourished, in no acute distress and cooperative with exam.  Cardiovascular: RRR, S1 normal, S2 normal, no murmurs, gallops, or rubs. Pulmonary/Chest: Clear to auscultation bilaterally, no wheezes, rales, or rhonchi. Abdominal: Soft, non-tender, non-distended, BS +, no masses, organomegaly, or guarding present.  Extremities: No lower extremity edema bilaterally,  pulses symmetric and intact bilaterally. No cyanosis or clubbing. Skin: Warm, dry and intact. Small Patches of brown discoloration on anterior and lateral side of left lower leg is appears healing. Psychiatric: Normal mood and affect. speech and behavior is normal. Cognition and memory are normal.  Assessment & Plan:   See Encounters Tab for problem based charting.  Patient discussed with Dr. Daryll Drown.

## 2017-01-11 NOTE — Progress Notes (Signed)
Internal Medicine Clinic Attending  Case discussed with Dr. Reesa Chew at the time of the visit.  We reviewed the resident's history and exam and pertinent patient test results.  I agree with the assessment, diagnosis, and plan of care documented in the resident's note.  Agree with plan.  Chest pain and breathing issues should be further addressed at next visit.  If none recent, could consider EKG to look for changes.  Agree with sleep study.  Depending on timing since last TTE, would consider repeat considering his new symptoms.  If patient does not show to his appointment in 2 weeks, he should be urgently called back.

## 2017-01-24 ENCOUNTER — Ambulatory Visit (INDEPENDENT_AMBULATORY_CARE_PROVIDER_SITE_OTHER): Payer: Self-pay | Admitting: Internal Medicine

## 2017-01-24 VITALS — BP 159/84 | HR 73 | Temp 98.1°F | Ht 64.0 in | Wt 195.1 lb

## 2017-01-24 DIAGNOSIS — I10 Essential (primary) hypertension: Secondary | ICD-10-CM

## 2017-01-24 DIAGNOSIS — Z79899 Other long term (current) drug therapy: Secondary | ICD-10-CM

## 2017-01-24 DIAGNOSIS — Z992 Dependence on renal dialysis: Secondary | ICD-10-CM

## 2017-01-24 DIAGNOSIS — Z87891 Personal history of nicotine dependence: Secondary | ICD-10-CM

## 2017-01-24 DIAGNOSIS — Z8639 Personal history of other endocrine, nutritional and metabolic disease: Secondary | ICD-10-CM

## 2017-01-24 DIAGNOSIS — R252 Cramp and spasm: Secondary | ICD-10-CM

## 2017-01-24 MED ORDER — CARVEDILOL 12.5 MG PO TABS: 12.5000 mg | ORAL_TABLET | Freq: Two times a day (BID) | ORAL | 2 refills | Status: DC

## 2017-01-24 MED ORDER — DIPHENHYDRAMINE HCL 25 MG PO TABS: 12.5000 mg | ORAL_TABLET | ORAL | 0 refills | Status: DC

## 2017-01-24 MED ORDER — PRAVASTATIN SODIUM 40 MG PO TABS: 40.0000 mg | ORAL_TABLET | Freq: Every evening | ORAL | 1 refills | Status: DC

## 2017-01-24 MED FILL — CARVEDILOL 12.5 MG TABLET: 12.5 | 30 days supply | Qty: 60 | Fill #0

## 2017-01-24 MED FILL — PRAVASTATIN NA 40 MG TAB: 40 | 30 days supply | Qty: 30 | Fill #0

## 2017-01-24 NOTE — Assessment & Plan Note (Signed)
Assessment: Essential hypertension Patient was seen in the clinic on 01/10/17 for follow-up of his chronic medical conditions. At that time his blood pressure was elevated at 164/83.  At the time patient was taking hydralazine 100 mg 3 times a day and amlodipine 10 mg daily. Patient was started on Coreg 6.25 mg 2 times a day and to follow-up in the acute care clinic for blood pressure recheck. Today patient's blood pressure is 159/84.  At this time will increase Coreg to 12.5 mg twice daily.  Plan -Carvedilol 12.5 mg twice daily, continue hydralazine 100 mg 3 times a day and amlodipine 10 mg daily

## 2017-01-24 NOTE — Assessment & Plan Note (Signed)
Assessment: Muscle cramps Patient reports muscle cramps during dialysis to the point he wants to cut dialysis short. Will try a trial of Benadryl prior to dialysis. If this does not help with muscle cramping will try a low-dose benzodiazepine such as 0.25-0.5 Ativan prior to dialysis. Patient also has a history of vitamin D deficiency and states he has not taken vitamin D supplements. Will get a vitamin D level to see if this could also be worsening his muscle cramps.  Plan -Benadryl 25 mg prior to dialysis -Vitamin D level

## 2017-01-24 NOTE — Patient Instructions (Signed)
Mr. Harkleroad,  It was a pleasure seeing you today. Please start taking carvedilol 12.5 mg twice a day. Please start taking Benadryl 12.5 mg before dialysis Please follow up in 3 months.

## 2017-01-24 NOTE — Progress Notes (Signed)
   CC: Follow up on essential hypertension  HPI:  [name] is a 51 y.o. male with history noted below that presents to the acute care clinic for follow-up on blood pressure. Please see problem based charting for the status of patient's chronic medical condition.  Past Medical History:  Diagnosis Date  . Anemia   . Diabetes mellitus type 2, controlled ([place]) 08/04/2015  . Diabetic retinopathy ([place])   . ESRD (end stage renal disease) ([place]) 03/10/2016  . High cholesterol   . Hypertension     Review of Systems:  Review of Systems  Respiratory: Negative for cough and shortness of breath.   Cardiovascular: Negative for chest pain and palpitations.  Gastrointestinal: Negative for nausea and vomiting.  Musculoskeletal:       Muscle cramps      Physical Exam:  Vitals:   01/24/17 1502  BP: (!) 159/84  Pulse: 73  Temp: 98.1 F (36.7 C)  TempSrc: Oral  SpO2: 95%  Weight: 195 lb 1.6 oz (88.5 kg)  Height: 5\' 4"  (1.626 m)   Physical Exam  Constitutional: He is well-developed, well-nourished, and in no distress.  Cardiovascular: Normal rate, regular rhythm and normal heart sounds.  Exam reveals no gallop and no friction rub.   No murmur heard. Pulmonary/Chest: Effort normal and breath sounds normal. No respiratory distress. He has no wheezes. He has no rales.  Musculoskeletal: He exhibits no edema.  Skin: Skin is warm and dry.    Assessment & Plan:   See encounters tab for problem based medical decision making.   Patient discussed with Dr. Daryll Drown

## 2017-01-25 NOTE — Progress Notes (Signed)
Internal Medicine Clinic Attending  Case discussed with Dr. Heber [place] at the time of the visit.  We reviewed the resident's history and exam and pertinent patient test results.  I agree with the assessment, diagnosis, and plan of care documented in the resident's note.  Since last being seen, patient denied chest pain and SOB at this visit which was an issue at last visit.  Main issue was severe cramping at HD.  Plan per Dr. Heber [place] and up titration of his BP medication.  Coordination with his nephrologist will be needed given the cramping could be explained by too low dry weight, etc.  Unclear what his BP after dialysis is as well.  Would recommend sending note to his nephrologist to discuss when possible.

## 2017-01-25 NOTE — Addendum Note (Signed)
Addended by: Orson Gear on: 01/25/2017 03:35 PM   Modules accepted: Orders

## 2017-02-08 LAB — VITAMIN D 25 HYDROXY (VIT D DEFICIENCY, FRACTURES)

## 2017-02-15 ENCOUNTER — Encounter: Payer: Self-pay | Admitting: Internal Medicine

## 2017-02-21 MED FILL — PRAVASTATIN NA 40 MG TAB: 40 | 30 days supply | Qty: 30 | Fill #1

## 2017-02-21 MED FILL — glipiZIDE 5 MG TABS: 5 | 30 days supply | Qty: 45 | Fill #1

## 2017-02-21 MED FILL — RENA-VITE TABLET: 30 days supply | Qty: 30 | Fill #1

## 2017-03-08 MED FILL — CARVEDILOL 12.5 MG TABLET: 12.5 | 30 days supply | Qty: 60 | Fill #1

## 2017-03-15 MED FILL — glipiZIDE 5 MG TABS: 5 | 30 days supply | Qty: 45 | Fill #2

## 2017-03-17 MED FILL — OFLOXACIN 0.3% EYE DROPS: 0.3 | 50 days supply | Qty: 10 | Fill #0

## 2017-03-18 MED FILL — PREDNISOLONE AC 1% EYE DROP: 1 | 75 days supply | Qty: 15 | Fill #0

## 2017-03-31 MED FILL — RENA-VITE TABLET: 30 days supply | Qty: 30 | Fill #2

## 2017-04-13 MED FILL — CARVEDILOL 12.5 MG TABLET: 12.5 | 30 days supply | Qty: 60 | Fill #2

## 2017-04-15 ENCOUNTER — Encounter: Payer: Self-pay | Admitting: Internal Medicine

## 2017-04-15 ENCOUNTER — Encounter (INDEPENDENT_AMBULATORY_CARE_PROVIDER_SITE_OTHER): Payer: Self-pay

## 2017-04-15 ENCOUNTER — Ambulatory Visit (INDEPENDENT_AMBULATORY_CARE_PROVIDER_SITE_OTHER): Payer: Self-pay | Admitting: Internal Medicine

## 2017-04-15 VITALS — BP 153/79 | HR 81 | Temp 98.1°F | Wt 192.8 lb

## 2017-04-15 DIAGNOSIS — I10 Essential (primary) hypertension: Secondary | ICD-10-CM

## 2017-04-15 DIAGNOSIS — Z87891 Personal history of nicotine dependence: Secondary | ICD-10-CM

## 2017-04-15 DIAGNOSIS — N184 Chronic kidney disease, stage 4 (severe): Principal | ICD-10-CM

## 2017-04-15 DIAGNOSIS — R252 Cramp and spasm: Secondary | ICD-10-CM

## 2017-04-15 DIAGNOSIS — E1169 Type 2 diabetes mellitus with other specified complication: Secondary | ICD-10-CM

## 2017-04-15 DIAGNOSIS — Z9111 Patient's noncompliance with dietary regimen: Secondary | ICD-10-CM

## 2017-04-15 DIAGNOSIS — I120 Hypertensive chronic kidney disease with stage 5 chronic kidney disease or end stage renal disease: Secondary | ICD-10-CM

## 2017-04-15 DIAGNOSIS — Z992 Dependence on renal dialysis: Secondary | ICD-10-CM

## 2017-04-15 DIAGNOSIS — Z7984 Long term (current) use of oral hypoglycemic drugs: Secondary | ICD-10-CM

## 2017-04-15 DIAGNOSIS — Z79899 Other long term (current) drug therapy: Secondary | ICD-10-CM

## 2017-04-15 DIAGNOSIS — N186 End stage renal disease: Secondary | ICD-10-CM

## 2017-04-15 DIAGNOSIS — R51 Headache: Secondary | ICD-10-CM

## 2017-04-15 DIAGNOSIS — H547 Unspecified visual loss: Secondary | ICD-10-CM

## 2017-04-15 DIAGNOSIS — E1122 Type 2 diabetes mellitus with diabetic chronic kidney disease: Secondary | ICD-10-CM

## 2017-04-15 LAB — GLUCOSE, CAPILLARY: Glucose-Capillary: 417 mg/dL — ABNORMAL HIGH (ref 65–99)

## 2017-04-15 LAB — POCT GLYCOSYLATED HEMOGLOBIN (HGB A1C): HEMOGLOBIN A1C: 9.1

## 2017-04-15 MED ORDER — PRAVASTATIN SODIUM 40 MG PO TABS: 40.0000 mg | ORAL_TABLET | Freq: Every evening | ORAL | 3 refills | Status: DC

## 2017-04-15 MED ORDER — GLIPIZIDE 5 MG PO TABS: 7.5000 mg | ORAL_TABLET | Freq: Every day | ORAL | 1 refills | Status: DC

## 2017-04-15 NOTE — Assessment & Plan Note (Addendum)
BP 163/86 and 153/79 when rechecked. Endorses occasional headaches during hemodialysis, but denies chest pain, shortness of breath, and lower extremity swelling. He is currently on amlodipine 10 MG daily, Coreg 12.5 MG twice a day, and hydralazine 100 MG 3 times a day, and reports compliance. However, on further questioning patient reports noncompliance with fluid restriction and stated he feels too thirsty at times and has to drink water. Explained to patient that this is likely secondary to hyperglycemia from  uncontrolled diabetes. Review of outpatient dialysis paperwork reveals normal blood pressure pre-and post-dialysis. Therefore, will not make adjustments to his blood pressure regimen at this time. Will plan to get into contact with outpatient dialysis nephrologist to discuss further blood pressure management.  - No changes in medications at this time - Will contact outpatient dialysis nephrologist for further assistance with blood pressure management as he had had persistent elevation of his blood pressure during his visits in clinic

## 2017-04-15 NOTE — Patient Instructions (Addendum)
Fue un Medical sales representative.   Su presion hoy esta muy alta 163/86. No hicimos ningun cambio en sus medicinas de la presion ya que su presion es normal durante dialisis.  Me voy a comunicar con su doctor de la dialisis para ver si necesitas cambios en sus medicamentos de la presion.   Su A1c hoy es 9.1. Su meta es estar por debajo de 8. Tiene una cita el lunes, octubre 1 a las 9:15am con la educadora de diabetes para comenzar insulina.   Su doctora primaria es la Midwife.   ---------------------------------------------------------------------------------------------------------------------------------------------------------  Your blood pressure was high today, 163/89. We did not make any changes in your blood pressure medications today as your blood pressure during dialysis seems to be normal.   Your A1c was high at 9.1. You have a scheduled appointment with our diabetes educator on Monday 10/1 to start insulin.   Your primary care doctor's name is Dr. Isac Sarna.

## 2017-04-15 NOTE — Assessment & Plan Note (Addendum)
Patient presents for follow-up of uncontrolled type 2 diabetes. He is currently on glipizide 7.5 mg daily and Januvia 50 mg and reports compliance. He was on insulin in the past, but is now blind and declined starting insulin when last seen in 01/2017 due to being unable to inject it himself. Last A1c 7.9 on 6/25. Today his A1c is 9.1 and his blood glucose is 417. He did not bring his home meter with him for review. He endorses polydipsia and frequent headaches. States he eats tortillas and beans frequently and drinks sodas intermittently. I explained the potential consequences of uncontrolled diabetes and discussed the importance of starting insulin given his increase in A1c in the setting of ESRD (would expect it to be falsely low). Patient is now agreeable to start insulin with the help of his family (daughter present in the room). Unfortunately, Eric Glenn is not here today. We will need her assistance in educating patient and ensuring he will able to afford it as he is uninsured. We scheduled an appointment for him on 10/1 at 9:15am with Eric Glenn.  - Continue glipizide 7.5 MG daily (refilled during this visit) and Januvia 50 MG daily - Refilled pravastatin 40 MG daily - Appointment with diabetes educator on 10/1 with plan to start insulin. He was advised to bring his blood glucose meter to this visit. - Foot exam performed today - Received flu shot at outpatient dialysis center

## 2017-04-15 NOTE — Progress Notes (Signed)
   CC: T2DM and HTN follow up   HPI:  [name] is a 51 y.o. Spanish-speaking male with history as described below who presents to clinic for follow-up of type 2 diabetes and hypertension. Please see problem based assessment and plan for the status of patient's chronic medical conditions.  Past Medical History:  Diagnosis Date  . Anemia   . Diabetes mellitus type 2, controlled ([place]) 08/04/2015  . Diabetic retinopathy ([place])   . ESRD (end stage renal disease) ([place]) 03/10/2016  . High cholesterol   . Hypertension    Review of Systems:   Review of Systems  Constitutional: Negative for chills, fever and weight loss.  Cardiovascular: Negative for chest pain, palpitations and orthopnea.  Musculoskeletal:       Muscle cramps RLE>>LLE  Neurological: Positive for headaches.    Physical Exam:  Vitals:   04/15/17 1532 04/15/17 1623  BP: (!) 163/86 (!) 153/79  Pulse: 77 81  Temp: 98.1 F (36.7 C)   TempSrc: Oral   SpO2: 100%   Weight: 192 lb 12.8 oz (87.5 kg)    General: Pleasant male, well-nourished, well-developed, in no acute distress HENT: NCAT, neck supple and FROM, OP clear without exudates or erythema Eyes: patient is blind Cardiac: regular rate and rhythm, nl S1/S2, no murmurs, rubs or gallops  Pulm: CTAB, no wheezes or crackles, no increased work of breathing  Abd: soft, NTND, bowel sounds present  Neuro: A&Ox3, able to move all 4 extremities Ext: warm and well perfused, no peripheral edema     Assessment & Plan:   See Encounters Tab for problem based charting.  Patient seen with Dr. Rebeca Alert

## 2017-04-18 ENCOUNTER — Ambulatory Visit (INDEPENDENT_AMBULATORY_CARE_PROVIDER_SITE_OTHER): Payer: Self-pay | Admitting: Dietician

## 2017-04-18 DIAGNOSIS — E118 Type 2 diabetes mellitus with unspecified complications: Principal | ICD-10-CM

## 2017-04-18 DIAGNOSIS — Z598 Other problems related to housing and economic circumstances: Secondary | ICD-10-CM

## 2017-04-18 DIAGNOSIS — E1122 Type 2 diabetes mellitus with diabetic chronic kidney disease: Secondary | ICD-10-CM

## 2017-04-18 DIAGNOSIS — E1165 Type 2 diabetes mellitus with hyperglycemia: Secondary | ICD-10-CM

## 2017-04-18 DIAGNOSIS — Z7984 Long term (current) use of oral hypoglycemic drugs: Secondary | ICD-10-CM

## 2017-04-18 DIAGNOSIS — H547 Unspecified visual loss: Secondary | ICD-10-CM

## 2017-04-18 DIAGNOSIS — N186 End stage renal disease: Secondary | ICD-10-CM

## 2017-04-18 DIAGNOSIS — IMO0002 Reserved for concepts with insufficient information to code with codable children: Secondary | ICD-10-CM

## 2017-04-18 LAB — GLUCOSE, CAPILLARY: GLUCOSE-CAPILLARY: 321 mg/dL — AB (ref 65–99)

## 2017-04-18 MED ORDER — INSULIN GLARGINE 100 UNIT/ML SOLOSTAR PEN: 18.0000 [IU] | PEN_INJECTOR | Freq: Every day | SUBCUTANEOUS | 3 refills | Status: DC

## 2017-04-18 MED ORDER — SITAGLIPTIN PHOSPHATE 25 MG PO TABS: 25.0000 mg | ORAL_TABLET | Freq: Every day | ORAL | 11 refills | Status: DC

## 2017-04-18 NOTE — Progress Notes (Signed)
Diabetes Self-Management Education  Visit Type: (P) First/Initial  Appt. Start Time: 915 Appt. End Time: 1055  04/18/2017  Eric Glenn, identified by name and date of birth, is a 51 y.o. male with a diagnosis of Diabetes: (P) Type 2. 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 Joni Reining who agrees to have Mr. Severin stop the glipizide today when he begins his insulin (lantus 0.2units/kg) and decrease his dose of Januvia to 25 mg daily.  Using weight of 193#(88kg) his lantus dose is 18 units daily. Using interpreter [phone] and his niece ans sister who were also in attendance. Patient was taught how to use the lantus insulin pen, target blood sugar of 100-200, signs and symptoms of hypoglycemia and how to prevent and treat, insulin storage and injection sites and proper rotation.  Of concern is he is uninsured. His niece says he has emergency medicaid that does not cover the cost of medicine. The pen is preferable, but since sister and niece feel he will not do his own injections but rely on family members, they could be taught to use the vial and syringe.  He has a follow up for 1 week and is to bring back blood sugars and meet with pharmacy at that time if possible. They use the [facility] pharmacy for his januvia prescription.       Diabetes Self-Management Education - 04/18/17 1100      Visit Information   Visit Type (P)  First/Initial     Initial Visit   Diabetes Type (P)  Type 2   Are you currently following a meal plan? (P)  No   Are you taking your medications as prescribed? (P)  Yes     Health Coping   How would you rate your overall health? (P)  Good     Psychosocial Assessment   Patient Belief/Attitude about Diabetes (P)  Motivated to manage diabetes   Self-care barriers (P)  Impaired vision;English as a second language;Lack of material resources   Self-management support (P)  Doctor's  office;Family;CDE visits   Other persons present (P)  Patient;Interpreter;Family Member   Patient Concerns (P)  Nutrition/Meal planning;Medication   Special Needs (P)  Verbal instruction;Instruct caregiver   Preferred Learning Style (P)  Auditory;Hands on   Learning Readiness (P)  Ready   How often do you need to have someone help you when you read instructions, pamphlets, or other written materials from your doctor or pharmacy? (P)  5 - Always     Pre-Education Assessment   Patient understands incorporating nutritional management into lifestyle. (P)  Needs Instruction   Patient understands using medications safely. (P)  Needs Instruction   Patient understands monitoring blood glucose, interpreting and using results (P)  Needs Instruction   Patient understands prevention, detection, and treatment of acute complications. (P)  Needs Instruction     Complications   Last HgB A1C per patient/outside source (P)  9.1 %   How often do you check your blood sugar? (P)  1-2 times/day   Fasting Blood glucose range (mg/dL) (P)  >200   Postprandial Blood glucose range (mg/dL) (P)  >200   Number of hypoglycemic episodes per month (P)  0   Number of hyperglycemic episodes per week (P)  100   Can you tell when your blood sugar is high? (P)  Yes      Individualized Plan for Diabetes Self-Management Training:   Learning Objective:  Patient  will have a greater understanding of diabetes self-management. Patient education plan is to attend individual and/or group sessions per assessed needs and concerns. His  Diabetes support plan is to ask family for help.  Plan:   Patient Instructions  Stop Glipizide  Stop the januvia 50 mg and start Januvia 25 mg daily  Begin injecting 18 units LANTUS one time a day about the same time each day.   Check morning blood sugar each day and record and bring to next visit in 1 week.   Expected Outcomes:   good Education material provided: insulin pen instructions,  carb counting book in English requested by niece.sample lantus solostar, sample pen needles, sample needle discard container  If problems or questions, patient to contact team via:  Phone  Future DSME appointment:  1 week [place], [place] 04/18/2017 2:14 PM.

## 2017-04-18 NOTE — Progress Notes (Signed)
Discussed with [facility], given ESRD need to reduce Januvia to 25mg  daily. As he has ESRD and is starting Insulin therapy would d/c glipizide.  Agree with .2u/kg starting dose- 18 units daily of lantus.

## 2017-04-18 NOTE — Patient Instructions (Addendum)
Stop Glipizide  Stop the januvia 50 mg and start Januvia 25 mg daily  Begin injecting 18 units LANTUS one time a day about the same time each day.   Check morning blood sugar each day and record and bring to next visit in 1 week.

## 2017-04-18 NOTE — Addendum Note (Signed)
Addended by: Joni Reining C on: 04/18/2017 04:11 PM   Modules accepted: Orders

## 2017-04-19 NOTE — Progress Notes (Signed)
Internal Medicine Clinic Attending  I saw and evaluated the patient.  I personally confirmed the key portions of the history and exam documented by Dr. Isac Sarna and I reviewed pertinent patient test results.  The assessment, diagnosis, and plan were formulated together and I agree with the documentation in the resident's note.  Fortunately, he is agreeable to starting insulin therapy and will be able to be assisted by his daughter in this. However, her diabetes educator and pharmacist are not here today, and we will require assistance from both of them and helping him get started. He will return early next week to meet with both of them.  Oda Kilts, MD

## 2017-04-25 ENCOUNTER — Ambulatory Visit (INDEPENDENT_AMBULATORY_CARE_PROVIDER_SITE_OTHER): Payer: Self-pay | Admitting: Dietician

## 2017-04-25 ENCOUNTER — Ambulatory Visit: Payer: Self-pay | Admitting: Pharmacist

## 2017-04-25 DIAGNOSIS — E1165 Type 2 diabetes mellitus with hyperglycemia: Secondary | ICD-10-CM

## 2017-04-25 DIAGNOSIS — E118 Type 2 diabetes mellitus with unspecified complications: Principal | ICD-10-CM

## 2017-04-25 DIAGNOSIS — IMO0002 Reserved for concepts with insufficient information to code with codable children: Secondary | ICD-10-CM

## 2017-04-25 MED ORDER — SITAGLIPTIN PHOSPHATE 25 MG PO TABS: 25.0000 mg | ORAL_TABLET | Freq: Every day | ORAL | 11 refills | Status: DC

## 2017-04-25 MED FILL — JANUVIA 25 MG TABLET: 25 | 30 days supply | Qty: 30 | Fill #0

## 2017-04-25 NOTE — Patient Instructions (Signed)
Good job recording blood sugars and taking insulin!  What you are eating is making a difference because your eight is about the same and your blood sugars are much improved!  The Chart says you have 12 refills on your test strips. You can call the outpatient pharmacy to reorder. [phone]  In addition, the pharmacy may be able to transfer the Januvia prescription.  You can look for a coupon online t see if you can get a discount on pen needles if needed.   Please make and appointment with me for diabetes self management training or nutrition anytime you need to or in 4-6 months.   Call anytime  [name] [phone]

## 2017-04-25 NOTE — Progress Notes (Signed)
Diabetes Self-Management Education  Visit Type:  (P) Follow-up  Appt. Start Time: 1015 Appt. End Time: 8916  04/25/2017  Eric Glenn, identified by name and date of birth, is a 51 y.o. male with a diagnosis of Diabetes: (P) Type 2.   ASSESSMENT  Weight 192 lb 14.4 oz (87.5 kg). Body mass index is 33.11 kg/m.       Diabetes Self-Management Education - 04/25/17 1600      Health Coping   How would you rate your overall health? (P)  Excellent      Learning Objective:  Patient will have a greater understanding of diabetes self-management. Patient education plan is to attend individual and/or group sessions per assessed needs and concerns. My plan to support myself in continuing these changes to care for my diabetes is to attend or contact:   His family is a great support Diabetes Support Groups Type 2 diabetes support group : 2nd Monday of every month from 6-7 PM at [address] [place] [facility] conference room [phone] And  local support resources -doctor's office, CDE, Dietitian, pharmacist, church  .Plan:   Patient Instructions  Good job recording blood sugars and taking insulin!  What you are eating is making a difference because your eight is about the same and your blood sugars are much improved!  The Chart says you have 12 refills on your test strips. You can call the outpatient pharmacy to reorder. [phone]  In addition, the pharmacy may be able to transfer the Januvia prescription.  You can look for a coupon online t see if you can get a discount on pen needles if needed.   Please make and appointment with me for diabetes self management training or nutrition anytime you need to or in 4-6 months.   Call anytime  [name] [phone]   Expected Outcomes:    expect good outcomes Education material provided: My Plate  If problems or questions, patient to contact team via:  Phone  Future DSME appointment: -  6 months [place], [place] 04/25/2017 4:52 PM.

## 2017-05-03 MED FILL — TRUE METRIX GLUCOSE TEST ST: 25 days supply | Qty: 100 | Fill #1

## 2017-05-03 MED FILL — RENA-VITE TABLET: 30 days supply | Qty: 30 | Fill #3

## 2017-05-03 MED FILL — TRUEplus LANCETS 30G MISC: 25 days supply | Qty: 100 | Fill #1

## 2017-05-04 ENCOUNTER — Other Ambulatory Visit: Payer: Self-pay | Admitting: Pharmacist

## 2017-05-04 DIAGNOSIS — I10 Essential (primary) hypertension: Secondary | ICD-10-CM

## 2017-05-04 MED ORDER — ATORVASTATIN CALCIUM 20 MG PO TABS: 20.0000 mg | ORAL_TABLET | Freq: Every day | ORAL | 3 refills | Status: DC

## 2017-05-04 MED ORDER — CARVEDILOL 12.5 MG PO TABS: 12.5000 mg | ORAL_TABLET | Freq: Two times a day (BID) | ORAL | 0 refills | Status: DC

## 2017-05-04 NOTE — Progress Notes (Addendum)
Patient was approved for [facility] [employer]. Prescriptions were transferred except for insulin and sitagliptin because these 2 are not on formulary for patients without SSN ([facility] these).   Switched pravastatin to atorvastatin due to formulary. Would recommend follow up lipid panel to determine if atorvastatin dose increase is needed. Per ADA, for patients without clinic ASCVD moderate-intensity statin therapy is recommendation (level A). Per ACC/AHA, if 10-year CV risk is > 7.5%, high-intensity statin is recommended, level E recommendation (expert opinion), patient's risk score is 11%.   Other changes we can consider in the future: Combining amlodipine and atorvastatin ([place]), on formulary at [facility] Lisinopril 2.5 mg daily for albuminuria and BP if tolerated.

## 2017-05-16 ENCOUNTER — Other Ambulatory Visit: Payer: Self-pay | Admitting: Internal Medicine

## 2017-05-16 DIAGNOSIS — I10 Essential (primary) hypertension: Secondary | ICD-10-CM

## 2017-05-18 MED ORDER — CARVEDILOL 12.5 MG PO TABS: 12.5000 mg | ORAL_TABLET | Freq: Two times a day (BID) | ORAL | 0 refills | Status: DC

## 2017-05-20 ENCOUNTER — Other Ambulatory Visit: Payer: Self-pay | Admitting: Internal Medicine

## 2017-05-20 DIAGNOSIS — I10 Essential (primary) hypertension: Secondary | ICD-10-CM

## 2017-05-23 MED ORDER — HYDRALAZINE HCL 100 MG PO TABS: 100.0000 mg | ORAL_TABLET | Freq: Three times a day (TID) | ORAL | 1 refills | Status: DC

## 2017-06-03 IMAGING — NM NM PULMONARY VENT & PERF
10 series · 10 of 10 positions shown · non-contrast
Comparison: CT chest 08/29/2015.  PA and lateral chest today.

CLINICAL DATA: Chronic shortness of breath. Chest pain. Dialysis
patient.

EXAM:
NUCLEAR MEDICINE VENTILATION - PERFUSION LUNG SCAN
TECHNIQUE: Ventilation images were obtained in multiple projections using
inhaled aerosol Cc-XXm DTPA. Perfusion images were obtained in
multiple projections after intravenous injection of Cc-XXm MAA.
RADIOPHARMACEUTICALS:  31.2 mCi 6echnetium-66m DTPA aerosol
inhalation and 3.1 mCi 6echnetium-66m MAA IV

[Series 1: ant/post vent · 4.14mm/px · 1 of 1 slices shown (1 of 2)]
[im 1/1]
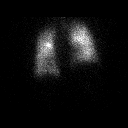

[Series 1: ant/post vent · 4.14mm/px · 1 of 1 slices shown (2 of 2)]
[im 1/1]
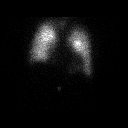

[Series 2: lao/rpo vent · 4.14mm/px · 1 of 1 slices shown (1 of 2)]
[im 1/1]
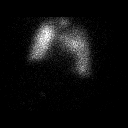

[Series 2: lao/rpo vent · 4.14mm/px · 1 of 1 slices shown (2 of 2)]
[im 1/1  full-range]
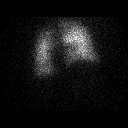

[Series 3: lpo/rao vent · 4.14mm/px · 1 of 1 slices shown (1 of 2)]
[im 1/1  full-range]
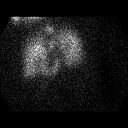

[Series 3: lpo/rao vent · 4.14mm/px · 1 of 1 slices shown (2 of 2)]
[im 1/1]
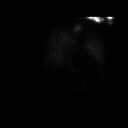

[Series 4: lpo/rao perf · 4.14mm/px · 1 of 1 slices shown (1 of 2)]
[im 1/1]
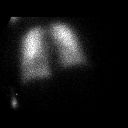

[Series 4: lpo/rao perf · 4.14mm/px · 1 of 1 slices shown (2 of 2)]
[im 1/1]
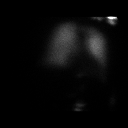

[Series 5: ant/post perf · 4.14mm/px · 1 of 1 slices shown (1 of 2)]
[im 1/1]
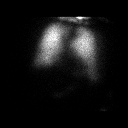

[Series 5: ant/post perf · 4.14mm/px · 1 of 1 slices shown (2 of 2)]
[im 1/1]
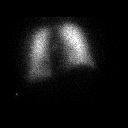

[10 of 10 positions shown; findings below may reference images not displayed]

FINDINGS: Ventilation: No focal ventilation defect. The patient had difficulty
cooperating for the examination resulting in a somewhat limited
study. No lateral images are provided.

Perfusion: No wedge shaped peripheral perfusion defects to suggest
acute pulmonary embolism.
IMPRESSION: Somewhat limited study without evidence of pulmonary embolus. No
acute finding.

## 2017-06-08 MED FILL — RENA-VITE TABLET: 30 days supply | Qty: 30 | Fill #4

## 2017-06-08 MED FILL — JANUVIA 25 MG TABLET: 25 | 30 days supply | Qty: 30 | Fill #1

## 2017-06-22 ENCOUNTER — Other Ambulatory Visit: Payer: Self-pay | Admitting: *Deleted

## 2017-06-22 ENCOUNTER — Ambulatory Visit: Payer: Self-pay | Admitting: Internal Medicine

## 2017-06-22 ENCOUNTER — Other Ambulatory Visit: Payer: Self-pay

## 2017-06-22 VITALS — BP 149/80 | HR 82 | Temp 97.6°F | Ht 64.0 in | Wt 192.0 lb

## 2017-06-22 DIAGNOSIS — I10 Essential (primary) hypertension: Secondary | ICD-10-CM

## 2017-06-22 DIAGNOSIS — E119 Type 2 diabetes mellitus without complications: Secondary | ICD-10-CM

## 2017-06-22 DIAGNOSIS — Z87891 Personal history of nicotine dependence: Secondary | ICD-10-CM

## 2017-06-22 DIAGNOSIS — J329 Chronic sinusitis, unspecified: Secondary | ICD-10-CM

## 2017-06-22 DIAGNOSIS — H6123 Impacted cerumen, bilateral: Secondary | ICD-10-CM | POA: Insufficient documentation

## 2017-06-22 MED ORDER — CARVEDILOL 12.5 MG PO TABS: 12.5000 mg | ORAL_TABLET | Freq: Two times a day (BID) | ORAL | 0 refills | Status: DC

## 2017-06-22 MED ORDER — CARBAMIDE PEROXIDE 6.5 % OT SOLN: 5.0000 [drp] | Freq: Two times a day (BID) | OTIC | 1 refills | Status: AC

## 2017-06-22 MED ORDER — FLUTICASONE PROPIONATE 50 MCG/ACT NA SUSP: 1.0000 | Freq: Every day | NASAL | 2 refills | Status: DC

## 2017-06-22 MED ORDER — INSULIN GLARGINE 100 UNIT/ML SOLOSTAR PEN: 18.0000 [IU] | PEN_INJECTOR | Freq: Every day | SUBCUTANEOUS | 1 refills | Status: DC

## 2017-06-22 MED FILL — FLUTICASONE PROP 50 MCG SPR: 50 | 30 days supply | Qty: 16 | Fill #0

## 2017-06-22 NOTE — Assessment & Plan Note (Addendum)
Patient complains of three-day history of nasal and sinus congestion. He also complains of a frontal headache as well. He denies any fevers, chills, cough, runny nose, sore throat or GI issues. He is afebrile today and examination was without any sinus tenderness. Given his diabetes and hypertension I'm hesitant to start a decongestant like Sudafed. We'll try intranasal steroids and monitor for response. He does not appear infected and in addition it's been less than 10-14 days and symptom onset so antibiotics are not indicated. -Trial of Flonase -Recommended saline irrigation and demonstrated technique -Recommended and demonstrated sinus effleurage

## 2017-06-22 NOTE — Assessment & Plan Note (Signed)
Patient symptoms of decreased hearing and painless ear fullness along with physical exam findings of excessive earwax lead me to believe that this is related to excess cerumen. Patient reports he's had eardrops for in the past which worked well. I offered irrigation in clinic however he was uninterested. -Debrox drops recommended for bilateral ears -Patient to return to clinic in 2 weeks if symptoms do not improve

## 2017-06-22 NOTE — Telephone Encounter (Signed)
Pt's niece calls and states pt has not gotten his insulin yet, since 10/1, she states [facility], checked [facility] went to [facility]. Removed [facility] and [facility] from his profile I have resent the scripts to md for approval Also she states pt is not able to hear out of one ear, appt [facility] given for this pm

## 2017-06-22 NOTE — Telephone Encounter (Signed)
Thanks Helen  

## 2017-06-22 NOTE — Progress Notes (Signed)
   CC: Can't hear out of left ear for 3 weeks, 3 days of congestion  HPI:  Mr.Strider [name] is a 51 y.o. male with diabetes and hypertension here with 2 complaints. He reports a three-week history of fullness and difficulty hearing out of his left ear. There is no pain, drainage, no trauma, no exposures to loud sounds. He had similar history several years ago at which time he was treated with eardrops for excessive earwax with prompt resolution of symptoms. He has not tried any over-the-counter medications at home. He also reports a three-day history of nasal congestion, sinus congestion and frontal headache. He denies any fevers or chills. No runny nose. No sore throat or sneezing. He has not tried any over the outer medications for this as he is worried about the side effects as it relates to his chronic medical conditions.  Past Medical History:  Diagnosis Date  . Anemia   . Diabetes mellitus type 2, controlled ([place]) 08/04/2015  . Diabetic [place] ([place]   . ESRD (end stage renal disease) ([place]) 03/10/2016  . High cholesterol   . Hypertension    Review of Systems:   General: Denies fevers, chills, weight loss HEENT: +HA, ear fullness. +Difficulty hearing. No ear pain. Denies changes in vision, sore throat Cardiac: Denies CP, SOB Pulmonary: Denies cough, wheezes Abd: Denies changes in bowels Extremities: Denies weakness or swelling  Physical Exam: General: Alert, in no acute distress. Pleasant and conversant. Daughter and granddaughter present.  HEENT: BL ear canals with quite a bit of cerumen, L>R. No pain with manipulation of ear. No injection. No hoarseness or dysarthria. No tonsillar exudates. No frontal or maxillary sinus tenderness on palpation. Cardiac: RRR, no MGR appreciated Pulmonary: CTA BL with normal WOB on RA. Able to speak in complete sentences Abd: Soft, non-tender. +bs  Vitals:   06/22/17 1546  BP: (!) 149/80  Pulse: 82  Temp: 97.6 F (36.4 C)    TempSrc: Oral  SpO2: 99%  Weight: 192 lb (87.1 kg)  Height: 5\' 4"  (1.626 m)   Assessment & Plan:   See Encounters Tab for problem based charting.  Patient discussed with Dr. Dareen Piano

## 2017-06-22 NOTE — Patient Instructions (Signed)
It was good seeing you today!  I'm sorry you are having issues with your ear and sinuses. I believe your hearing issues are due to having lots of ear wax in your ear canal. For this I have prescribed ear drops to be used daily. These are called Debrox drops and can also be purchased over the counter.   There could also be a component of sinus congestion playing a role which I think will improve when you treat your congestion. For this I would like you to use a nasal spray called Flonase. Use 1-2 sprays daily. You can also try saline rinses as well.   Please return to clinic if your symptoms do not improve after 1-2 weeks.

## 2017-06-23 MED FILL — CARVEDILOL 12.5 MG TABLET: 12.5 | 30 days supply | Qty: 60 | Fill #0

## 2017-06-23 MED FILL — UNIFINE PENTIPS 31GX3/16: 31G X 5 MM | 30 days supply | Qty: 100 | Fill #0

## 2017-06-23 MED FILL — UNIFINE PENTIPS 31GX3/16": 31G X 5 MM | 30 days supply | Qty: 100 | Fill #0

## 2017-06-23 MED FILL — LANTUS SOLOSTAR 100 UNITS/M: 100 | 30 days supply | Qty: 6 | Fill #0

## 2017-06-23 NOTE — Progress Notes (Signed)
Internal Medicine Clinic Attending  Case discussed with Dr. Molt at the time of the visit.  We reviewed the resident's history and exam and pertinent patient test results.  I agree with the assessment, diagnosis, and plan of care documented in the resident's note. 

## 2017-06-28 ENCOUNTER — Other Ambulatory Visit: Payer: Self-pay | Admitting: Pharmacist

## 2017-06-28 DIAGNOSIS — E118 Type 2 diabetes mellitus with unspecified complications: Principal | ICD-10-CM

## 2017-06-28 DIAGNOSIS — E1165 Type 2 diabetes mellitus with hyperglycemia: Secondary | ICD-10-CM

## 2017-06-28 DIAGNOSIS — IMO0002 Reserved for concepts with insufficient information to code with codable children: Secondary | ICD-10-CM

## 2017-06-28 MED ORDER — BASAGLAR KWIKPEN 100 UNIT/ML ~~LOC~~ SOPN: 18.0000 [IU] | PEN_INJECTOR | Freq: Every day | SUBCUTANEOUS | 1 refills | Status: DC

## 2017-06-28 MED ORDER — SITAGLIPTIN PHOSPHATE 25 MG PO TABS: 25.0000 mg | ORAL_TABLET | Freq: Every day | ORAL | 11 refills | Status: DC

## 2017-06-28 NOTE — Progress Notes (Signed)
Formulary change from lantus to basaglar

## 2017-07-08 ENCOUNTER — Ambulatory Visit: Payer: Self-pay

## 2017-07-14 ENCOUNTER — Telehealth: Payer: Self-pay

## 2017-07-14 MED FILL — JANUVIA 25 MG TABLET: 25 | 30 days supply | Qty: 30 | Fill #2

## 2017-07-14 NOTE — Telephone Encounter (Signed)
Spoke w/ niece who is caregiver, she ask for refills on renal med and that the dialysis doctor changed his insulin, she is ask to call the kidney md for new scripts, if she has a problem she may call triage back, she is agreeable. Just an [place]

## 2017-07-14 NOTE — Telephone Encounter (Signed)
Please call pt back about meds.  

## 2017-07-15 ENCOUNTER — Other Ambulatory Visit: Payer: Self-pay | Admitting: Internal Medicine

## 2017-07-15 ENCOUNTER — Telehealth: Payer: Self-pay | Admitting: *Deleted

## 2017-07-15 DIAGNOSIS — R21 Rash and other nonspecific skin eruption: Secondary | ICD-10-CM

## 2017-07-15 DIAGNOSIS — N184 Chronic kidney disease, stage 4 (severe): Principal | ICD-10-CM

## 2017-07-15 DIAGNOSIS — I10 Essential (primary) hypertension: Secondary | ICD-10-CM

## 2017-07-15 DIAGNOSIS — E1122 Type 2 diabetes mellitus with diabetic chronic kidney disease: Secondary | ICD-10-CM

## 2017-07-15 NOTE — Telephone Encounter (Signed)
Thank you :)

## 2017-07-15 NOTE — Telephone Encounter (Signed)
Glipizide was approved & was sent to pharmacy. Found out med was d/cd 04/18/17 (per Dr. Heber [place]). Spoke to [employer] & clarified that med was dc/d & patient's caregiver stopped by today & was given lantus samples. [name] also stated patient now taking 28 units lantus.  Tried to call pharmacy to cancel glipizide but is now closed.  Called niece/caregiver & instructed not to give glipizide to patient. She's aware that med has been d/cd & verbalized understanding.

## 2017-07-15 NOTE — Telephone Encounter (Signed)
Agree. The niece is confused about his insulin and would like clarification. Note sent to Dr. Maudie Mercury.

## 2017-07-15 NOTE — Telephone Encounter (Signed)
Pt's niece here at the clinic; states pt's kidney doctor has changed insulin dose from 18 units to 28 units. And when she went to the [facility], the cost was $300.00 - rx needs to include "IM Program". But Basaglar was sent to [facility] according to chart. I talked to [facility] about pt's situation - viewed his chart; decided to give pt a sample of Lantus insulin until he comes back for his appt Thursday in [facility] also see Mannie Stabile. Niece states BS have been between 120's to 230's in the mornings.

## 2017-07-16 NOTE — Telephone Encounter (Signed)
Thanks for letting me know, [place] R[place]!

## 2017-07-18 ENCOUNTER — Other Ambulatory Visit: Payer: Self-pay | Admitting: Pharmacist

## 2017-07-18 DIAGNOSIS — E1165 Type 2 diabetes mellitus with hyperglycemia: Secondary | ICD-10-CM

## 2017-07-18 DIAGNOSIS — E118 Type 2 diabetes mellitus with unspecified complications: Principal | ICD-10-CM

## 2017-07-18 DIAGNOSIS — IMO0002 Reserved for concepts with insufficient information to code with codable children: Secondary | ICD-10-CM

## 2017-07-18 MED ORDER — BASAGLAR KWIKPEN 100 UNIT/ML ~~LOC~~ SOPN: 28.0000 [IU] | PEN_INJECTOR | Freq: Every day | SUBCUTANEOUS | 3 refills | Status: DC

## 2017-07-21 ENCOUNTER — Encounter: Payer: Self-pay | Admitting: Internal Medicine

## 2017-07-21 ENCOUNTER — Ambulatory Visit: Payer: Self-pay | Admitting: Pharmacist

## 2017-07-21 ENCOUNTER — Ambulatory Visit (INDEPENDENT_AMBULATORY_CARE_PROVIDER_SITE_OTHER): Payer: Self-pay | Admitting: Internal Medicine

## 2017-07-21 VITALS — BP 144/91 | HR 65 | Temp 98.0°F | Wt 197.2 lb

## 2017-07-21 DIAGNOSIS — I10 Essential (primary) hypertension: Secondary | ICD-10-CM

## 2017-07-21 DIAGNOSIS — E1165 Type 2 diabetes mellitus with hyperglycemia: Secondary | ICD-10-CM

## 2017-07-21 DIAGNOSIS — IMO0002 Reserved for concepts with insufficient information to code with codable children: Secondary | ICD-10-CM

## 2017-07-21 DIAGNOSIS — N186 End stage renal disease: Secondary | ICD-10-CM

## 2017-07-21 DIAGNOSIS — Z79899 Other long term (current) drug therapy: Secondary | ICD-10-CM

## 2017-07-21 DIAGNOSIS — E118 Type 2 diabetes mellitus with unspecified complications: Principal | ICD-10-CM

## 2017-07-21 DIAGNOSIS — E1122 Type 2 diabetes mellitus with diabetic chronic kidney disease: Secondary | ICD-10-CM

## 2017-07-21 DIAGNOSIS — Z992 Dependence on renal dialysis: Secondary | ICD-10-CM

## 2017-07-21 DIAGNOSIS — I120 Hypertensive chronic kidney disease with stage 5 chronic kidney disease or end stage renal disease: Secondary | ICD-10-CM

## 2017-07-21 DIAGNOSIS — H11003 Unspecified pterygium of eye, bilateral: Secondary | ICD-10-CM

## 2017-07-21 DIAGNOSIS — Z794 Long term (current) use of insulin: Secondary | ICD-10-CM

## 2017-07-21 LAB — GLUCOSE, CAPILLARY: Glucose-Capillary: 199 mg/dL — ABNORMAL HIGH (ref 65–99)

## 2017-07-21 LAB — POCT GLYCOSYLATED HEMOGLOBIN (HGB A1C): HEMOGLOBIN A1C: 7.9

## 2017-07-21 MED ORDER — AMLODIPINE BESYLATE 10 MG PO TABS: 10.0000 mg | ORAL_TABLET | Freq: Every day | ORAL | 0 refills | Status: DC

## 2017-07-21 MED ORDER — ATORVASTATIN CALCIUM 20 MG PO TABS: 20.0000 mg | ORAL_TABLET | Freq: Every day | ORAL | 0 refills | Status: DC

## 2017-07-21 MED ORDER — HYDRALAZINE HCL 100 MG PO TABS: 100.0000 mg | ORAL_TABLET | Freq: Three times a day (TID) | ORAL | 0 refills | Status: DC

## 2017-07-21 MED FILL — ATORVASTATIN 20 MG TABLET: 20 | 30 days supply | Qty: 30 | Fill #0

## 2017-07-21 MED FILL — AMLODIPINE BESYLATE 10 MG T: 10 | 30 days supply | Qty: 30 | Fill #0

## 2017-07-21 MED FILL — hydrALAZINE HCL 100 MG TABS: 100 | 30 days supply | Qty: 90 | Fill #0

## 2017-07-21 NOTE — Progress Notes (Signed)
   CC: Follow-up on uncontrolled type 2 diabetes  HPI:  [name] is a 52 y.o. male with history noted below that presents to the acute care clinic for follow-up on uncontrolled type 2 diabetes. Patient has questions about his current regimen for diabetes management.  Please see problem based charting for the status of patient's chronic medical conditions.  Past Medical History:  Diagnosis Date  . Anemia   . Diabetes mellitus type 2, controlled ([place]) 08/04/2015  . Diabetic retinopathy ([place])   . ESRD (end stage renal disease) ([place]) 03/10/2016  . High cholesterol   . Hypertension     Review of Systems:  Review of Systems  Constitutional: Positive for malaise/fatigue. Negative for chills and fever.  Respiratory: Negative for shortness of breath.   Cardiovascular: Negative for chest pain.  Gastrointestinal: Negative for nausea and vomiting.     Physical Exam:  Vitals:   07/21/17 0904  BP: (!) 144/91  Pulse: 65  Temp: 98 F (36.7 C)  TempSrc: Oral  SpO2: 98%  Weight: 197 lb 3.2 oz (89.4 kg)   Physical Exam  Constitutional: He is well-developed, well-nourished, and in no distress.  Eyes:  Pterygium medial growth bilaterally, covering approximately 1/5 of each eye  Cardiovascular: Normal rate, regular rhythm and normal heart sounds. Exam reveals no gallop and no friction rub.  No murmur heard. Pulmonary/Chest: Effort normal and breath sounds normal. No respiratory distress. He has no wheezes. He has no rales.    Assessment & Plan:   See encounters tab for problem based medical decision making.    Patient discussed with Dr. Evette Doffing

## 2017-07-21 NOTE — Patient Instructions (Addendum)
Mr. Schnelle,  It was a pleasure seeing you today.  Please follow up with your kidney doctor.  Please continue to take your insulin at 28 units daily and Januvia 25 mg daily. Please keep a log  of your glucose readings and bring them to your next appointment.

## 2017-07-21 NOTE — Assessment & Plan Note (Signed)
Assessment: Uncontrolled type 2 diabetes Patient saw diabetic coordinator, [name] on 04/18/2017 and diabetic regimen consisted of Januvia 25 mg daily, discontinuing glipizide and starting Lantus at 18 units daily. Today patient states that at his dialysis center the kidney doctor recommended he increase Lantus to 28 units. He has been taking 28 units of Lantus for the past 1.5 months. Patient asked if he should be on glipizide and I stated that the medication should be discontinued. He states he has not been taking it. He reports adherence to Januvia 25 mg daily. Previous hemoglobin A1c was 9.1 on 03/2017. Today's hemoglobin A1c is 7.9.  He does not have his glucometer today.  Told patient and daughter to keep a log of patient's glucose readings and follow up with primary provider in 1.42months.  Will continue with current regimen and follow-up with PCP.  Plan -hemoglobin A1C - continue current diabetes regimen - follow up in 1.5 months with provider

## 2017-07-21 NOTE — Assessment & Plan Note (Signed)
Assessment:  Essential hypertension Today blood pressure is stable at 144/91.  Patient's current blood pressure regimen includes amlodipine 10 MG daily, Coreg 12.5 MG twice a day, and hydralazine 100 MG 3 times a day.  He states he has been out of amlodipine.  Will refill amlodipine.    Plan -continue carvedilol, hydralazine and refill amlodipine

## 2017-07-22 NOTE — Progress Notes (Signed)
Internal Medicine Clinic Attending  Case discussed with Dr. Hoffman at the time of the visit.  We reviewed the resident's history and exam and pertinent patient test results.  I agree with the assessment, diagnosis, and plan of care documented in the resident's note.  

## 2017-07-25 ENCOUNTER — Other Ambulatory Visit: Payer: Self-pay | Admitting: Pharmacist

## 2017-07-25 ENCOUNTER — Other Ambulatory Visit: Payer: Self-pay | Admitting: Internal Medicine

## 2017-07-25 DIAGNOSIS — E1165 Type 2 diabetes mellitus with hyperglycemia: Secondary | ICD-10-CM

## 2017-07-25 DIAGNOSIS — E118 Type 2 diabetes mellitus with unspecified complications: Principal | ICD-10-CM

## 2017-07-25 DIAGNOSIS — IMO0002 Reserved for concepts with insufficient information to code with codable children: Secondary | ICD-10-CM

## 2017-07-25 MED ORDER — INSULIN DETEMIR 100 UNIT/ML FLEXPEN: 28.0000 [IU] | PEN_INJECTOR | Freq: Every day | SUBCUTANEOUS | 11 refills | Status: DC

## 2017-07-25 MED ORDER — BASAGLAR KWIKPEN 100 UNIT/ML ~~LOC~~ SOPN: 28.0000 [IU] | PEN_INJECTOR | Freq: Every day | SUBCUTANEOUS | 3 refills | Status: DC

## 2017-07-25 MED FILL — LEVEMIR FLEXTOUCH 100 UNITS: 100 | 30 days supply | Qty: 9 | Fill #0

## 2017-07-25 NOTE — Telephone Encounter (Signed)
Sent new prescription to [facility] outpatient pharmacy

## 2017-07-25 NOTE — Progress Notes (Addendum)
Formulary switch from [place] to Levemir under IM program. Patient unable to obtain insulin from [facility] pharmacy due to pending SS#

## 2017-07-29 ENCOUNTER — Other Ambulatory Visit: Payer: Self-pay | Admitting: Internal Medicine

## 2017-07-29 MED FILL — RENA-VITE TABLET: 30 days supply | Qty: 30 | Fill #0

## 2017-07-29 NOTE — Telephone Encounter (Signed)
Patient is requesting insulin, outpatient said patient needs another referral

## 2017-07-29 NOTE — Telephone Encounter (Signed)
Lm for rtc 

## 2017-08-03 NOTE — Telephone Encounter (Signed)
Lm for rtc 

## 2017-08-11 NOTE — Telephone Encounter (Signed)
Lm for rtc 

## 2017-08-19 DIAGNOSIS — R110 Nausea: Secondary | ICD-10-CM

## 2017-08-19 HISTORY — DX: Nausea: R11.0

## 2017-08-22 ENCOUNTER — Ambulatory Visit (INDEPENDENT_AMBULATORY_CARE_PROVIDER_SITE_OTHER): Payer: Self-pay | Admitting: Internal Medicine

## 2017-08-22 ENCOUNTER — Encounter: Payer: Self-pay | Admitting: Internal Medicine

## 2017-08-22 ENCOUNTER — Encounter (HOSPITAL_COMMUNITY): Payer: Self-pay | Admitting: General Practice

## 2017-08-22 ENCOUNTER — Other Ambulatory Visit: Payer: Self-pay

## 2017-08-22 ENCOUNTER — Observation Stay (HOSPITAL_COMMUNITY)
Admission: AD | Admit: 2017-08-22 | Discharge: 2017-08-23 | Disposition: A | Discharge status: Home / Self Care | Payer: Self-pay | Source: Ambulatory Visit | Attending: Internal Medicine | Admitting: Internal Medicine

## 2017-08-22 VITALS — BP 133/73 | HR 82 | Temp 98.2°F | Ht 63.0 in | Wt 193.8 lb

## 2017-08-22 DIAGNOSIS — E872 Acidosis: Secondary | ICD-10-CM | POA: Insufficient documentation

## 2017-08-22 DIAGNOSIS — K529 Noninfective gastroenteritis and colitis, unspecified: Secondary | ICD-10-CM | POA: Insufficient documentation

## 2017-08-22 DIAGNOSIS — E1140 Type 2 diabetes mellitus with diabetic neuropathy, unspecified: Secondary | ICD-10-CM | POA: Insufficient documentation

## 2017-08-22 DIAGNOSIS — Z992 Dependence on renal dialysis: Secondary | ICD-10-CM

## 2017-08-22 DIAGNOSIS — N186 End stage renal disease: Secondary | ICD-10-CM

## 2017-08-22 DIAGNOSIS — R109 Unspecified abdominal pain: Secondary | ICD-10-CM

## 2017-08-22 DIAGNOSIS — E1122 Type 2 diabetes mellitus with diabetic chronic kidney disease: Secondary | ICD-10-CM

## 2017-08-22 DIAGNOSIS — E11319 Type 2 diabetes mellitus with unspecified diabetic retinopathy without macular edema: Secondary | ICD-10-CM

## 2017-08-22 DIAGNOSIS — R110 Nausea: Secondary | ICD-10-CM

## 2017-08-22 DIAGNOSIS — Z87891 Personal history of nicotine dependence: Secondary | ICD-10-CM

## 2017-08-22 DIAGNOSIS — I120 Hypertensive chronic kidney disease with stage 5 chronic kidney disease or end stage renal disease: Secondary | ICD-10-CM

## 2017-08-22 DIAGNOSIS — D631 Anemia in chronic kidney disease: Secondary | ICD-10-CM | POA: Insufficient documentation

## 2017-08-22 DIAGNOSIS — R197 Diarrhea, unspecified: Secondary | ICD-10-CM

## 2017-08-22 DIAGNOSIS — A084 Viral intestinal infection, unspecified: Principal | ICD-10-CM | POA: Insufficient documentation

## 2017-08-22 DIAGNOSIS — E860 Dehydration: Secondary | ICD-10-CM | POA: Insufficient documentation

## 2017-08-22 DIAGNOSIS — R12 Heartburn: Secondary | ICD-10-CM

## 2017-08-22 DIAGNOSIS — E785 Hyperlipidemia, unspecified: Secondary | ICD-10-CM

## 2017-08-22 DIAGNOSIS — R61 Generalized hyperhidrosis: Secondary | ICD-10-CM

## 2017-08-22 DIAGNOSIS — Z833 Family history of diabetes mellitus: Secondary | ICD-10-CM

## 2017-08-22 DIAGNOSIS — R6883 Chills (without fever): Secondary | ICD-10-CM

## 2017-08-22 HISTORY — DX: Nausea: R11.0

## 2017-08-22 HISTORY — DX: Diarrhea, unspecified: R19.7

## 2017-08-22 LAB — COMPREHENSIVE METABOLIC PANEL
ALT: 21 U/L (ref 17–63)
AST: 16 U/L (ref 15–41)
Albumin: 4.1 g/dL (ref 3.5–5.0)
Alkaline Phosphatase: 76 U/L (ref 38–126)
Anion gap: 13 (ref 5–15)
BUN: 59 mg/dL — ABNORMAL HIGH (ref 6–20)
CHLORIDE: 101 mmol/L (ref 101–111)
CO2: 20 mmol/L — AB (ref 22–32)
CREATININE: 7.62 mg/dL — AB (ref 0.61–1.24)
Calcium: 9.1 mg/dL (ref 8.9–10.3)
GFR, EST AFRICAN AMERICAN: 8 mL/min — AB (ref >60)
GFR, EST NON AFRICAN AMERICAN: 7 mL/min — AB (ref >60)
Glucose, Bld: 274 mg/dL — ABNORMAL HIGH (ref 65–99)
Potassium: 4.5 mmol/L (ref 3.5–5.1)
SODIUM: 134 mmol/L — AB (ref 135–145)
Total Bilirubin: 0.7 mg/dL (ref 0.3–1.2)
Total Protein: 7.5 g/dL (ref 6.5–8.1)

## 2017-08-22 LAB — CBC WITH DIFFERENTIAL/PLATELET
Basophils Absolute: 0 10*3/uL (ref 0.0–0.1)
Basophils Relative: 0 %
Eosinophils Absolute: 0.2 10*3/uL (ref 0.0–0.7)
Eosinophils Relative: 2 %
HEMATOCRIT: 38.3 % — AB (ref 39.0–52.0)
HEMOGLOBIN: 13.4 g/dL (ref 13.0–17.0)
LYMPHS ABS: 0.5 10*3/uL — AB (ref 0.7–4.0)
Lymphocytes Relative: 5 %
MCH: 31.2 pg (ref 26.0–34.0)
MCHC: 35 g/dL (ref 30.0–36.0)
MCV: 89.3 fL (ref 78.0–100.0)
MONOS PCT: 4 %
Monocytes Absolute: 0.3 10*3/uL (ref 0.1–1.0)
NEUTROS ABS: 8.1 10*3/uL — AB (ref 1.7–7.7)
NEUTROS PCT: 89 %
Platelets: 137 10*3/uL — ABNORMAL LOW (ref 150–400)
RBC: 4.29 MIL/uL (ref 4.22–5.81)
RDW: 14.9 % (ref 11.5–15.5)
WBC: 9.1 10*3/uL (ref 4.0–10.5)

## 2017-08-22 LAB — C DIFFICILE QUICK SCREEN W PCR REFLEX
C DIFFICILE (CDIFF) INTERP: NOT DETECTED
C DIFFICILE (CDIFF) TOXIN: NEGATIVE
C Diff antigen: NEGATIVE

## 2017-08-22 LAB — GLUCOSE, CAPILLARY
GLUCOSE-CAPILLARY: 229 mg/dL — AB (ref 65–99)
Glucose-Capillary: 130 mg/dL — ABNORMAL HIGH (ref 65–99)

## 2017-08-22 MED ORDER — LOPERAMIDE HCL 2 MG PO CAPS
2.0000 mg | ORAL_CAPSULE | ORAL | Status: DC | PRN
  Administered 2017-08-22: 2 mg via ORAL
  Filled 2017-08-22: qty 1

## 2017-08-22 MED ORDER — ATORVASTATIN CALCIUM 20 MG PO TABS
20.0000 mg | ORAL_TABLET | Freq: Every day | ORAL | Status: DC
  Administered 2017-08-23: 20 mg via ORAL
  Filled 2017-08-22: qty 1

## 2017-08-22 MED ORDER — INSULIN ASPART 100 UNIT/ML ~~LOC~~ SOLN
0.0000 [IU] | Freq: Three times a day (TID) | SUBCUTANEOUS | Status: DC
  Administered 2017-08-22: 3 [IU] via SUBCUTANEOUS
  Administered 2017-08-23: 2 [IU] via SUBCUTANEOUS

## 2017-08-22 MED ORDER — SODIUM CHLORIDE 0.9 % IV SOLN
INTRAVENOUS | Status: DC
  Administered 2017-08-22: 13:00:00 via INTRAVENOUS

## 2017-08-22 MED ORDER — ACETAMINOPHEN 325 MG PO TABS
650.0000 mg | ORAL_TABLET | Freq: Four times a day (QID) | ORAL | Status: DC | PRN
  Administered 2017-08-22 – 2017-08-23 (×3): 650 mg via ORAL
  Filled 2017-08-22 (×3): qty 2

## 2017-08-22 MED ORDER — INSULIN ASPART 100 UNIT/ML ~~LOC~~ SOLN
0.0000 [IU] | Freq: Every day | SUBCUTANEOUS | Status: DC
Start: 2017-08-22 — End: 2017-08-23

## 2017-08-22 MED ORDER — SODIUM BICARBONATE 650 MG PO TABS
650.0000 mg | ORAL_TABLET | Freq: Three times a day (TID) | ORAL | Status: DC
  Administered 2017-08-22 – 2017-08-23 (×3): 650 mg via ORAL
  Filled 2017-08-22 (×3): qty 1

## 2017-08-22 MED ORDER — AMLODIPINE BESYLATE 10 MG PO TABS
10.0000 mg | ORAL_TABLET | Freq: Every day | ORAL | Status: DC
  Administered 2017-08-23: 10 mg via ORAL
  Filled 2017-08-22: qty 1

## 2017-08-22 MED ORDER — PROMETHAZINE HCL 25 MG/ML IJ SOLN: 12.5000 mg | Freq: Four times a day (QID) | INTRAMUSCULAR | Status: DC | PRN

## 2017-08-22 MED ORDER — HEPARIN SODIUM (PORCINE) 5000 UNIT/ML IJ SOLN: 5000.0000 [IU] | Freq: Three times a day (TID) | INTRAMUSCULAR | Status: DC

## 2017-08-22 MED ORDER — CARVEDILOL 12.5 MG PO TABS
12.5000 mg | ORAL_TABLET | Freq: Two times a day (BID) | ORAL | Status: DC
  Administered 2017-08-23: 12.5 mg via ORAL
  Filled 2017-08-22 (×2): qty 1

## 2017-08-22 MED ORDER — INSULIN GLARGINE 100 UNIT/ML ~~LOC~~ SOLN
14.0000 [IU] | Freq: Every day | SUBCUTANEOUS | Status: DC
  Administered 2017-08-22: 14 [IU] via SUBCUTANEOUS
  Filled 2017-08-22 (×2): qty 0.14

## 2017-08-22 NOTE — Assessment & Plan Note (Signed)
Patient presents with complaints of nonbloody, watery diarrhea that started this morning around 2 AM.  He has been having bowel movements every 10 minutes since symptoms started.  He notices nausea and dry heaving, but no emesis.  He also reports subjective fevers for which he took 2 tablets of Advil at home.  He has been unable to take his AM medications or tolerate any PO intake since onset of symptoms.  Denies recent antibiotic use or changes in diet.  He has 2 family members with similar symptoms for the past 2 days. He is afebrile and hemodynamically stable at this time. On, exam he appears uncomfortable and is mildly diaphoretic. His abdomen is soft, non-tender, and non-distended. He has hyperactive bowel sounds. During our encounter he had several bowel movements and was heard dry heaving in the restroom. Suspect this is viral gastroenteritis given history of sick contacts at home. Will admit given inability to tolerate PO intake and take PO medications. Will need gentle IVF hydration given ESRD on HD T/TH/S.   - Admission for IVF - Imodium for diarrhea  - BMP to check electrolytes   - Advised to stop taking Advil at home and recommended Tylenol for management of fever in the future

## 2017-08-22 NOTE — H&P (Signed)
Date: 08/22/2017               Patient Name:  Eric Glenn MRN: [id number]  DOB: 01-19-1966 Age / Sex: 52 y.o., male   PCP: Welford Roche, MD              Medical Service: Internal Medicine Teaching Service              Attending Physician: Dr. Oval Linsey, MD    First Contact: Suszanne Conners, [place] 3 Pager: [phone]  Second Contact: Dr. Neva Seat Pager: Consult H&P by Dr. Trilby Drummer  Third Contact Dr. Lorella Nimrod Pager: Consult H&P by Dr. Trilby Drummer       After Hours (After 5p/  First Contact Pager: [phone]  weekends / holidays): Second Contact Pager: [phone]   Chief Complaint: Diarrhea  History of Present Illness:  Eric Glenn is a 52 y.o man w/ a PMHx of ESRD on HD (T/TH/S), T2DM, diabetic retinopathy, anemia, hyperlipidemia, and HTN who presents with a 12 hour history of diarrhea, nausea, fever, and chills. He states that he has been having diarrhea every 10 minutes and it is watery in consistency, without any blood or pain with defecation. He has been nauseous and dry heaving, but no emesis. He has not tried to take anything PO except a sip of Sprite recently that made him nauseas, but not vomit. He has been shivering from chills but feels hot. He has been having some rhinorrhea around this time, but no cough, SOB, sinus congestion, or chest pain. He reports subjective fever for which he took 2 Advil at home, but has not managed his symptoms with other medications. He states that his sister and niece have both recently had similar GI symptoms. Denies recent antibiotic use, changes in diet, or travel. He had a similar presentation in 02/2016 which resulted in hypoglycemia and non-anion gap metabolic acidosis, with the diarrhea resolved during the admission.   Meds: Current Facility-Administered Medications  Medication Dose Route Frequency Provider Last Rate Last Dose  . 0.9 %  sodium chloride infusion   Intravenous Continuous Lorella Nimrod, MD 100 mL/hr at  08/22/17 1322    . heparin injection 5,000 Units  5,000 Units Subcutaneous Q8H Lorella Nimrod, MD        Allergies: Allergies as of 08/22/2017 - Review Complete 08/22/2017  Allergen Reaction Noted  . Poractant alfa Rash and Other (See Comments) 01/07/2016  . Pork-derived products Rash and Other (See Comments) 08/03/2015   Past Medical History:  Diagnosis Date  . Anemia   . Diabetes mellitus type 2, controlled ([place]) 08/04/2015  . Diabetic retinopathy ([place])   . ESRD (end stage renal disease) ([place]) 03/10/2016  . High cholesterol   . Hypertension    Past Surgical History:  Procedure Laterality Date  . AV FISTULA PLACEMENT Right 09/04/2015   Procedure: ARTERIOVENOUS (AV) FISTULA CREATION;  Surgeon: Angelia Mould, MD;  Location: [place];  Service: Vascular;  Laterality: Right;  . AV FISTULA PLACEMENT Right 01/22/2016   Procedure: RIGHT UPPER ARM BRACHIOCEPHALIC ARTERIOVENOUS (AV) FISTULA CREATION;  Surgeon: Angelia Mould, MD;  Location: [place];  Service: Vascular;  Laterality: Right;  . LIGATION OF ARTERIOVENOUS  FISTULA Right 01/22/2016   Procedure: LIGATION OF RIGHT FOREARM ARTERIOVENOUS  FISTULA;  Surgeon: Angelia Mould, MD;  Location: [place];  Service: Vascular;  Laterality: Right;  . PERIPHERAL VASCULAR CATHETERIZATION N/A 01/12/2016   Procedure: Fistulagram;  Surgeon: Angelia Mould, MD;  Location: [place] [facility];  Service:  Cardiovascular;  Laterality: N/A;  . UMBILICAL HERNIA REPAIR     Family History  Problem Relation Age of Onset  . Hypertension Mother   . Hyperlipidemia Mother   . Diabetes Mellitus II Sister    Social History   Socioeconomic History  . Marital status: Single    Spouse name: Not on file  . Number of children: Not on file  . Years of education: Not on file  . Highest education level: Not on file  Social Needs  . Financial resource strain: Not on file  . Food insecurity - worry: Not on file  . Food insecurity - inability: Not on  file  . Transportation needs - medical: Not on file  . Transportation needs - non-medical: Not on file  Occupational History  . Not on file  Tobacco Use  . Smoking status: Former Smoker    Last attempt to quit: 10/22/2003    Years since quitting: 13.8  . Smokeless tobacco: Never Used  Substance and Sexual Activity  . Alcohol use: No    Alcohol/week: 0.0 oz  . Drug use: No  . Sexual activity: Not on file  Other Topics Concern  . Not on file  Social History Narrative  . Not on file    Review of Systems: Constitutional: positive for chills, fevers, malaise and night sweats Eyes: positive for redness Respiratory: negative Cardiovascular: negative Gastrointestinal: positive for diarrhea, nausea and negative for pain  Physical Exam: Blood pressure (!) 157/79, pulse 75, temperature 99.3 F (37.4 C), temperature source Oral, height 5\' 3"  (1.6 m), weight 87.9 kg (193 lb 12.6 oz).  General: uncomfortable appearing, shivering HEENT: NCAT, dry MM, conjunctival injection CV: RRR, nl S1S2, no m/r/g Pulm: CTAB, no increased work of breathing, no wheezes, crackles, or rhonchi  Abd: soft, nontender to palpation across all quadrants, hyperactive bowel sounds, no CVA tenderness Ext: Warm, well perfused, no peripheral edema, normal capillary refill, mild skin tenting  Lab results: BMET    Component Value Date/Time   NA 134 (L) 08/22/2017 1338   NA 139 03/29/2016 1602   K 4.5 08/22/2017 1338   CL 101 08/22/2017 1338   CO2 20 (L) 08/22/2017 1338   GLUCOSE 274 (H) 08/22/2017 1338   BUN 59 (H) 08/22/2017 1338   BUN 45 (H) 03/29/2016 1602   CREATININE 7.62 (H) 08/22/2017 1338   CREATININE 3.41 (H) 10/01/2015 1108   CALCIUM 9.1 08/22/2017 1338   GFRNONAA 7 (L) 08/22/2017 1338   GFRNONAA 20 (L) 10/01/2015 1108   GFRAA 8 (L) 08/22/2017 1338   GFRAA 23 (L) 10/01/2015 1108   CBC    Component Value Date/Time   WBC 9.1 08/22/2017 1338   RBC 4.29 08/22/2017 1338   HGB 13.4 08/22/2017  1338   HCT 38.3 (L) 08/22/2017 1338   HCT 25.5 (L) 03/23/2016 1556   PLT 137 (L) 08/22/2017 1338   MCV 89.3 08/22/2017 1338   MCH 31.2 08/22/2017 1338   MCHC 35.0 08/22/2017 1338   RDW 14.9 08/22/2017 1338   LYMPHSABS 0.5 (L) 08/22/2017 1338   MONOABS 0.3 08/22/2017 1338   EOSABS 0.2 08/22/2017 1338   BASOSABS 0.0 08/22/2017 1338    Imaging results:  No results found.  Assessment & Plan by Problem: Active Problems:   Diarrhea  Mr. Hudler is a 52 y.o man w/ a PMHx of ESRD on HD (T/TH/S), T2DM, diabetic retinopathy, anemia, hyperlipidemia, and HTN who presents with a 12 hour history of diarrhea, nausea, fever, and chills concerning for  viral gastroenteritis.   Diarrhea: Watery diarrhea, nausea, chills, fever, and exposure to sick contacts suggest viral gastroenteritis. - Gentle IVF hydration due to ESRD on HD, encourage increased PO intake - Promethazine 12.5 mg injection q6 hrs prn for nausea - Acetaminophen 650 mg PO q6 hrs prn for fever, pain, and discomfort - C diff ordered by nursing, pending  Non-anion gap metabolic acidosis: Na- 993, Cl- 101, CO2- 20, anion gap- 13. Likely due to diarrhea. - Loperamide 2 mg PO prn to manage diarrhea - Continue to monitor BMP   ESRD on HD: Receives HD on T/TH/S. - Continue to monitor BMP - Gentle IVF hydration with close monitoring of kidney function - Consult nephrology concerning hemodialysis scheduled for tomorrow.   T2DM: Glucose on admission- 274.  - Lantus 14 units plus Novolog SSI - CTM glucose   Chronic HTN: BP 157/79 on admission.  - Continue home amlodipine 10 mg PO qd and carvedilol 12.5 mg PO bid  Ppx: SCDs due to pork allergy preventing use of anticoagulants Diet: Soft, advance as tolerated Dispo: Observation status with expected stay less than 2 days   This is a Careers information officer Note.  The care of the patient was discussed with Dr. Trilby Drummer and the assessment and plan was formulated with their assistance.   Please see their note for official documentation of the patient encounter.   Signed: Isidore Moos, Medical Student 08/22/2017, 1:26 PM

## 2017-08-22 NOTE — Progress Notes (Signed)
  [facility] Brief Note    Waymond Meador is a 52 y.o. Male with PMH ESRD on HD [facility] MWF), DM Type II, HTN, diabetic retinopathy, neuropathy.   Admitted under observation status for evaluation of diarrhea. Profuse watery diarrhea starting this morning with chills. Had family members with similar symptoms. Temp slightly elevated 72F. VS otherwise stable. C. Diff negative.  Seen at bedside with family present. Reports that diarrhea has improved since this am. Denies abdominal pain, nausea, vomiting. He's hungry and asking for something to eat.   Has been compliant with dialysis treatments. Last treatment Saturday 2/2. Labs stable, no indication for urgent dialysis today. Will be due for routine HD tomorrow.     Dialysis Orders: [facility] TTS 4h 180F 400/800 EDW 87.5kg 2K/2.25Ca UF Profile 4 R AVF Hep 4000 Venofer 50mg  IV q wk Mircera 1105mcg IV q 2 weeks (last 1/24) Calcitriol 1 mcg PO TIW   -Orders written for HD in the am if still here.   Lynnda Child PA-C [facility] Pager [phone] 08/22/2017,5:10 PM  Pt seen, examined and agree w A/P as above.  Kelly Splinter MD [employer] pager [phone]   08/23/2017, 9:49 AM

## 2017-08-22 NOTE — Patient Instructions (Signed)
Patient admitted to the hospital. No AVS provided.

## 2017-08-22 NOTE — Progress Notes (Signed)
   CC: Diarrhea   HPI:  [name] is a 52 y.o. male with PMH listed below who presents to clinic with 1-day history of diarrhea. Please see problem based assessment and plan for further details.   Past Medical History:  Diagnosis Date  . Anemia   . Diabetes mellitus type 2, controlled ([place]) 08/04/2015  . Diabetic retinopathy ([place])   . ESRD (end stage renal disease) ([place]) 03/10/2016  . High cholesterol   . Hypertension    Review of Systems:   Review of Systems  Constitutional: Positive for chills and diaphoresis. Negative for fever.  Respiratory: Negative for cough and shortness of breath.   Cardiovascular: Negative for chest pain, palpitations and leg swelling.  Gastrointestinal: Positive for abdominal pain, diarrhea, heartburn and nausea. Negative for blood in stool, melena and vomiting.  Musculoskeletal: Negative for myalgias.  Neurological: Negative for dizziness and headaches.    Physical Exam:  Vitals:   08/22/17 1113  BP: 133/73  Pulse: 82  Temp: 98.2 F (36.8 C)  TempSrc: Oral  Weight: 193 lb 12.8 oz (87.9 kg)  Height: 5\' 3"  (1.6 m)   General: very pleasant male, well-nourished, well-developed, in no acute distress   HENT: NCAT, neck supple and FROM, dry MM, OP clear without exudates or erythema   Cardiac: regular rate and rhythm, nl S1/S2, no murmurs, rubs or gallops  Pulm: CTAB, no wheezes or crackles, no increased work of breathing  Abd: soft, NTND, hyperactive bowel sounds  Ext: warm and well perfused, no peripheral edema noted    Assessment & Plan:   See Encounters Tab for problem based charting.  Patient discussed with Dr. Lynnae January

## 2017-08-22 NOTE — Progress Notes (Signed)
Internal Medicine Clinic Attending  Case discussed with Dr. Santos at the time of the visit.  We reviewed the resident's history and exam and pertinent patient test results.  I agree with the assessment, diagnosis, and plan of care documented in the resident's note.    

## 2017-08-22 NOTE — H&P (Signed)
Date: 08/22/2017               Patient Name:  Eric Glenn MRN: [id number]  DOB: 07-Jun-1966 Age / Sex: 52 y.o., male   PCP: Welford Roche, MD         Medical Service: Internal Medicine Teaching Service         Attending Physician: Dr. Oval Linsey, MD    First Contact: Dr. Trilby Drummer Pager: [phone]  Second Contact: Dr. Reesa Chew Pager: [phone]       After Hours (After 5p/  First Contact Pager: [phone]  weekends / holidays): Second Contact Pager: [phone]   Chief Complaint: Diarrhea  History of Present Illness: 52 yo M with Hx of ESRD (on HD T,Th,S), DM, HTN, and Asthma presents from the internal medicine clinic with severe diarrhea. The patient has been experiencing nonbloody, watery diarrhea since 2 am this morning. He states he has been having bowel movements about every 10 minutes since symptoms started. He enodoses nausea and dry heaving, but no vomitting. He also endorses subjective fevers earlier today and chills. He took 2 Advil at home. He has been unable to take anything else by mouth this morning, including his AM medications. He denies recent antibiotic use, changes in diet, or recent travel. He states 2 of his family members have been experiencing similar symptoms over the past 2 days.  In the clinic, he was afebrile and hemodynamically stable. He had several bowel movements during his clinic visit and was also heard dry heaving in the restroom. Patient admitted from clinic for further workup and care.   Meds:  No outpatient medications have been marked as taking for the 08/22/17 encounter [facility] Encounter).    Allergies: Allergies as of 08/22/2017 - Review Complete 08/22/2017  Allergen Reaction Noted  . Poractant alfa Rash and Other (See Comments) 01/07/2016  . Pork-derived products Rash and Other (See Comments) 08/03/2015   Past Medical History:  Diagnosis Date  . Anemia   . Diabetes mellitus type 2, controlled ([place]) 08/04/2015  . Diabetic  retinopathy ([place])   . Diarrhea   . ESRD (end stage renal disease) ([place]) 03/10/2016  . High cholesterol   . Hypertension   . Nausea 08/2017    Family History: Family History  Problem Relation Age of Onset  . Hypertension Mother   . Hyperlipidemia Mother   . Diabetes Mellitus II Sister   Reviewed on admission  Social History: Social History   Tobacco Use  . Smoking status: Former Smoker    Last attempt to quit: 10/22/2003    Years since quitting: 13.8  . Smokeless tobacco: Never Used  Substance Use Topics  . Alcohol use: No    Alcohol/week: 0.0 oz  . Drug use: No   Review of Systems: A complete ROS was negative except as per HPI.  Physical Exam: Blood pressure 135/75, pulse 80, temperature 99 F (37.2 C), temperature source Oral, resp. rate 18, height 5\' 3"  (1.6 m), weight 193 lb 12.6 oz (87.9 kg), SpO2 100 %. Physical Exam  Constitutional: He is oriented to person, place, and time. He appears well-developed and well-nourished.  HENT:  Head: Normocephalic and atraumatic.  Eyes: EOM are normal. Right eye exhibits no discharge. Left eye exhibits no discharge.  Cardiovascular: Normal rate, regular rhythm, normal heart sounds and intact distal pulses.  Pulmonary/Chest: Effort normal and breath sounds normal. No respiratory distress.  Abdominal: Soft. He exhibits no distension. There is no tenderness.  Hyperactive Bowl Sounds  Musculoskeletal:  He exhibits no deformity.  Trace RLE Edema  Neurological: He is alert and oriented to person, place, and time.  Skin: Skin is warm and dry.    Assessment & Plan by Problem:  52 yo M with Hx of ESRD (on HD T,Th,S), DM, HTN, and Asthma presents from the internal medicine clinic with severe diarrhea. One day of watery, non-bloody diarrhea. Associated nausea with dry-heaving. Recent sick contacts with similar symptoms.  Diarrhea: Likely viral etiology considering sick contacts, lack of change in diet, and associated nausea and dry  heaving. C. Diff negative. - Gentle IVF (in the setting ESRD on HD) - Phenergan 12.5 mg q6h, PRN nausea - Tylenol q6h, PRN fever/pain - Imodium, PRN - BMP - CBC  ESRD on HD T/Th/S - Consult Nephrology as patient will likely need HD inpatient tomorrow - Nephrology to dose EPO  DM: On 28 Units levemir, and Sitagliptan at home - 14U Levemir qhs - SSI-M w/ qhs coverage - CBG  Hypertension:  Normo to hypertensive so far this admission. Om hyralazine 100mg  TID, Amlodipine 10mg  Daily, and Carvedilol 12.5mg  BID at home - Continue Amlodipine 10mg  Daily, and Carvedilol 12.5mg  BID - Hold home hydralazine  Hyperlipidemia: - Continue home Atorvastatin  FEN: Soft Diet, NS @ 175mL/hr DVT Prophylaxis: SCDs (Pork allergy) Code Status: Full  Dispo: Admit patient to Observation with expected length of stay less than 2 midnights.  Signed: Neva Seat, MD 08/22/2017, 2:22 PM  Pager: [phone]

## 2017-08-23 DIAGNOSIS — J45909 Unspecified asthma, uncomplicated: Secondary | ICD-10-CM

## 2017-08-23 LAB — RENAL FUNCTION PANEL
ALBUMIN: 3.6 g/dL (ref 3.5–5.0)
Anion gap: 17 — ABNORMAL HIGH (ref 5–15)
BUN: 67 mg/dL — AB (ref 6–20)
CALCIUM: 8.8 mg/dL — AB (ref 8.9–10.3)
CHLORIDE: 100 mmol/L — AB (ref 101–111)
CO2: 18 mmol/L — ABNORMAL LOW (ref 22–32)
CREATININE: 8.16 mg/dL — AB (ref 0.61–1.24)
GFR, EST AFRICAN AMERICAN: 8 mL/min — AB (ref >60)
GFR, EST NON AFRICAN AMERICAN: 7 mL/min — AB (ref >60)
Glucose, Bld: 197 mg/dL — ABNORMAL HIGH (ref 65–99)
PHOSPHORUS: 3.2 mg/dL (ref 2.5–4.6)
Potassium: 4 mmol/L (ref 3.5–5.1)
Sodium: 135 mmol/L (ref 135–145)

## 2017-08-23 LAB — CBC
HCT: 33.9 % — ABNORMAL LOW (ref 39.0–52.0)
Hemoglobin: 11.5 g/dL — ABNORMAL LOW (ref 13.0–17.0)
MCH: 30.4 pg (ref 26.0–34.0)
MCHC: 33.9 g/dL (ref 30.0–36.0)
MCV: 89.7 fL (ref 78.0–100.0)
PLATELETS: 120 10*3/uL — AB (ref 150–400)
RBC: 3.78 MIL/uL — AB (ref 4.22–5.81)
RDW: 15 % (ref 11.5–15.5)
WBC: 7.4 10*3/uL (ref 4.0–10.5)

## 2017-08-23 LAB — GLUCOSE, CAPILLARY
GLUCOSE-CAPILLARY: 153 mg/dL — AB (ref 65–99)
GLUCOSE-CAPILLARY: 173 mg/dL — AB (ref 65–99)

## 2017-08-23 LAB — HIV ANTIBODY (ROUTINE TESTING W REFLEX): HIV Screen 4th Generation wRfx: NONREACTIVE

## 2017-08-23 MED ORDER — SODIUM BICARBONATE 650 MG PO TABS: 650.0000 mg | ORAL_TABLET | Freq: Three times a day (TID) | ORAL | 0 refills | Status: DC

## 2017-08-23 NOTE — Procedures (Signed)
Fevers are better, diarrhea is better.  Stable on HD, at dry wt, minimal UF.    I was present at this dialysis session, have reviewed the session itself and made  appropriate changes Kelly Splinter MD [place] pager [phone]   08/23/2017, 9:49 AM

## 2017-08-23 NOTE — Progress Notes (Signed)
   Subjective: Patient seen resting this morning in dialysis and again in his room when family was present to translate. He states he is feeling better. He states that he has not had any diarrhea since yesterday and has only felt mild nausea without any vomiting. He endorse some pleuritic chest pain after dialysis, but otherwise has no complaints. No abdominal pain or shortness of breath.  Objective:  Vital signs in last 24 hours: Vitals:   08/23/17 1000 08/23/17 1030 08/23/17 1100 08/23/17 1130  BP: 138/75 (!) 144/80 133/77 (!) 147/74  Pulse: 75 73 72 77  Resp: 18 18 18 18   Temp:    99.5 F (37.5 C)  TempSrc:    Oral  SpO2:      Weight:    190 lb 11.2 oz (86.5 kg)  Height:       Physical Exam  Constitutional: He is oriented to person, place, and time. He appears well-developed and well-nourished.  HENT:  Head: Normocephalic and atraumatic.  Eyes: EOM are normal. Right eye exhibits no discharge. Left eye exhibits no discharge.  Cardiovascular: Normal rate, regular rhythm, normal heart sounds and intact distal pulses.  Pulmonary/Chest: Effort normal and breath sounds normal. No respiratory distress.  Abdominal: Soft. Bowel sounds are normal. He exhibits no distension.  Musculoskeletal: He exhibits no edema or deformity.  RUE Fistula with palpable thrill  Neurological: He is alert and oriented to person, place, and time.  Skin: Skin is warm and dry.   Assessment/Plan: Assessment & Plan by Problem: 52 yo M with Hx of ESRD (on HD T,Th,S), DM, HTN, and Asthma presents from the internal medicine clinic with severe diarrhea. One day of watery, non-bloody diarrhea. Associated nausea with dry-heaving. Recent sick contacts with similar symptoms.  Diarrhea: Resolving. Likely viral etiology considering sick contacts, lack of change in diet, and associated nausea and dry heaving. C. Diff negative. Patient requesting diet. CBC without significant abnormality. Renal Function panel shows mild  metabolic acidosis in the setting of renal failure (labs taken prior to HD). - Soft Diet - Phenergan 12.5 mg q6h, PRN nausea - Tylenol q6h, PRN fever/pain - Imodium, PRN  Hypertension:  Normo to hypertensive so far this admission. On hyralazine 100mg  TID, Amlodipine 10mg  Daily, and Carvedilol 12.5mg  BID at home - Continue Amlodipine 10mg  Daily, and Carvedilol 12.5mg  BID - Hold home hydralazine  ESRD on HD T/Th/S: Tolerated dialysis well today. Does have some pleuritic chest pain after dialysis.  DM: On 28 Units levemir, and Sitagliptan at home - 14U Levemir qhs - SSI-M w/ qhs coverage - CBG  Hyperlipidemia: - Continue home Atorvastatin  FEN: Soft Diet DVT Prophylaxis: SCDs (Pork allergy) Code Status: Full  Dispo: Anticipated discharge later today, provided patient is able to tolerate PO intake.   Neva Seat, MD 08/23/2017, 2:55 PM Pager: [phone]

## 2017-08-23 NOTE — Discharge Summary (Signed)
Name: Eric Glenn MRN: [id number] DOB: 23-Jul-1965 52 y.o. PCP: Welford Roche, MD  Date of Admission: 08/22/2017 12:29 PM Date of Discharge: 08/23/2017 Attending Physician: Oval Linsey, MD  Discharge Diagnosis:  Active Problems:   Diarrhea  Discharge Medications: Allergies as of 08/23/2017      Reactions   Poractant Alfa Rash, Other (See Comments)   Raises blood pressure   Pork-derived Products Rash, Other (See Comments)   Raises blood pressure      Medication List    TAKE these medications   albuterol 108 (90 Base) MCG/ACT inhaler Commonly known as:  PROVENTIL HFA;VENTOLIN HFA Inhale 2 puffs into the lungs every 6 (six) hours as needed for wheezing or shortness of breath.   amLODipine 10 MG tablet Commonly known as:  NORVASC Take 1 tablet (10 mg total) by mouth daily.   atorvastatin 20 MG tablet Commonly known as:  LIPITOR Take 1 tablet (20 mg total) by mouth daily.   Brinzolamide-Brimonidine 1-0.2 % Susp Place 1 drop into the left eye daily.   carvedilol 12.5 MG tablet Commonly known as:  COREG Take 1 tablet (12.5 mg total) by mouth 2 (two) times daily.   diphenhydrAMINE 25 MG tablet Commonly known as:  BENADRYL Take 0.5 tablets (12.5 mg total) by mouth 3 (three) times a week.   epoetin alfa 10000 UNIT/ML injection Commonly known as:  EPOGEN,PROCRIT Inject 0.5 mLs (5,000 Units total) into the skin every Wednesday.   Fish Oil 1200 MG Caps Take 1,200 mg by mouth 2 (two) times daily.   fluticasone 50 MCG/ACT nasal spray Commonly known as:  FLONASE Place 1 spray into both nostrils daily. What changed:    when to take this  reasons to take this   glucose blood test strip Commonly known as:  TRUE METRIX BLOOD GLUCOSE TEST IM program/HOPE [facility], Use as instructed 4 times a day.   hydrALAZINE 100 MG tablet Commonly known as:  APRESOLINE Take 1 tablet (100 mg total) by mouth 3 (three) times daily.   Insulin Detemir 100 UNIT/ML  Pen Commonly known as:  LEVEMIR Inject 28 Units into the skin daily. IM program, d/c basaglar   Lancets Misc 4 Times a day. IM Program/[facility].   multivitamin Tabs tablet Take 1 tablet by mouth daily.   ofloxacin 0.3 % ophthalmic solution Commonly known as:  OCUFLOX Place 1 drop into the left eye daily.   pravastatin 40 MG tablet Commonly known as:  PRAVACHOL Take 40 mg by mouth every evening.   sitaGLIPtin 25 MG tablet Commonly known as:  JANUVIA Take 1 tablet (25 mg total) by mouth daily.   sodium bicarbonate 650 MG tablet Take 1 tablet (650 mg total) by mouth 3 (three) times daily with meals.   triamcinolone cream 0.1 % Commonly known as:  KENALOG APPLY TO THE AFFECTED AREA TWICE DAILY   TRUE METRIX METER Devi 1 each by Does not apply route 4 (four) times daily. IM Program/HOPE [facility]       Disposition and follow-up:   Eric Glenn was discharged from [facility] in Stable condition.  At the hospital follow up visit please address:  1.  Diarrhea/Dehydration 2/2 Viral Gastroenteritis: Please monitor for recurrence of symptoms.   2.  Labs / imaging needed at time of follow-up: None  3.  Pending labs/ test needing follow-up: None  Follow-up Appointments: Follow-up Information    Welford Roche, MD. Schedule an appointment as soon as possible for a visit.   Specialty:  Internal Medicine Contact information: [place] [address] [place] Hospital Course by problem list:  1.  Diarrhea/Dehydration 2/2 Viral Gastroenteritis: 52 yo M with Hx of ESRD (on HD T,Th,S), DM, HTN, and Asthma presented from the internal medicine clinic with oneday of watery, non-bloody diarrhea and associated nausea with dry-heaving. He had been having bowl movents every 10 minutes or so since that morning. He reported recent sick contacts with similar symptoms. He denied recent antibiotic use. He received a C. Diff PCR  test (due to protocol) which was negative. His symptoms resolved with supportive care and IVF overnight. He received his Thursday dialysis inpatient. He was started on a diet, which he tolerated well. He was discharged in stable condition.  Discharge Vitals:   BP (!) 147/74 (BP Location: Left Arm)   Pulse 77   Temp 99.5 F (37.5 C) (Oral)   Resp 18   Ht 5\' 3"  (1.6 m)   Wt 190 lb 11.2 oz (86.5 kg)   SpO2 95%   BMI 33.78 kg/m   Pertinent Labs, Studies, and Procedures:  CBC Latest Ref Rng & Units 08/23/2017 08/22/2017 03/24/2016  WBC 4.0 - 10.5 K/uL 7.4 9.1 5.9  Hemoglobin 13.0 - 17.0 g/dL 11.5(L) 13.4 8.1(L)  Hematocrit 39.0 - 52.0 % 33.9(L) 38.3(L) 25.1(L)  Platelets 150 - 400 K/uL 120(L) 137(L) 140(L)   BMP Latest Ref Rng & Units 08/23/2017 08/22/2017 03/29/2016  Glucose 65 - 99 mg/dL 197(H) 274(H) 210(H)  BUN 6 - 20 mg/dL 67(H) 59(H) 45(H)  Creatinine 0.61 - 1.24 mg/dL 8.16(H) 7.62(H) 5.13(H)  BUN/Creat Ratio 9 - 20 - - 9  Sodium 135 - 145 mmol/L 135 134(L) 139  Potassium 3.5 - 5.1 mmol/L 4.0 4.5 4.9  Chloride 101 - 111 mmol/L 100(L) 101 103  CO2 22 - 32 mmol/L 18(L) 20(L) 21  Calcium 8.9 - 10.3 mg/dL 8.8(L) 9.1 8.3(L)    Discharge Instructions: Discharge Instructions    Call MD for:  persistant dizziness or light-headedness   Complete by:  As directed    Call MD for:  persistant nausea and vomiting   Complete by:  As directed    Call MD for:  temperature >100.4   Complete by:  As directed    Diet - low sodium heart healthy   Complete by:  As directed    Discharge instructions   Complete by:  As directed    Thank you for allowing [name] to care for you.  Your symptoms were caused by a viral infection of your GI tract - Please follow up with your PCP in the next 1-2 weeks  Contact a medical professional if you experience severe symptoms   Increase activity slowly   Complete by:  As directed       Signed: Neva Seat, MD 08/23/2017, 5:30 PM   Pager: [phone]

## 2017-08-23 NOTE — Progress Notes (Signed)
Subjective:  Eric Glenn states that he is much improved since yesterday and after HD today. He has had no more diarrhea since yesterday, no emesis, and no pain. He has still endorsed some nausea. He has an appetite and will try eating soft diet this afternoon. Endorses some mild pleuritic chest pain upon deep inspiration.   Objective: Vital signs in last 24 hours: Vitals:   08/23/17 1000 08/23/17 1030 08/23/17 1100 08/23/17 1130  BP: 138/75 (!) 144/80 133/77 (!) 147/74  Pulse: 75 73 72 77  Resp: 18 18 18 18   Temp:    99.5 F (37.5 C)  TempSrc:    Oral  SpO2:      Weight:    86.5 kg (190 lb 11.2 oz)  Height:       Weight change:   Intake/Output Summary (Last 24 hours) at 08/23/2017 1348 Last data filed at 08/23/2017 1125 Gross per 24 hour  Intake 600 ml  Output 900 ml  Net -300 ml   General: Sitting in bed comfortably, no acute distress HEENT: NCAT, MMM, conjunctival injection CV: RRR, nl S1S2, no m/r/g Pulm: CTAB, no increased work of breathing, no wheezes, crackles, or rhonchi  Abd: soft, nontender to palpation across all quadrants, hyperactive bowel sounds, no CVA tenderness Ext: Warm, well perfused, no peripheral edema, normal capillary refill    Lab Results:  CBC    Component Value Date/Time   WBC 7.4 08/23/2017 0757   RBC 3.78 (L) 08/23/2017 0757   HGB 11.5 (L) 08/23/2017 0757   HCT 33.9 (L) 08/23/2017 0757   HCT 25.5 (L) 03/23/2016 1556   PLT 120 (L) 08/23/2017 0757   MCV 89.7 08/23/2017 0757   MCH 30.4 08/23/2017 0757   MCHC 33.9 08/23/2017 0757   RDW 15.0 08/23/2017 0757   LYMPHSABS 0.5 (L) 08/22/2017 1338   MONOABS 0.3 08/22/2017 1338   EOSABS 0.2 08/22/2017 1338   BASOSABS 0.0 08/22/2017 1338   Glucose- 153  Micro Results: Recent Results (from the past 240 hour(s))  C difficile quick scan w PCR reflex     Status: None   Collection Time: 08/22/17 12:48 PM  Result Value Ref Range Status   C Diff antigen NEGATIVE NEGATIVE Final   C Diff  toxin NEGATIVE NEGATIVE Final   C Diff interpretation No C. difficile detected.  Final    Comment: Performed at [facility], [place] [address] [place], [place] [address]   Studies/Results: No results found. Medications: I have reviewed the patient's current medications. Scheduled Meds: . amLODipine  10 mg Oral Daily  . atorvastatin  20 mg Oral Daily  . carvedilol  12.5 mg Oral BID WC  . insulin aspart  0-5 Units Subcutaneous QHS  . insulin aspart  0-9 Units Subcutaneous TID WC  . insulin glargine  14 Units Subcutaneous QHS  . sodium bicarbonate  650 mg Oral TID WC   Continuous Infusions: . sodium chloride 10 mL/hr (08/23/17 0617)   PRN Meds:.acetaminophen, loperamide, promethazine Assessment/Plan: Active Problems:   Diarrhea  Eric Glenn is a 52 y.o man w/ a PMHx of ESRD on HD (T/TH/S), T2DM, diabetic retinopathy, anemia, hyperlipidemia, and HTN who presented with diarrhea, nausea, fever, and chills, likely due to viral gastroenteritis.   Diarrhea: Resolved. No diarrhea since yesterday. No abdominal pain. Continues to have nausea with no emesis. Has an appetite today. C diff negative. - Advance to soft diet and assess how he tolerates this - Promethazine 12.5 mg injection q6 hrs prn for nausea -  Acetaminophen 650 mg PO q6 hrs prn for fever, pain, and discomfort - Plan to discharge this afternoon if pt can tolerate soft food and fluids without emesis   ESRD on HD: Received HD today (T/TH/S). Feels improvement in symptoms post-HD. - Gentle IVF hydration with close monitoring of kidney function  T2DM: Glucose on admission- 274. Last Glucose- 153.  - Lantus 14 units plus Novolog SSI - CTM glucose    Chronic HTN: BP 157/79 on admission. BP has been normotensive/slightly hypertensive during this admission. Last BP- 147/74. - Continue home amlodipine 10 mg PO qd and carvedilol 12.5 mg PO bid  Ppx: SCDs due to pork allergy preventing use of anticoagulants Diet:  Soft, advance as tolerated Dispo: Discharge planned for this afternoon   This is a Careers information officer Note.  The care of the patient was discussed with Dr. Trilby Drummer and the assessment and plan formulated with their assistance.  Please see their attached note for official documentation of the daily encounter.   LOS: 1 day   Eric Glenn, Medical Student 08/23/2017, 1:48 PM

## 2017-08-23 NOTE — Plan of Care (Signed)
Reviewed with patient inpt hemodialysis treatment orders and routines.  Patient primarily speaks spanish, but indicated that he did understand.

## 2017-08-23 NOTE — Progress Notes (Signed)
Pt Temp. This Am is 102.7. Dr Azucena Kuba notified.  Will give tylenol PO. CDiff PCR came back negative. Will d/c enteric prec. Per Md.

## 2017-08-31 ENCOUNTER — Encounter: Payer: Self-pay | Admitting: Internal Medicine

## 2017-09-01 MED FILL — PRAVASTATIN NA 40 MG TAB: 40 | 30 days supply | Qty: 30 | Fill #2

## 2017-09-01 MED FILL — LEVEMIR FLEXTOUCH 100 UNITS: 100 | 30 days supply | Qty: 9 | Fill #1

## 2017-09-01 MED FILL — RENA-VITE TABLET: 30 days supply | Qty: 30 | Fill #1

## 2017-09-01 MED FILL — JANUVIA 25 MG TABLET: 25 | 30 days supply | Qty: 30 | Fill #3

## 2017-09-02 ENCOUNTER — Other Ambulatory Visit: Payer: Self-pay

## 2017-09-02 ENCOUNTER — Encounter: Payer: Self-pay | Admitting: Internal Medicine

## 2017-09-02 ENCOUNTER — Ambulatory Visit: Payer: Self-pay | Admitting: Internal Medicine

## 2017-09-02 VITALS — BP 149/84 | HR 63 | Wt 198.2 lb

## 2017-09-02 DIAGNOSIS — R197 Diarrhea, unspecified: Secondary | ICD-10-CM

## 2017-09-02 DIAGNOSIS — Z8719 Personal history of other diseases of the digestive system: Secondary | ICD-10-CM

## 2017-09-02 DIAGNOSIS — E119 Type 2 diabetes mellitus without complications: Secondary | ICD-10-CM

## 2017-09-02 DIAGNOSIS — H9192 Unspecified hearing loss, left ear: Secondary | ICD-10-CM

## 2017-09-02 DIAGNOSIS — Z794 Long term (current) use of insulin: Secondary | ICD-10-CM

## 2017-09-02 DIAGNOSIS — H6123 Impacted cerumen, bilateral: Secondary | ICD-10-CM

## 2017-09-02 DIAGNOSIS — A09 Infectious gastroenteritis and colitis, unspecified: Secondary | ICD-10-CM

## 2017-09-02 NOTE — Patient Instructions (Signed)
Seor [name],   Me alegra que se sienta mucho mejor.   Hoy irrigamos sus oidos. Espero que pueda escuchar mejor despues del procedimiento. Si tiene [employer], llame a la clinica y dejenos saber.   Llamenos si tiene alguna pregunta.

## 2017-09-03 ENCOUNTER — Encounter: Payer: Self-pay | Admitting: Internal Medicine

## 2017-09-03 NOTE — Progress Notes (Signed)
   CC: Diarrhea follow up and hearing loss   HPI:  [name] is a 52 y.o. male with PMH listed below who presents to clinic for diarrhea follow-up and ceru unilateral hearing loss.  Please see problem based assessment and plan for further details.  Past Medical History:  Diagnosis Date  . Anemia   . Diabetes mellitus type 2, controlled ([place]) 08/04/2015  . Diabetic retinopathy ([place])   . Diarrhea   . ESRD (end stage renal disease) ([place]) 03/10/2016  . High cholesterol   . Hypertension   . Nausea 08/2017   Review of Systems:   Review of Systems  Constitutional: Negative for chills, fever, malaise/fatigue and weight loss.  HENT: Positive for hearing loss. Negative for ear discharge, ear pain and tinnitus.   Gastrointestinal: Negative for abdominal pain, diarrhea, nausea and vomiting.    Physical Exam:  Vitals:   09/02/17 1347  BP: (!) 149/84  Pulse: 63  SpO2: 99%  Weight: 198 lb 3.2 oz (89.9 kg)   General: very pleasant male, well-nourished, well-developed  Ears: there is a significant amount of dry cerumen in both EAM L>>R. No blood, erythema or discharge noted. Unable to see TM  Abd: soft, NTND, normoactive bowel sounds   Assessment & Plan:   See Encounters Tab for problem based charting.  Patient seen with Dr. Dareen Piano

## 2017-09-03 NOTE — Assessment & Plan Note (Signed)
Patient complains of hearing loss in his left ear that he states has been going on for a long time but unable to specify onset.  He denies tinnitus, ear pain, ear discharge, dizziness, headaches, fever, and chills.  States he has been using over-the-counter earwax drops without  improvement in hearing.  On exam there is a significant amount of dry cerumen on both EAM, but much more in L than R.  No signs of infection present and unable to visualize tympanic membrane.  Attempted bilateral ear irrigation during visit twice without success.  Patient advised to continue using earwax drops at home and to follow-up in 1 week to attempt bilateral ear irrigation again.  - Continue using ear drops at home  - Follow up in 1 week for bilateral ear irrigation

## 2017-09-03 NOTE — Assessment & Plan Note (Signed)
Patient presents for diarrhea follow-up.  He was recently admitted for viral gastroenteritis and treated with IVF hydration.  He was discharged on 2/5 and reports he has been doing well at home.  Denies abdominal pain, nausea, vomiting, or diarrhea.  Reports a good appetite. Abdominal exam is benign.   - No further intervention at this time. Took opportunity to reinforce importance of low carbohydrate diet given insulin-dependent DM

## 2017-09-05 NOTE — Progress Notes (Signed)
Internal Medicine Clinic Attending  I saw and evaluated the patient.  I personally confirmed the key portions of the history and exam documented by Dr. Santos-Sanchez and I reviewed pertinent patient test results.  The assessment, diagnosis, and plan were formulated together and I agree with the documentation in the resident's note. 

## 2017-09-06 ENCOUNTER — Ambulatory Visit: Payer: Self-pay

## 2017-09-07 ENCOUNTER — Other Ambulatory Visit: Payer: Self-pay

## 2017-09-07 ENCOUNTER — Ambulatory Visit: Payer: Self-pay

## 2017-09-07 ENCOUNTER — Encounter: Payer: Self-pay | Admitting: Internal Medicine

## 2017-09-07 ENCOUNTER — Ambulatory Visit (INDEPENDENT_AMBULATORY_CARE_PROVIDER_SITE_OTHER): Payer: Self-pay | Admitting: Internal Medicine

## 2017-09-07 DIAGNOSIS — H6123 Impacted cerumen, bilateral: Secondary | ICD-10-CM

## 2017-09-07 DIAGNOSIS — H6122 Impacted cerumen, left ear: Secondary | ICD-10-CM

## 2017-09-07 DIAGNOSIS — H11003 Unspecified pterygium of eye, bilateral: Secondary | ICD-10-CM

## 2017-09-07 LAB — GLUCOSE, CAPILLARY: Glucose-Capillary: 346 mg/dL — ABNORMAL HIGH (ref 65–99)

## 2017-09-07 NOTE — Assessment & Plan Note (Signed)
Following up after presentation 2/15 for longstanding hearing loss in the left ear. He was using over the counter earwax drops without improvement in his hearing and was found to have cerumen impaction in both external ear canals. Irrigation was attempted but without success. He was told to continue drops and follow up today for another attempt at irrigation. Through manual loop extraction and pressurized water extraction the ear canal was cleared, there was a small stalk with a few leaves coming off of it that looked like seaweed extracted from the left ear. His hearing improved instantly after disimpaction of the ears.  - continue to monitor, no further drops indicated

## 2017-09-07 NOTE — Progress Notes (Signed)
   CC: Follow-up of hearing loss in the left ear  HPI:  [name] is a 52 y.o. with PMH as listed below who presents for follow up of hearing loss. Please see the assessment and plans for the status of the patient chronic medical problems.    Past Medical History:  Diagnosis Date  . Anemia   . Diabetes mellitus type 2, controlled ([place]) 08/04/2015  . Diabetic retinopathy ([place])   . Diarrhea   . ESRD (end stage renal disease) ([place]) 03/10/2016  . High cholesterol   . Hypertension   . Nausea 08/2017   Review of Systems:  Refer to history of present illness and assessment and plans for pertinent review of systems, all others reviewed and negative  Physical Exam:  Vitals:   09/07/17 1019  BP: 134/71  Pulse: 64  Temp: 97.9 F (36.6 C)  TempSrc: Oral  SpO2: 98%  Weight: 194 lb 14.4 oz (88.4 kg)  Height: 5\' 3"  (1.6 m)   General: Well appearing, no acute distress  HEENT: pterygium of bilateral medial cornea, right ear is completely closed with dry cerumen, left ear has dry cerumen but appears looser, after irrigation the ear canals are clear and the tympanic membranes could be visualized with normal cone of light and no middle ear effusion appreciated   Assessment & Plan:   Cerumen impaction  Following up after presentation 2/15 for longstanding hearing loss in the left ear. He was using over the counter earwax drops without improvement in his hearing and was found to have cerumen impaction in both external ear canals. Irrigation was attempted but without success. He was told to continue drops and follow up today for another attempt at irrigation. Through manual loop extraction and pressurized water extraction the ear canal was cleared, there was a small stalk with a few leaves coming off of it that looked like seaweed extracted from the left ear. His hearing improved instantly after disimpaction of the ears.  - continue to monitor, no further drops indicated   See  Encounters Tab for problem based charting.  Patient discussed with Dr. Angelia Mould

## 2017-09-07 NOTE — Patient Instructions (Addendum)
[name] [place],   Please come back to meet with Marlana Latus and complete your orange card process   FOLLOW-UP INSTRUCTIONS When: Early in April with Dr. Frederico Hamman ( you will need to schedule this in the front desk )  For: recheck of your diabetes  What to bring: all of your medication bottles   - Please call our clinic if you have any problems or questions, we may be able to help you and keep you from a long emergency room wait. Our clinic and after hours phone number is [phone]

## 2017-09-08 NOTE — Progress Notes (Signed)
Internal Medicine Clinic Attending  I saw and evaluated the patient.  I personally confirmed the key portions of the history and exam documented by Dr. Blum and I reviewed pertinent patient test results.  The assessment, diagnosis, and plan were formulated together and I agree with the documentation in the resident's note. 

## 2017-10-04 ENCOUNTER — Other Ambulatory Visit: Payer: Self-pay | Admitting: Internal Medicine

## 2017-10-04 DIAGNOSIS — I10 Essential (primary) hypertension: Secondary | ICD-10-CM

## 2017-10-04 DIAGNOSIS — R21 Rash and other nonspecific skin eruption: Secondary | ICD-10-CM

## 2017-10-04 MED ORDER — INSULIN PEN NEEDLE 32G X 4 MM MISC: 3 refills | Status: DC

## 2017-10-04 MED FILL — RENA-VITE TABLET: 30 days supply | Qty: 30 | Fill #2

## 2017-10-04 MED FILL — TRUEplus LANCETS 30G MISC: 25 days supply | Qty: 100 | Fill #2

## 2017-10-04 MED FILL — JANUVIA 25 MG TABLET: 25 | 30 days supply | Qty: 30 | Fill #4

## 2017-10-04 MED FILL — PRAVASTATIN NA 40 MG TAB: 40 | 30 days supply | Qty: 30 | Fill #3

## 2017-10-04 MED FILL — LEVEMIR FLEXTOUCH 100 UNITS: 100 | 30 days supply | Qty: 9 | Fill #2

## 2017-10-04 MED FILL — TRUE METRIX GLUCOSE TEST ST: 25 days supply | Qty: 100 | Fill #2

## 2017-10-06 ENCOUNTER — Telehealth: Payer: Self-pay | Admitting: Internal Medicine

## 2017-10-06 NOTE — Telephone Encounter (Signed)
Patient goes to dialysis they are wanting to increase medicine but patient will not have enough insulin to last if increase. Pls call

## 2017-10-10 NOTE — Telephone Encounter (Signed)
I have called patient multiple times and have not been able to reach him. Can you contact his outpatient dialysis center and ask about this? If the nephrologist is increasing his dose, can he/she send a new prescription to his pharmacy? I do not feel comfortable sending a new prescription for an unknown dose of insulin. Thank you!

## 2017-10-11 ENCOUNTER — Telehealth: Payer: Self-pay | Admitting: Internal Medicine

## 2017-10-11 NOTE — Telephone Encounter (Signed)
[facility] has raise unit of insulin and if she does the increase he doesn't have enough, pls call the niece back

## 2017-10-11 NOTE — Telephone Encounter (Signed)
Spoke to niece, informed of Dr. Frederico Hamman' note (10/06/17).  Advised  to contact nephrologist & ask if he can order new rx . And provide [name] contact info for the dialysis center if we need to call them. Verbalized understanding.

## 2017-10-12 NOTE — Telephone Encounter (Signed)
This was addressed in separate phone encounter on 10/11/2017. Hubbard Hartshorn, RN, BSN

## 2017-10-27 ENCOUNTER — Other Ambulatory Visit: Payer: Self-pay | Admitting: Internal Medicine

## 2017-11-02 MED FILL — PRAVASTATIN NA 40 MG TAB: 40 | 30 days supply | Qty: 30 | Fill #4

## 2017-11-02 MED FILL — JANUVIA 25 MG TABLET: 25 | 30 days supply | Qty: 30 | Fill #5

## 2017-11-02 MED FILL — LEVEMIR FLEXTOUCH 100 UNITS: 100 | 30 days supply | Qty: 9 | Fill #3

## 2017-11-02 MED FILL — RENA-VITE TABLET: 30 days supply | Qty: 30 | Fill #3

## 2017-11-10 ENCOUNTER — Other Ambulatory Visit: Payer: Self-pay

## 2017-11-10 ENCOUNTER — Other Ambulatory Visit: Payer: Self-pay | Admitting: *Deleted

## 2017-11-10 MED ORDER — INSULIN PEN NEEDLE 32G X 4 MM MISC: 3 refills | Status: DC

## 2017-11-10 NOTE — Telephone Encounter (Signed)
Insulin Pen Needle 32G X 4 MM MISC   Refill request @ outpatient pharmacy.

## 2017-11-21 ENCOUNTER — Telehealth: Payer: Self-pay | Admitting: Pharmacist

## 2017-11-21 ENCOUNTER — Other Ambulatory Visit: Payer: Self-pay | Admitting: Pharmacist

## 2017-11-21 MED ORDER — INSULIN PEN NEEDLE 32G X 4 MM MISC: 3 refills | Status: DC

## 2017-11-21 NOTE — Progress Notes (Signed)
Patient's niece called stating that patient's insulin prescription has been dose-adjusted by outside provider and he is in need of a new prescription. Advised them to schedule [facility] appointment, phone call was cut of (not sure if connection was lost). Will send note to front desk.

## 2017-11-22 ENCOUNTER — Ambulatory Visit: Payer: Self-pay | Admitting: Internal Medicine

## 2017-11-22 VITALS — BP 159/84 | HR 63 | Temp 97.7°F | Wt 202.1 lb

## 2017-11-22 DIAGNOSIS — Z794 Long term (current) use of insulin: Secondary | ICD-10-CM

## 2017-11-22 DIAGNOSIS — Z992 Dependence on renal dialysis: Secondary | ICD-10-CM

## 2017-11-22 DIAGNOSIS — E1165 Type 2 diabetes mellitus with hyperglycemia: Secondary | ICD-10-CM

## 2017-11-22 DIAGNOSIS — E1122 Type 2 diabetes mellitus with diabetic chronic kidney disease: Secondary | ICD-10-CM

## 2017-11-22 DIAGNOSIS — E118 Type 2 diabetes mellitus with unspecified complications: Secondary | ICD-10-CM

## 2017-11-22 DIAGNOSIS — IMO0002 Reserved for concepts with insufficient information to code with codable children: Secondary | ICD-10-CM

## 2017-11-22 DIAGNOSIS — N186 End stage renal disease: Secondary | ICD-10-CM

## 2017-11-22 DIAGNOSIS — E11319 Type 2 diabetes mellitus with unspecified diabetic retinopathy without macular edema: Secondary | ICD-10-CM

## 2017-11-22 LAB — GLUCOSE, CAPILLARY: GLUCOSE-CAPILLARY: 222 mg/dL — AB (ref 65–99)

## 2017-11-22 LAB — POCT GLYCOSYLATED HEMOGLOBIN (HGB A1C): Hemoglobin A1C: 8.6

## 2017-11-22 MED ORDER — INSULIN DETEMIR 100 UNIT/ML FLEXPEN: 20.0000 [IU] | PEN_INJECTOR | Freq: Two times a day (BID) | SUBCUTANEOUS | 11 refills | Status: DC

## 2017-11-22 NOTE — Progress Notes (Signed)
   CC: hyperglycemia   HPI:[name] is a 52 y.o. male who presents today for review of his insulin due to persistent hyperglycemia.  Hyperglycemia 2/2 diabetes mellitus: He was most recently prescribed 28 units of Levemir daily in conjunction with his Januvia. As per his daughter, at one of his more recent HD sessions his Nephrologist suggested that he increase his insulin to 36 am long acting am and 6 units at night of long acting as well. They have not yet made the change due to concerns of running short on the medication as it is prescribed through our program.  Upon review of what data we have available including inconsistent early morning glucometer readings and his increased A1c today to 8.6% (undoubtedly artificially low due to his ESRD) we feel it necessary to increase his insulin but without sufficient data to properly do so. We will therefore increase his Levemir to 20U BID and request that he take his blood glucose at least four times daily and return in two weeks. At that time we may be able to better adjust his insulin based on these readings.   Plan:  Continue Basal insulin but increase to 20U BID, refilled Consider Bolus insulin at follow-up visit in two weeks CBG TID w/ meals and at bed.  Monitor for hypoglycemia and treat with small dose of sweet beverage such as orange juice x 2-3oz.  Past Medical History:  Diagnosis Date  . Anemia   . Diabetes mellitus type 2, controlled ([place]) 08/04/2015  . Diabetic retinopathy ([place])   . Diarrhea   . ESRD (end stage renal disease) ([place]) 03/10/2016  . High cholesterol   . Hypertension   . Nausea 08/2017   Review of Systems:   Review of Systems  Constitutional: Negative for chills, diaphoresis, fever, malaise/fatigue and weight loss.  HENT: Negative for ear pain and sinus pain.   Eyes: Negative for blurred vision, photophobia and redness.  Respiratory: Negative for cough and shortness of breath.   Cardiovascular:  Negative for chest pain and leg swelling.  Gastrointestinal: Negative for constipation, diarrhea, nausea and vomiting.  Genitourinary: Negative for flank pain and urgency.  Musculoskeletal: Negative for myalgias.  Neurological: Negative for dizziness and headaches.  Psychiatric/Behavioral: Negative for depression. The patient is not nervous/anxious.    Physical Exam:  Vitals:   11/22/17 0933  BP: (!) 159/84  Pulse: 63  Temp: 97.7 F (36.5 C)  TempSrc: Oral  SpO2: 98%  Weight: 202 lb 1.6 oz (91.7 kg)   Physical Exam  Constitutional: He appears well-developed and well-nourished. No distress.  HENT:  Head: Normocephalic and atraumatic.  Cardiovascular: Normal rate and regular rhythm.  No murmur heard. Pulmonary/Chest: Effort normal and breath sounds normal. No stridor. No respiratory distress.  Abdominal: Soft. Bowel sounds are normal. He exhibits no distension.  Musculoskeletal: He exhibits no edema.  Skin: He is not diaphoretic.  Psychiatric: He has a normal mood and affect.  Nursing note and vitals reviewed.   Assessment & Plan:   See Encounters Tab for problem based charting.  Patient discussed with Dr. Angelia Mould

## 2017-11-22 NOTE — Patient Instructions (Addendum)
FOLLOW-UP INSTRUCTIONS When: 3 weeks on May 29-31st For: Evaluation of your diabetes What to bring: All of your glucometers and medications  I have increased your insulin levemir to 20 units to be taken 2 times daily.  Take 1 dose before bed, and 1 dose in mid morning.  It is essential that you check your blood glucose levels before breakfast, before lunch, before dinner, and before bed.  We will need at least 2 weeks of data from these recordings to help [name] determine an ideal insulin regimen for you.  If anytime he develop hypoglycemic symptoms such as sweating, confusion, weakness, flulike or passing out, or other concerning symptoms please notify us immediately.  Thank you for your visit to the [facility] today.

## 2017-11-22 NOTE — Assessment & Plan Note (Signed)
Hyperglycemia 2/2 diabetes mellitus: He was most recently prescribed 28 units of Levemir daily in conjunction with his Januvia. As per his daughter, at one of his more recent HD sessions his Nephrologist suggested that he increase his insulin to 36 am long acting am and 6 units at night of long acting as well. They have not yet made the change due to concerns of running short on the medication as it is prescribed through our program.  Upon review of what data we have available including inconsistent early morning glucometer readings and his increased A1c today to 8.6% (undoubtedly artificially low due to his ESRD) we feel it necessary to increase his insulin but without sufficient data to properly do so. We will therefore increase his Levemir to 20U BID and request that he take his blood glucose at least four times daily and return in two weeks. At that time we may be able to better adjust his insulin based on these readings.   Plan:  Continue Basal insulin but increase to 20U BID, refilled Consider Bolus insulin at follow-up visit in two weeks CBG TID w/ meals and at bed.  Monitor for hypoglycemia and treat with small dose of sweet beverage such as orange juice x 2-3oz.

## 2017-11-23 NOTE — Progress Notes (Signed)
Internal Medicine Clinic Attending  Case discussed with Dr. Harbrecht at the time of the visit.  We reviewed the resident's history and exam and pertinent patient test results.  I agree with the assessment, diagnosis, and plan of care documented in the resident's note.   

## 2017-12-02 ENCOUNTER — Other Ambulatory Visit: Payer: Self-pay | Admitting: Internal Medicine

## 2017-12-02 DIAGNOSIS — I10 Essential (primary) hypertension: Secondary | ICD-10-CM

## 2017-12-02 MED FILL — PRAVASTATIN NA 40 MG TAB: 40 | 30 days supply | Qty: 30 | Fill #5

## 2017-12-02 MED FILL — LEVEMIR FLEXTOUCH 100 UNITS: 100 | 30 days supply | Qty: 9 | Fill #4

## 2017-12-02 MED FILL — RENA-VITE TABLET: 30 days supply | Qty: 30 | Fill #4

## 2017-12-02 MED FILL — UNIFINE PENTIPS 32GX5/32": 32G X 4 MM | 30 days supply | Qty: 100 | Fill #0

## 2017-12-02 MED FILL — JANUVIA 25 MG TABLET: 25 | 30 days supply | Qty: 30 | Fill #6

## 2017-12-02 MED FILL — UNIFINE PENTIPS 32GX5/32: 32G X 4 MM | 30 days supply | Qty: 100 | Fill #0

## 2017-12-21 ENCOUNTER — Other Ambulatory Visit: Payer: Self-pay

## 2017-12-21 ENCOUNTER — Emergency Department (HOSPITAL_COMMUNITY): Payer: Medicaid Other

## 2017-12-21 ENCOUNTER — Emergency Department (HOSPITAL_COMMUNITY)
Admission: EM | Admit: 2017-12-21 | Discharge: 2017-12-22 | Disposition: A | Discharge status: Home / Self Care | Payer: Medicaid Other | Attending: Emergency Medicine | Admitting: Emergency Medicine

## 2017-12-21 ENCOUNTER — Encounter (HOSPITAL_COMMUNITY): Payer: Self-pay

## 2017-12-21 DIAGNOSIS — R079 Chest pain, unspecified: Secondary | ICD-10-CM | POA: Diagnosis not present

## 2017-12-21 DIAGNOSIS — N186 End stage renal disease: Secondary | ICD-10-CM | POA: Diagnosis not present

## 2017-12-21 DIAGNOSIS — I120 Hypertensive chronic kidney disease with stage 5 chronic kidney disease or end stage renal disease: Secondary | ICD-10-CM | POA: Insufficient documentation

## 2017-12-21 DIAGNOSIS — Z7984 Long term (current) use of oral hypoglycemic drugs: Secondary | ICD-10-CM | POA: Insufficient documentation

## 2017-12-21 DIAGNOSIS — E119 Type 2 diabetes mellitus without complications: Secondary | ICD-10-CM | POA: Insufficient documentation

## 2017-12-21 DIAGNOSIS — Z79899 Other long term (current) drug therapy: Secondary | ICD-10-CM | POA: Diagnosis not present

## 2017-12-21 DIAGNOSIS — R002 Palpitations: Secondary | ICD-10-CM

## 2017-12-21 LAB — BASIC METABOLIC PANEL
ANION GAP: 11 (ref 5–15)
BUN: 53 mg/dL — ABNORMAL HIGH (ref 6–20)
CALCIUM: 8.9 mg/dL (ref 8.9–10.3)
CO2: 31 mmol/L (ref 22–32)
Chloride: 95 mmol/L — ABNORMAL LOW (ref 101–111)
Creatinine, Ser: 6.88 mg/dL — ABNORMAL HIGH (ref 0.61–1.24)
GFR calc Af Amer: 10 mL/min — ABNORMAL LOW (ref >60)
GFR, EST NON AFRICAN AMERICAN: 8 mL/min — AB (ref >60)
Glucose, Bld: 382 mg/dL — ABNORMAL HIGH (ref 65–99)
POTASSIUM: 4 mmol/L (ref 3.5–5.1)
SODIUM: 137 mmol/L (ref 135–145)

## 2017-12-21 LAB — CBC
HEMATOCRIT: 30.5 % — AB (ref 39.0–52.0)
Hemoglobin: 10.5 g/dL — ABNORMAL LOW (ref 13.0–17.0)
MCH: 29.2 pg (ref 26.0–34.0)
MCHC: 34.4 g/dL (ref 30.0–36.0)
MCV: 85 fL (ref 78.0–100.0)
Platelets: 135 10*3/uL — ABNORMAL LOW (ref 150–400)
RBC: 3.59 MIL/uL — ABNORMAL LOW (ref 4.22–5.81)
RDW: 12.5 % (ref 11.5–15.5)
WBC: 7.3 10*3/uL (ref 4.0–10.5)

## 2017-12-21 NOTE — ED Triage Notes (Signed)
Pt reports that a few hours ago he began to have central CP and felt like his heart is racing, denies other cardiac symptoms. Reports he checked his CBG and it was over 200

## 2017-12-22 LAB — I-STAT TROPONIN, ED: Troponin i, poc: 0 ng/mL (ref 0.00–0.08)

## 2017-12-22 NOTE — ED Notes (Signed)
Pt free from chest pain at this moment. Pt said his CP went away in lobby

## 2017-12-22 NOTE — Discharge Instructions (Signed)
Follow-up with cardiology in the next 3 to 4 days.  The contact information for the [facility] cardiology clinic has been provided in this discharge summary for you to call and make these arrangements.  Return to the emergency department if symptoms significantly worsen or change.

## 2017-12-22 NOTE — ED Provider Notes (Signed)
[facility] EMERGENCY DEPARTMENT Provider Note   CSN: [id number] Arrival date & time: 12/21/17  2200     History   Chief Complaint Chief Complaint  Patient presents with  . Chest Pain    HPI Eric Glenn is a 52 y.o. male.  This patient is a 52 year old male with past medical history of diabetes, hypertension, and end-stage renal disease for which she is on hemodialysis.  He presents after an episode of palpitations that occurred this evening.  He reports while sleeping feeling as if his heart was racing.  He felt pressure in his chest and somewhat lightheaded during this episode.  This lasted for approximately an hour, then resolved and he is currently symptom-free.  He denies any chest pain, shortness of breath, diaphoresis, diaphoresis, or radiation to the arm or jaw either at present or during the episode.  He has no prior cardiac history.  The history is provided by the patient.  Chest Pain   This is a new problem. The current episode started 1 to 2 hours ago. The problem has been resolved. Associated with: sleeping. The pain is present in the substernal region. The pain is mild. The quality of the pain is described as pressure-like. Associated symptoms include palpitations. Pertinent negatives include no cough, no diaphoresis, no nausea and no shortness of breath. He has tried nothing for the symptoms. There are no known risk factors.    Past Medical History:  Diagnosis Date  . Anemia   . Diabetes mellitus type 2, controlled ([place]) 08/04/2015  . Diabetic retinopathy ([place])   . Diarrhea   . ESRD (end stage renal disease) ([place]) 03/10/2016  . High cholesterol   . Hypertension   . Nausea 08/2017    Patient Active Problem List   Diagnosis Date Noted  . Hearing loss due to cerumen impaction, left 09/03/2017  . Gastroenteritis 08/22/2017  . Diarrhea 08/22/2017  . Excessive cerumen in both ear canals 06/22/2017  . Muscle cramps 01/24/2017  .  Healthcare maintenance 07/21/2016  . Chest pain 03/23/2016  . ESRD (end stage renal disease) ([place]) 03/10/2016  . Metabolic acidosis 46/27/0350  . Nasal septal deviation 12/10/2015  . Asthma 10/01/2015  . Hypersomnia 10/01/2015  . Obesity 10/01/2015  . Acute respiratory failure with hypoxia ([place]) 08/29/2015  . Orthopnea 08/29/2015  . Shortness of breath   . DOE (dyspnea on exertion) 08/28/2015  . Normocytic anemia 08/28/2015  . Suspected OSA  08/28/2015  . Constipation 08/28/2015  . Hyperlipidemia 08/19/2015  . Pedal edema 08/19/2015  . Sinusitis, chronic 08/19/2015  . Essential hypertension 08/04/2015  . Pain in the chest 08/04/2015  . Uncontrolled type 2 diabetes mellitus with complication ([place]) 09/38/1829  . Diabetic retinopathy associated with type 2 diabetes mellitus ([place]) 08/04/2015  . Proteinuria 08/04/2015    Past Surgical History:  Procedure Laterality Date  . AV FISTULA PLACEMENT Right 09/04/2015   Procedure: ARTERIOVENOUS (AV) FISTULA CREATION;  Surgeon: Angelia Mould, MD;  Location: [facility];  Service: Vascular;  Laterality: Right;  . AV FISTULA PLACEMENT Right 01/22/2016   Procedure: RIGHT UPPER ARM BRACHIOCEPHALIC ARTERIOVENOUS (AV) FISTULA CREATION;  Surgeon: Angelia Mould, MD;  Location: [facility];  Service: Vascular;  Laterality: Right;  . LIGATION OF ARTERIOVENOUS  FISTULA Right 01/22/2016   Procedure: LIGATION OF RIGHT FOREARM ARTERIOVENOUS  FISTULA;  Surgeon: Angelia Mould, MD;  Location: [facility];  Service: Vascular;  Laterality: Right;  . PERIPHERAL VASCULAR CATHETERIZATION N/A 01/12/2016   Procedure: Fistulagram;  Surgeon:  Angelia Mould, MD;  Location: [facility];  Service: Cardiovascular;  Laterality: N/A;  . UMBILICAL HERNIA REPAIR          Home Medications    Prior to Admission medications   Medication Sig Start Date End Date Taking? Authorizing Provider  albuterol (PROVENTIL HFA;VENTOLIN HFA) 108 (90 Base) MCG/ACT inhaler  Inhale 2 puffs into the lungs every 6 (six) hours as needed for wheezing or shortness of breath. 12/23/16   Ledell Noss, MD  amLODipine (NORVASC) 10 MG tablet Take 1 tablet (10 mg total) by mouth daily. 07/21/17   Kalman Shan Ratliff, DO  atorvastatin (LIPITOR) 20 MG tablet Take 1 tablet (20 mg total) by mouth daily. 07/21/17   Kalman Shan Ratliff, DO  Blood Glucose Monitoring Suppl (TRUE METRIX METER) DEVI 1 each by Does not apply route 4 (four) times daily. IM Program/HOPE fund Patient not taking: Reported on 08/22/2017 01/10/17   Lorella Nimrod, MD  Brinzolamide-Brimonidine 1-0.2 % SUSP Place 1 drop into the left eye daily.    [provider]  carvedilol (COREG) 12.5 MG tablet TAKE 1 Tablet  BY MOUTH TWICE DAILY 10/04/17   Welford Roche, MD  diphenhydrAMINE (BENADRYL) 25 MG tablet Take 0.5 tablets (12.5 mg total) by mouth 3 (three) times a week. 01/24/17   Valinda Party, DO  epoetin alfa (EPOGEN,PROCRIT) 48270 UNIT/ML injection Inject 0.5 mLs (5,000 Units total) into the skin every Wednesday. Patient not taking: Reported on 08/22/2017 03/31/16   Kalman Shan Ratliff, DO  fluticasone (FLONASE) 50 MCG/ACT nasal spray Place 1 spray into both nostrils daily. Patient taking differently: Place 1 spray into both nostrils daily as needed.  06/22/17 06/22/18  Molt, Bethany, DO  glucose blood (TRUE METRIX BLOOD GLUCOSE TEST) test strip IM program/HOPE fund, Use as instructed 4 times a day. Patient not taking: Reported on 08/22/2017 01/10/17   Lorella Nimrod, MD  hydrALAZINE (APRESOLINE) 100 MG tablet TAKE 1 Tablet BY MOUTH 3 TIMES DAILY 10/04/17   Welford Roche, MD  Insulin Detemir (LEVEMIR) 100 UNIT/ML Pen Inject 20 Units into the skin 2 (two) times daily. IM program, d/c basaglar 11/22/17   Kathi Ludwig, MD  Insulin Pen Needle 32G X 4 MM MISC Use 1 needle to inject insulin. 11/21/17   Welford Roche, MD  Lancets MISC 4 Times a day. IM Program/HOPE Fund. Patient not  taking: Reported on 08/22/2017 01/10/17   Lorella Nimrod, MD  multivitamin (RENA-VIT) TABS tablet Take 1 tablet by mouth daily. 07/29/17   [provider]  ofloxacin (OCUFLOX) 0.3 % ophthalmic solution Place 1 drop into the left eye daily.    [provider]  Omega-3 Fatty Acids (FISH OIL) 1200 MG CAPS Take 1,200 mg by mouth 2 (two) times daily.     [provider]  pravastatin (PRAVACHOL) 40 MG tablet Take 40 mg by mouth every evening. 08/15/17   [provider]  sitaGLIPtin (JANUVIA) 25 MG tablet Take 1 tablet (25 mg total) by mouth daily. 06/28/17   Santos-Sanchez, Merlene Morse, MD  sodium bicarbonate 650 MG tablet Take 1 tablet (650 mg total) by mouth 3 (three) times daily with meals. 08/23/17   Neva Seat, MD  triamcinolone cream (KENALOG) 0.1 % APPLY TO THE AFFECTED AREA TWICE DAILY 07/15/17   Welford Roche, MD    Family History Family History  Problem Relation Age of Onset  . Hypertension Mother   . Hyperlipidemia Mother   . Diabetes Mellitus II Sister     Social History Social  History   Tobacco Use  . Smoking status: Former Smoker    Last attempt to quit: 10/22/2003    Years since quitting: 14.1  . Smokeless tobacco: Never Used  Substance Use Topics  . Alcohol use: No    Alcohol/week: 0.0 oz  . Drug use: No     Allergies   Poractant alfa and Pork-derived products   Review of Systems Review of Systems  Constitutional: Negative for diaphoresis.  Respiratory: Negative for cough and shortness of breath.   Cardiovascular: Positive for chest pain and palpitations.  Gastrointestinal: Negative for nausea.  All other systems reviewed and are negative.    Physical Exam Updated Vital Signs BP 136/69 (BP Location: Left Arm)   Pulse 63   Temp 97.8 F (36.6 C) (Oral)   Resp 16   SpO2 97%   Physical Exam  Constitutional: He is oriented to person, place, and time. He appears well-developed and well-nourished. No distress.  HENT:    Head: Normocephalic and atraumatic.  Mouth/Throat: Oropharynx is clear and moist.  Neck: Normal range of motion. Neck supple.  Cardiovascular: Normal rate and regular rhythm. Exam reveals no friction rub.  No murmur heard. Pulmonary/Chest: Effort normal and breath sounds normal. No respiratory distress. He has no wheezes. He has no rales.  Abdominal: Soft. Bowel sounds are normal. He exhibits no distension. There is no tenderness.  Musculoskeletal: Normal range of motion. He exhibits no edema.       Right lower leg: Normal. He exhibits no tenderness and no edema.       Left lower leg: Normal. He exhibits no tenderness and no edema.  Neurological: He is alert and oriented to person, place, and time. Coordination normal.  Skin: Skin is warm and dry. He is not diaphoretic.  Nursing note and vitals reviewed.    ED Treatments / Results  Labs (all labs ordered are listed, but only abnormal results are displayed) Labs Reviewed  BASIC METABOLIC PANEL - Abnormal; Notable for the following components:      Result Value   Chloride 95 (*)    Glucose, Bld 382 (*)    BUN 53 (*)    Creatinine, Ser 6.88 (*)    GFR calc non Af Amer 8 (*)    GFR calc Af Amer 10 (*)    All other components within normal limits  CBC - Abnormal; Notable for the following components:   RBC 3.59 (*)    Hemoglobin 10.5 (*)    HCT 30.5 (*)    Platelets 135 (*)    All other components within normal limits  I-STAT TROPONIN, ED  I-STAT TROPONIN, ED    ED ECG REPORT   Date: 12/22/2017  Rate: 76  Rhythm: normal sinus rhythm  QRS Axis: normal  Intervals: normal  ST/T Wave abnormalities: normal  Conduction Disutrbances:none  Narrative Interpretation:   Old EKG Reviewed: unchanged from prior ecg  I have personally reviewed the EKG tracing and agree with the computerized printout as noted.   Radiology Dg Chest 2 View  Result Date: 12/21/2017 CLINICAL DATA:  Initial evaluation for acute chest pain. EXAM: CHEST  - 2 VIEW COMPARISON:  Prior radiograph from 03/22/2016. FINDINGS: Transverse heart size at the upper limits of normal. Mediastinal silhouette within normal limits. Lungs normally inflated. No focal infiltrates. No pulmonary edema or pleural effusion. No pneumothorax. No acute osseous abnormality. IMPRESSION: No active cardiopulmonary disease. Electronically Signed   By: Jeannine Boga M.D.   On: 12/21/2017 22:57  Procedures Procedures (including critical care time)  Medications Ordered in ED Medications - No data to display   Initial Impression / Assessment and Plan / ED Course  I have reviewed the triage vital signs and the nursing notes.  Pertinent labs & imaging results that were available during my care of the patient were reviewed by me and considered in my medical decision making (see chart for details).  Patient's EKG is unchanged from prior studies and troponin V hours after symptom resolution is negative.  The patient currently feels well.  At this point, the patient would like to go home.  He has no prior cardiac history but I will have him follow-up with cardiology to discuss a stress test, return if worsens.  Final Clinical Impressions(s) / ED Diagnoses   Final diagnoses:  None    ED Discharge Orders    None       Veryl Speak, MD 12/22/17 [phone]

## 2017-12-22 NOTE — ED Notes (Signed)
Pt does not speak english well. Pt niece at bedside.

## 2017-12-27 MED FILL — LEVEMIR FLEXTOUCH 100 UNITS: 100 | 30 days supply | Qty: 9 | Fill #5

## 2018-01-05 ENCOUNTER — Other Ambulatory Visit: Payer: Self-pay | Admitting: Internal Medicine

## 2018-01-05 MED FILL — RENA-VITE TABLET: 30 days supply | Qty: 30 | Fill #5

## 2018-01-05 MED FILL — JANUVIA 25 MG TABLET: 25 | 30 days supply | Qty: 30 | Fill #7

## 2018-01-09 ENCOUNTER — Other Ambulatory Visit: Payer: Self-pay | Admitting: Internal Medicine

## 2018-01-16 ENCOUNTER — Other Ambulatory Visit: Payer: Self-pay | Admitting: Internal Medicine

## 2018-01-16 DIAGNOSIS — I10 Essential (primary) hypertension: Secondary | ICD-10-CM

## 2018-01-16 DIAGNOSIS — E1122 Type 2 diabetes mellitus with diabetic chronic kidney disease: Secondary | ICD-10-CM

## 2018-01-16 DIAGNOSIS — N184 Chronic kidney disease, stage 4 (severe): Secondary | ICD-10-CM

## 2018-01-16 NOTE — Telephone Encounter (Signed)
Patient is requesting refill hydralazine 100mg , true metrix blood test strips, atorvastin, amlodipine 10 mg, carvedilol 12.5 mg, pravachol 40mg  tablet. [facility]'t using the [facility] because he isn't getting his medicine and patient been without his medicine for a while, pls send [facility] call patient to make sure this all the medicine that needs refills

## 2018-01-17 MED ORDER — GLUCOSE BLOOD VI STRP: ORAL_STRIP | 12 refills | Status: DC

## 2018-01-17 MED ORDER — AMLODIPINE BESYLATE 10 MG PO TABS: 10.0000 mg | ORAL_TABLET | Freq: Every day | ORAL | 0 refills | Status: DC

## 2018-01-17 MED ORDER — CARVEDILOL 12.5 MG PO TABS: 12.5000 mg | ORAL_TABLET | Freq: Two times a day (BID) | ORAL | 0 refills | Status: DC

## 2018-01-17 MED ORDER — ATORVASTATIN CALCIUM 40 MG PO TABS: 40.0000 mg | ORAL_TABLET | Freq: Every day | ORAL | 0 refills | Status: DC

## 2018-01-17 MED FILL — CARVEDILOL 12.5 MG TABLET: 12.5 | 30 days supply | Qty: 60 | Fill #0

## 2018-01-17 MED FILL — ATORVASTATIN 40 MG TABLET: 40 | 30 days supply | Qty: 30 | Fill #0

## 2018-01-17 MED FILL — CONTOUR NEXT STRIPS: 25 days supply | Qty: 100 | Fill #0

## 2018-01-17 MED FILL — AMLODIPINE BESYLATE 10 MG T: 10 | 30 days supply | Qty: 30 | Fill #0

## 2018-01-17 NOTE — Telephone Encounter (Signed)
Please schedule pt an appt within 2 weeks for HTN f/u and med reconciliation per Dr Isac Sarna.

## 2018-01-17 NOTE — Telephone Encounter (Signed)
Received message from clinic that patient is requesting refills for several medications that he was supposed to received through a mailed order but has not. As a result he has been off of the medications for the past 3-4 weeks. The medications include:    Amlodipine 10 mg daily  Coreg 12.5 mg daily  Hydralazine 100 mg 3 times a day Glucose test strips  Pravastatin 40 mg daily   Eric Glenn was not home when I returned his call. He was at hemodialysis. I spoke to his niece Eric Glenn who is his caregiver and whom I have met multiple times in clinic at his follow up visits. She states she has tried calling the pharmacy twice for the above prescriptions but has yet to receive the medications in the mail. She is requesting to have all the medications sent to [facility] as it is easier for her to get the medication from a single pharmacy.   I called [facility] to review patient's home medications. They had been refilling the medications listed above with exception of pravastatin until 10/17/2017 when his subscription expired. He has not submitted a new application which is why he has not received any medications recently. In addition, they have also been refilling atorvastatin 20 mg QD and glipizide 5 mg (the latter was discontinued on 07/2017 after he met with [facility] diabetes educator ans was started on Lantus). Per pharmacist, the last time he refilled the medications was 09/2017 which should have lasted him until 11/2017.   He also takes insulin at home for poorly controlled diabetes. Niece states he does have insulin at home and has not missed any doses.  I have requested the front desk to call patient to schedule an appointment at [facility] within 2 weeks for HTN follow up and medication reconciliation. Patient's niece is aware of this and will be expecting a call. I have asked her to bring ALL the medications he has at home so the provider who sees him in clinic can  review them and manage them accordingly.    At this time, I will only refill Amlodipine 10 mg QD and Coreg 12.5 mg BID for 30 days, and the glucose test strips. Will hold on resuming Hydralazine as he has been off of all BP medications for almost a month and this could lead to hypotension. I will not refill pravastatin as patient is supposed to be on atorvastatin. I sent a prescription for atorvastatin 40 mg for 30 days as well. I discussed all medication changes with care giver.   At follow up visit, please ensure patient has been taking both amlodipine and coreg as prescribed. Please provide patient with additional refills as I only sent a 30 day prescription. If he remains hypertensive, please resume Hydralazine at a lower dose, consider 25 mg TID. I have made changes to his medication list to reflect recent changes. Please make additional changes if needed when he is seen in clinic. Please provide patient with a copy of his new medication list. Have patient follow up with me and/or sooner in [facility] if needed for up titration of BP medications.   Contac information for care giver: Eric Glenn ( pronunced Joselyn) [phone] 

## 2018-01-18 ENCOUNTER — Other Ambulatory Visit: Payer: Self-pay | Admitting: Internal Medicine

## 2018-01-18 NOTE — Progress Notes (Signed)
Received message from clinic that patient is requesting refills for several medications that he was supposed to received through a mailed order but has not. As a result he has been off of the medications for the past 3-4 weeks. The medications include:    Amlodipine 10 mg daily  Coreg 12.5 mg daily  Hydralazine 100 mg 3 times a day Glucose test strips  Pravastatin 40 mg daily   Mr. Hensch was not home when I returned his call. He was at hemodialysis. I spoke to his niece Llasselen Cruz who is his caregiver and whom I have met multiple times in clinic at his follow up visits. She states she has tried calling the pharmacy twice for the above prescriptions but has yet to receive the medications in the mail. She is requesting to have all the medications sent to [facility] as it is easier for her to get the medication from a single pharmacy.   I called [facility] to review patient's home medications. They had been refilling the medications listed above with exception of pravastatin until 10/17/2017 when his subscription expired. He has not submitted a new application which is why he has not received any medications recently. In addition, they have also been refilling atorvastatin 20 mg QD and glipizide 5 mg (the latter was discontinued on 07/2017 after he met with [facility] diabetes educator ans was started on Lantus). Per pharmacist, the last time he refilled the medications was 09/2017 which should have lasted him until 11/2017.   He also takes insulin at home for poorly controlled diabetes. Niece states he does have insulin at home and has not missed any doses.  I have requested the front desk to call patient to schedule an appointment at [facility] within 2 weeks for HTN follow up and medication reconciliation. Patient's niece is aware of this and will be expecting a call. I have asked her to bring ALL the medications he has at home so the provider who sees him in clinic can  review them and manage them accordingly.    At this time, I will only refill Amlodipine 10 mg QD and Coreg 12.5 mg BID for 30 days, and the glucose test strips. Will hold on resuming Hydralazine as he has been off of all BP medications for almost a month and this could lead to hypotension. I will not refill pravastatin as patient is supposed to be on atorvastatin. I sent a prescription for atorvastatin 40 mg for 30 days as well. I discussed all medication changes with care giver.   At follow up visit, please ensure patient has been taking both amlodipine and coreg as prescribed. Please provide patient with additional refills as I only sent a 30 day prescription. If he remains hypertensive, please resume Hydralazine at a lower dose, consider 25 mg TID. I have made changes to his medication list to reflect recent changes. Please make additional changes if needed when he is seen in clinic. Please provide patient with a copy of his new medication list. Have patient follow up with me and/or sooner in [facility] if needed for up titration of BP medications.   Contac information for care giver: Llasselen Cruz ( pronunced Joselyn) [phone] 

## 2018-01-20 MED FILL — LEVEMIR FLEXTOUCH 100 UNITS: 100 | 30 days supply | Qty: 9 | Fill #6

## 2018-02-03 ENCOUNTER — Encounter: Payer: Self-pay | Admitting: Internal Medicine

## 2018-02-13 ENCOUNTER — Other Ambulatory Visit: Payer: Self-pay | Admitting: Internal Medicine

## 2018-02-13 DIAGNOSIS — I10 Essential (primary) hypertension: Secondary | ICD-10-CM

## 2018-02-13 MED FILL — JANUVIA 25 MG TABLET: 25 | 30 days supply | Qty: 30 | Fill #8

## 2018-02-13 MED FILL — UNIFINE PENTIPS 32GX5/32: 32G X 4 MM | 30 days supply | Qty: 100 | Fill #1

## 2018-02-13 MED FILL — RENA-VITE TABLET: 30 days supply | Qty: 30 | Fill #6

## 2018-02-13 MED FILL — LEVEMIR FLEXTOUCH 100 UNITS: 100 | 30 days supply | Qty: 9 | Fill #7

## 2018-02-13 MED FILL — UNIFINE PENTIPS 32GX5/32": 32G X 4 MM | 30 days supply | Qty: 100 | Fill #1

## 2018-02-14 ENCOUNTER — Other Ambulatory Visit: Payer: Self-pay | Admitting: *Deleted

## 2018-02-14 DIAGNOSIS — I10 Essential (primary) hypertension: Secondary | ICD-10-CM

## 2018-02-14 NOTE — Telephone Encounter (Signed)
Please re-send rxs with "IM Program" if appropriate. Thanks

## 2018-02-15 MED ORDER — AMLODIPINE BESYLATE 10 MG PO TABS: 10.0000 mg | ORAL_TABLET | Freq: Every day | ORAL | 2 refills | Status: DC

## 2018-02-15 MED ORDER — ATORVASTATIN CALCIUM 40 MG PO TABS: 40.0000 mg | ORAL_TABLET | Freq: Every day | ORAL | 2 refills | Status: DC

## 2018-02-15 MED ORDER — CARVEDILOL 12.5 MG PO TABS: 12.5000 mg | ORAL_TABLET | Freq: Two times a day (BID) | ORAL | 2 refills | Status: DC

## 2018-03-01 ENCOUNTER — Encounter: Payer: Self-pay | Admitting: Dietician

## 2018-03-01 ENCOUNTER — Ambulatory Visit: Payer: Self-pay | Admitting: Dietician

## 2018-03-01 ENCOUNTER — Ambulatory Visit: Payer: Self-pay | Admitting: Internal Medicine

## 2018-03-01 VITALS — BP 175/95 | HR 75 | Temp 98.2°F | Wt 198.7 lb

## 2018-03-01 DIAGNOSIS — E118 Type 2 diabetes mellitus with unspecified complications: Principal | ICD-10-CM

## 2018-03-01 DIAGNOSIS — E881 Lipodystrophy, not elsewhere classified: Secondary | ICD-10-CM

## 2018-03-01 DIAGNOSIS — E1165 Type 2 diabetes mellitus with hyperglycemia: Secondary | ICD-10-CM

## 2018-03-01 DIAGNOSIS — I10 Essential (primary) hypertension: Secondary | ICD-10-CM

## 2018-03-01 DIAGNOSIS — I120 Hypertensive chronic kidney disease with stage 5 chronic kidney disease or end stage renal disease: Secondary | ICD-10-CM

## 2018-03-01 DIAGNOSIS — IMO0002 Reserved for concepts with insufficient information to code with codable children: Secondary | ICD-10-CM

## 2018-03-01 DIAGNOSIS — Z992 Dependence on renal dialysis: Secondary | ICD-10-CM

## 2018-03-01 DIAGNOSIS — H548 Legal blindness, as defined in USA: Secondary | ICD-10-CM

## 2018-03-01 DIAGNOSIS — Z794 Long term (current) use of insulin: Secondary | ICD-10-CM

## 2018-03-01 DIAGNOSIS — Z79899 Other long term (current) drug therapy: Secondary | ICD-10-CM

## 2018-03-01 DIAGNOSIS — E1122 Type 2 diabetes mellitus with diabetic chronic kidney disease: Secondary | ICD-10-CM

## 2018-03-01 DIAGNOSIS — N186 End stage renal disease: Secondary | ICD-10-CM

## 2018-03-01 LAB — POCT GLYCOSYLATED HEMOGLOBIN (HGB A1C): Hemoglobin A1C: 9.8 % — AB (ref 4.0–5.6)

## 2018-03-01 LAB — GLUCOSE, CAPILLARY: Glucose-Capillary: 330 mg/dL — ABNORMAL HIGH (ref 70–99)

## 2018-03-01 MED ORDER — INSULIN PEN NEEDLE 32G X 4 MM MISC: 12 refills | Status: DC

## 2018-03-01 MED ORDER — ATORVASTATIN CALCIUM 40 MG PO TABS: 40.0000 mg | ORAL_TABLET | Freq: Every day | ORAL | 2 refills | Status: DC

## 2018-03-01 MED ORDER — AMLODIPINE BESYLATE 10 MG PO TABS: 10.0000 mg | ORAL_TABLET | Freq: Every day | ORAL | 2 refills | Status: DC

## 2018-03-01 MED ORDER — CARVEDILOL 12.5 MG PO TABS: 12.5000 mg | ORAL_TABLET | Freq: Two times a day (BID) | ORAL | 2 refills | Status: DC

## 2018-03-01 MED FILL — CARVEDILOL 12.5 MG TABLET: 12.5 | 30 days supply | Qty: 60 | Fill #0

## 2018-03-01 MED FILL — AMLODIPINE BESYLATE 10 MG T: 10 | 30 days supply | Qty: 30 | Fill #0

## 2018-03-01 MED FILL — ATORVASTATIN 40 MG TABLET: 40 | 30 days supply | Qty: 30 | Fill #0

## 2018-03-01 NOTE — Progress Notes (Signed)
Diabetes Documentation:  asked to assist with diabetes self management training.   Diabetes Eye exam- Per family member, they are working on getting him an eye exam and will let us know when he has had it to request the notes.   Diabetes Foot exam- done today except tuning fork and posterior tibial artery pulse.   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 They are using 32 gauge x 5 mm Barbee Cough pen needles from [employer]. They are pinching up, but will work on technique to assure no intramuscualr injection. They will also rotate injection sites to allow each injection site to be used no more than 1x/30 days and avoid areas that currently have lipohypertrophy.   Debera Lat, RD 03/01/2018 4:25 PM.

## 2018-03-01 NOTE — Progress Notes (Signed)
Opened in error

## 2018-03-01 NOTE — Patient Instructions (Signed)
Mr. [name],   Continue usando Levemir 20 [address] veces al dia. Saque una cita de seguimiento conmigo en 2 semanas para checar como esta su azuca.   Sheela Stack su glucometro a esa cita para revisarlo y todas sus medicines.   Le envie rellenados de sus medicamentos a la farmacia de [place]. Llamenos si le have falta algun otro medicamento.   Llamenos si tiene [place] duda o pregunta.   - Dra. Frederico Hamman

## 2018-03-01 NOTE — Patient Instructions (Signed)
Instructions for preventing Injection Bumps (Lipohypertrophy):   Rotate your injection site each time you inject. Use an inejction site only one time in 30 days  Keep track of your injection locations (you can use a chart or even an app).  Use a fresh needle each time.  When injecting near a previous site, leave about an inch of space in between the two.  Treatment for lipohypertrophy (injection bumps)  will usually involve making sure you don't inject the affected area of skin until it has fully healed. The [facility] Diabetes Service recommends avoiding into the affected area for at least 2 to 3 months.

## 2018-03-02 ENCOUNTER — Encounter: Payer: Self-pay | Admitting: Internal Medicine

## 2018-03-02 MED FILL — UNIFINE PENTIPS 32GX5/32": 32G X 4 MM | 30 days supply | Qty: 100 | Fill #0

## 2018-03-02 MED FILL — UNIFINE PENTIPS 32GX5/32: 32G X 4 MM | 30 days supply | Qty: 100 | Fill #0

## 2018-03-02 NOTE — Assessment & Plan Note (Signed)
ESRD: He is compliant with HD on T/Th/S. Previously on Aranesp. Last recorded Hgb 10.5 (stable).

## 2018-03-02 NOTE — Assessment & Plan Note (Signed)
T2DM, uncontrolled: [name] presents for DM follow up. He is on Levemir 20U BID and Januvia. He reports compliance with his medications, but over the past 6 months his A1c has been increasing and his average BG are 350 with highest one in the 500s. 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 She usually injects him in his L arm of R-sided abdomen. For the past 4-5 months she has noticed wheal formation at the site on insulin injection that resolves after she rubs it for a few minutes. She has also noticed increased resistance when trying to inject insulin at these sites. 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 I suspect he has not been getting insulin for these last few months as he has only been injecting it superficially. Diabetes educator provided education on appropriate injection technique and advise to change site of injection. Will continue Levemir at current dose. I worry he might become hypoglycemic if we increase it as he has likely not been getting insulin in his system. Will have him follow up in 2 weeks.  - Educated about injection technique and site. 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 Asked him to bring BG meter

## 2018-03-02 NOTE — Assessment & Plan Note (Signed)
Essential HTN, uncontrolled: Patient on Hydralazine 100 mg TID (prescribed by nephrologist), amlodipine 10 mg, and Coreg 12.5 mg BID. Usually compliant with medications until he runs out of tablets. BP today 175/95. Has been taking Hydralazine, but ran out of amlodipine and Coreg 1 week ago. I sent 3 month supply prescription for both amlodipine and Coreg. He was using a Holiday representative previously but were receiving his meds 203 weeks late and are not interested in continuing this service. His medicines are being sent to [facility]. I again asked patient and family to call 1 week before he runs out of medications.  - Refilled amlodipine and Coreg  - Will follow up in 2 weeks

## 2018-03-02 NOTE — Progress Notes (Signed)
CC: Follow up for T2DM, HTN, and ESRD    HPI:  [name] is a 52 y.o. male with PMH listed below who presents to clinic for follow up of T2DM, HTN, and ESRD.   T2DM, uncontrolled: [name] presents for DM follow up. He is on Levemir 20U BID and Januvia. He reports compliance with his medications, but over the past 6 months his A1c has been increasing and his average BG are 350 with highest one in the 500s. 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 She usually injects him in his L arm of R-sided abdomen. For the past 4-5 months she has noticed wheal formation at the site on insulin injection that resolves after she rubs it for a few minutes. She has also noticed increased resistance when trying to inject insulin at these sites. 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 I suspect he has not been getting insulin for these last few months as he has only been injecting it superficially. Diabetes educator provided education on appropriate injection technique and advise to change site of injection. Will continue Levemir at current dose. I worry he might become hypoglycemic if we increase it as he has likely not been getting insulin in his system. Will have him follow up in 2 weeks.  - Educated about injection technique and site. 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 Asked him to bring BG meter   Essential HTN, uncontrolled: Patient on Hydralazine 100 mg TID (prescribed by nephrologist), amlodipine 10 mg, and Coreg 12.5 mg BID. Usually compliant with medications until he runs out of tablets. BP today 175/95. Has been taking Hydralazine, but ran out of amlodipine and Coreg 1 week ago. I sent 3 month supply prescription for both amlodipine and Coreg. He was using a [employer] representative previously but were receiving his meds 203 weeks late and are not interested  in continuing this service. His medicines are being sent to [facility]. I again asked patient and family to call 1 week before he runs out of medications.  - Refilled amlodipine and Coreg  - Will follow up in 2 weeks   ESRD: He is compliant with HD on T/Th/S. Previously on Aranesp. Last recorded Hgb 10.5 (stable).   Past Medical History:  Diagnosis Date  . Anemia   . Diabetes mellitus type 2, controlled (Eric Glenn) 08/04/2015  . Diabetic retinopathy ([place])   . Diarrhea   . ESRD (end stage renal disease) (Eric Glenn) 03/10/2016  . High cholesterol   . Hypertension   . Nausea 08/2017   Review of Systems:   Review of Systems  Constitutional: Negative for chills, fever and malaise/fatigue.  Respiratory: Positive for shortness of breath. Negative for cough.   Cardiovascular: Negative for chest pain, palpitations and leg swelling.  Gastrointestinal: Negative for abdominal pain, blood in stool, constipation, diarrhea, nausea and vomiting.  Musculoskeletal: Positive for myalgias.  Neurological: Negative for dizziness and headaches.   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  Patient discussed with Dr. Dareen Piano

## 2018-03-02 NOTE — Progress Notes (Signed)
Internal Medicine Clinic Attending  Case discussed with Dr. Santos-Sanchez at the time of the visit.  We reviewed the resident's history and exam and pertinent patient test results.  I agree with the assessment, diagnosis, and plan of care documented in the resident's note.    

## 2018-03-09 MED FILL — JANUVIA 25 MG TABLET: 25 | 30 days supply | Qty: 30 | Fill #9

## 2018-03-09 MED FILL — LEVEMIR FLEXTOUCH 100 UNITS: 100 | 30 days supply | Qty: 9 | Fill #8

## 2018-03-15 ENCOUNTER — Encounter: Payer: Self-pay | Admitting: Internal Medicine

## 2018-03-24 MED FILL — LEVEMIR FLEXTOUCH 100 UNITS: 100 | 30 days supply | Qty: 12 | Fill #0

## 2018-03-24 MED FILL — RENA-VITE TABLET: 30 days supply | Qty: 30 | Fill #7

## 2018-03-29 ENCOUNTER — Ambulatory Visit: Payer: Self-pay

## 2018-03-29 ENCOUNTER — Encounter: Payer: Self-pay | Admitting: Internal Medicine

## 2018-03-31 MED FILL — AMLODIPINE BESYLATE 10 MG T: 10 | 30 days supply | Qty: 30 | Fill #1

## 2018-03-31 MED FILL — CARVEDILOL 12.5 MG TABLET: 12.5 | 30 days supply | Qty: 60 | Fill #1

## 2018-03-31 MED FILL — ATORVASTATIN 40 MG TABLET: 40 | 30 days supply | Qty: 30 | Fill #1

## 2018-04-18 ENCOUNTER — Other Ambulatory Visit: Payer: Self-pay

## 2018-04-18 ENCOUNTER — Encounter (HOSPITAL_COMMUNITY): Payer: Self-pay | Admitting: Emergency Medicine

## 2018-04-18 ENCOUNTER — Emergency Department (HOSPITAL_COMMUNITY): Payer: Self-pay

## 2018-04-18 ENCOUNTER — Emergency Department (HOSPITAL_COMMUNITY)
Admission: EM | Admit: 2018-04-18 | Discharge: 2018-04-19 | Disposition: A | Discharge status: Home / Self Care | Payer: Self-pay | Attending: Emergency Medicine | Admitting: Emergency Medicine

## 2018-04-18 DIAGNOSIS — M4802 Spinal stenosis, cervical region: Secondary | ICD-10-CM | POA: Insufficient documentation

## 2018-04-18 DIAGNOSIS — E1122 Type 2 diabetes mellitus with diabetic chronic kidney disease: Secondary | ICD-10-CM | POA: Insufficient documentation

## 2018-04-18 DIAGNOSIS — R531 Weakness: Secondary | ICD-10-CM | POA: Insufficient documentation

## 2018-04-18 DIAGNOSIS — R202 Paresthesia of skin: Secondary | ICD-10-CM | POA: Insufficient documentation

## 2018-04-18 DIAGNOSIS — R42 Dizziness and giddiness: Secondary | ICD-10-CM | POA: Insufficient documentation

## 2018-04-18 DIAGNOSIS — I120 Hypertensive chronic kidney disease with stage 5 chronic kidney disease or end stage renal disease: Secondary | ICD-10-CM | POA: Insufficient documentation

## 2018-04-18 DIAGNOSIS — Z79899 Other long term (current) drug therapy: Secondary | ICD-10-CM | POA: Insufficient documentation

## 2018-04-18 DIAGNOSIS — R51 Headache: Secondary | ICD-10-CM | POA: Insufficient documentation

## 2018-04-18 DIAGNOSIS — Z87891 Personal history of nicotine dependence: Secondary | ICD-10-CM | POA: Insufficient documentation

## 2018-04-18 DIAGNOSIS — Z794 Long term (current) use of insulin: Secondary | ICD-10-CM | POA: Insufficient documentation

## 2018-04-18 DIAGNOSIS — Z992 Dependence on renal dialysis: Secondary | ICD-10-CM | POA: Insufficient documentation

## 2018-04-18 DIAGNOSIS — N186 End stage renal disease: Secondary | ICD-10-CM | POA: Insufficient documentation

## 2018-04-18 LAB — DIFFERENTIAL
ABS IMMATURE GRANULOCYTES: 0 10*3/uL (ref 0.0–0.1)
BASOS PCT: 1 %
Basophils Absolute: 0.1 10*3/uL (ref 0.0–0.1)
EOS ABS: 0.4 10*3/uL (ref 0.0–0.7)
EOS PCT: 5 %
Immature Granulocytes: 0 %
Lymphocytes Relative: 25 %
Lymphs Abs: 2 10*3/uL (ref 0.7–4.0)
MONOS PCT: 6 %
Monocytes Absolute: 0.5 10*3/uL (ref 0.1–1.0)
NEUTROS PCT: 63 %
Neutro Abs: 5.1 10*3/uL (ref 1.7–7.7)

## 2018-04-18 LAB — PROTIME-INR
INR: 1.02
PROTHROMBIN TIME: 13.3 s (ref 11.4–15.2)

## 2018-04-18 LAB — COMPREHENSIVE METABOLIC PANEL
ALT: 30 U/L (ref 0–44)
ANION GAP: 14 (ref 5–15)
AST: 20 U/L (ref 15–41)
Albumin: 4.2 g/dL (ref 3.5–5.0)
Alkaline Phosphatase: 85 U/L (ref 38–126)
BUN: 20 mg/dL (ref 6–20)
CHLORIDE: 92 mmol/L — AB (ref 98–111)
CO2: 28 mmol/L (ref 22–32)
Calcium: 8.5 mg/dL — ABNORMAL LOW (ref 8.9–10.3)
Creatinine, Ser: 3.35 mg/dL — ABNORMAL HIGH (ref 0.61–1.24)
GFR calc Af Amer: 23 mL/min — ABNORMAL LOW (ref >60)
GFR, EST NON AFRICAN AMERICAN: 20 mL/min — AB (ref >60)
Glucose, Bld: 229 mg/dL — ABNORMAL HIGH (ref 70–99)
POTASSIUM: 3.1 mmol/L — AB (ref 3.5–5.1)
Sodium: 134 mmol/L — ABNORMAL LOW (ref 135–145)
Total Bilirubin: 0.4 mg/dL (ref 0.3–1.2)
Total Protein: 7.7 g/dL (ref 6.5–8.1)

## 2018-04-18 LAB — CBG MONITORING, ED: GLUCOSE-CAPILLARY: 353 mg/dL — AB (ref 70–99)

## 2018-04-18 LAB — I-STAT CHEM 8, ED
BUN: 23 mg/dL — AB (ref 6–20)
CALCIUM ION: 0.93 mmol/L — AB (ref 1.15–1.40)
Chloride: 92 mmol/L — ABNORMAL LOW (ref 98–111)
Creatinine, Ser: 3.6 mg/dL — ABNORMAL HIGH (ref 0.61–1.24)
Glucose, Bld: 239 mg/dL — ABNORMAL HIGH (ref 70–99)
HEMATOCRIT: 36 % — AB (ref 39.0–52.0)
HEMOGLOBIN: 12.2 g/dL — AB (ref 13.0–17.0)
Potassium: 3.1 mmol/L — ABNORMAL LOW (ref 3.5–5.1)
SODIUM: 135 mmol/L (ref 135–145)
TCO2: 31 mmol/L (ref 22–32)

## 2018-04-18 LAB — CBC
HCT: 34.8 % — ABNORMAL LOW (ref 39.0–52.0)
Hemoglobin: 12.6 g/dL — ABNORMAL LOW (ref 13.0–17.0)
MCH: 30.8 pg (ref 26.0–34.0)
MCHC: 36.2 g/dL — ABNORMAL HIGH (ref 30.0–36.0)
MCV: 85.1 fL (ref 78.0–100.0)
PLATELETS: 170 10*3/uL (ref 150–400)
RBC: 4.09 MIL/uL — ABNORMAL LOW (ref 4.22–5.81)
RDW: 13.1 % (ref 11.5–15.5)
WBC: 8.2 10*3/uL (ref 4.0–10.5)

## 2018-04-18 LAB — APTT: aPTT: 35 seconds (ref 24–36)

## 2018-04-18 LAB — I-STAT TROPONIN, ED: TROPONIN I, POC: 0 ng/mL (ref 0.00–0.08)

## 2018-04-18 MED ORDER — MECLIZINE HCL 25 MG PO TABS
25.0000 mg | ORAL_TABLET | Freq: Once | ORAL | Status: DC
  Filled 2018-04-18: qty 1

## 2018-04-18 NOTE — ED Triage Notes (Signed)
Pt arrived POV for c/o dizziness x 5 days, headache in the back of his head, and feeling generalized weakness. ESRD x 2 years, goes to dialysis Tuesday Thursday Saturday. Last went today unsure if he had full treatment because he began having cramps and he's unsure. EDP at bedside for quick look.

## 2018-04-18 NOTE — ED Provider Notes (Signed)
Patient placed in Quick Look pathway, seen and evaluated   Chief Complaint: dizziness  HPI:  Elvan Ebron is a 52 y.o. male who presents to the ED with c/o dizziness x 5 days, headache in the back of his head, and feeling generalized weakness. ESRD x 2 years, goes to dialysis Tuesday Thursday Saturday. Last went today unsure if he had full treatment because he began having cramps.   ROS: Neuro: headache, dizziness  Physical Exam:  BP 133/81 (BP Location: Left Arm)   Pulse (!) 58   Temp 97.7 F (36.5 C) (Oral)   Resp 17   Ht 5\' 2"  (1.575 m)   Wt 89.8 kg   SpO2 100%   BMI 36.21 kg/m    Gen: No distress  Neuro: Awake and Alert, grips are equal, no pronator drift  Skin: Warm and dry   Initiation of care has begun. The patient has been counseled on the process, plan, and necessity for staying for the completion/evaluation, and the remainder of the medical screening examination    Ashley Murrain, NP 04/18/18 Kristian Covey    Duffy Bruce, MD 04/19/18 0005

## 2018-04-18 NOTE — ED Provider Notes (Addendum)
[facility] EMERGENCY DEPARTMENT Provider Note   CSN: [id number] Arrival date & time: 04/18/18  1757     History   Chief Complaint Chief Complaint  Patient presents with  . Dizziness  . Headache    HPI Eric Glenn is a 52 y.o. male.  HPI 52 year old male with past medical history as below here with dizziness.  History provided with assistance of interpreter.  Patient states that for the last several days, he has had difficulty walking due to generalized dizziness.  He said associated dull, aching, throbbing, occipital headache.  He states that he feels off balance and like the room is occasionally spinning.  The symptoms are worse with movement.  He said some associated tingling in his right leg as well as his left arm with some weakness.  He has never had similar symptoms in the past.  Denies any tinnitus.  He has a history of severe vision loss due to his underlying disease, but denies any change in his vision.  No seizure-like activity.  No history of stroke that he is aware of.  He last dialyzed yesterday, but could not sit for a full session due to dizziness and generalized weakness.  Past Medical History:  Diagnosis Date  . Anemia   . Diabetes mellitus type 2, controlled ([place]) 08/04/2015  . Diabetic retinopathy ([place])   . Diarrhea   . ESRD (end stage renal disease) ([place]) 03/10/2016  . High cholesterol   . Hypertension   . Nausea 08/2017    Patient Active Problem List   Diagnosis Date Noted  . Excessive cerumen in both ear canals 06/22/2017  . Muscle cramps 01/24/2017  . ESRD (end stage renal disease) ([place]) 03/10/2016  . Nasal septal deviation 12/10/2015  . Asthma 10/01/2015  . Hypersomnia 10/01/2015  . Obesity 10/01/2015  . Normocytic anemia 08/28/2015  . Hyperlipidemia 08/19/2015  . Sinusitis, chronic 08/19/2015  . Essential hypertension 08/04/2015  . Uncontrolled type 2 diabetes mellitus with complication ([place]) 38/04/1750  . Diabetic  retinopathy associated with type 2 diabetes mellitus ([place]) 08/04/2015    Past Surgical History:  Procedure Laterality Date  . AV FISTULA PLACEMENT Right 09/04/2015   Procedure: ARTERIOVENOUS (AV) FISTULA CREATION;  Surgeon: Angelia Mould, MD;  Location: [facility];  Service: Vascular;  Laterality: Right;  . AV FISTULA PLACEMENT Right 01/22/2016   Procedure: RIGHT UPPER ARM BRACHIOCEPHALIC ARTERIOVENOUS (AV) FISTULA CREATION;  Surgeon: Angelia Mould, MD;  Location: [facility];  Service: Vascular;  Laterality: Right;  . LIGATION OF ARTERIOVENOUS  FISTULA Right 01/22/2016   Procedure: LIGATION OF RIGHT FOREARM ARTERIOVENOUS  FISTULA;  Surgeon: Angelia Mould, MD;  Location: [facility];  Service: Vascular;  Laterality: Right;  . PERIPHERAL VASCULAR CATHETERIZATION N/A 01/12/2016   Procedure: Fistulagram;  Surgeon: Angelia Mould, MD;  Location: [facility] CV LAB;  Service: Cardiovascular;  Laterality: N/A;  . UMBILICAL HERNIA REPAIR          Home Medications    Prior to Admission medications   Medication Sig Start Date End Date Taking? Authorizing Provider  albuterol (PROVENTIL HFA;VENTOLIN HFA) 108 (90 Base) MCG/ACT inhaler Inhale 2 puffs into the lungs every 6 (six) hours as needed for wheezing or shortness of breath. 12/23/16  Yes Ledell Noss, MD  amLODipine (NORVASC) 10 MG tablet Take 1 tablet (10 mg total) by mouth daily. 03/01/18  Yes Santos-Sanchez, Merlene Morse, MD  atorvastatin (LIPITOR) 40 MG tablet Take 1 tablet (40 mg total) by mouth daily. 03/01/18  Yes  Welford Roche, MD  Blood Glucose Monitoring Suppl (TRUE METRIX METER) DEVI 1 each by Does not apply route 4 (four) times daily. IM Program/[facility]OPE fund 01/10/17  Yes Lorella Nimrod, MD  carvedilol (COREG) 12.5 MG tablet Take 1 tablet (12.5 mg total) by mouth 2 (two) times daily. 03/01/18  Yes Santos-Sanchez, Merlene Morse, MD  fluticasone (FLONASE) 50 MCG/ACT nasal spray Place 1 spray into both nostrils daily. Patient taking  differently: Place 1 spray into both nostrils daily as needed for allergies.  06/22/17 06/22/18 Yes Molt, Bethany, DO  glucose blood (TRUE METRIX BLOOD GLUCOSE TEST) test strip IM program/[facility]OPE fund, Use as instructed 4 times a day. 01/17/18  Yes Santos-Sanchez, Merlene Morse, MD  Insulin Detemir (LEVEMIR) 100 UNIT/ML Pen Inject 20 Units into the skin 2 (two) times daily. IM program, d/c basaglar 11/22/17  Yes Kathi Ludwig, MD  Insulin Pen Needle 32G X 4 MM MISC Use 2 needles a day to inject insulin. IM Program 03/01/18  Yes Welford Roche, MD  Lancets MISC 4 Times a day. IM Program/[facility]. 01/10/17  Yes Lorella Nimrod, MD  multivitamin (RENA-VIT) TABS tablet Take 1 tablet by mouth daily. 07/29/17  Yes [provider]  Omega-3 Fatty Acids (FISH OIL) 1200 MG CAPS Take 1,200 mg by mouth 2 (two) times daily.    Yes [provider]  sitaGLIPtin (JANUVIA) 25 MG tablet Take 1 tablet (25 mg total) by mouth daily. 06/28/17  Yes Welford Roche, MD  diphenhydrAMINE (BENADRYL) 25 MG tablet Take 0.5 tablets (12.5 mg total) by mouth 3 (three) times a week. Patient not taking: Reported on 04/18/2018 01/24/17   Kalman Shan Ratliff, DO  epoetin alfa (EPOGEN,PROCRIT) 73419 UNIT/ML injection Inject 0.5 mLs (5,000 Units total) into the skin every Wednesday. Patient not taking: Reported on 08/22/2017 03/31/16   Kalman Shan Ratliff, DO  hydrALAZINE (APRESOLINE) 100 MG tablet TAKE 1 Tablet BY MOUTH 3 TIMES DAILY Patient not taking: Reported on 04/18/2018 10/04/17   Welford Roche, MD  sodium bicarbonate 650 MG tablet Take 1 tablet (650 mg total) by mouth 3 (three) times daily with meals. Patient not taking: Reported on 04/18/2018 08/23/17   Neva Seat, MD  triamcinolone cream (KENALOG) 0.1 % APPLY TO THE AFFECTED AREA TWICE DAILY Patient not taking: Reported on 04/18/2018 07/15/17   Welford Roche, MD    Family History Family History  Problem Relation Age of Onset  .  Hypertension Mother   . Hyperlipidemia Mother   . Diabetes Mellitus II Sister     Social History Social History   Tobacco Use  . Smoking status: Former Smoker    Last attempt to quit: 10/22/2003    Years since quitting: 14.4  . Smokeless tobacco: Never Used  Substance Use Topics  . Alcohol use: No    Alcohol/week: 0.0 standard drinks  . Drug use: No     Allergies   Poractant alfa and Pork-derived products   Review of Systems Review of Systems  Constitutional: Positive for fatigue. Negative for chills and fever.  HENT: Negative for congestion and rhinorrhea.   Eyes: Negative for visual disturbance.  Respiratory: Negative for cough, shortness of breath and wheezing.   Cardiovascular: Negative for chest pain and leg swelling.  Gastrointestinal: Negative for abdominal pain, diarrhea, nausea and vomiting.  Genitourinary: Negative for dysuria and flank pain.  Musculoskeletal: Negative for neck pain and neck stiffness.  Skin: Negative for rash and wound.  Allergic/Immunologic: Negative for immunocompromised state.  Neurological: Positive for dizziness, weakness and headaches. Negative for syncope.  All other systems reviewed and are negative.    Physical Exam Updated Vital Signs BP (!) 162/91   Pulse (!) 58   Temp (!) 97.5 F (36.4 C) (Oral)   Resp 10   Ht 5\' 2"  (1.575 m)   Wt 89.8 kg   SpO2 99%   BMI 36.21 kg/m   Physical Exam  Constitutional: He is oriented to person, place, and time. He appears well-developed and well-nourished. No distress.  HENT:  Head: Normocephalic and atraumatic.  Eyes: Conjunctivae are normal.  Neck: Neck supple.  Cardiovascular: Normal rate, regular rhythm and normal heart sounds. Exam reveals no friction rub.  No murmur heard. Pulmonary/Chest: Effort normal and breath sounds normal. No respiratory distress. He has no wheezes. He has no rales.  Abdominal: He exhibits no distension.  Musculoskeletal: He exhibits no edema.    Neurological: He is alert and oriented to person, place, and time. He exhibits normal muscle tone.  Skin: Skin is warm. Capillary refill takes less than 2 seconds.  Psychiatric: He has a normal mood and affect.  Nursing note and vitals reviewed.   Neurological Exam:  Mental Status: Alert and oriented to person, place, and time. Attention and concentration normal. Speech clear. Recent memory is intact. Cranial Nerves: Visual fields grossly intact. EOMI and PERRLA. No nystagmus noted. Facial sensation intact at forehead, maxillary cheek, and chin/mandible bilaterally. No facial asymmetry or weakness. Hearing grossly normal. Uvula is midline, and palate elevates symmetrically. Normal SCM and trapezius strength. Tongue midline without fasciculations. Motor: Muscle strength 5/5 in proximal and distal UE and LE bilaterally, though subjectively weaker on L>R with grip strength. No pronator drift. Muscle tone normal. Reflexes: 2+ and symmetrical in all four extremities.  Sensation: Intact to light touch in upper and lower extremities distally bilaterally, though subjectively diminished RLE.  Gait: Deferred Coordination: Unable to assess FTN 2/2 poor vision.  ED Treatments / Results  Labs (all labs ordered are listed, but only abnormal results are displayed) Labs Reviewed  CBC - Abnormal; Notable for the following components:      Result Value   RBC 4.09 (*)    Hemoglobin 12.6 (*)    HCT 34.8 (*)    MCHC 36.2 (*)    All other components within normal limits  COMPREHENSIVE METABOLIC PANEL - Abnormal; Notable for the following components:   Sodium 134 (*)    Potassium 3.1 (*)    Chloride 92 (*)    Glucose, Bld 229 (*)    Creatinine, Ser 3.35 (*)    Calcium 8.5 (*)    GFR calc non Af Amer 20 (*)    GFR calc Af Amer 23 (*)    All other components within normal limits  CBG MONITORING, ED - Abnormal; Notable for the following components:   Glucose-Capillary 353 (*)    All other components  within normal limits  I-STAT CHEM 8, ED - Abnormal; Notable for the following components:   Potassium 3.1 (*)    Chloride 92 (*)    BUN 23 (*)    Creatinine, Ser 3.60 (*)    Glucose, Bld 239 (*)    Calcium, Ion 0.93 (*)    Hemoglobin 12.2 (*)    HCT 36.0 (*)    All other components within normal limits  PROTIME-INR  APTT  DIFFERENTIAL  I-STAT TROPONIN, ED    EKG EKG Interpretation  Date/Time:  Tuesday April 18 2018 22:28:40 EDT Ventricular Rate:  60 PR Interval:    QRS Duration: 101  QT Interval:  440 QTC Calculation: 440 R Axis:   31 Text Interpretation:  Sinus rhythm Borderline repolarization abnormality Borderline ST elevation, lateral leads No significant change since last tracing Confirmed by Duffy Bruce [phone]) on 04/18/2018 11:24:44 PM   Radiology Ct Head Wo Contrast  Result Date: 04/18/2018 CLINICAL DATA:  Headache, dizziness, weakness EXAM: CT HEAD WITHOUT CONTRAST TECHNIQUE: Contiguous axial images were obtained from the base of the skull through the vertex without intravenous contrast. COMPARISON:  CT head dated 08/03/2015 FINDINGS: Brain: No evidence of acute infarction, hemorrhage, hydrocephalus, extra-axial collection or mass lesion/mass effect. Vascular: No hyperdense vessel or unexpected calcification. Skull: Normal. Negative for fracture or focal lesion. Sinuses/Orbits: The visualized paranasal sinuses are essentially clear. The mastoid air cells are unopacified. Other: None. IMPRESSION: Normal head CT. Electronically Signed   By: Julian Hy M.D.   On: 04/18/2018 20:13    Procedures Procedures (including critical care time)  Medications Ordered in ED Medications  meclizine (ANTIVERT) tablet 25 mg (has no administration in time range)     Initial Impression / Assessment and Plan / ED Course  I have reviewed the triage vital signs and the nursing notes.  Pertinent labs & imaging results that were available during my care of the patient were  reviewed by me and considered in my medical decision making (see chart for details).    53 year old male with history of end-stage renal disease here with generalized dizziness and headache.  This could be blood pressure related versus secondary to fluid shifts in the setting of dialysis, versus peripheral vertigo, but given his multiple comorbidities I think is reasonable to obtain a MRI of the brain.  Given his subjective upper and lower extremity symptoms as well with pain just at the base of the neck, will obtain MRI of the spine. No infectious sx, no fever, no photophobia. Lytes acceptable for ESRD.  I believe that if MRI is negative, will treat symptomatically for possible vertigo, and disposition accordingly.  Final Clinical Impressions(s) / ED Diagnoses   Final diagnoses:  Dizziness  ESRD (end stage renal disease) [facility])    ED Discharge Orders    None       Duffy Bruce, MD 04/18/18 2350    Duffy Bruce, MD 04/19/18 [phone]

## 2018-04-19 ENCOUNTER — Emergency Department (HOSPITAL_COMMUNITY): Payer: Self-pay

## 2018-04-19 MED ORDER — DIAZEPAM 5 MG PO TABS
5.0000 mg | ORAL_TABLET | Freq: Once | ORAL | Status: AC
  Administered 2018-04-19: 5 mg via ORAL
  Filled 2018-04-19: qty 1

## 2018-04-19 MED ORDER — DIAZEPAM 5 MG PO TABS: 2.5000 mg | ORAL_TABLET | Freq: Two times a day (BID) | ORAL | 0 refills | Status: DC | PRN

## 2018-04-19 MED ORDER — LORAZEPAM 1 MG PO TABS
1.0000 mg | ORAL_TABLET | Freq: Once | ORAL | Status: DC
  Filled 2018-04-19: qty 1

## 2018-04-19 MED FILL — diazePAM 5 MG TABS: 5 | 5 days supply | Qty: 10 | Fill #0

## 2018-04-19 NOTE — ED Notes (Addendum)
Pt. Back from MRI

## 2018-04-19 NOTE — ED Notes (Signed)
Pt in MRI.

## 2018-04-19 NOTE — ED Notes (Signed)
Pt ambulated with assistance of daughter without difficulty

## 2018-04-19 NOTE — ED Notes (Signed)
Patient verbalizes understanding of discharge instructions. Opportunity for questioning and answers were provided. Armband removed by staff, pt discharged from ED to home via POV  

## 2018-04-19 NOTE — ED Provider Notes (Signed)
Assumed care from Dr. Ellender Hose at midnight.  Please see his note for initial work-up.  Briefly patient is a 52 year old male with a history of diabetes who presents to the emergency department with 5 days of vertiginous symptoms.  Also complaining of headache.  Patient also reported extremity paresthesias.  Plan for MRI of the brain and cervical spine.  Patient given Valium for MRI to treat vertiginous symptoms.  MRI without evidence of CVA.  It did reveal mild to moderated cervical stenosis.  On reassessment patient's vertiginous symptoms now resolved.  He was able to ambulate without significant complication.  Will prescribe short course of Valium for vertiginous symptoms.  Recommend PCP follow-up as needed.  We will also have patient follow-up with neurosurgery for further evaluation of the cervical stenosis.  The patient is safe for discharge with strict return precautions.  Disposition: Discharge  Condition: Good  I have discussed the results, Dx and Tx plan with the patient and family who expressed understanding and agree(s) with the plan. Discharge instructions discussed at great length. The patient and family were given strict return precautions who verbalized understanding of the instructions. No further questions at time of discharge.    ED Discharge Orders         Ordered    diazepam (VALIUM) 5 MG tablet  Every 12 hours PRN     04/19/18 [place] narcotic database reviewed and no active descriptions noted.  Follow Up: Welford Roche, MD  Schedule an appointment as soon as possible for a visit  As needed  Earnie Larsson, MD [facility] [address] [address] [phone]  Schedule an appointment as soon as possible for a visit  for cervical stenosis      Cardama, Grayce Sessions, MD 04/19/18 [phone]

## 2018-04-21 MED FILL — RENA-VITE TABLET: 30 days supply | Qty: 30 | Fill #8

## 2018-04-21 MED FILL — JANUVIA 25 MG TABLET: 25 | 30 days supply | Qty: 30 | Fill #10

## 2018-04-21 MED FILL — LEVEMIR FLEXTOUCH 100 UNITS: 100 | 30 days supply | Qty: 12 | Fill #1

## 2018-05-05 MED FILL — AMLODIPINE BESYLATE 10 MG T: 10 | 30 days supply | Qty: 30 | Fill #2

## 2018-05-05 MED FILL — CARVEDILOL 12.5 MG TABLET: 12.5 | 30 days supply | Qty: 60 | Fill #2

## 2018-05-05 MED FILL — ATORVASTATIN 40 MG TABLET: 40 | 30 days supply | Qty: 30 | Fill #2

## 2018-05-26 MED FILL — LEVEMIR FLEXTOUCH 100 UNITS: 100 | 30 days supply | Qty: 12 | Fill #2

## 2018-05-26 MED FILL — RENA-VITE TABLET: 30 days supply | Qty: 30 | Fill #9

## 2018-05-31 ENCOUNTER — Ambulatory Visit: Payer: Self-pay | Admitting: Internal Medicine

## 2018-05-31 ENCOUNTER — Other Ambulatory Visit: Payer: Self-pay

## 2018-05-31 ENCOUNTER — Encounter: Payer: Self-pay | Admitting: Internal Medicine

## 2018-05-31 VITALS — BP 157/86 | HR 65 | Temp 97.7°F | Ht 64.0 in | Wt 196.9 lb

## 2018-05-31 DIAGNOSIS — H11003 Unspecified pterygium of eye, bilateral: Secondary | ICD-10-CM

## 2018-05-31 DIAGNOSIS — E118 Type 2 diabetes mellitus with unspecified complications: Secondary | ICD-10-CM

## 2018-05-31 DIAGNOSIS — Z794 Long term (current) use of insulin: Secondary | ICD-10-CM

## 2018-05-31 DIAGNOSIS — I10 Essential (primary) hypertension: Secondary | ICD-10-CM

## 2018-05-31 DIAGNOSIS — Z79899 Other long term (current) drug therapy: Secondary | ICD-10-CM

## 2018-05-31 DIAGNOSIS — E1165 Type 2 diabetes mellitus with hyperglycemia: Secondary | ICD-10-CM

## 2018-05-31 DIAGNOSIS — IMO0002 Reserved for concepts with insufficient information to code with codable children: Secondary | ICD-10-CM

## 2018-05-31 LAB — POCT GLYCOSYLATED HEMOGLOBIN (HGB A1C): Hemoglobin A1C: 10.9 % — AB (ref 4.0–5.6)

## 2018-05-31 LAB — GLUCOSE, CAPILLARY: Glucose-Capillary: 408 mg/dL — ABNORMAL HIGH (ref 70–99)

## 2018-05-31 MED ORDER — HYDRALAZINE HCL 100 MG PO TABS: 100.0000 mg | ORAL_TABLET | Freq: Three times a day (TID) | ORAL | 1 refills | Status: DC

## 2018-05-31 MED ORDER — GLIPIZIDE ER 2.5 MG PO TB24: 2.5000 mg | ORAL_TABLET | Freq: Every day | ORAL | 1 refills | Status: DC

## 2018-05-31 MED FILL — glipiZIDE ER 2.5 MG TB24: 2.5 | 30 days supply | Qty: 30 | Fill #0

## 2018-05-31 MED FILL — hydrALAZINE HCL 100 MG TABS: 100 | 30 days supply | Qty: 90 | Fill #0

## 2018-05-31 NOTE — Progress Notes (Signed)
   CC: follow up of diabetes   HPI:  [name] is a 52 y.o. with PMH as listed below who presents for follow up of diabetes. Please see the assessment and plans for the status of the patient chronic medical problems.   Past Medical History:  Diagnosis Date  . Anemia   . Diabetes mellitus type 2, controlled ([place]) 08/04/2015  . Diabetic retinopathy ([place])   . Diarrhea   . ESRD (end stage renal disease) ([place]) 03/10/2016  . High cholesterol   . Hypertension   . Nausea 08/2017   Review of Systems:  Refer to history of present illness and assessment and plans for pertinent review of systems, all others reviewed and negative  Physical Exam:  Vitals:   05/31/18 1511  BP: (!) 157/86  Pulse: 65  Temp: 97.7 F (36.5 C)  TempSrc: Oral  SpO2: 98%  Weight: 196 lb 14.4 oz (89.3 kg)  Height: 5\' 4"  (1.626 m)   General: no acute distress  Eyes: bilateral pterygium Cardiac: regular rate and rhythm, no murmur evident, no peripheral edema  Pulm: lungs clear to auscultation  GI: the abdomen is soft, non tender    Assessment & Plan:   Type II Diabetes  Patient is here for diabetes follow up. His diabetes is chronically uncontrolled. Last hemoglobin A1c was 9.8 three months ago, follow up A1c today is 10.9. He is prescribed levemir 20 units BID and sitagliptin 25 mg daily. About once or twice per week he misses his morning levemir injection about once or twice per week. His niece helps him inject the insulin and has started working and is not always able to help him with the morning medication. He has not had abdominal pain or vomiting, he does have intermittent nausea in the morning. He thinks that the progressive worsening of his blood glucose may be related to his food consumption, his family tries to help by cooking healthy for him but he finds that he craves other foods. He feels that it would helpful to follow up with our dietitian again  - increase levemir to 25 units twice  daily  - continue sitagliptin 25 mg daily for renal dosing  - start glipizide 2.5 mg daily  - return to [place] for follow up   Hypertension  Blood pressure is not at goal today, he attributes this to having run out of his hydralazine.  - refilled hydralazine 100 mg three times daily   - continue amlodipine 10 mg and carvedilol 12.5 BID  See Encounters Tab for problem based charting.  Patient discussed with Dr. Eppie Gibson

## 2018-05-31 NOTE — Patient Instructions (Addendum)
Thank you for coming to the clinic today. It was a pleasure to see you.   For your diabetes please  increase the levemir dose to 25 units twice daily Continue sitagliptin Start taking glipizide 2.5 mg daily for diabetes   Schedule an appointment to see [place] When: 1 month with Dr. Frederico HammanLaurin Coder   For: follow up of your blood pressure   What to bring: all of your medication bottles   Please call the internal medicine center clinic if you have any questions or concerns, we may be able to help and keep you from a long and expensive emergency room wait. Our clinic and after hours phone number is [phone], the best time to call is Monday through Friday 9 am to 4 pm but there is always someone available 24/7 if you have an emergency. If you need medication refills please notify your pharmacy one week in advance and they will send [name] a request.

## 2018-06-01 ENCOUNTER — Encounter: Payer: Self-pay | Admitting: Internal Medicine

## 2018-06-01 NOTE — Assessment & Plan Note (Signed)
Patient is here for diabetes follow up. His diabetes is chronically uncontrolled. Last hemoglobin A1c was 9.8 three months ago, follow up A1c today is 10.9. He is prescribed levemir 20 units BID and sitagliptin 25 mg daily. About once or twice per week he misses his morning levemir injection about once or twice per week. His niece helps him inject the insulin and has started working and is not always able to help him with the morning medication. He has not had abdominal pain or vomiting, he does have intermittent nausea in the morning. He thinks that the progressive worsening of his blood glucose may be related to his food consumption, his family tries to help by cooking healthy for him but he finds that he craves other foods. He feels that it would helpful to follow up with our dietitian again  - increase levemir to 25 units twice daily  - continue sitagliptin 25 mg daily for renal dosing  - start glipizide 2.5 mg daily  - return to [place] for follow up

## 2018-06-01 NOTE — Progress Notes (Signed)
Case discussed with Dr. Blum at the time of the visit. We reviewed the resident's history and exam and pertinent patient test results. I agree with the assessment, diagnosis, and plan of care documented in the resident's note. 

## 2018-06-01 NOTE — Assessment & Plan Note (Signed)
Blood pressure is not at goal today, he attributes this to having run out of his hydralazine.  - refilled hydralazine 100 mg three times daily   - continue amlodipine 10 mg and carvedilol 12.5 BID

## 2018-06-02 ENCOUNTER — Other Ambulatory Visit: Payer: Self-pay | Admitting: Dietician

## 2018-06-02 DIAGNOSIS — E1165 Type 2 diabetes mellitus with hyperglycemia: Secondary | ICD-10-CM

## 2018-06-02 DIAGNOSIS — IMO0002 Reserved for concepts with insufficient information to code with codable children: Secondary | ICD-10-CM

## 2018-06-02 DIAGNOSIS — E118 Type 2 diabetes mellitus with unspecified complications: Principal | ICD-10-CM

## 2018-06-02 NOTE — Progress Notes (Signed)
Referral request

## 2018-06-06 NOTE — Progress Notes (Signed)
Done

## 2018-06-07 ENCOUNTER — Encounter: Payer: Self-pay | Admitting: Internal Medicine

## 2018-06-07 ENCOUNTER — Ambulatory Visit: Payer: Self-pay

## 2018-06-08 MED FILL — CONTOUR NEXT STRIPS: 25 days supply | Qty: 100 | Fill #1

## 2018-06-08 MED FILL — UNIFINE PENTIPS 32GX5/32: 32G X 4 MM | 30 days supply | Qty: 100 | Fill #1

## 2018-06-08 MED FILL — UNIFINE PENTIPS 32GX5/32": 32G X 4 MM | 30 days supply | Qty: 100 | Fill #1

## 2018-06-09 ENCOUNTER — Other Ambulatory Visit: Payer: Self-pay | Admitting: Internal Medicine

## 2018-06-09 DIAGNOSIS — IMO0002 Reserved for concepts with insufficient information to code with codable children: Secondary | ICD-10-CM

## 2018-06-09 DIAGNOSIS — E118 Type 2 diabetes mellitus with unspecified complications: Principal | ICD-10-CM

## 2018-06-09 DIAGNOSIS — E1165 Type 2 diabetes mellitus with hyperglycemia: Secondary | ICD-10-CM

## 2018-06-09 MED FILL — AMLODIPINE BESYLATE 10 MG T: 10 | 30 days supply | Qty: 30 | Fill #3

## 2018-06-09 MED FILL — CARVEDILOL 12.5 MG TABLET: 12.5 | 30 days supply | Qty: 60 | Fill #3

## 2018-06-09 MED FILL — ATORVASTATIN 40 MG TABLET: 40 | 30 days supply | Qty: 30 | Fill #3

## 2018-06-09 NOTE — Telephone Encounter (Signed)
Next appt scheduled 12/13 with PCP.

## 2018-06-11 ENCOUNTER — Encounter: Payer: Self-pay | Admitting: *Deleted

## 2018-06-12 MED FILL — JANUVIA 25 MG TABLET: 25 | 30 days supply | Qty: 30 | Fill #0

## 2018-06-21 ENCOUNTER — Encounter (HOSPITAL_COMMUNITY): Payer: Self-pay | Admitting: Emergency Medicine

## 2018-06-21 ENCOUNTER — Telehealth: Payer: Self-pay | Admitting: *Deleted

## 2018-06-21 ENCOUNTER — Other Ambulatory Visit: Payer: Self-pay

## 2018-06-21 ENCOUNTER — Emergency Department (HOSPITAL_COMMUNITY)
Admission: EM | Admit: 2018-06-21 | Discharge: 2018-06-21 | Disposition: A | Discharge status: Home / Self Care | Payer: Self-pay | Attending: Emergency Medicine | Admitting: Emergency Medicine

## 2018-06-21 ENCOUNTER — Ambulatory Visit (HOSPITAL_COMMUNITY)
Admission: EM | Admit: 2018-06-21 | Discharge: 2018-06-21 | Disposition: A | Discharge status: Home / Self Care | Payer: Self-pay

## 2018-06-21 DIAGNOSIS — K047 Periapical abscess without sinus: Secondary | ICD-10-CM | POA: Insufficient documentation

## 2018-06-21 DIAGNOSIS — Z992 Dependence on renal dialysis: Secondary | ICD-10-CM | POA: Insufficient documentation

## 2018-06-21 DIAGNOSIS — I120 Hypertensive chronic kidney disease with stage 5 chronic kidney disease or end stage renal disease: Secondary | ICD-10-CM | POA: Insufficient documentation

## 2018-06-21 DIAGNOSIS — K0889 Other specified disorders of teeth and supporting structures: Secondary | ICD-10-CM

## 2018-06-21 DIAGNOSIS — R0789 Other chest pain: Secondary | ICD-10-CM | POA: Insufficient documentation

## 2018-06-21 DIAGNOSIS — Z794 Long term (current) use of insulin: Secondary | ICD-10-CM | POA: Insufficient documentation

## 2018-06-21 DIAGNOSIS — E1122 Type 2 diabetes mellitus with diabetic chronic kidney disease: Secondary | ICD-10-CM | POA: Insufficient documentation

## 2018-06-21 DIAGNOSIS — Z87891 Personal history of nicotine dependence: Secondary | ICD-10-CM | POA: Insufficient documentation

## 2018-06-21 DIAGNOSIS — Z79899 Other long term (current) drug therapy: Secondary | ICD-10-CM | POA: Insufficient documentation

## 2018-06-21 DIAGNOSIS — R51 Headache: Secondary | ICD-10-CM | POA: Insufficient documentation

## 2018-06-21 DIAGNOSIS — N186 End stage renal disease: Secondary | ICD-10-CM | POA: Insufficient documentation

## 2018-06-21 MED ORDER — HYDROCODONE-ACETAMINOPHEN 5-325 MG PO TABS: 1.0000 | ORAL_TABLET | ORAL | 0 refills | Status: DC | PRN

## 2018-06-21 MED ORDER — PENICILLIN V POTASSIUM 500 MG PO TABS: 500.0000 mg | ORAL_TABLET | Freq: Three times a day (TID) | ORAL | 0 refills | Status: DC

## 2018-06-21 MED ORDER — OXYCODONE-ACETAMINOPHEN 5-325 MG PO TABS
2.0000 | ORAL_TABLET | Freq: Once | ORAL | Status: AC
  Administered 2018-06-21: 2 via ORAL
  Filled 2018-06-21: qty 2

## 2018-06-21 MED ORDER — PENICILLIN V POTASSIUM 250 MG PO TABS
500.0000 mg | ORAL_TABLET | Freq: Once | ORAL | Status: AC
  Administered 2018-06-21: 500 mg via ORAL
  Filled 2018-06-21: qty 2

## 2018-06-21 MED FILL — LEVEMIR FLEXTOUCH 100 UNITS: 100 | 30 days supply | Qty: 12 | Fill #3

## 2018-06-21 NOTE — ED Triage Notes (Signed)
Pt arrives to ED from urgent care with complaints of a left sided headache yesterday while at dialysis that hasn't gone away. Pt reports his left arm also hurts. Pt placed in position of comfort with bed locked and lowered, call bell in reach.

## 2018-06-21 NOTE — Telephone Encounter (Signed)
Call from [facility] - stated pt is having left sided head,shoulder,arm pain with some shortness of breath. Denies slurred speech/chest pain. Stated started last night; unable to sleep.  Talked to Dr Dareen Piano, attending - suggested pt go to ED to be evaluated for the above symptoms.  Informed Magda Paganini, family member, to take pt to ED. She asked what he can take for the pain - informed the doctor at the ED will eval pt and describe meds as needed. Voiced understanding.

## 2018-06-21 NOTE — ED Notes (Signed)
Patient verbalizes understanding of discharge instructions. Opportunity for questioning and answers were provided. Armband removed by staff, pt discharged from ED.  

## 2018-06-21 NOTE — ED Triage Notes (Signed)
Pt here for CP and SOB, told by his PCP to go to ED. EKG obtained, given to [facility], sent to ER

## 2018-06-21 NOTE — Telephone Encounter (Signed)
I agree. Thank you.

## 2018-06-27 NOTE — ED Provider Notes (Signed)
[place] EMERGENCY DEPARTMENT Provider Note   CSN: [id number] Arrival date & time: 06/21/18  1123     History   Chief Complaint Chief Complaint  Patient presents with  . Migraine    HPI Eric Glenn is a 52 y.o. male.  HPI Patient is a 52 year old male who presents to the emergency department with complaints of mild facial pain and facial swelling with concerns about dental infection.  He states this is now causing the headache.  He also has had mild chest discomfort without significant radiation.  Chest discomfort is located in the left chest without radiation to his neck or jaw.  This discomfort has been present for the past day without letting up.    He is a dialysis patient.  Headache was present while at dialysis yesterday.  Denies weakness of his arms or legs.  No recent head injury.  No shortness of breath.  No unilateral leg swelling.  No history of DVT or pulmonary embolism.  He has not seen a dentist.   Past Medical History:  Diagnosis Date  . Anemia   . Diabetes mellitus type 2, controlled ([place]) 08/04/2015  . Diabetic retinopathy ([place])   . Diarrhea   . ESRD (end stage renal disease) ([place]) 03/10/2016  . High cholesterol   . Hypertension   . Nausea 08/2017    Patient Active Problem List   Diagnosis Date Noted  . Excessive cerumen in both ear canals 06/22/2017  . Muscle cramps 01/24/2017  . ESRD (end stage renal disease) ([place]) 03/10/2016  . Nasal septal deviation 12/10/2015  . Asthma 10/01/2015  . Hypersomnia 10/01/2015  . Obesity 10/01/2015  . Normocytic anemia 08/28/2015  . Hyperlipidemia 08/19/2015  . Sinusitis, chronic 08/19/2015  . Essential hypertension 08/04/2015  . Uncontrolled type 2 diabetes mellitus with complication ([place]) 46/96/2952  . Diabetic retinopathy associated with type 2 diabetes mellitus ([place]) 08/04/2015    Past Surgical History:  Procedure Laterality Date  . AV FISTULA PLACEMENT Right 09/04/2015   Procedure: ARTERIOVENOUS (AV) FISTULA CREATION;  Surgeon: Angelia Mould, MD;  Location: [place];  Service: Vascular;  Laterality: Right;  . AV FISTULA PLACEMENT Right 01/22/2016   Procedure: RIGHT UPPER ARM BRACHIOCEPHALIC ARTERIOVENOUS (AV) FISTULA CREATION;  Surgeon: Angelia Mould, MD;  Location: [place];  Service: Vascular;  Laterality: Right;  . LIGATION OF ARTERIOVENOUS  FISTULA Right 01/22/2016   Procedure: LIGATION OF RIGHT FOREARM ARTERIOVENOUS  FISTULA;  Surgeon: Angelia Mould, MD;  Location: [place];  Service: Vascular;  Laterality: Right;  . PERIPHERAL VASCULAR CATHETERIZATION N/A 01/12/2016   Procedure: Fistulagram;  Surgeon: Angelia Mould, MD;  Location: [place] [facility];  Service: Cardiovascular;  Laterality: N/A;  . UMBILICAL HERNIA REPAIR          Home Medications    Prior to Admission medications   Medication Sig Start Date End Date Taking? Authorizing Provider  amLODipine (NORVASC) 10 MG tablet Take 1 tablet (10 mg total) by mouth daily. 03/01/18  Yes Santos-Sanchez, Merlene Morse, MD  atorvastatin (LIPITOR) 40 MG tablet Take 1 tablet (40 mg total) by mouth daily. 03/01/18  Yes Santos-Sanchez, Merlene Morse, MD  carvedilol (COREG) 12.5 MG tablet Take 1 tablet (12.5 mg total) by mouth 2 (two) times daily. 03/01/18  Yes Santos-Sanchez, Merlene Morse, MD  fluticasone (FLONASE) 50 MCG/ACT nasal spray Place 1 spray into both nostrils daily. Patient taking differently: Place 1 spray into both nostrils daily as needed for allergies.  06/22/17 06/22/18 Yes Molt, Bethany, DO  glipiZIDE (GLUCOTROL XL) 2.5 MG 24 hr tablet Take 1 tablet (2.5 mg total) by mouth daily with breakfast. 05/31/18  Yes Ledell Noss, MD  hydrALAZINE (APRESOLINE) 100 MG tablet Take 1 tablet (100 mg total) by mouth 3 (three) times daily. 05/31/18  Yes Ledell Noss, MD  Insulin Detemir (LEVEMIR) 100 UNIT/ML Pen Inject 20 Units into the skin 2 (two) times daily. IM program, d/c basaglar 11/22/17  Yes Kathi Ludwig, MD   JANUVIA 25 MG tablet TAKE 1 TABLET BY MOUTH DAILY. Patient taking differently: Take 25 mg by mouth daily.  06/12/18  Yes Santos-Sanchez, Merlene Morse, MD  multivitamin (RENA-VIT) TABS tablet Take 1 tablet by mouth daily. 07/29/17  Yes [provider]  Blood Glucose Monitoring Suppl (TRUE METRIX METER) DEVI 1 each by Does not apply route 4 (four) times daily. IM Program/HOPE fund 01/10/17   Lorella Nimrod, MD  glucose blood (TRUE METRIX BLOOD GLUCOSE TEST) test strip IM program/HOPE fund, Use as instructed 4 times a day. 01/17/18   Welford Roche, MD  HYDROcodone-acetaminophen (NORCO/VICODIN) 5-325 MG tablet Take 1 tablet by mouth every 4 (four) hours as needed for moderate pain. 06/21/18   Jola Schmidt, MD  Insulin Pen Needle 32G X 4 MM MISC Use 2 needles a day to inject insulin. IM Program 03/01/18   Welford Roche, MD  Lancets MISC 4 Times a day. IM Program/[facility]. 01/10/17   Lorella Nimrod, MD  penicillin v potassium (VEETID) 500 MG tablet Take 1 tablet (500 mg total) by mouth 3 (three) times daily. 06/21/18   Jola Schmidt, MD    Family History Family History  Problem Relation Age of Onset  . Hypertension Mother   . Hyperlipidemia Mother   . Diabetes Mellitus II Sister     Social History Social History   Tobacco Use  . Smoking status: Former Smoker    Last attempt to quit: 10/22/2003    Years since quitting: 14.6  . Smokeless tobacco: Never Used  Substance Use Topics  . Alcohol use: No    Alcohol/week: 0.0 standard drinks  . Drug use: No     Allergies   Poractant alfa and Pork-derived products   Review of Systems Review of Systems  All other systems reviewed and are negative.    Physical Exam Updated Vital Signs BP (!) 164/82 (BP Location: Left Arm)   Pulse 82   Temp 97.8 F (36.6 C) (Oral)   Resp 14   SpO2 97%   Physical Exam  Constitutional: He is oriented to person, place, and time. He appears well-developed and well-nourished.  HENT:  Head:  Normocephalic.  Right-sided facial swelling with tenderness of his right first lower molar without gingival swelling or fluctuance.  Anterior neck is normal.  Space under his tongue is normal.  Eyes: EOM are normal.  Neck: Normal range of motion.  Cardiovascular: Normal rate, regular rhythm, normal heart sounds and intact distal pulses.  Pulmonary/Chest: Effort normal and breath sounds normal. No respiratory distress.  Abdominal: Soft. He exhibits no distension. There is no tenderness.  Musculoskeletal: Normal range of motion.  Neurological: He is alert and oriented to person, place, and time.  Skin: Skin is warm and dry.  Psychiatric: He has a normal mood and affect. Judgment normal.  Nursing note and vitals reviewed.    ED Treatments / Results  Labs (all labs ordered are listed, but only abnormal results are displayed) Labs Reviewed - No data to display  EKG ECG interpretation   Date: 06/21/2018  Rate:  80  Rhythm: normal sinus rhythm  QRS Axis: normal  Intervals: normal  ST/T Wave abnormalities: Nonspecific ST and T wave changes  Conduction Disutrbances: none  Narrative Interpretation:   Old EKG Reviewed: No significant changes noted     Radiology No results found.  Procedures Procedures (including critical care time)  Medications Ordered in ED Medications  penicillin v potassium (VEETID) tablet 500 mg (500 mg Oral Given 06/21/18 1254)  oxyCODONE-acetaminophen (PERCOCET/ROXICET) 5-325 MG per tablet 2 tablet (2 tablets Oral Given 06/21/18 1254)     Initial Impression / Assessment and Plan / ED Course  I have reviewed the triage vital signs and the nursing notes.  Pertinent labs & imaging results that were available during my care of the patient were reviewed by me and considered in my medical decision making (see chart for details).     Patient is overall well-appearing.  Appears to have a dental infection.  Airway is intact.  Home with penicillin and pain  medication.  Dental follow-up.  In regards to his chest pain doubt PE.  Doubt ACS.  Doubt dissection.  Overall well-appearing.  EKG without concerning signs consistent with his prior EKG.  Patient is encouraged to return to the ER for any new or worsening symptoms  Final Clinical Impressions(s) / ED Diagnoses   Final diagnoses:  Pain, dental    ED Discharge Orders         Ordered    penicillin v potassium (VEETID) 500 MG tablet  3 times daily     06/21/18 1335    HYDROcodone-acetaminophen (NORCO/VICODIN) 5-325 MG tablet  Every 4 hours PRN     06/21/18 [place], Morine Kohlman, MD 06/27/18 [phone]

## 2018-06-30 ENCOUNTER — Encounter: Payer: Self-pay | Admitting: Internal Medicine

## 2018-07-03 ENCOUNTER — Encounter: Payer: Self-pay | Admitting: Dietician

## 2018-07-07 MED FILL — ATORVASTATIN 40 MG TABLET: 40 | 30 days supply | Qty: 30 | Fill #4

## 2018-07-07 MED FILL — hydrALAZINE HCL 100 MG TABS: 100 | 30 days supply | Qty: 90 | Fill #1

## 2018-07-07 MED FILL — glipiZIDE ER 2.5 MG TB24: 2.5 | 30 days supply | Qty: 30 | Fill #1

## 2018-07-07 MED FILL — AMLODIPINE BESYLATE 10 MG T: 10 | 30 days supply | Qty: 30 | Fill #4

## 2018-07-07 MED FILL — CARVEDILOL 12.5 MG TABLET: 12.5 | 30 days supply | Qty: 60 | Fill #4

## 2018-07-07 MED FILL — JANUVIA 25 MG TABLET: 25 | 30 days supply | Qty: 30 | Fill #1

## 2018-07-07 MED FILL — RENA-VITE TABLET: 30 days supply | Qty: 30 | Fill #10

## 2018-07-10 ENCOUNTER — Ambulatory Visit: Payer: Self-pay | Admitting: Dietician

## 2018-07-10 ENCOUNTER — Encounter: Payer: Self-pay | Admitting: Internal Medicine

## 2018-07-17 MED FILL — LEVEMIR FLEXTOUCH 100 UNITS: 100 | 30 days supply | Qty: 12 | Fill #4

## 2018-08-04 ENCOUNTER — Ambulatory Visit: Payer: Self-pay | Admitting: Internal Medicine

## 2018-08-04 ENCOUNTER — Ambulatory Visit: Payer: Self-pay | Admitting: Dietician

## 2018-08-04 ENCOUNTER — Encounter: Payer: Self-pay | Admitting: Internal Medicine

## 2018-08-04 ENCOUNTER — Encounter: Payer: Self-pay | Admitting: Dietician

## 2018-08-04 VITALS — BP 139/74 | HR 67 | Temp 97.9°F | Wt 198.0 lb

## 2018-08-04 DIAGNOSIS — I10 Essential (primary) hypertension: Secondary | ICD-10-CM

## 2018-08-04 DIAGNOSIS — Z992 Dependence on renal dialysis: Secondary | ICD-10-CM

## 2018-08-04 DIAGNOSIS — H548 Legal blindness, as defined in USA: Secondary | ICD-10-CM

## 2018-08-04 DIAGNOSIS — IMO0002 Reserved for concepts with insufficient information to code with codable children: Secondary | ICD-10-CM

## 2018-08-04 DIAGNOSIS — Z79899 Other long term (current) drug therapy: Secondary | ICD-10-CM

## 2018-08-04 DIAGNOSIS — N186 End stage renal disease: Secondary | ICD-10-CM

## 2018-08-04 DIAGNOSIS — E118 Type 2 diabetes mellitus with unspecified complications: Principal | ICD-10-CM

## 2018-08-04 DIAGNOSIS — Z9114 Patient's other noncompliance with medication regimen: Secondary | ICD-10-CM

## 2018-08-04 DIAGNOSIS — Z794 Long term (current) use of insulin: Secondary | ICD-10-CM

## 2018-08-04 DIAGNOSIS — E1122 Type 2 diabetes mellitus with diabetic chronic kidney disease: Secondary | ICD-10-CM

## 2018-08-04 DIAGNOSIS — E1165 Type 2 diabetes mellitus with hyperglycemia: Secondary | ICD-10-CM

## 2018-08-04 DIAGNOSIS — I120 Hypertensive chronic kidney disease with stage 5 chronic kidney disease or end stage renal disease: Secondary | ICD-10-CM

## 2018-08-04 MED ORDER — HYDRALAZINE HCL 100 MG PO TABS: 100.0000 mg | ORAL_TABLET | Freq: Three times a day (TID) | ORAL | 6 refills | Status: DC

## 2018-08-04 MED ORDER — GLIPIZIDE ER 2.5 MG PO TB24: 2.5000 mg | ORAL_TABLET | Freq: Every day | ORAL | 3 refills | Status: DC

## 2018-08-04 MED ORDER — RENA-VITE PO TABS: 1.0000 | ORAL_TABLET | Freq: Every day | ORAL | 5 refills | Status: AC

## 2018-08-04 MED FILL — glipiZIDE ER 2.5 MG TB24: 2.5 | 30 days supply | Qty: 30 | Fill #0

## 2018-08-04 MED FILL — RENA-VITE TABLET: 30 days supply | Qty: 30 | Fill #0

## 2018-08-04 MED FILL — hydrALAZINE HCL 100 MG TABS: 100 | 30 days supply | Qty: 90 | Fill #0

## 2018-08-04 MED FILL — AMLODIPINE BESYLATE 10 MG T: 10 | 30 days supply | Qty: 30 | Fill #5

## 2018-08-04 MED FILL — CARVEDILOL 12.5 MG TABLET: 12.5 | 30 days supply | Qty: 60 | Fill #5

## 2018-08-04 MED FILL — ATORVASTATIN 40 MG TABLET: 40 | 30 days supply | Qty: 30 | Fill #5

## 2018-08-04 MED FILL — JANUVIA 25 MG TABLET: 25 | 30 days supply | Qty: 30 | Fill #2

## 2018-08-04 NOTE — Patient Instructions (Signed)
Eric Glenn,   Continue tomandose sus medicamentos de la presion y de la diabetes como usualmente lo ha [place]. No hicimos ningun cambio hoy. Saque una cita de seguimiento conmigo en 1 mes para checar su diabetes.   Llamenos si tiene alguna pregunta o preocupacoin.   - Dra. Frederico Hamman

## 2018-08-06 ENCOUNTER — Encounter: Payer: Self-pay | Admitting: Internal Medicine

## 2018-08-06 NOTE — Assessment & Plan Note (Addendum)
[name] presents for diabetes follow-up.  He was last seen in [facility] on 05/2018 at which time his A1c went from 9.8->10.9.  At that time his Levemir was increased from 20 to 25 units twice daily and he was started on glipizide 2.5 mg daily.  He was also supposed to follow-up with diabetes educator, but has not done this.  He continues to struggle with medication compliance.  He is blind and depends on his niece to inject him insulin who sometimes is unable to administer medication when she has to go to work early.  He usually misses 1 or 2 doses per week, does report strict compliance with his oral medications.  Unclear why he remains so uncontrolled if he is truly missing only 1-2 doses per week.  May benefit from CGM but is uninsured.  Will message [name] to see if it would be possible to do CGM at the end of the month.  He is not due for an A1c today.  We will continue current regimen and have him follow-up in 1 month with me to recheck A1c.  Refilled glipizide.  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  Will not perform an eye exam today.  He does follow-up with Dr. Valetta Close from [facility] ophthalmology.

## 2018-08-06 NOTE — Progress Notes (Signed)
   CC: Follow up of T2DM, HTN, and ESRD   HPI:  Eric Glenn is a 53 y.o. year-old male with PMH listed below who presents to clinic for follow-up of T2DM, HTN, and ESRD.Marland Kitchen Please see problem based assessment and plan for further details.   Past Medical History:  Diagnosis Date  . Anemia   . Diabetes mellitus type 2, controlled ([place]) 08/04/2015  . Diabetic retinopathy ([place])   . Diarrhea   . ESRD (end stage renal disease) ([place]) 03/10/2016  . High cholesterol   . Hypertension   . Nausea 08/2017   Review of Systems:   Review of Systems  Constitutional: Negative for chills and fever.  Respiratory: Negative for cough and shortness of breath.   Cardiovascular: Negative for chest pain, palpitations, orthopnea and leg swelling.  Gastrointestinal: Negative for abdominal pain, nausea and vomiting.  Musculoskeletal: Positive for myalgias.  Neurological: Negative for dizziness and headaches.    Physical Exam: Vitals:   08/04/18 1457  BP: 139/74  Pulse: 67  Temp: 97.9 F (36.6 C)  TempSrc: Oral  SpO2: 98%  Weight: 198 lb (89.8 kg)    General: Chronically ill-appearing man who appears older than stated age in no acute distress Cardiac: regular rate and rhythm, nl S1/S2, no murmurs, rubs or gallops  Pulm: CTAB, no wheezes or crackles, no increased work of breathing on room air  Ext: warm and well perfused, no peripheral edema, RUE AVF with palpable thrill    Assessment & Plan:   See Encounters Tab for problem based charting.  Patient discussed with Dr. Lynnae January

## 2018-08-06 NOTE — Assessment & Plan Note (Signed)
[name] presents for follow-up of hypertension.  He has been chronically uncontrolled due to medication nonadherence.  He frequently runs out of medication and does not call for refills until his next appointment despite being advised to call clinic with no appointment needed several times.  Today his blood pressure is remarkably under control as he has been taking all of his medicines as prescribed.  We will continue to supply him with 90 tablets supplies as this is improving his compliance.  We will continue his regimen of amlodipine, Coreg, and hydralazine at current doses and continue to encourage compliance.  Refilled hydralazine today.

## 2018-08-06 NOTE — Assessment & Plan Note (Addendum)
[name] is compliant with his hemodialysis on T/R/S.  He does not have a regular nephrologist and is currently in the process of applying for the orange card to be able to establish with one.  He does get periodic lab checks during dialysis.  He appears euvolemic on exam today.  We will check a CBC, renal function panel, vitamin D, and PTH at his next visit in 1 month.  I refilled his Rena-Vite.

## 2018-08-08 NOTE — Progress Notes (Signed)
Internal Medicine Clinic Attending  Case discussed with Dr. Santos-Sanchez at the time of the visit.  We reviewed the resident's history and exam and pertinent patient test results.  I agree with the assessment, diagnosis, and plan of care documented in the resident's note.    

## 2018-08-14 MED FILL — LEVEMIR FLEXTOUCH 100 UNITS: 100 | 30 days supply | Qty: 12 | Fill #5

## 2018-08-16 ENCOUNTER — Ambulatory Visit: Payer: Self-pay

## 2018-08-17 ENCOUNTER — Telehealth: Payer: Self-pay | Admitting: Dietician

## 2018-08-17 MED FILL — UNIFINE PENTIPS 32GX5/32: 32G X 4 MM | 30 days supply | Qty: 100 | Fill #2

## 2018-08-17 MED FILL — UNIFINE PENTIPS 32GX5/32": 32G X 4 MM | 30 days supply | Qty: 100 | Fill #2

## 2018-08-17 NOTE — Telephone Encounter (Signed)
Was able to contact Mr. Prescilla Sours' niece. She states they have cut out the majority of salt from their cooking, and do not consume much sweets. She states that he likes to consume tortillas and breads, gave advice to incorporate these with healthy fats, protein and fibers when he does eat them. She also raised a concern with Mr. Prescilla Sours drinking 2 to 3 cups of black coffee a day, which was not worrisome. She had no other questions, and I reminded her that she is always welcome to call [name] if she ever has nutrition-related question or concern.  Earnestine Leys 08/17/2018 3:07 PM.

## 2018-08-23 ENCOUNTER — Encounter: Payer: Self-pay | Admitting: Internal Medicine

## 2018-08-23 ENCOUNTER — Ambulatory Visit (INDEPENDENT_AMBULATORY_CARE_PROVIDER_SITE_OTHER): Payer: Self-pay | Admitting: Internal Medicine

## 2018-08-23 ENCOUNTER — Other Ambulatory Visit: Payer: Self-pay

## 2018-08-23 DIAGNOSIS — L84 Corns and callosities: Secondary | ICD-10-CM | POA: Insufficient documentation

## 2018-08-23 NOTE — Progress Notes (Signed)
   CC: R 1st toe callus   HPI:  [name] is a 53 y.o. male with PMH listed below who presents to clinic for evaluation of a right toe callus.  Please see problem based assessment and plan for further details.  Past Medical History:  Diagnosis Date  . Anemia   . Diabetes mellitus type 2, controlled ([place]) 08/04/2015  . Diabetic retinopathy ([place])   . Diarrhea   . ESRD (end stage renal disease) ([place]) 03/10/2016  . High cholesterol   . Hypertension   . Nausea 08/2017   Review of Systems:   Review of Systems  Musculoskeletal: Negative for joint pain.  Skin: Negative for itching and rash.    Physical Exam: Vitals:   08/23/18 1441  BP: (!) 148/83  Pulse: 77  Temp: 98.4 F (36.9 C)  TempSrc: Oral  SpO2: 97%  Weight: 197 lb 6.4 oz (89.5 kg)  Height: 5\' 4"  (1.626 m)   General: Well-appearing male in no acute distress Ext: R 1st toe with yellowish callus surrounding nail. There is no wounds, ulcers, erythema or warmth noted. He also have a liner purplish discoloration below nail bed on the same toe. No other abnormalities of R foot. L foot exam was unremarkable. Strong PT and DP pulses bilaterally.   Assessment & Plan:   See Encounters Tab for problem based charting.  Patient discussed with Dr. Lynnae January

## 2018-08-23 NOTE — Assessment & Plan Note (Signed)
Mr. [name] presents for evaluation of a callus in his right first toe.  He is accompanied by his niece as usual who expresses concern after callus changed in color from clear to yellowish.  He denies pain or itching and states the callus does not bother him.  He denies recent changes in shoe wear.  On exam there is a yellow callus surrounding nail of right first toe but no signs of infection.  He does have a linear purplish discoloration below the nailbed of the same toe.  Toe otherwise appears unremarkable.  See pictures in chart.  Family states this discoloration is not new.  I recommended follow-up with podiatry but patient is in the process of applying for the orange card.  He has an appointment with Collier [facility]ina on 08/25/2018 and if process is completed should receive his card within 7 days.  Will await for this to place referral to podiatry.  In the meantime I have recommended him to avoid tight shoes and only wear comfortable shoes.  I also advised him not to manipulate the callus as this can lead to wounds and ulcers.  Return precautions discussed (advised to look for signs of localized or systemic infection).

## 2018-08-23 NOTE — Patient Instructions (Signed)
Eric Glenn,   Tiene una callo en su dedo grande del pie. Si note que el pie se le pone rojo, le sale una herida o ulcera dejenos saber ya que eso requiere tratamiento.   Una vez traiga todos los papeles de la tarjeta naranja debe estar recibiendola en 7 dias. Una vez la tenga, lo podemos referir al [employer] del pie para que le evaluen su dedo.   Llamenos si tiene alguna pregunta o preocupacion.   - Dr. Frederico Hamman

## 2018-08-25 ENCOUNTER — Encounter: Payer: Self-pay | Admitting: Internal Medicine

## 2018-08-25 ENCOUNTER — Ambulatory Visit: Payer: Self-pay

## 2018-08-25 NOTE — Progress Notes (Signed)
Internal Medicine Clinic Attending  Case discussed with Dr. Frederico Hamman at the time of the visit.  We reviewed the resident's history and exam and pertinent patient test results.  I agree with the assessment, diagnosis, and plan of care documented in the resident's note.

## 2018-09-01 ENCOUNTER — Ambulatory Visit: Payer: Self-pay | Admitting: Dietician

## 2018-09-01 ENCOUNTER — Encounter: Payer: Self-pay | Admitting: Internal Medicine

## 2018-09-06 NOTE — Addendum Note (Signed)
Addended by: Hulan Fray on: 09/06/2018 08:29 PM   Modules accepted: Orders

## 2018-09-13 MED FILL — LEVEMIR FLEXTOUCH 100 UNITS: 100 | 30 days supply | Qty: 12 | Fill #6

## 2018-09-14 ENCOUNTER — Other Ambulatory Visit: Payer: Self-pay | Admitting: Internal Medicine

## 2018-09-14 DIAGNOSIS — IMO0002 Reserved for concepts with insufficient information to code with codable children: Secondary | ICD-10-CM

## 2018-09-14 DIAGNOSIS — E118 Type 2 diabetes mellitus with unspecified complications: Principal | ICD-10-CM

## 2018-09-14 DIAGNOSIS — E1165 Type 2 diabetes mellitus with hyperglycemia: Secondary | ICD-10-CM

## 2018-09-14 MED FILL — RENA-VITE TABLET: 30 days supply | Qty: 30 | Fill #1 | Status: TO

## 2018-09-14 MED FILL — AMLODIPINE BESYLATE 10 MG T: 10 | 30 days supply | Qty: 30 | Fill #6 | Status: TO

## 2018-09-14 MED FILL — CARVEDILOL 12.5 MG TABLET: 12.5 | 30 days supply | Qty: 60 | Fill #0

## 2018-09-14 MED FILL — ATORVASTATIN 40 MG TABLET: 40 | 30 days supply | Qty: 30 | Fill #6 | Status: TO

## 2018-09-14 MED FILL — JANUVIA 25 MG TABLET: 25 | 30 days supply | Qty: 30 | Fill #0 | Status: TO

## 2018-09-14 MED FILL — hydrALAZINE HCL 100 MG TABS: 100 | 30 days supply | Qty: 90 | Fill #1 | Status: TO

## 2018-09-14 MED FILL — glipiZIDE ER 2.5 MG TB24: 2.5 | 30 days supply | Qty: 30 | Fill #1 | Status: TO

## 2018-09-20 ENCOUNTER — Other Ambulatory Visit: Payer: Self-pay

## 2018-09-20 ENCOUNTER — Ambulatory Visit: Payer: Self-pay | Admitting: Internal Medicine

## 2018-09-20 VITALS — BP 103/63 | HR 64 | Temp 98.1°F | Ht 64.0 in | Wt 195.7 lb

## 2018-09-20 DIAGNOSIS — R0609 Other forms of dyspnea: Secondary | ICD-10-CM

## 2018-09-20 DIAGNOSIS — Z79899 Other long term (current) drug therapy: Secondary | ICD-10-CM

## 2018-09-20 DIAGNOSIS — I951 Orthostatic hypotension: Secondary | ICD-10-CM

## 2018-09-20 DIAGNOSIS — Z992 Dependence on renal dialysis: Secondary | ICD-10-CM

## 2018-09-20 DIAGNOSIS — R0600 Dyspnea, unspecified: Secondary | ICD-10-CM

## 2018-09-20 LAB — GLUCOSE, CAPILLARY: Glucose-Capillary: 279 mg/dL — ABNORMAL HIGH (ref 70–99)

## 2018-09-20 NOTE — Patient Instructions (Signed)
[name]   Please hold your blood pressure medications until your blood pressure is greater than 130/80.  Hemodialysis:  Please check [name]' blood pressure and tell him to restart his blood pressure medications if his blood pressure is greater than 130/80.  Thank you.

## 2018-09-20 NOTE — Progress Notes (Signed)
   CC: dizziness   HPI:  [name] is a 53 y.o. male with history noted below that presents to the acute care clinic for dizziness upon standing. Please see problem based charting for the status of patient's chronic medical conditions.  Past Medical History:  Diagnosis Date  . Anemia   . Diabetes mellitus type 2, controlled ([place]) 08/04/2015  . Diabetic retinopathy ([place])   . Diarrhea   . ESRD (end stage renal disease) ([place]) 03/10/2016  . High cholesterol   . Hypertension   . Nausea 08/2017    Review of Systems:  Review of Systems  Constitutional: Positive for malaise/fatigue. Negative for chills and fever.  Respiratory: Positive for shortness of breath.   Cardiovascular: Negative for chest pain.  Neurological: Positive for dizziness.     Physical Exam:  Vitals:   09/20/18 1332  BP: 103/63  Pulse: 64  Temp: 98.1 F (36.7 C)  TempSrc: Oral  SpO2: 98%  Weight: 195 lb 11.2 oz (88.8 kg)  Height: 5\' 4"  (1.626 m)   Physical Exam  Constitutional: He is well-developed, well-nourished, and in no distress.  Cardiovascular: Normal rate, regular rhythm and normal heart sounds. Exam reveals no gallop and no friction rub.  No murmur heard. Pulmonary/Chest: Effort normal and breath sounds normal. No respiratory distress. He has no wheezes. He has no rales.  Musculoskeletal:        General: No edema.  Skin: Skin is warm and dry.     Assessment & Plan:   See encounters tab for problem based medical decision making.   Patient discussed with Dr. Lynnae January

## 2018-09-21 DIAGNOSIS — I951 Orthostatic hypotension: Secondary | ICD-10-CM | POA: Insufficient documentation

## 2018-09-21 DIAGNOSIS — R0609 Other forms of dyspnea: Secondary | ICD-10-CM | POA: Insufficient documentation

## 2018-09-21 LAB — BMP8+ANION GAP
ANION GAP: 24 mmol/L — AB (ref 10.0–18.0)
BUN / CREAT RATIO: 7 — AB (ref 9–20)
BUN: 45 mg/dL — ABNORMAL HIGH (ref 6–24)
CO2: 23 mmol/L (ref 20–29)
Calcium: 10.1 mg/dL (ref 8.7–10.2)
Chloride: 88 mmol/L — ABNORMAL LOW (ref 96–106)
Creatinine, Ser: 6.76 mg/dL — ABNORMAL HIGH (ref 0.76–1.27)
GFR calc Af Amer: 10 mL/min/{1.73_m2} — ABNORMAL LOW (ref >59)
GFR calc non Af Amer: 8 mL/min/{1.73_m2} — ABNORMAL LOW (ref >59)
Glucose: 279 mg/dL — ABNORMAL HIGH (ref 65–99)
Potassium: 4.1 mmol/L (ref 3.5–5.2)
SODIUM: 135 mmol/L (ref 134–144)

## 2018-09-21 LAB — CBC
Hematocrit: 35.6 % — ABNORMAL LOW (ref 37.5–51.0)
Hemoglobin: 12.2 g/dL — ABNORMAL LOW (ref 13.0–17.7)
MCH: 31 pg (ref 26.6–33.0)
MCHC: 34.3 g/dL (ref 31.5–35.7)
MCV: 90 fL (ref 79–97)
Platelets: 195 10*3/uL (ref 150–450)
RBC: 3.94 x10E6/uL — AB (ref 4.14–5.80)
RDW: 14.9 % (ref 11.6–15.4)
WBC: 7.2 10*3/uL (ref 3.4–10.8)

## 2018-09-21 NOTE — Progress Notes (Signed)
Internal Medicine Clinic Attending  Case discussed with Dr. Hoffman at the time of the visit.  We reviewed the resident's history and exam and pertinent patient test results.  I agree with the assessment, diagnosis, and plan of care documented in the resident's note.  

## 2018-09-21 NOTE — Assessment & Plan Note (Addendum)
History of present illness: Patient states he has been having dizziness upon standing for the past 2 weeks.  He states he has dialysis Tuesday Thursdays and Saturdays. And last dialysis session was Tuesday.  Tolerating oral intake well.  No recent or current sick symptoms.    Assessment: Orthostatic hypotension Orthostatic vitals were positive on exam.  He is currently taking carvedilol, amlodipine and hydralazine. Asked patient to hold medications until blood pressure recheck at hemodialysis.    Plan -Instructions were placed in AVS for hemodialysis to re-check blood pressure and to tell patient to restart medications once his blood pressure is greater than 130/80. -Follow-up with PCP in one month -cbg

## 2018-09-21 NOTE — Assessment & Plan Note (Signed)
History of present illness: Patient states he has shortness of breath with exertion. He states this has been a chronic issue for him but has recently worsened.    Assessment: Shortness of breath with exertion Patient is currently satting well on room air. Lungs were clear to auscultation bilaterally. Patient does have a history of end-stage renal disease and hemoglobin was 12 in October 2019.  Will recheck to assure this hasn't dropped.  Plan -CBC

## 2018-10-04 ENCOUNTER — Ambulatory Visit: Payer: Self-pay

## 2018-10-04 ENCOUNTER — Ambulatory Visit: Payer: Self-pay | Admitting: Pharmacist

## 2018-10-06 MED FILL — LEVEMIR FLEXTOUCH 100 UNITS: 100 | 30 days supply | Qty: 12 | Fill #7 | Status: TO

## 2018-10-27 ENCOUNTER — Other Ambulatory Visit: Payer: Self-pay | Admitting: Internal Medicine

## 2018-10-27 DIAGNOSIS — I10 Essential (primary) hypertension: Secondary | ICD-10-CM

## 2018-10-27 MED FILL — RENA-VITE TABLET: 30 days supply | Qty: 30 | Fill #0

## 2018-10-27 MED FILL — AMLODIPINE BESYLATE 10 MG T: 10 | 30 days supply | Qty: 30 | Fill #0

## 2018-10-27 MED FILL — JANUVIA 25 MG TABLET: 25 | 30 days supply | Qty: 30 | Fill #0

## 2018-10-27 MED FILL — GLIPIZIDE ER 2.5 MG TB24: 2.5 | 30 days supply | Qty: 30 | Fill #0

## 2018-10-27 MED FILL — hydrALAZINE HCL 100 MG TABS: 100 | 30 days supply | Qty: 90 | Fill #0

## 2018-10-27 MED FILL — ATORVASTATIN 40 MG TABLET: 40 | 30 days supply | Qty: 30 | Fill #0

## 2018-11-03 MED FILL — CARVEDILOL 12.5 MG TABLET: 12.5 | 30 days supply | Qty: 60 | Fill #0

## 2018-11-03 MED FILL — LEVEMIR FLEXTOUCH 100 UNITS: 100 | 30 days supply | Qty: 12 | Fill #0

## 2018-11-27 ENCOUNTER — Other Ambulatory Visit: Payer: Self-pay | Admitting: Internal Medicine

## 2018-11-27 DIAGNOSIS — E1165 Type 2 diabetes mellitus with hyperglycemia: Secondary | ICD-10-CM

## 2018-11-27 DIAGNOSIS — IMO0002 Reserved for concepts with insufficient information to code with codable children: Secondary | ICD-10-CM

## 2018-11-27 MED FILL — CARVEDILOL 12.5 MG TABLET: 12.5 | 30 days supply | Qty: 60 | Fill #0

## 2018-11-27 MED FILL — JANUVIA 25 MG TABLET: 25 | 30 days supply | Qty: 30 | Fill #1

## 2018-11-27 MED FILL — RENA-VITE TABLET: 30 days supply | Qty: 30 | Fill #1

## 2018-11-27 MED FILL — ATORVASTATIN 40 MG TABLET: 40 | 30 days supply | Qty: 30 | Fill #1

## 2018-11-27 MED FILL — GLIPIZIDE ER 2.5 MG TB24: 2.5 | 30 days supply | Qty: 30 | Fill #1

## 2018-11-27 MED FILL — AMLODIPINE BESYLATE 10 MG T: 10 | 30 days supply | Qty: 30 | Fill #1

## 2018-11-30 MED FILL — LEVEMIR FLEXTOUCH 100 UNITS: 100 | 30 days supply | Qty: 15 | Fill #0

## 2018-12-06 ENCOUNTER — Ambulatory Visit: Payer: Self-pay

## 2018-12-06 ENCOUNTER — Other Ambulatory Visit (HOSPITAL_COMMUNITY): Payer: Self-pay | Admitting: Nephrology

## 2018-12-06 DIAGNOSIS — N186 End stage renal disease: Secondary | ICD-10-CM

## 2018-12-07 ENCOUNTER — Other Ambulatory Visit: Payer: Self-pay | Admitting: Physician Assistant

## 2018-12-08 ENCOUNTER — Encounter (HOSPITAL_COMMUNITY): Payer: Self-pay

## 2018-12-08 ENCOUNTER — Ambulatory Visit (HOSPITAL_COMMUNITY)
Admission: RE | Admit: 2018-12-08 | Discharge: 2018-12-08 | Disposition: A | Discharge status: Home / Self Care | Payer: Self-pay | Source: Ambulatory Visit | Attending: Nephrology | Admitting: Nephrology

## 2018-12-08 ENCOUNTER — Other Ambulatory Visit: Payer: Self-pay

## 2018-12-08 DIAGNOSIS — D649 Anemia, unspecified: Secondary | ICD-10-CM | POA: Insufficient documentation

## 2018-12-08 DIAGNOSIS — H547 Unspecified visual loss: Secondary | ICD-10-CM | POA: Insufficient documentation

## 2018-12-08 DIAGNOSIS — N186 End stage renal disease: Secondary | ICD-10-CM | POA: Insufficient documentation

## 2018-12-08 DIAGNOSIS — E7800 Pure hypercholesterolemia, unspecified: Secondary | ICD-10-CM | POA: Insufficient documentation

## 2018-12-08 DIAGNOSIS — E1122 Type 2 diabetes mellitus with diabetic chronic kidney disease: Secondary | ICD-10-CM | POA: Insufficient documentation

## 2018-12-08 DIAGNOSIS — Z87891 Personal history of nicotine dependence: Secondary | ICD-10-CM | POA: Insufficient documentation

## 2018-12-08 DIAGNOSIS — Z992 Dependence on renal dialysis: Secondary | ICD-10-CM | POA: Insufficient documentation

## 2018-12-08 DIAGNOSIS — I120 Hypertensive chronic kidney disease with stage 5 chronic kidney disease or end stage renal disease: Secondary | ICD-10-CM | POA: Insufficient documentation

## 2018-12-08 DIAGNOSIS — E11319 Type 2 diabetes mellitus with unspecified diabetic retinopathy without macular edema: Secondary | ICD-10-CM | POA: Insufficient documentation

## 2018-12-08 DIAGNOSIS — Z833 Family history of diabetes mellitus: Secondary | ICD-10-CM | POA: Insufficient documentation

## 2018-12-08 DIAGNOSIS — Z95820 Peripheral vascular angioplasty status with implants and grafts: Secondary | ICD-10-CM | POA: Insufficient documentation

## 2018-12-08 DIAGNOSIS — Z79899 Other long term (current) drug therapy: Secondary | ICD-10-CM | POA: Insufficient documentation

## 2018-12-08 DIAGNOSIS — Z8249 Family history of ischemic heart disease and other diseases of the circulatory system: Secondary | ICD-10-CM | POA: Insufficient documentation

## 2018-12-08 DIAGNOSIS — Z794 Long term (current) use of insulin: Secondary | ICD-10-CM | POA: Insufficient documentation

## 2018-12-08 HISTORY — PX: IR DIALY SHUNT INTRO NEEDLE/INTRACATH INITIAL W/IMG RIGHT: IMG6115

## 2018-12-08 LAB — CBC
HCT: 30.8 % — ABNORMAL LOW (ref 39.0–52.0)
Hemoglobin: 10.9 g/dL — ABNORMAL LOW (ref 13.0–17.0)
MCH: 30.7 pg (ref 26.0–34.0)
MCHC: 35.4 g/dL (ref 30.0–36.0)
MCV: 86.8 fL (ref 80.0–100.0)
Platelets: 148 10*3/uL — ABNORMAL LOW (ref 150–400)
RBC: 3.55 MIL/uL — ABNORMAL LOW (ref 4.22–5.81)
RDW: 12.3 % (ref 11.5–15.5)
WBC: 8.2 10*3/uL (ref 4.0–10.5)
nRBC: 0 % (ref 0.0–0.2)

## 2018-12-08 LAB — GLUCOSE, CAPILLARY
Glucose-Capillary: 243 mg/dL — ABNORMAL HIGH (ref 70–99)
Glucose-Capillary: 250 mg/dL — ABNORMAL HIGH (ref 70–99)

## 2018-12-08 LAB — BASIC METABOLIC PANEL
Anion gap: 14 (ref 5–15)
BUN: 30 mg/dL — ABNORMAL HIGH (ref 6–20)
CO2: 27 mmol/L (ref 22–32)
Calcium: 8.8 mg/dL — ABNORMAL LOW (ref 8.9–10.3)
Chloride: 94 mmol/L — ABNORMAL LOW (ref 98–111)
Creatinine, Ser: 5.88 mg/dL — ABNORMAL HIGH (ref 0.61–1.24)
GFR calc Af Amer: 12 mL/min — ABNORMAL LOW (ref >60)
GFR calc non Af Amer: 10 mL/min — ABNORMAL LOW (ref >60)
Glucose, Bld: 255 mg/dL — ABNORMAL HIGH (ref 70–99)
Potassium: 3.9 mmol/L (ref 3.5–5.1)
Sodium: 135 mmol/L (ref 135–145)

## 2018-12-08 LAB — PROTIME-INR
INR: 1 (ref 0.8–1.2)
Prothrombin Time: 13 seconds (ref 11.4–15.2)

## 2018-12-08 LAB — APTT: aPTT: 33 seconds (ref 24–36)

## 2018-12-08 MED ORDER — IOHEXOL 300 MG/ML  SOLN: 100.0000 mL | Freq: Once | INTRAMUSCULAR | Status: DC | PRN

## 2018-12-08 MED ORDER — SODIUM CHLORIDE 0.9 % IV SOLN: INTRAVENOUS | Status: DC

## 2018-12-08 MED ORDER — IOHEXOL 300 MG/ML  SOLN
50.0000 mL | Freq: Once | INTRAMUSCULAR | Status: AC | PRN
  Administered 2018-12-08: 66 mL via INTRAVENOUS

## 2018-12-08 NOTE — Procedures (Signed)
Interventional Radiology Procedure:   Indications: ESRD with right arm fistula and poor flows  Procedure: Right upper extremity fistulogram  Findings: Fistula is patent.  No focal stenosis.  Central veins patent  Complications: None     EBL: less than 10 ml  Plan: Discharge to home.  [place] for dialysis.     Cambrea Kirt R. Anselm Pancoast, MD  Pager: [phone]

## 2018-12-08 NOTE — H&P (Signed)
Chief Complaint: Patient was seen in consultation today for right arm dialysis fistula evaluation and possible intervention at the request of [place]  Referring Physician(s): [place]  Supervising Physician: Markus Daft  Patient Status: [facility] - Out-pt  History of Present Illness: Eric Glenn is a 53 y.o. male   ESRD Right upper arm dialysis fistula 2017 Slow flow per notes from Dr Deterding  Last use yesterday Completed dialysis Pt not aware there is a problem with fistula We have all spoken to Niece Eric Glenn She has explained to him along with Interpreter why we are her and what is to be done He is agreeable to proceed  Scheduled for fistulogram and possible intervention   Past Medical History:  Diagnosis Date  . Anemia   . Diabetes mellitus type 2, controlled ([place]) 08/04/2015  . Diabetic retinopathy ([place])   . Diarrhea   . ESRD (end stage renal disease) ([place]) 03/10/2016  . High cholesterol   . Hypertension   . Nausea 08/2017    Past Surgical History:  Procedure Laterality Date  . AV FISTULA PLACEMENT Right 09/04/2015   Procedure: ARTERIOVENOUS (AV) FISTULA CREATION;  Surgeon: Angelia Mould, MD;  Location: [place];  Service: Vascular;  Laterality: Right;  . AV FISTULA PLACEMENT Right 01/22/2016   Procedure: RIGHT UPPER ARM BRACHIOCEPHALIC ARTERIOVENOUS (AV) FISTULA CREATION;  Surgeon: Angelia Mould, MD;  Location: [facility];  Service: Vascular;  Laterality: Right;  . LIGATION OF ARTERIOVENOUS  FISTULA Right 01/22/2016   Procedure: LIGATION OF RIGHT FOREARM ARTERIOVENOUS  FISTULA;  Surgeon: Angelia Mould, MD;  Location: [facility];  Service: Vascular;  Laterality: Right;  . PERIPHERAL VASCULAR CATHETERIZATION N/A 01/12/2016   Procedure: Fistulagram;  Surgeon: Angelia Mould, MD;  Location: [facility];  Service: Cardiovascular;  Laterality: N/A;  . UMBILICAL HERNIA REPAIR      Allergies: Poractant alfa and Pork-derived  products  Medications: Prior to Admission medications   Medication Sig Start Date End Date Taking? Authorizing Provider  amLODipine (NORVASC) 10 MG tablet Take 1 tablet (10 mg total) by mouth daily. 03/01/18  Yes Santos-Sanchez, Merlene Morse, MD  atorvastatin (LIPITOR) 40 MG tablet Take 1 tablet (40 mg total) by mouth daily. 03/01/18  Yes Santos-Sanchez, Merlene Morse, MD  carvedilol (COREG) 12.5 MG tablet TAKE 1 TABLET BY MOUTH 2 TIMES DAILY. 10/30/18  Yes Annia Belt, MD  glipiZIDE (GLUCOTROL XL) 2.5 MG 24 hr tablet Take 1 tablet (2.5 mg total) by mouth daily with breakfast. 08/04/18  Yes Santos-Sanchez, Merlene Morse, MD  hydrALAZINE (APRESOLINE) 100 MG tablet Take 1 tablet (100 mg total) by mouth 3 (three) times daily. 08/04/18  Yes Santos-Sanchez, Merlene Morse, MD  Insulin Detemir (LEVEMIR FLEXTOUCH) 100 UNIT/ML Pen Inject 25 Units into the skin 2 (two) times daily. 11/28/18  Yes Santos-Sanchez, Merlene Morse, MD  multivitamin (RENA-VIT) TABS tablet Take 1 tablet by mouth daily. 08/04/18  Yes Santos-Sanchez, Merlene Morse, MD  penicillin v potassium (VEETID) 500 MG tablet Take 1 tablet (500 mg total) by mouth 3 (three) times daily. 06/21/18  Yes Jola Schmidt, MD  sitaGLIPtin (JANUVIA) 25 MG tablet Take 1 tablet (25 mg total) by mouth daily. 09/14/18  Yes Santos-Sanchez, Merlene Morse, MD  Blood Glucose Monitoring Suppl (TRUE METRIX METER) DEVI 1 each by Does not apply route 4 (four) times daily. IM Program/HOPE fund 01/10/17   Lorella Nimrod, MD  fluticasone (FLONASE) 50 MCG/ACT nasal spray Place 1 spray into both nostrils daily. Patient taking differently: Place 1 spray into both nostrils daily as needed for  allergies.  06/22/17 12/08/18  Molt, Bethany, DO  glucose blood (TRUE METRIX BLOOD GLUCOSE TEST) test strip IM program/HOPE fund, Use as instructed 4 times a day. 01/17/18   Santos-Sanchez, Merlene Morse, MD  Insulin Pen Needle 32G X 4 MM MISC Use 2 needles a day to inject insulin. IM Program 03/01/18   Welford Roche, MD  Lancets MISC 4  Times a day. IM Program/HOPE Fund. 01/10/17   Lorella Nimrod, MD     Family History  Problem Relation Age of Onset  . Hypertension Mother   . Hyperlipidemia Mother   . Diabetes Mellitus II Sister     Social History   Socioeconomic History  . Marital status: Single    Spouse name: Not on file  . Number of children: Not on file  . Years of education: Not on file  . Highest education level: Not on file  Occupational History  . Not on file  Social Needs  . Financial resource strain: Not on file  . Food insecurity:    Worry: Not on file    Inability: Not on file  . Transportation needs:    Medical: Not on file    Non-medical: Not on file  Tobacco Use  . Smoking status: Former Smoker    Last attempt to quit: 10/22/2003    Years since quitting: 15.1  . Smokeless tobacco: Never Used  Substance and Sexual Activity  . Alcohol use: No    Alcohol/week: 0.0 standard drinks  . Drug use: No  . Sexual activity: Not on file  Lifestyle  . Physical activity:    Days per week: Not on file    Minutes per session: Not on file  . Stress: Not on file  Relationships  . Social connections:    Talks on phone: Not on file    Gets together: Not on file    Attends religious service: Not on file    Active member of club or organization: Not on file    Attends meetings of clubs or organizations: Not on file    Relationship status: Not on file  Other Topics Concern  . Not on file  Social History Narrative  . Not on file    Review of Systems: A 12 point ROS discussed and pertinent positives are indicated in the HPI above.  All other systems are negative.  Review of Systems  Constitutional: Negative for activity change, appetite change, fatigue and fever.  Respiratory: Negative for cough and shortness of breath.   Cardiovascular: Negative for chest pain.  Gastrointestinal: Negative for abdominal pain.  Musculoskeletal: Negative for back pain.  Neurological: Negative for weakness.   Psychiatric/Behavioral: Negative for behavioral problems and confusion.    Vital Signs: BP (!) 154/79   Pulse 64   Temp 98.3 F (36.8 C) (Oral)   Resp 16   Ht 5\' 3"  (1.6 m)   Wt 190 lb (86.2 kg)   SpO2 98%   BMI 33.66 kg/m   Physical Exam Vitals signs reviewed.  Eyes:     Comments: Diabetic retinopathy Blind   Cardiovascular:     Rate and Rhythm: Normal rate and regular rhythm.     Heart sounds: Normal heart sounds.  Pulmonary:     Effort: Pulmonary effort is normal.     Breath sounds: Normal breath sounds.  Abdominal:     General: Bowel sounds are normal.  Musculoskeletal: Normal range of motion.     Comments: Right upper arm fistula +pulse +thrill  Skin:  General: Skin is warm and dry.  Neurological:     Mental Status: He is alert and oriented to person, place, and time.  Psychiatric:        Mood and Affect: Mood normal.        Behavior: Behavior normal.        Thought Content: Thought content normal.        Judgment: Judgment normal.     Imaging: No results found.  Labs:  CBC: Recent Labs    12/21/17 2216 04/18/18 1837 04/18/18 1846 09/20/18 1510 12/08/18 1030  WBC 7.3 8.2  --  7.2 8.2  HGB 10.5* 12.6* 12.2* 12.2* 10.9*  HCT 30.5* 34.8* 36.0* 35.6* 30.8*  PLT 135* 170  --  195 148*    COAGS: Recent Labs    04/18/18 1837 12/08/18 1030  INR 1.02 1.0  APTT 35 33    BMP: Recent Labs    12/21/17 2216 04/18/18 1837 04/18/18 1846 09/20/18 1510 12/08/18 1030  NA 137 134* 135 135 135  K 4.0 3.1* 3.1* 4.1 3.9  CL 95* 92* 92* 88* 94*  CO2 31 28  --  23 27  GLUCOSE 382* 229* 239* 279* 255*  BUN 53* 20 23* 45* 30*  CALCIUM 8.9 8.5*  --  10.1 8.8*  CREATININE 6.88* 3.35* 3.60* 6.76* 5.88*  GFRNONAA 8* 20*  --  8* 10*  GFRAA 10* 23*  --  10* 12*    LIVER FUNCTION TESTS: Recent Labs    04/18/18 1837  BILITOT 0.4  AST 20  ALT 30  ALKPHOS 85  PROT 7.7  ALBUMIN 4.2    TUMOR MARKERS: No results for input(s): AFPTM, CEA, CA199,  CHROMGRNA in the last 8760 hours.  Assessment and Plan:  ESRD Slow flow of right upper arm dialysis fistula per MD Scheduled for fistulogram with possible intervention Pt is aware of procedure benefits and risks including but not linited to Infection; bleeding; vessel damage; damage to surrounding structures Agreeable to proceed Consented through Interpreter and Niece Eric Glenn via phone  Thank you for this interesting consult.  I greatly enjoyed meeting [facility] and look forward to participating in their care.  A copy of this report was sent to the requesting provider on this date.  Electronically Signed: Lavonia Drafts, PA-C 12/08/2018, 11:49 AM   I spent a total of  30 Minutes   in face to face in clinical consultation, greater than 50% of which was counseling/coordinating care for right arm dialysis fistula evaluation

## 2018-12-15 ENCOUNTER — Telehealth: Payer: Self-pay | Admitting: Internal Medicine

## 2018-12-15 ENCOUNTER — Other Ambulatory Visit: Payer: Self-pay

## 2018-12-15 ENCOUNTER — Encounter: Payer: Self-pay | Admitting: Internal Medicine

## 2018-12-15 NOTE — Telephone Encounter (Signed)
I called [name] for our scheduled telehealth appointment x2. No answer. I left a message in Spanish asking patient to call back to clinic.   Welford Roche, MD  Internal Medicine PGY-2  P [phone]

## 2019-01-01 ENCOUNTER — Ambulatory Visit: Payer: Self-pay | Admitting: Internal Medicine

## 2019-01-01 ENCOUNTER — Ambulatory Visit: Payer: Self-pay | Admitting: Pharmacist

## 2019-01-01 ENCOUNTER — Encounter: Payer: Self-pay | Admitting: Internal Medicine

## 2019-01-01 ENCOUNTER — Other Ambulatory Visit: Payer: Self-pay

## 2019-01-01 VITALS — BP 154/81 | HR 63 | Temp 97.7°F | Wt 208.7 lb

## 2019-01-01 DIAGNOSIS — I120 Hypertensive chronic kidney disease with stage 5 chronic kidney disease or end stage renal disease: Secondary | ICD-10-CM

## 2019-01-01 DIAGNOSIS — R0600 Dyspnea, unspecified: Secondary | ICD-10-CM

## 2019-01-01 DIAGNOSIS — Z87891 Personal history of nicotine dependence: Secondary | ICD-10-CM

## 2019-01-01 DIAGNOSIS — L97511 Non-pressure chronic ulcer of other part of right foot limited to breakdown of skin: Secondary | ICD-10-CM

## 2019-01-01 DIAGNOSIS — I10 Essential (primary) hypertension: Secondary | ICD-10-CM

## 2019-01-01 DIAGNOSIS — N186 End stage renal disease: Secondary | ICD-10-CM

## 2019-01-01 DIAGNOSIS — Z794 Long term (current) use of insulin: Secondary | ICD-10-CM

## 2019-01-01 DIAGNOSIS — Z79899 Other long term (current) drug therapy: Secondary | ICD-10-CM

## 2019-01-01 DIAGNOSIS — R0609 Other forms of dyspnea: Secondary | ICD-10-CM

## 2019-01-01 DIAGNOSIS — E1165 Type 2 diabetes mellitus with hyperglycemia: Secondary | ICD-10-CM

## 2019-01-01 DIAGNOSIS — E1122 Type 2 diabetes mellitus with diabetic chronic kidney disease: Secondary | ICD-10-CM

## 2019-01-01 DIAGNOSIS — E11621 Type 2 diabetes mellitus with foot ulcer: Secondary | ICD-10-CM

## 2019-01-01 DIAGNOSIS — E11319 Type 2 diabetes mellitus with unspecified diabetic retinopathy without macular edema: Secondary | ICD-10-CM

## 2019-01-01 DIAGNOSIS — IMO0002 Reserved for concepts with insufficient information to code with codable children: Secondary | ICD-10-CM

## 2019-01-01 DIAGNOSIS — H548 Legal blindness, as defined in USA: Secondary | ICD-10-CM

## 2019-01-01 DIAGNOSIS — E118 Type 2 diabetes mellitus with unspecified complications: Secondary | ICD-10-CM

## 2019-01-01 DIAGNOSIS — Z992 Dependence on renal dialysis: Secondary | ICD-10-CM

## 2019-01-01 DIAGNOSIS — L97519 Non-pressure chronic ulcer of other part of right foot with unspecified severity: Secondary | ICD-10-CM

## 2019-01-01 LAB — POCT GLYCOSYLATED HEMOGLOBIN (HGB A1C): Hemoglobin A1C: 9.4 % — AB (ref 4.0–5.6)

## 2019-01-01 LAB — GLUCOSE, CAPILLARY: Glucose-Capillary: 227 mg/dL — ABNORMAL HIGH (ref 70–99)

## 2019-01-01 MED ORDER — CONTOUR NEXT MONITOR W/DEVICE KIT: 1.0000 | PACK | Freq: Once | 0 refills | Status: AC

## 2019-01-01 MED ORDER — CONTOUR NEXT TEST VI STRP: ORAL_STRIP | 11 refills | Status: AC

## 2019-01-01 MED ORDER — LANCETS MISC: 11 refills | Status: AC

## 2019-01-01 NOTE — Progress Notes (Addendum)
Adiel Erney is a 53 y.o. male who was contacted on behalf of [facility] Task Force and came into the clinic for a visit.  Diabetes Assessment  DM meds and BS checks -  "What medications are you taking for diabetes?"  Januvia 25 mg Levemir 25 units BID Glipizide 2.5mg   -  "How often do you check your blood sugars at home?" twice daily - running out of material so couldn't check as often - "What have your blood sugars been?" over 400s - dialysis center checks it - 426, close to 500,  High Blood Pressure Assessment  BP meds & BP checks -  "What medications are you taking for high blood pressure?"  Carvedilol 12.5mg  Hydralazine 100 mg TID - "How often do you check your blood pressure at home?" does not have a cuff - "What have your blood pressure readings been?"   Coping with DM and BP - What else are you doing to help with your DM and BP - Diet? Struggles the most - likes tortialls Exercise? Walks about 1 hour daily  Medication Access Issues  Medication Issues? -  "Are you getting your medicines refilled on time without skipping any doses?" hard during pandemic - was without medication for about 1 week;  - "Are you having any problems getting/taking your meds (cost, timing, transportation)?" no issues - Do you need any meds refilled? Needs all medications refilled - wants sent to [facility] - need to double check because the medication would not be covered during next fill. Levemir is no longer in the program. Very interested in free insulin program. Requested test strips, lancets, and pen needles for insulin.  Conclusion  Close the call - Date of follow-up visit scheduled: financial visit with Nika on 01/22/2019 - "Any other questions or concerns?"   Patient prefers the pen, family dials up the dose and patient injects himself.  Patient would be starting Trulicity and switching to Lantus (which is on the formulary program for the hospital). Pharmacy will be working on applying to  program assistance for patient for insulin and Trulicity.   Patient forgot the bowel movement medication prescribed from dialysis - provider is calling HD center 6/16 and will ask about regimen. Patient sometimes struggles to go to the restroom.  Current medications: - amlodipine 10mg  PO daily - glipizide ER 2.5mg  PO qAM - rena-vite 1 tablet PO daily - Levemir Flextouch 25 units subQ BID - hydralazine 100 mg PO TID - carvedilol 12.5mg  PO BID - atorvastatin 40 mg PO daily - Januvia 25 mg PO daily - Fish oil supplement 1000 mg  Refills sent to [facility] outpatient.

## 2019-01-01 NOTE — Patient Instructions (Addendum)
Eric Glenn,   Para su diabetes:  1- Continue usando Levemir 25 unidades dos veces al dia. EL personal de farmacia esta trabajando para conseguirle esta medicine de gratis. Si la consiguen, la Lucianne Lei a Quarry manager a Engineer, agricultural. [employer] serian las mismas 25 dos veces al dia.   Para su falta de aire, necesita dos pruebas: un ecocardiograma y pruebas funcionales del pulmon. Desafortunadamente es dificil obtener estos examenes en este momento sin aseguranza. Le recomiendo que esperemos al mes que viene cuando reciba la tarjeta naranja. Lo voy a referir al doctor del ojo tambien una vez reciba la tarjeta.   Para su herida,  1- Pongase gaza en la herida y Public librarian.  2- Use crema hidratante para el resto del pie para evitar otras heridas 3- No use zapatos abiertos   Haga cita de seguimiento en 1 seman.   - Dra. Frederico Hamman

## 2019-01-02 MED FILL — CONTOUR NEXT METER: W/DEVICE | 25 days supply | Qty: 1 | Fill #0

## 2019-01-02 MED FILL — MICROLET LANCETS MISC: 25 days supply | Qty: 100 | Fill #0

## 2019-01-02 MED FILL — CONTOUR NEXT STRIPS: 25 days supply | Qty: 100 | Fill #0

## 2019-01-03 ENCOUNTER — Other Ambulatory Visit: Payer: Self-pay | Admitting: Internal Medicine

## 2019-01-03 ENCOUNTER — Encounter: Payer: Self-pay | Admitting: Internal Medicine

## 2019-01-03 DIAGNOSIS — I10 Essential (primary) hypertension: Secondary | ICD-10-CM

## 2019-01-03 DIAGNOSIS — L97519 Non-pressure chronic ulcer of other part of right foot with unspecified severity: Secondary | ICD-10-CM | POA: Insufficient documentation

## 2019-01-03 DIAGNOSIS — E1165 Type 2 diabetes mellitus with hyperglycemia: Secondary | ICD-10-CM

## 2019-01-03 DIAGNOSIS — IMO0002 Reserved for concepts with insufficient information to code with codable children: Secondary | ICD-10-CM

## 2019-01-03 MED FILL — LEVEMIR FLEXTOUCH 100 UNITS: 100 | 30 days supply | Qty: 15 | Fill #0

## 2019-01-03 MED FILL — CARVEDILOL 12.5 MG TABLET: 12.5 | 30 days supply | Qty: 60 | Fill #0

## 2019-01-03 MED FILL — RENA-VITE TABLET: 30 days supply | Qty: 30 | Fill #0

## 2019-01-03 MED FILL — JANUVIA 25 MG TABLET: 25 | 30 days supply | Qty: 30 | Fill #0

## 2019-01-03 MED FILL — glipiZIDE ER 2.5 MG TB24: 2.5 | 30 days supply | Qty: 30 | Fill #0

## 2019-01-03 NOTE — Assessment & Plan Note (Signed)
[name] presents for diabetes follow-up.  On Levemir 25 units twice a day (missing 1-2 doses per week), januvia, and glipizide. CBGs around 220s which he considers normal for him.  A1c is 9.4.  We will add Trulicity today given his blood glucose remains uncontrolled.  Discontinue Januvia. Appreciate pharmacy's help in patient's care.  -Continue Levemir 25 units twice daily and glipizide -Start Trulicity -Stop Januvia

## 2019-01-03 NOTE — Assessment & Plan Note (Signed)
Patient presents complaining of dry feet.  States he develops superficial wounds frequently due to dryness.  On exam he does have a small ulcer between his first and second toes of the right foot (see picture in media tab).  There are no signs of infections present at this time.  Recommended open toed shoes, keeping area dry, and putting a gauze over affected area.  Also recommended moisturizer in both feet twice daily.  We will follow-up in 1 week as he is high risk for complications due to uncontrolled diabetes.

## 2019-01-03 NOTE — Assessment & Plan Note (Signed)
Blood pressure mildly elevated today.  His last dialysis session was 3 days ago and he is due for his next one tomorrow.  I expect his blood pressure to improve after dialysis tomorrow.  We will continue amlodipine, Coreg, and hydralazine at current doses.

## 2019-01-03 NOTE — Assessment & Plan Note (Signed)
Patient complains of dyspnea on exertion for the past 2 to 3 years with associated with a dry cough.  States he has a hard time walking around the house and doing his house chores.  He denies fevers, chills, recent illness, sick contacts, chest pain, palpitations, leg swelling.  He is a former smoker, smoked for 15 years.  Quit 10 years ago.  Denies history of asthma but does have a documented history of it.  States he has not tried anything for shortness of breath but has been prescribed Symbicort and albuterol in the past.  Does not remember using these.  Differential is broad including heart failure, asthma, COPD, valvular disease, etc.  Low suspicion for an infectious etiology given chronicity of symptoms.  Will need echocardiogram and PFTs for further evaluation but currently uninsured.  He has an appointment next month apply for the orange card.  Will wait for next month to order the studies.  Patient advised to go to the ED for further evaluation if his symptoms worsen.

## 2019-01-03 NOTE — Progress Notes (Signed)
   CC: Evaluation of DOE and R foot wound and follow up of T2DM and HTN    HPI:  [name] is a 53 y.o. year-old male with PMH listed below who presents to clinic for evaluation of DOE and R foot wound and follow up of T2DM and HTN . Please see problem based assessment and plan for further details.   Past Medical History:  Diagnosis Date  . Anemia   . Diabetes mellitus type 2, controlled ([place]) 08/04/2015  . Diabetic retinopathy ([place])   . Diarrhea   . ESRD (end stage renal disease) ([place]) 03/10/2016  . High cholesterol   . Hypertension   . Nausea 08/2017   Review of Systems:   Review of Systems  Constitutional: Negative for chills, fever and weight loss.  Respiratory: Positive for cough and shortness of breath.   Cardiovascular: Negative for chest pain and leg swelling.  Gastrointestinal: Negative for abdominal pain, constipation, diarrhea, nausea and vomiting.  Neurological: Negative for dizziness.    Physical Exam:  Vitals:   01/01/19 1600 01/01/19 1604  BP: (!) 154/81 (!) 154/81  Pulse: 63 63  Temp: 97.7 F (36.5 C) 97.7 F (36.5 C)  TempSrc: Oral Oral  SpO2:  99%  Weight: 208 lb 11.2 oz (94.7 kg) 208 lb 11.2 oz (94.7 kg)    General: well-appearing male in no acute distress  Cardiac: regular rate and rhythm, nl S1/S2, no mrg Pulm: CTAB, no wheezes or crackles, no increased work of breathing on room air  Abd: soft, NTND, bowel sounds normoactive  Ext: warm and well perfused, mild peripheral edema  Derm: small ulcer between 1st and 2nd R toes   Assessment & Plan:   See Encounters Tab for problem based charting.  Patient discussed with Dr. Daryll Drown

## 2019-01-03 NOTE — Assessment & Plan Note (Signed)
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  He is usually able to see color and shapes but states his vision has been worsening for the past month or so.  Denies eye pain or feeling of pressure.  No recent eye infections.  He used to follow-up with Dr. Valetta Close but states he has not seen him in a while.  He is asking for an ophthalmology referral.  Advised him he would have to pay out of pocket if I referred him now versus waiting for his orange card next month.  He decided to wait for the orange card.

## 2019-01-04 ENCOUNTER — Other Ambulatory Visit: Payer: Self-pay | Admitting: *Deleted

## 2019-01-04 DIAGNOSIS — E1165 Type 2 diabetes mellitus with hyperglycemia: Secondary | ICD-10-CM

## 2019-01-04 DIAGNOSIS — IMO0002 Reserved for concepts with insufficient information to code with codable children: Secondary | ICD-10-CM

## 2019-01-04 DIAGNOSIS — I10 Essential (primary) hypertension: Secondary | ICD-10-CM

## 2019-01-04 MED ORDER — AMLODIPINE BESYLATE 10 MG PO TABS: 10.0000 mg | ORAL_TABLET | Freq: Every day | ORAL | 1 refills | Status: DC

## 2019-01-04 MED ORDER — ATORVASTATIN CALCIUM 40 MG PO TABS: 40.0000 mg | ORAL_TABLET | Freq: Every day | ORAL | 1 refills | Status: DC

## 2019-01-04 NOTE — Progress Notes (Signed)
Internal Medicine Clinic Attending  Case discussed with Dr. Santos-Sanchez at the time of the visit.  We reviewed the resident's history and exam and pertinent patient test results.  I agree with the assessment, diagnosis, and plan of care documented in the resident's note.    

## 2019-01-04 NOTE — Addendum Note (Signed)
Addended by: Ebbie Latus on: 01/04/2019 03:32 PM   Modules accepted: Orders

## 2019-01-04 NOTE — Telephone Encounter (Signed)
Doris - medicaid acting up again.

## 2019-01-04 NOTE — Telephone Encounter (Signed)
Dr Frederico Hamman filled yesterday

## 2019-01-04 NOTE — Telephone Encounter (Signed)
Fax from [place] [facility] - stated Dr Isac Sarna is not covered under Medicaid. Please re-send new rxs.   Thanks

## 2019-01-05 NOTE — Telephone Encounter (Signed)
Pt does not have medicaid-pharmacy requesting [facility] resend amlodipine and atorvastatin rxs with "IM Program" on them.[name], [name]6/19/202011:18 AM

## 2019-01-05 NOTE — Telephone Encounter (Signed)
I'm confused. Do you need me to resend the prescriptions?

## 2019-01-08 ENCOUNTER — Ambulatory Visit (INDEPENDENT_AMBULATORY_CARE_PROVIDER_SITE_OTHER): Payer: Self-pay | Admitting: Internal Medicine

## 2019-01-08 ENCOUNTER — Encounter: Payer: Self-pay | Admitting: Internal Medicine

## 2019-01-08 ENCOUNTER — Other Ambulatory Visit: Payer: Self-pay

## 2019-01-08 DIAGNOSIS — Z794 Long term (current) use of insulin: Secondary | ICD-10-CM

## 2019-01-08 DIAGNOSIS — L97511 Non-pressure chronic ulcer of other part of right foot limited to breakdown of skin: Secondary | ICD-10-CM

## 2019-01-08 DIAGNOSIS — E11621 Type 2 diabetes mellitus with foot ulcer: Secondary | ICD-10-CM

## 2019-01-08 DIAGNOSIS — N186 End stage renal disease: Secondary | ICD-10-CM

## 2019-01-08 DIAGNOSIS — I120 Hypertensive chronic kidney disease with stage 5 chronic kidney disease or end stage renal disease: Secondary | ICD-10-CM

## 2019-01-08 DIAGNOSIS — E1122 Type 2 diabetes mellitus with diabetic chronic kidney disease: Secondary | ICD-10-CM

## 2019-01-08 DIAGNOSIS — Z992 Dependence on renal dialysis: Secondary | ICD-10-CM

## 2019-01-08 DIAGNOSIS — E1165 Type 2 diabetes mellitus with hyperglycemia: Secondary | ICD-10-CM

## 2019-01-08 DIAGNOSIS — IMO0002 Reserved for concepts with insufficient information to code with codable children: Secondary | ICD-10-CM

## 2019-01-08 MED FILL — AMLODIPINE BESYLATE 10 MG T: 10 | 30 days supply | Qty: 30 | Fill #0

## 2019-01-08 MED FILL — ATORVASTATIN 40 MG TABLET: 40 | 30 days supply | Qty: 30 | Fill #0

## 2019-01-08 NOTE — Assessment & Plan Note (Signed)
Stable to improved. No signs of infection. Some suspicion for overlying fungal infection   - recommended OTC clotrimazole bid for 2 weeks - continue keeping the area clean and dry  - f/u in 2 weeks

## 2019-01-08 NOTE — Patient Instructions (Signed)
It was nice seeing you today. Thank you for choosing [facility] for your Primary Care.   Today we talked about:  1) Foot wound:  - continue keeping it dry  - wear open toed shoes as much as possible  - buy athletes foot cream and use twice a day for two weeks   ---------------------------------------------------------------------------------------------------------------------------  Fue agradable verte hoy. Gracias por elegir [facility] para su atencin primaria.  Hoy hablamos sobre: 1) Herida del pie: - contina mantenindolo seco - use zapatos con punta abierta tanto como sea posible - compre crema para pies de atleta y [place] When: 2 weeks For: foot wound, diabetes  What to bring: all medications   Please contact the clinic if you have any problems, or need to be seen sooner.

## 2019-01-08 NOTE — Assessment & Plan Note (Signed)
Compliant with levemir 25U bid and qd glipizide. Does not have meter but reports CBGs 180-200s. Pharmacy working on getting Trulicity approved. Denies symptomatic hypoglycemia   - continue levemir 25U bid, glipizide qd  - appreciate pharmacy's assistance with trulicity paperwork

## 2019-01-08 NOTE — Progress Notes (Signed)
   CC: right foot wound   HPI:  [name] is a 54 y.o. M with DM2, ESRD on HD, diabetic foot wound, and HTN who presents for f/u of right foot ulcer. He is accompanied by his niece who also serves as Optometrist.   He was seen one week ago and noted to have a small wound on his right foot in between is first two toes. He has been keeping the area dry and using gauze to cover it. He denies pain, discharge, fevers, chills, or difficulty ambulating. He reports a similar wound on the other foot which resolved with athlete's foot cream.   He reports compliance with his diabetes medications - levemir 25U bid, glipizide qd. Pharmacy is working on getting Trulicity approved. Denies symptomatic hypoglycemia and reports CBGs ranging from 180-200s. He did not bring his meter today.   Past Medical History:  Diagnosis Date  . Anemia   . Diabetes mellitus type 2, controlled ([place]) 08/04/2015  . Diabetic retinopathy ([place])   . Diarrhea   . ESRD (end stage renal disease) ([place]) 03/10/2016  . High cholesterol   . Hypertension   . Nausea 08/2017    Physical Exam:  There were no vitals filed for this visit. Gen: well appearing , NAD MSK: right foot with superficial wound in between the first two toes with small skin break and overlying white skin growth, no drainage/tenderness/warmth. Full ROM.      Assessment & Plan:   See Encounters Tab for problem based charting.  Patient discussed with Dr. Angelia Mould

## 2019-01-10 NOTE — Progress Notes (Signed)
Internal Medicine Clinic Attending  Case discussed with Dr. Vogel  at the time of the visit.  We reviewed the resident's history and exam and pertinent patient test results.  I agree with the assessment, diagnosis, and plan of care documented in the resident's note.  

## 2019-01-22 ENCOUNTER — Ambulatory Visit: Payer: Self-pay | Admitting: Pharmacist

## 2019-01-22 ENCOUNTER — Encounter: Payer: Self-pay | Admitting: Internal Medicine

## 2019-01-22 ENCOUNTER — Ambulatory Visit: Payer: Self-pay

## 2019-01-22 ENCOUNTER — Other Ambulatory Visit: Payer: Self-pay

## 2019-01-22 ENCOUNTER — Ambulatory Visit: Payer: Self-pay | Admitting: Internal Medicine

## 2019-01-22 VITALS — BP 159/76 | HR 78 | Temp 98.1°F | Ht 65.0 in | Wt 207.5 lb

## 2019-01-22 DIAGNOSIS — E1165 Type 2 diabetes mellitus with hyperglycemia: Secondary | ICD-10-CM

## 2019-01-22 DIAGNOSIS — E11621 Type 2 diabetes mellitus with foot ulcer: Secondary | ICD-10-CM

## 2019-01-22 DIAGNOSIS — IMO0002 Reserved for concepts with insufficient information to code with codable children: Secondary | ICD-10-CM

## 2019-01-22 DIAGNOSIS — L97511 Non-pressure chronic ulcer of other part of right foot limited to breakdown of skin: Secondary | ICD-10-CM

## 2019-01-22 DIAGNOSIS — Z79899 Other long term (current) drug therapy: Secondary | ICD-10-CM

## 2019-01-22 DIAGNOSIS — L97518 Non-pressure chronic ulcer of other part of right foot with other specified severity: Secondary | ICD-10-CM

## 2019-01-22 DIAGNOSIS — Z794 Long term (current) use of insulin: Secondary | ICD-10-CM

## 2019-01-22 DIAGNOSIS — Z992 Dependence on renal dialysis: Secondary | ICD-10-CM

## 2019-01-22 DIAGNOSIS — I10 Essential (primary) hypertension: Secondary | ICD-10-CM

## 2019-01-22 DIAGNOSIS — R0602 Shortness of breath: Secondary | ICD-10-CM

## 2019-01-22 MED ORDER — HYDRALAZINE HCL 100 MG PO TABS: 100.0000 mg | ORAL_TABLET | Freq: Three times a day (TID) | ORAL | 10 refills | Status: DC

## 2019-01-22 MED ORDER — INSULIN PEN NEEDLE 32G X 4 MM MISC: 12 refills | Status: DC

## 2019-01-22 MED FILL — UNIFINE PENTIPS 32GX5/32: 32G X 4 MM | 30 days supply | Qty: 100 | Fill #0

## 2019-01-22 NOTE — Patient Instructions (Addendum)
Eric Glenn,   Continue usando la pomada para el pie. aAsegurese de [employer] para que no se le resequent.   Haga cita de seguimiento conmigo en 3 meses o anted si lo necesita.   - Dr. Frederico Hamman

## 2019-01-23 ENCOUNTER — Other Ambulatory Visit: Payer: Self-pay | Admitting: *Deleted

## 2019-01-23 DIAGNOSIS — I10 Essential (primary) hypertension: Secondary | ICD-10-CM

## 2019-01-23 NOTE — Telephone Encounter (Signed)
Please resend rx with "IM Program". Thanks

## 2019-01-24 ENCOUNTER — Encounter: Payer: Self-pay | Admitting: Internal Medicine

## 2019-01-24 MED ORDER — HYDRALAZINE HCL 100 MG PO TABS: 100.0000 mg | ORAL_TABLET | Freq: Three times a day (TID) | ORAL | 10 refills | Status: DC

## 2019-01-24 MED FILL — hydrALAZINE HCL 100 MG TABS: 100 | 30 days supply | Qty: 90 | Fill #0

## 2019-01-24 NOTE — Progress Notes (Signed)
   CC: Wound, HTN, and T2DM follow up   HPI:  [name] is a 53 y.o. year-old male with PMH listed below who presents to clinic for HTN, wound, and T2DM follow up. Please see problem based assessment and plan for further details.  Accompanied by niece who is his primary care taker.   Past Medical History:  Diagnosis Date  . Anemia   . Diabetes mellitus type 2, controlled ([place]) 08/04/2015  . Diabetic retinopathy ([place])   . Diarrhea   . ESRD (end stage renal disease) ([place]) 03/10/2016  . High cholesterol   . Hypertension   . Nausea 08/2017   Review of Systems:   Review of Systems  Constitutional: Negative for chills, fever and malaise/fatigue.  Respiratory: Positive for shortness of breath.   Cardiovascular: Negative for chest pain.  Gastrointestinal: Negative for abdominal pain, constipation, diarrhea, nausea and vomiting.  Skin:       R foot wound      Physical Exam:  Vitals:   01/22/19 1537  BP: (!) 159/76  Pulse: 78  Temp: 98.1 F (36.7 C)  TempSrc: Oral  SpO2: 99%  Weight: 207 lb 8 oz (94.1 kg)  Height: 5\' 5"  (1.651 m)   General: well appearing male, in no acute distress  Cardiac: regular rate and rhythm, nl S1/S2, no murmurs, rubs or gallops  Pulm: CTAB, no wheezes or crackles, no increased work of breathing on room air  Skin: stage 1 ulcer at base of R 3rd toe, no signs of infection    Assessment & Plan:   See Encounters Tab for problem based charting.  Patient discussed with Dr. Daryll Drown

## 2019-01-24 NOTE — Assessment & Plan Note (Signed)
Wound healing well but now with new superficial wound at the base of R 3rd toe. No signs of infection noted on exam. His skin is very dry and scale. I recommended using hydrating lotion on bilateral feet to prevent skin breakdown. Also discussed wearing comfortable, not-tight shoes. Will continue to monitor.

## 2019-01-24 NOTE — Progress Notes (Signed)
Patient reports for appointment for DM medication management. Unfortunately, the visit was limited due to patient needing to provide paperwork for medication cost assistance. Patient states he will bring paperwork another time.

## 2019-01-24 NOTE — Assessment & Plan Note (Signed)
Well-controlled when complaint with medications. BP a little elevated today, but he is due for HD tomorrow. Last HD 7/4. Will not adjust BP medications. Hydralazine refilled today.

## 2019-01-24 NOTE — Assessment & Plan Note (Signed)
On Levemir 25 units QD and glipizide. 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 His mealtime insulin was stopped as his niece was not able to give medicine due to her work schedule. We are working on approval for [employer], which is once weekly and easier for her to administer. Unfortunately, he did not bring the requested paperwork to finalize this. Will bring on Thursday. Once he starts Trulicity, we will stop Januvia.

## 2019-01-24 NOTE — Telephone Encounter (Signed)
Sent new prescription with IM program. Thanks!

## 2019-01-26 NOTE — Progress Notes (Signed)
Internal Medicine Clinic Attending  Case discussed with Dr. Isac Sarna soon after the resident saw the patient.  We reviewed the resident's history and exam and pertinent patient test results.  I agree with the assessment, diagnosis, and plan of care documented in the resident's note.

## 2019-02-01 MED FILL — ATORVASTATIN 40 MG TABLET: 40 | 30 days supply | Qty: 30 | Fill #1

## 2019-02-01 MED FILL — CARVEDILOL 12.5 MG TABLET: 12.5 | 30 days supply | Qty: 60 | Fill #1

## 2019-02-01 MED FILL — LEVEMIR FLEXTOUCH 100 UNITS: 100 | 30 days supply | Qty: 15 | Fill #1

## 2019-02-01 MED FILL — glipiZIDE ER 2.5 MG TB24: 2.5 | 30 days supply | Qty: 30 | Fill #1

## 2019-02-01 MED FILL — JANUVIA 25 MG TABLET: 25 | 30 days supply | Qty: 30 | Fill #1

## 2019-02-01 MED FILL — RENA-VITE TABLET: 30 days supply | Qty: 30 | Fill #1

## 2019-02-01 MED FILL — AMLODIPINE BESYLATE 10 MG T: 10 | 30 days supply | Qty: 30 | Fill #1

## 2019-02-02 MED FILL — UNIFINE PENTIPS 32GX5/32: 32G X 4 MM | 30 days supply | Qty: 100 | Fill #0

## 2019-02-02 MED FILL — UNIFINE PENTIPS 32GX5/32": 32G X 4 MM | 30 days supply | Qty: 100 | Fill #0

## 2019-02-05 ENCOUNTER — Other Ambulatory Visit: Payer: Self-pay

## 2019-02-05 ENCOUNTER — Ambulatory Visit: Payer: Self-pay

## 2019-02-05 ENCOUNTER — Ambulatory Visit: Payer: Self-pay | Admitting: Pharmacist

## 2019-02-05 DIAGNOSIS — IMO0002 Reserved for concepts with insufficient information to code with codable children: Secondary | ICD-10-CM

## 2019-02-05 DIAGNOSIS — E1165 Type 2 diabetes mellitus with hyperglycemia: Secondary | ICD-10-CM

## 2019-02-05 NOTE — Progress Notes (Signed)
S: Eric Glenn is a 53 y.o. male reports to clinical pharmacist appointment for help with diabetes medication management.  Allergies  Allergen Reactions  . Poractant Alfa Rash and Other (See Comments)    Raises blood pressure  . Pork-Derived Products Rash and Other (See Comments)    Raises blood pressure    Current Outpatient Medications:  .  amLODipine (NORVASC) 10 MG tablet, Take 1 tablet (10 mg total) by mouth daily., Disp: 90 tablet, Rfl: 1 .  atorvastatin (LIPITOR) 40 MG tablet, Take 1 tablet (40 mg total) by mouth daily., Disp: 90 tablet, Rfl: 1 .  carvedilol (COREG) 12.5 MG tablet, TAKE 1 TABLET BY MOUTH 2 TIMES DAILY., Disp: 60 tablet, Rfl: 0 .  fluticasone (FLONASE) 50 MCG/ACT nasal spray, Place 1 spray into both nostrils daily. (Patient taking differently: Place 1 spray into both nostrils daily as needed for allergies. ), Disp: 16 g, Rfl: 2 .  glipiZIDE (GLUCOTROL XL) 2.5 MG 24 hr tablet, Take 1 tablet (2.5 mg total) by mouth daily with breakfast., Disp: 90 tablet, Rfl: 3 .  glucose blood (CONTOUR NEXT TEST) test strip, Test blood glucose 4 times daily, Disp: 100 each, Rfl: 11 .  hydrALAZINE (APRESOLINE) 100 MG tablet, Take 1 tablet (100 mg total) by mouth 3 (three) times daily., Disp: 90 tablet, Rfl: 10 .  Insulin Detemir (LEVEMIR FLEXTOUCH) 100 UNIT/ML Pen, Inject 25 Units into the skin 2 (two) times daily., Disp: 15 mL, Rfl: 5 .  Insulin Pen Needle 32G X 4 MM MISC, Use 2 needles a day to inject insulin. IM Program, Disp: 100 each, Rfl: 12 .  Lancets MISC, Test blood glucose 4 times daily, Disp: 100 each, Rfl: 11 .  multivitamin (RENA-VIT) TABS tablet, Take 1 tablet by mouth daily., Disp: 60 tablet, Rfl: 5 .  sitaGLIPtin (JANUVIA) 25 MG tablet, Take 1 tablet (25 mg total) by mouth daily., Disp: 30 tablet, Rfl: 6 Past Medical History:  Diagnosis Date  . Anemia   . Diabetes mellitus type 2, controlled ([place]) 08/04/2015  . Diabetic retinopathy ([place])   . Diarrhea   .  ESRD (end stage renal disease) ([place]) 03/10/2016  . High cholesterol   . Hypertension   . Nausea 08/2017   Social History   Socioeconomic History  . Marital status: Single    Spouse name: Not on file  . Number of children: Not on file  . Years of education: Not on file  . Highest education level: Not on file  Occupational History  . Not on file  Social Needs  . Financial resource strain: Not on file  . Food insecurity    Worry: Not on file    Inability: Not on file  . Transportation needs    Medical: Not on file    Non-medical: Not on file  Tobacco Use  . Smoking status: Former Smoker    Quit date: 10/22/2003    Years since quitting: 15.3  . Smokeless tobacco: Never Used  Substance and Sexual Activity  . Alcohol use: No    Alcohol/week: 0.0 standard drinks  . Drug use: No  . Sexual activity: Not on file  Lifestyle  . Physical activity    Days per week: Not on file    Minutes per session: Not on file  . Stress: Not on file  Relationships  . Social Herbalist on phone: Not on file    Gets together: Not on file    Attends religious service: Not on  file    Active member of club or organization: Not on file    Attends meetings of clubs or organizations: Not on file    Relationship status: Not on file  Other Topics Concern  . Not on file  Social History Narrative  . Not on file   Family History  Problem Relation Age of Onset  . Hypertension Mother   . Hyperlipidemia Mother   . Diabetes Mellitus II Sister    O:    Component Value Date/Time   CHOL 93 (L) 02/05/2016 1054   HDL 28 (L) 02/05/2016 1054   LDLDIRECT 94 08/06/2015 0539   LDLCALC 32 02/05/2016 1054   TRIG 166 (H) 02/05/2016 1054   GLUCOSE 255 (H) 12/08/2018 1030   HGBA1C 9.4 (A) 01/01/2019 1616   HGBA1C 7.7 (H) 12/09/2015 1613   NA 135 12/08/2018 1030   NA 135 09/20/2018 1510   K 3.9 12/08/2018 1030   CL 94 (L) 12/08/2018 1030   CO2 27 12/08/2018 1030   BUN 30 (H) 12/08/2018 1030   BUN  45 (H) 09/20/2018 1510   CREATININE 5.88 (H) 12/08/2018 1030   CREATININE 3.41 (H) 10/01/2015 1108   CALCIUM 8.8 (L) 12/08/2018 1030   GFRNONAA 10 (L) 12/08/2018 1030   GFRNONAA 20 (L) 10/01/2015 1108   GFRAA 12 (L) 12/08/2018 1030   GFRAA 23 (L) 10/01/2015 1108   AST 20 04/18/2018 1837   ALT 30 04/18/2018 1837   WBC 8.2 12/08/2018 1030   HGB 10.9 (L) 12/08/2018 1030   HGB 12.2 (L) 09/20/2018 1510   HCT 30.8 (L) 12/08/2018 1030   HCT 35.6 (L) 09/20/2018 1510   PLT 148 (L) 12/08/2018 1030   PLT 195 09/20/2018 1510   TSH 2.270 08/28/2015 1506   Ht Readings from Last 2 Encounters:  01/22/19 5\' 5"  (1.651 m)  01/08/19 5\' 5"  (1.651 m)   Wt Readings from Last 2 Encounters:  01/22/19 207 lb 8 oz (94.1 kg)  01/08/19 204 lb 1.6 oz (92.6 kg)   There is no height or weight on file to calculate BMI. BP Readings from Last 3 Encounters:  01/22/19 (!) 159/76  01/08/19 (!) 158/75  01/01/19 (!) 154/81   A/P:  Patient reports with daughter, who assists at home in his care. They report patient's home BG average around 130s, did not bring meter to clinic today.  Patient currently takes dulaglutide 0.75 mg weekly and insulin degludec 25 units twice daily. No therapy changes are recommended at this time.  Unable to afford therapy, applied for [employer] Patient Assistance Program for access to [employer]. Medication Samples have been provided to the patient.  Drug name: insulin degludec Tyler Aas)      Strength: 100 units/mL        Qty: 1  LOT: [id number]  Exp.Date: 05/2020  Dosing instructions: inject 25 units into skin twice daily  Drug name: semaglutide (Ozempic)       Strength: 2 mg/ 1.5 mL        Qty: 1  LOT: [id number]  Exp.Date: 04/2021  Dosing instructions: Inject 0.25 mg into skin once weekly   The patient has been instructed regarding the correct time, dose, and frequency of taking this medication, including desired effects and most common side effects.    An after visit summary  was provided and patient advised to follow up if any changes in condition or questions regarding medications arise. Will contact patient via phone to follow up in 1-2 weeks. The patient and  family verbalized understanding of information provided by repeating back concepts discussed.

## 2019-02-13 ENCOUNTER — Telehealth: Payer: Self-pay | Admitting: Internal Medicine

## 2019-02-13 ENCOUNTER — Other Ambulatory Visit: Payer: Self-pay | Admitting: Internal Medicine

## 2019-02-13 DIAGNOSIS — L97511 Non-pressure chronic ulcer of other part of right foot limited to breakdown of skin: Secondary | ICD-10-CM

## 2019-02-13 DIAGNOSIS — R0609 Other forms of dyspnea: Secondary | ICD-10-CM

## 2019-02-13 DIAGNOSIS — E11319 Type 2 diabetes mellitus with unspecified diabetic retinopathy without macular edema: Secondary | ICD-10-CM

## 2019-02-13 NOTE — Telephone Encounter (Signed)
Great! I will review his chart and place orders. Thanks!

## 2019-02-13 NOTE — Telephone Encounter (Signed)
Placed ophthalmology referral and echocardiogram for evaluation of shortness of breath on exertion. Could you let his niece know? Thanks!

## 2019-02-13 NOTE — Telephone Encounter (Signed)
Pt sig other calls to say he has his ORANGE CARD now and dr Isac Sarna can do his referrals for specialists

## 2019-02-13 NOTE — Telephone Encounter (Signed)
Pls contact pt [phone]

## 2019-02-14 NOTE — Telephone Encounter (Signed)
Informed niece that it may take a week or 2 but someone would be in touch, she is agreeable

## 2019-02-20 ENCOUNTER — Telehealth: Payer: Self-pay | Admitting: Pharmacist

## 2019-02-20 DIAGNOSIS — E1165 Type 2 diabetes mellitus with hyperglycemia: Secondary | ICD-10-CM

## 2019-02-20 DIAGNOSIS — IMO0002 Reserved for concepts with insufficient information to code with codable children: Secondary | ICD-10-CM

## 2019-02-20 NOTE — Telephone Encounter (Signed)
Thank you :)

## 2019-02-20 NOTE — Telephone Encounter (Addendum)
Patient was approved for free access to Trulicity for 1 year. Eric Glenn) will reach out to patient to coordinate shipping. Patient will also qualify for Basaglar, will send prescription. Will follow up with patient and notify PCP.

## 2019-02-22 ENCOUNTER — Other Ambulatory Visit (HOSPITAL_COMMUNITY): Payer: Self-pay

## 2019-02-22 MED ORDER — BASAGLAR KWIKPEN 100 UNIT/ML ~~LOC~~ SOPN: 25.0000 [IU] | PEN_INJECTOR | Freq: Two times a day (BID) | SUBCUTANEOUS | 2 refills | Status: DC

## 2019-02-22 NOTE — Addendum Note (Signed)
Addended by: Forde Dandy on: 02/22/2019 01:54 PM   Modules accepted: Orders

## 2019-02-22 NOTE — Addendum Note (Signed)
Addended by: Forde Dandy on: 02/22/2019 01:51 PM   Modules accepted: Orders

## 2019-02-23 ENCOUNTER — Other Ambulatory Visit: Payer: Self-pay

## 2019-02-23 ENCOUNTER — Ambulatory Visit (HOSPITAL_COMMUNITY)
Admission: RE | Admit: 2019-02-23 | Discharge: 2019-02-23 | Disposition: A | Discharge status: Home / Self Care | Payer: Self-pay | Source: Ambulatory Visit | Attending: Internal Medicine | Admitting: Internal Medicine

## 2019-02-23 DIAGNOSIS — R0609 Other forms of dyspnea: Secondary | ICD-10-CM | POA: Insufficient documentation

## 2019-02-23 DIAGNOSIS — I351 Nonrheumatic aortic (valve) insufficiency: Secondary | ICD-10-CM | POA: Insufficient documentation

## 2019-02-23 NOTE — Progress Notes (Signed)
  Echocardiogram 2D Echocardiogram has been performed.  Eric Glenn 02/23/2019, 2:03 PM

## 2019-02-26 ENCOUNTER — Other Ambulatory Visit: Payer: Self-pay | Admitting: *Deleted

## 2019-02-26 DIAGNOSIS — IMO0002 Reserved for concepts with insufficient information to code with codable children: Secondary | ICD-10-CM

## 2019-02-26 DIAGNOSIS — E1165 Type 2 diabetes mellitus with hyperglycemia: Secondary | ICD-10-CM

## 2019-02-26 MED ORDER — BASAGLAR KWIKPEN 100 UNIT/ML ~~LOC~~ SOPN: 25.0000 [IU] | PEN_INJECTOR | Freq: Two times a day (BID) | SUBCUTANEOUS | 6 refills | Status: DC

## 2019-02-26 NOTE — Telephone Encounter (Signed)
Fax from [facility] wanting to know if Basaglar rx should include "IM Program", if so, please re-send rx. Thanks

## 2019-03-05 ENCOUNTER — Encounter: Payer: Self-pay | Admitting: Pharmacist

## 2019-03-05 NOTE — Progress Notes (Signed)
Patient was approved for free medication through [facility] patient assistance program. Prescriptions sent for Trulicity and Engineer, agricultural.

## 2019-03-06 ENCOUNTER — Other Ambulatory Visit: Payer: Self-pay | Admitting: Internal Medicine

## 2019-03-06 DIAGNOSIS — I10 Essential (primary) hypertension: Secondary | ICD-10-CM

## 2019-03-06 MED FILL — LEVEMIR FLEXTOUCH 100 UNITS: 100 | 30 days supply | Qty: 15 | Fill #2

## 2019-03-06 MED FILL — RENA-VITE TABLET: 30 days supply | Qty: 30 | Fill #2

## 2019-03-06 MED FILL — ATORVASTATIN 40 MG TABLET: 40 | 30 days supply | Qty: 30 | Fill #2

## 2019-03-06 MED FILL — glipiZIDE ER 2.5 MG TB24: 2.5 | 30 days supply | Qty: 30 | Fill #2

## 2019-03-06 MED FILL — AMLODIPINE BESYLATE 10 MG T: 10 | 30 days supply | Qty: 30 | Fill #2

## 2019-03-06 MED FILL — JANUVIA 25 MG TABLET: 25 | 30 days supply | Qty: 30 | Fill #2

## 2019-03-06 NOTE — Progress Notes (Signed)
Thank you for all your help Dr. Maudie Mercury!

## 2019-03-07 MED FILL — CARVEDILOL 12.5 MG TABLET: 12.5 | 30 days supply | Qty: 60 | Fill #0

## 2019-04-04 MED FILL — CARVEDILOL 12.5 MG TABLET: 12.5 | 30 days supply | Qty: 60 | Fill #0

## 2019-04-05 MED FILL — AMLODIPINE BESYLATE 10 MG T: 10 | 30 days supply | Qty: 30 | Fill #3

## 2019-04-05 MED FILL — GLIPIZIDE ER 2.5 MG TB24: 2.5 | 30 days supply | Qty: 30 | Fill #3

## 2019-04-05 MED FILL — JANUVIA 25 MG TABLET: 25 | 30 days supply | Qty: 30 | Fill #3

## 2019-04-05 MED FILL — ATORVASTATIN 40 MG TABLET: 40 | 30 days supply | Qty: 30 | Fill #3

## 2019-04-05 MED FILL — RENA-VITE TABLET: 30 days supply | Qty: 30 | Fill #3

## 2019-05-04 ENCOUNTER — Other Ambulatory Visit: Payer: Self-pay | Admitting: Internal Medicine

## 2019-05-04 DIAGNOSIS — E1165 Type 2 diabetes mellitus with hyperglycemia: Secondary | ICD-10-CM

## 2019-05-04 DIAGNOSIS — IMO0002 Reserved for concepts with insufficient information to code with codable children: Secondary | ICD-10-CM

## 2019-05-04 MED FILL — RENA-VITE TABLET: 30 days supply | Qty: 30 | Fill #4

## 2019-05-04 MED FILL — glipiZIDE ER 2.5 MG TB24: 2.5 | 30 days supply | Qty: 30 | Fill #4

## 2019-05-04 MED FILL — ATORVASTATIN 40 MG TABLET: 40 | 30 days supply | Qty: 30 | Fill #4

## 2019-05-04 MED FILL — CARVEDILOL 12.5 MG TABLET: 12.5 | 30 days supply | Qty: 60 | Fill #1

## 2019-05-04 MED FILL — AMLODIPINE BESYLATE 10 MG T: 10 | 30 days supply | Qty: 30 | Fill #4

## 2019-05-09 ENCOUNTER — Other Ambulatory Visit: Payer: Self-pay | Admitting: Internal Medicine

## 2019-05-09 DIAGNOSIS — IMO0002 Reserved for concepts with insufficient information to code with codable children: Secondary | ICD-10-CM

## 2019-05-09 DIAGNOSIS — E1165 Type 2 diabetes mellitus with hyperglycemia: Secondary | ICD-10-CM

## 2019-05-28 ENCOUNTER — Ambulatory Visit: Payer: Self-pay | Admitting: Internal Medicine

## 2019-05-28 ENCOUNTER — Other Ambulatory Visit: Payer: Self-pay

## 2019-05-28 VITALS — BP 146/98 | HR 64 | Temp 98.0°F | Ht 65.5 in | Wt 207.8 lb

## 2019-05-28 DIAGNOSIS — K5904 Chronic idiopathic constipation: Secondary | ICD-10-CM

## 2019-05-28 DIAGNOSIS — E1122 Type 2 diabetes mellitus with diabetic chronic kidney disease: Secondary | ICD-10-CM

## 2019-05-28 DIAGNOSIS — N186 End stage renal disease: Secondary | ICD-10-CM

## 2019-05-28 DIAGNOSIS — R002 Palpitations: Secondary | ICD-10-CM

## 2019-05-28 DIAGNOSIS — IMO0002 Reserved for concepts with insufficient information to code with codable children: Secondary | ICD-10-CM

## 2019-05-28 DIAGNOSIS — I120 Hypertensive chronic kidney disease with stage 5 chronic kidney disease or end stage renal disease: Secondary | ICD-10-CM

## 2019-05-28 DIAGNOSIS — R61 Generalized hyperhidrosis: Secondary | ICD-10-CM

## 2019-05-28 DIAGNOSIS — E118 Type 2 diabetes mellitus with unspecified complications: Secondary | ICD-10-CM

## 2019-05-28 DIAGNOSIS — Z79899 Other long term (current) drug therapy: Secondary | ICD-10-CM

## 2019-05-28 DIAGNOSIS — E11319 Type 2 diabetes mellitus with unspecified diabetic retinopathy without macular edema: Secondary | ICD-10-CM

## 2019-05-28 DIAGNOSIS — H547 Unspecified visual loss: Secondary | ICD-10-CM

## 2019-05-28 DIAGNOSIS — R0602 Shortness of breath: Secondary | ICD-10-CM

## 2019-05-28 DIAGNOSIS — R6883 Chills (without fever): Secondary | ICD-10-CM

## 2019-05-28 DIAGNOSIS — I10 Essential (primary) hypertension: Secondary | ICD-10-CM

## 2019-05-28 DIAGNOSIS — E1165 Type 2 diabetes mellitus with hyperglycemia: Secondary | ICD-10-CM

## 2019-05-28 DIAGNOSIS — E785 Hyperlipidemia, unspecified: Secondary | ICD-10-CM

## 2019-05-28 DIAGNOSIS — Z794 Long term (current) use of insulin: Secondary | ICD-10-CM

## 2019-05-28 DIAGNOSIS — Z992 Dependence on renal dialysis: Secondary | ICD-10-CM

## 2019-05-28 LAB — POCT GLYCOSYLATED HEMOGLOBIN (HGB A1C): Hemoglobin A1C: 7.4 % — AB (ref 4.0–5.6)

## 2019-05-28 LAB — GLUCOSE, CAPILLARY: Glucose-Capillary: 257 mg/dL — ABNORMAL HIGH (ref 70–99)

## 2019-05-28 MED ORDER — BASAGLAR KWIKPEN 100 UNIT/ML ~~LOC~~ SOPN: 25.0000 [IU] | PEN_INJECTOR | Freq: Two times a day (BID) | SUBCUTANEOUS | 6 refills | Status: DC

## 2019-05-28 MED ORDER — ATORVASTATIN CALCIUM 40 MG PO TABS: 40.0000 mg | ORAL_TABLET | Freq: Every day | ORAL | 3 refills | Status: DC

## 2019-05-28 MED ORDER — SENNOSIDES-DOCUSATE SODIUM 8.6-50 MG PO TABS: 1.0000 | ORAL_TABLET | Freq: Every day | ORAL | 0 refills | Status: AC

## 2019-05-28 MED ORDER — HYDRALAZINE HCL 100 MG PO TABS: 100.0000 mg | ORAL_TABLET | Freq: Three times a day (TID) | ORAL | 10 refills | Status: DC

## 2019-05-28 MED ORDER — TRULICITY 1.5 MG/0.5ML ~~LOC~~ SOAJ: 1.5000 mg | SUBCUTANEOUS | 3 refills | Status: DC

## 2019-05-28 MED ORDER — AMLODIPINE BESYLATE 10 MG PO TABS: 10.0000 mg | ORAL_TABLET | Freq: Every day | ORAL | 3 refills | Status: DC

## 2019-05-28 MED ORDER — CARVEDILOL 12.5 MG PO TABS: 12.5000 mg | ORAL_TABLET | Freq: Two times a day (BID) | ORAL | 3 refills | Status: DC

## 2019-05-28 MED FILL — ATORVASTATIN 40 MG TABLET: 40 | 30 days supply | Qty: 30 | Fill #0

## 2019-05-28 MED FILL — AMLODIPINE BESYLATE 10 MG T: 10 | 30 days supply | Qty: 30 | Fill #0

## 2019-05-28 MED FILL — hydrALAZINE HCL 100 MG TABS: 100 | 30 days supply | Qty: 90 | Fill #0

## 2019-05-28 MED FILL — CARVEDILOL 12.5 MG TABLET: 12.5 | 30 days supply | Qty: 60 | Fill #0

## 2019-05-28 NOTE — Progress Notes (Signed)
CC: Cold sweats  HPI: Mr.Dearis [name] is a 53 y.o. 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 Mr.Hernandez is accompanied by his niece who is translating for him. He mentions that since his last visit he has been having lot of complaints regarding his dialysis session. He states that since they moved his dialysis time from afternoon to morning, he has been working with a different dialysis nurse who he feels does not listen to his complaints. He mentions having significant chills, cold sweats and dyspnea during dialysis and state he tried to express these symptoms but due to language barrier, he feels his concerns are not being addressed. Otherwise, he mentions no other significant complaints. Mentions good adherence to current regimen at home.  Past Medical History:  Diagnosis Date  . Anemia   . Diabetes mellitus type 2, controlled ([place]) 08/04/2015  . Diabetic retinopathy ([place])   . Diarrhea   . ESRD (end stage renal disease) ([place]) 03/10/2016  . High cholesterol   . Hypertension   . Nausea 08/2017    Review of Systems: Review of Systems  Constitutional: Positive for chills and diaphoresis. Negative for fever and malaise/fatigue.  Eyes: Negative for blurred vision.  Respiratory: Positive for shortness of breath. Negative for cough and wheezing.   Cardiovascular: Positive for palpitations. Negative for chest pain and leg swelling.  Gastrointestinal: Negative for constipation, diarrhea, nausea and vomiting.  Neurological: Negative for headaches.  All other systems reviewed and are negative.    Physical Exam: Vitals:   05/28/19 1510  BP: (!) 146/98  Pulse: 64  Temp: 98 F (36.7 C)  TempSrc: Oral  SpO2: 98%  Weight: 207 lb 12.8 oz (94.3 kg)  Height: 5' 5.5" (1.664 m)    Physical Exam  Constitutional: He appears well-developed and well-nourished. No distress.  HENT:   Mouth/Throat: Oropharynx is clear and moist.  Neck: Normal range of motion. Neck supple.  Cardiovascular: Normal rate, regular rhythm, normal heart sounds and intact distal pulses.  No murmur heard. RUE fistula w/ good bruit  Respiratory: Effort normal and breath sounds normal. He has no wheezes. He has no rales.  GI: Soft. Bowel sounds are normal. He exhibits no distension. There is no abdominal tenderness.  Musculoskeletal: Normal range of motion.        General: No edema.  Skin: Skin is warm and dry.    Assessment & Plan:   Uncontrolled type 2 diabetes mellitus with complication (HCC) Lab Results  Component Value Date   HGBA1C 7.4 (A) 05/28/2019   Above goal but significant improvement with decline from 9.4 to 7.4. With increasingly tighter control of blood glucose. Suspect some of his symptoms may be due to hypoglycemia. Niece does mention that he has had 'low sugars' but he did not his glucometer so unable to confirm. Currently on glipizde, levemir, and trulicity. On chart review, was planning to d/c glipizide once Trulicity has been approved. Per Dr.Kim's note, Trulicity has been approved by [employer]  - Stop glipizde - Emphasized importance of blood glucose monitoring and requested he bring his glucometer at next visit - Trulicity scrip re-sent to pharmacy - C/w Levemir 25 units daily  ESRD (end stage renal disease) ([place]) Continuing to attend hemodialysis sessions regularly. Mentions being dissatisfied with new schedule and endorsing significant side effects. Discussed expected side effects of light-headedness and malaise during dialysis and recommended that he bring up  these symptoms with his nephrologist. Appears euvolemic on exam today.  - F/u with nephro  Chronic idiopathic constipation Mentions hx of chronic constipation and abdominal discomfort. Abdominal exam benign this visit. Requesting medication for constipation.  - Senokot script sent  Essential  hypertension BP Readings from Last 3 Encounters:  05/28/19 (!) 146/98  01/22/19 (!) 159/76  01/08/19 (!) 158/75   BP above goal this visit. Mentions not yet taking mid-day hydralazine. Scheduled for HD tomorrow.   - C/w current regimen  Hyperlipidemia Lipid Panel     Component Value Date/Time   CHOL 93 (L) 02/05/2016 1054   TRIG 166 (H) 02/05/2016 1054   HDL 28 (L) 02/05/2016 1054   CHOLHDL 3.3 02/05/2016 1054   VLDL 33 (H) 02/05/2016 1054   LDLCALC 32 02/05/2016 1054   LDLDIRECT 94 08/06/2015 0539    Pt requires refills on medications with associated diagnosis above.  Reviewed disease process and find this medication to be necessary, will not change dose or alter current therapy.    Patient discussed with Dr. Philipp Ovens  -Gilberto Better, PGY2 [facility] Pager: [phone]

## 2019-05-28 NOTE — Patient Instructions (Addendum)
Dear [name],  Thank you for allowing [name] to provide your care today. Today we discussed your diabetes    I have ordered no labs for you. I will call if any are abnormal.    Today we made the following changes to your medications:    Stop glipizide  Please follow-up in 3 months.    Should you have any questions or concerns please call the internal medicine clinic at [phone].    Thank you for choosing [facility].   Hipoglucemia Hypoglycemia La hipoglucemia se produce cuando el nivel de azcar (glucosa) en la sangre es demasiado bajo. Los signos de un nivel bajo de Location manager en la sangre pueden incluir los siguientes:  Sentir: ? Hambre. ? Preocupacin o nervios (ansiedad). ? Sudoracin y [employer]. ? Confusin. ? Mareos. ? Somnolencia. ? Ganas de vomitar (nuseas).  Tener: ? Latidos cardacos acelerados. ? Dolor de [place]. ? Cambios en la visin. ? Hormigueo y falta de sensibilidad (entumecimiento) alrededor de la boca, labios o lengua. ? Movimientos espasmdicos que no puede controlar (convulsiones).  Dificultades para hacer lo siguiente: ? Moverse (coordinacin). ? Dormir. ? Desmayos. ? Molestarse con facilidad (irritabilidad). Las personas que tienen diabetes y las que no tienen la enfermedad pueden tener un nivel de Location manager en la sangre bajo. El nivel bajo de azcar en la sangre puede ocurrir rpidamente y ser Engineer, maintenance (IT). Tratamiento del nivel bajo de azcar en la sangre [place], el tratamiento de un nivel de azcar en la sangre bajo consiste en ingerir de inmediato un alimento o una bebida que contenga azcar, por ejemplo:  De 4 a 6onzas (de 120 a 118m) de jugo de frutas.  De 4 a 6onzas (de 120 a 1574m de refresco comn (no diettico).  4 onzas (12026mde lecUSG CorporationVarios caramelos duros.  1 cucharada (56m52me miel o azcar. Tratamiento del nivel bajo de azcar en la sangre si tiene diabetes Si puede pensar con  claridad y tragar de manera segura, siga la regla 15/15, que consiste en lo siguiente:  Consuma 15gramos de un hidrato de carbono de accin rpida (carbohidrato). Hable con su mdico acerca de cunto debera consumir.  Lleve siempre consigo una fuente de hidratos de carbono de accin rpida, por ejemplo: ? Comprimidos de azcar (pastillas de glucosa). Tome 3 o 4 comprimidos. ? De 6 a 8unidades de caramelos duros. ? De 4 a 6onzas (de 120 a 150ml52m jugo de frutas. ? De 4 a 6onzas (de 120 a 150ml)62mrefresco comn (no diettico). ? 1 cucharada (56ml) 33miel o azcar.  Contrlese el nivel de azcar en la sangre 56minut27mespus de ingerir el hidrato de carbono.  Si el nivel de azcar en la sangre todava es igual o menor que 70mg/dl 85mmmol/l),76mgiera nuevamente 15gramos de un hidrato de carbono.  Si el nivel de azcar en la sangre no supera los 70mg/dl (378mol/l) de69ms de 3intentos, solicite ayuda de inmediato.  Ingiera una comida o una colacin en el transcurso de 1hora despus de que el nivel de azcar en la sangre se haya normalizado.  Tratamiento del nivel muy bajo de azcar en la sangre Si el nivel de azcar en la sangre es igual o menor que 54mg/dl (3mm32m), sig56mica que est muy bajo (hipoglucemia grave). Esto tambin puede causar:  Desmayos.  Movimientos espasmdicos que no puede controlar (convulsiones).  Prdida de la conciencia (coma). Esto es una emergenciaEngineer, maintenance (IT)ver si los sntomas desaparecen. Solicite atencin mdica deUSAA  inmediato. Comunquese con el servicio de emergencias de su localidad (911 en los [place]). No conduzca por sus propios medios [employer]. Si su nivel de azcar en la sangre es muy bajo y no puede ingerir ningn alimento ni bebida, tal vez deba aplicarse una inyeccin de glucagn. Un familiar o un amigo deben aprender a controlarle el azcar en la sangre y a aplicarle una inyeccin de glucagn. Pregntele al  mdico si debe tener un kit de inyecciones de glucagn en su casa. Siga estas indicaciones en su casa: Indicaciones generales  Delphi de venta libre y los recetados solamente como se lo haya indicado el mdico.  Sepa cul es su nivel de azcar en la sangre como se lo haya indicado el mdico.  Limite el consumo de alcohol a no ms de 43mdida por da si es mujer y no est eParkin y 222midas por da si es hombre. Una medida equivale a 12onzas de cerveza (355106m 5onzas de vino (148m50m 1onzas de bebidas alcohlicas de alta graduacin (44ml71mConcurra a todas las visitas de control como se lo haya indicado el mdico. Esto es importante. Si usted tiene diabetes:   Siga el plan de atencin de la diabetes como se lo haya indicado el mdico. AsegrChief Strategy Officeracer lo siguiente: ? Conozca los signos de un nivel bajo de azcarDispensing opticianome Delphi las indicaciones. ? Siga su plan de ejercicio y de alimentacin. ? Coma a horario. No omita comidas. ? Controle su nivel de azcar en la sangre con la frecuencia que le haya indicado el mdico. Contrleselo siempre antes y despus de hacer ejercicio. ? Siga su plan para los das de enfermedad cuando no pueda comer ni beber normalmente. Elabore este plan de antemano con el mdico.  Comparta su plan de atencin de la diabetes con: ? Sus compaeros de trabajo o de la escuela. ? Las pAnadarko Petroleum Corporationlas que conviNew Londonase anlisis de orina para detecProduct managerencia de cetonas: ? Cuando est enfermo. ? Como se lo haya indicado el mdico.  Lleve consigo una tarjeta, o use un brazalete o una medalla que indique que tiene diabetes. Comunquese con un mdico si:  Tiene problemas para manteAdvertising account executivezcar en la sangre dentro del rango indicado.  Tiene un nivel de azcar en la sangre bajo con frecuencia. Solicite ayuda inmediatamente si:  Contina teniendo sntomas despus de haber comido o ingerido  algo con azcar.  Su nivel de azcar en la sangre es igual o inferior a 54mg/41m3mmol/74m.  Tiene movimientos espasmdicos que no puede cIT consultantesmaya. Estos sntomas pueden indicarSales executivepere a ver si los sntomas desaparecen. Solicite atencin mdica de inmediato. Comunquese con el servicio de emergencias de su localidad (911 en los [place]). No conduzca por sus propios medios hasta eGoldman Sachsal. Resumen  La hipoglucemia sucede cuando el nivel de azcar (glucosa) en la sangre es demasiado bajo.  Las personas que tienen diabetes y las que no tienen la enfermedad pueden tener un nivel de azcar eLocation managersangre bajo. El nivel bajo de azcar en la sangre puede ocurrir rpidamente y ser una emeEngineer, maintenance (IT)rese de conocerRyland Group de un nivel bajo de azcar en la sangre y saber cmo tratarlo.  Lleve siempre consigo una fuente de azcar (hidratos de carbono de accin rpida) para tratar un nivel bajo de azcar en la sangre. Esta informacin no tiene como fiMarine scientist  el consejo del mdico. Asegrese de hacerle al mdico cualquier pregunta que tenga. Document Released: 08/07/2010 Document Revised: 02/15/2018 Document Reviewed: 02/15/2018 Elsevier Patient Education  2020 [employer].

## 2019-05-29 DIAGNOSIS — K5904 Chronic idiopathic constipation: Secondary | ICD-10-CM | POA: Insufficient documentation

## 2019-05-29 NOTE — Assessment & Plan Note (Signed)
Lipid Panel     Component Value Date/Time   CHOL 93 (L) 02/05/2016 1054   TRIG 166 (H) 02/05/2016 1054   HDL 28 (L) 02/05/2016 1054   CHOLHDL 3.3 02/05/2016 1054   VLDL 33 (H) 02/05/2016 1054   LDLCALC 32 02/05/2016 1054   LDLDIRECT 94 08/06/2015 0539    Pt requires refills on medications with associated diagnosis above.  Reviewed disease process and find this medication to be necessary, will not change dose or alter current therapy.

## 2019-05-29 NOTE — Assessment & Plan Note (Signed)
BP Readings from Last 3 Encounters:  05/28/19 (!) 146/98  01/22/19 (!) 159/76  01/08/19 (!) 158/75   BP above goal this visit. Mentions not yet taking mid-day hydralazine. Scheduled for HD tomorrow.   - C/w current regimen

## 2019-05-29 NOTE — Assessment & Plan Note (Signed)
Lab Results  Component Value Date   HGBA1C 7.4 (A) 05/28/2019   Above goal but significant improvement with decline from 9.4 to 7.4. With increasingly tighter control of blood glucose. Suspect some of his symptoms may be due to hypoglycemia. Niece does mention that he has had 'low sugars' but he did not his glucometer so unable to confirm. Currently on glipizde, levemir, and trulicity. On chart review, was planning to d/c glipizide once Trulicity has been approved. Per Dr.Kim's note, Trulicity has been approved by [employer]  - Stop glipizde - Emphasized importance of blood glucose monitoring and requested he bring his glucometer at next visit - Trulicity scrip re-sent to pharmacy - C/w Levemir 25 units daily

## 2019-05-29 NOTE — Assessment & Plan Note (Signed)
Mentions hx of chronic constipation and abdominal discomfort. Abdominal exam benign this visit. Requesting medication for constipation.  - Senokot script sent

## 2019-05-29 NOTE — Assessment & Plan Note (Signed)
Continuing to attend hemodialysis sessions regularly. Mentions being dissatisfied with new schedule and endorsing significant side effects. Discussed expected side effects of light-headedness and malaise during dialysis and recommended that he bring up these symptoms with his nephrologist. Appears euvolemic on exam today.  - F/u with nephro

## 2019-05-31 NOTE — Progress Notes (Signed)
Internal Medicine Clinic Attending ° °Case discussed with Dr. Lee at the time of the visit.  We reviewed the resident’s history and exam and pertinent patient test results.  I agree with the assessment, diagnosis, and plan of care documented in the resident’s note.  °

## 2019-07-04 MED FILL — ATORVASTATIN 40 MG TABLET: 40 | 30 days supply | Qty: 30 | Fill #1

## 2019-07-04 MED FILL — AMLODIPINE BESYLATE 10 MG T: 10 | 30 days supply | Qty: 30 | Fill #5

## 2019-07-04 MED FILL — hydrALAZINE HCL 100 MG TABS: 100 | 30 days supply | Qty: 90 | Fill #1

## 2019-07-04 MED FILL — CARVEDILOL 12.5 MG TABLET: 12.5 | 30 days supply | Qty: 60 | Fill #1

## 2019-08-10 MED FILL — ATORVASTATIN 40 MG TABLET: 40 | 30 days supply | Qty: 30 | Fill #2

## 2019-08-10 MED FILL — UNIFINE PENTIPS 32GX5/32: 32G X 4 MM | 30 days supply | Qty: 100 | Fill #1

## 2019-08-10 MED FILL — AMLODIPINE BESYLATE 10 MG T: 10 | 30 days supply | Qty: 30 | Fill #1

## 2019-08-10 MED FILL — CARVEDILOL 12.5 MG TABLET: 12.5 | 30 days supply | Qty: 60 | Fill #2

## 2019-08-10 MED FILL — hydrALAZINE HCL 100 MG TABS: 100 | 30 days supply | Qty: 90 | Fill #2

## 2019-08-27 ENCOUNTER — Ambulatory Visit: Payer: Self-pay | Admitting: Internal Medicine

## 2019-08-27 ENCOUNTER — Encounter: Payer: Self-pay | Admitting: Internal Medicine

## 2019-08-27 VITALS — BP 162/83 | HR 61 | Temp 97.9°F | Ht 65.5 in | Wt 211.6 lb

## 2019-08-27 DIAGNOSIS — I10 Essential (primary) hypertension: Secondary | ICD-10-CM

## 2019-08-27 DIAGNOSIS — Z992 Dependence on renal dialysis: Secondary | ICD-10-CM

## 2019-08-27 DIAGNOSIS — E1122 Type 2 diabetes mellitus with diabetic chronic kidney disease: Secondary | ICD-10-CM

## 2019-08-27 DIAGNOSIS — R252 Cramp and spasm: Secondary | ICD-10-CM

## 2019-08-27 DIAGNOSIS — H548 Legal blindness, as defined in USA: Secondary | ICD-10-CM

## 2019-08-27 DIAGNOSIS — E1165 Type 2 diabetes mellitus with hyperglycemia: Secondary | ICD-10-CM

## 2019-08-27 DIAGNOSIS — E118 Type 2 diabetes mellitus with unspecified complications: Secondary | ICD-10-CM

## 2019-08-27 DIAGNOSIS — N186 End stage renal disease: Secondary | ICD-10-CM

## 2019-08-27 DIAGNOSIS — Z79899 Other long term (current) drug therapy: Secondary | ICD-10-CM

## 2019-08-27 DIAGNOSIS — IMO0002 Reserved for concepts with insufficient information to code with codable children: Secondary | ICD-10-CM

## 2019-08-27 DIAGNOSIS — Z794 Long term (current) use of insulin: Secondary | ICD-10-CM

## 2019-08-27 DIAGNOSIS — I120 Hypertensive chronic kidney disease with stage 5 chronic kidney disease or end stage renal disease: Secondary | ICD-10-CM

## 2019-08-27 LAB — POCT GLYCOSYLATED HEMOGLOBIN (HGB A1C): Hemoglobin A1C: 10 % — AB (ref 4.0–5.6)

## 2019-08-27 LAB — GLUCOSE, CAPILLARY: Glucose-Capillary: 188 mg/dL — ABNORMAL HIGH (ref 70–99)

## 2019-08-27 NOTE — Assessment & Plan Note (Signed)
Patient presents for T2DM follow up. Currently on Basaglar 25 units BID and Trulicity 1.5 mg weekly. He is adherent, his niece helps him with injections as he is legally blind. Glipizide was discontinued 3 months ago due to hypoglycemic episodes. Since then, patient has been experiencing worsening hyperglycemia with BG in the 300s. He does not check BG at home but BG checked every other day at HD and they have been persistently in the 300s. A1c today 10 from 7.4 three months ago (in the setting of ESRD). We discussed adhering to a low carb diet, which has been his main barrier in the past for optimal glycemic control. However, he states he eats what is available at his house and that is usually tortillas, beans, and other foods high in carbs.  - Increase Basaglar to 30 units BID  - Continue Trulicity 1.5 mg weekly  - Asked patient to please check BG 2-3x/day  - Ophthalmology referral

## 2019-08-27 NOTE — Patient Instructions (Addendum)
Aumente la dosis de insulina a 30 [employer] al dia.   Para los pies puede usar Cetaphil moisturizing cream.   Para los calambres de las piernas, [place] lo venden en todas las farmacias over the counter.   Haga una cita de seguimiento conmigo en 3 meses.   - Dr. Frederico Hamman

## 2019-08-27 NOTE — Progress Notes (Signed)
Internal Medicine Clinic Attending  Case discussed with Dr. Santos-Sanchez at the time of the visit.  We reviewed the resident's history and exam and pertinent patient test results.  I agree with the assessment, diagnosis, and plan of care documented in the resident's note.    

## 2019-08-27 NOTE — Assessment & Plan Note (Signed)
Patient has been experiencing bilateral leg cramps during HD for the past year. He has not been able to communicate this to his nephrologist at HD due to language barrier. I recommended over the counter vitamin E 400 units daily.

## 2019-08-27 NOTE — Assessment & Plan Note (Signed)
BP above goal today, though he is due fo rHD tomorrow (last session on 2 days ago). On amlodipine 10, Coreg 12.5 BID, and hydralazine 100 mg TID. He is adherent. Will not adjust regimen as he is mildly volume up and suspect BP will improve with HD.

## 2019-08-27 NOTE — Progress Notes (Signed)
   CC: HTN, DM follow up, leg cramps   HPI:  [name] is a 54 y.o. year-old male with PMH listed below who presents to clinic for HTN, T2DM follow up, leg cramps. Please see problem based assessment and plan for further details.   Past Medical History:  Diagnosis Date  . Anemia   . Diabetes mellitus type 2, controlled ([place]) 08/04/2015  . Diabetic retinopathy ([place])   . Diarrhea   . ESRD (end stage renal disease) ([place]) 03/10/2016  . High cholesterol   . Hypertension   . Nausea 08/2017   Review of Systems:   Review of Systems  Constitutional: Negative for chills, fever, malaise/fatigue and weight loss.  Respiratory: Negative for cough and shortness of breath.   Cardiovascular: Positive for leg swelling. Negative for chest pain and palpitations.  Musculoskeletal:       Bilateral leg cramps   Neurological: Negative for dizziness and headaches.    Physical Exam:  Vitals:   08/27/19 1325  BP: (!) 162/83  Pulse: 61  Temp: 97.9 F (36.6 C)  TempSrc: Oral  SpO2: 98%  Weight: 211 lb 9.6 oz (96 kg)  Height: 5' 5.5" (1.664 m)    General: chronically ill appearing male in NAD  Cardiac: regular rate and rhythm, nl S1/S2, no murmurs, rubs or gallop Pulm: CTAB, no wheezes or crackles, no increased work of breathing on room air  Ext: warm and well perfused, 1+ pitting edema R>L   Assessment & Plan:   See Encounters Tab for problem based charting.  Patient discussed with Dr. Lynnae January

## 2019-09-12 ENCOUNTER — Telehealth: Payer: Self-pay | Admitting: *Deleted

## 2019-09-12 NOTE — Telephone Encounter (Signed)
Phone note opened in error - no call placed or received at this time. Yvonna Alanis, RN, 09/12/19 4:47P

## 2019-09-17 MED FILL — CARVEDILOL 12.5 MG TABLET: 12.5 | 30 days supply | Qty: 60 | Fill #3

## 2019-09-17 MED FILL — hydrALAZINE HCL 100 MG TABS: 100 | 30 days supply | Qty: 90 | Fill #3

## 2019-09-17 MED FILL — ATORVASTATIN 40 MG TABLET: 40 | 30 days supply | Qty: 30 | Fill #3

## 2019-09-17 MED FILL — AMLODIPINE BESYLATE 10 MG T: 10 | 30 days supply | Qty: 30 | Fill #2

## 2019-09-24 ENCOUNTER — Ambulatory Visit: Payer: Self-pay

## 2019-09-26 ENCOUNTER — Ambulatory Visit: Payer: Self-pay | Admitting: Internal Medicine

## 2019-09-26 ENCOUNTER — Other Ambulatory Visit: Payer: Self-pay

## 2019-09-26 ENCOUNTER — Encounter: Payer: Self-pay | Admitting: Internal Medicine

## 2019-09-26 VITALS — BP 140/69 | HR 65 | Temp 98.4°F | Wt 211.0 lb

## 2019-09-26 DIAGNOSIS — H90A11 Conductive hearing loss, unilateral, right ear with restricted hearing on the contralateral side: Secondary | ICD-10-CM

## 2019-09-26 NOTE — Patient Instructions (Signed)
Eric Glenn,   [place] ver a un especialista del oido para que le hagabn pruebas mas especidicas de la audicion. Es BIEN importante que traiga los documentos necesarios para renovar la tarjeta naranja. mientras mas rapido haga esto, mas rapido lo podremos referir.   Cuando renueve su tarjeta naranja tambien le hare un referido al doctor de los riones.   - Dr. Frederico Hamman

## 2019-09-27 DIAGNOSIS — H90A11 Conductive hearing loss, unilateral, right ear with restricted hearing on the contralateral side: Secondary | ICD-10-CM | POA: Insufficient documentation

## 2019-09-27 NOTE — Progress Notes (Signed)
   CC: Hearing loss   HPI:  [name] is a 54 y.o. year-old male with PMH listed below who presents to clinic for R hearing loss. Please see problem based assessment and plan for further details.   Past Medical History:  Diagnosis Date  . Anemia   . Diabetes mellitus type 2, controlled (Eric Glenn) 08/04/2015  . Diabetic retinopathy (Eric Glenn)   . Diarrhea   . ESRD (end stage renal disease) (Eric Glenn) 03/10/2016  . High cholesterol   . Hypertension   . Nausea 08/2017   Review of Systems:   Review of Systems  Constitutional: Negative for chills, fever and malaise/fatigue.  HENT: Positive for hearing loss. Negative for ear discharge, ear pain and tinnitus.     Physical Exam:  Vitals:   09/26/19 1518  BP: 140/69  Pulse: 65  Temp: 98.4 F (36.9 C)  TempSrc: Oral  SpO2: 98%  Weight: 211 lb (95.7 kg)    General: chronically-ill appearing male in NAD R ear: no TM perforations but unable to visualize TM fully, no cerumen impaction noted, Weber lateralizes to the R and BC > AC on Rinne test, no signs of infection noted  L ear: unremarkable, no cerumen impaction or TM perforations    Assessment & Plan:   See Encounters Tab for problem based charting.  Patient discussed with Dr. Daryll Drown

## 2019-09-27 NOTE — Assessment & Plan Note (Signed)
Patient noticed right-sided hearing loss about 1 week ago that has been gradually worsening. He reports having some hearing issues on his L ear in the past due to cerumen impaction, but this feels different. He is unable to hear much from R ear at this time. He does not recall any precipitating events. denies recent trauma, illness, and past/recent exposure to loud noises. He also denies associated symptoms such as ear pain, discharge, tinnitus, HA, N/V, and dizziness. On exam, Weber lateralized to the R and bone conduction > air conduction suggestive of conductive hearing loss. I did not appreciate any masses/tumors, foreign objects/animals, signs of infection, or TM perforations on exam though I was not able to visualize the L TM fully.   Unclear etiology at this time. Will need to rule out middle ear pathology with imaging such as a mass. Will also need ENT/audiology referral for further evaluation. Unfortunately his Orange Card expired 2 months ago and I am unable to order these. He is in the process of renewing it. Advised him to let us know as soon as he obtains it to place appropriate orders.

## 2019-10-02 NOTE — Progress Notes (Signed)
Internal Medicine Clinic Attending  Case discussed with Dr. Santos-Sanchez at the time of the visit.  We reviewed the resident's history and exam and pertinent patient test results.  I agree with the assessment, diagnosis, and plan of care documented in the resident's note.    

## 2019-10-08 NOTE — Addendum Note (Signed)
Addended by: Hulan Fray on: 10/08/2019 12:51 PM   Modules accepted: Orders

## 2019-10-18 ENCOUNTER — Other Ambulatory Visit: Payer: Self-pay | Admitting: Internal Medicine

## 2019-10-18 DIAGNOSIS — I10 Essential (primary) hypertension: Secondary | ICD-10-CM

## 2019-10-18 MED FILL — hydrALAZINE HCL 100 MG TABS: 100 | 30 days supply | Qty: 90 | Fill #4

## 2019-10-18 MED FILL — AMLODIPINE BESYLATE 10 MG T: 10 | 30 days supply | Qty: 30 | Fill #3

## 2019-10-18 MED FILL — ATORVASTATIN 40 MG TABLET: 40 | 30 days supply | Qty: 30 | Fill #4

## 2019-10-22 MED FILL — CARVEDILOL 12.5 MG TABLET: 12.5 | 30 days supply | Qty: 60 | Fill #2

## 2019-10-26 ENCOUNTER — Other Ambulatory Visit: Payer: Self-pay

## 2019-10-26 ENCOUNTER — Ambulatory Visit (INDEPENDENT_AMBULATORY_CARE_PROVIDER_SITE_OTHER): Payer: Self-pay | Admitting: Internal Medicine

## 2019-10-26 VITALS — BP 149/85 | HR 94 | Temp 99.9°F | Wt 210.8 lb

## 2019-10-26 DIAGNOSIS — H6012 Cellulitis of left external ear: Secondary | ICD-10-CM

## 2019-10-26 MED ORDER — OXYCODONE HCL 5 MG PO TABS: 5.0000 mg | ORAL_TABLET | Freq: Two times a day (BID) | ORAL | 0 refills | Status: AC | PRN

## 2019-10-26 MED ORDER — AMOXICILLIN-POT CLAVULANATE 250-125 MG PO TABS: 1.0000 | ORAL_TABLET | Freq: Two times a day (BID) | ORAL | 0 refills | Status: DC

## 2019-10-26 MED FILL — AMOX-CLAV 250-125 MG TABLET: 250-125 | 7 days supply | Qty: 14 | Fill #0

## 2019-10-26 MED FILL — oxyCODONE HCL 5 MG TABS: 5 | 5 days supply | Qty: 10 | Fill #0

## 2019-10-26 NOTE — Progress Notes (Signed)
Acute Office Visit  Subjective:    Patient ID: Eric Glenn, male    DOB: 05-12-1966, 54 y.o.   MRN: [id number]  Chief Complaint  Patient presents with  . Left ear pain/swollen    Hurts to eat. Does not have the orange card.    HPI Patient is in today for left ear pain that began 5 days ago. Please see problem based charting for further details.   Past Medical History:  Diagnosis Date  . Anemia   . Diabetes mellitus type 2, controlled ([place]) 08/04/2015  . Diabetic retinopathy ([place])   . Diarrhea   . ESRD (end stage renal disease) ([place]) 03/10/2016  . High cholesterol   . Hypertension   . Nausea 08/2017    Past Surgical History:  Procedure Laterality Date  . AV FISTULA PLACEMENT Right 09/04/2015   Procedure: ARTERIOVENOUS (AV) FISTULA CREATION;  Surgeon: Angelia Mould, MD;  Location: [facility];  Service: Vascular;  Laterality: Right;  . AV FISTULA PLACEMENT Right 01/22/2016   Procedure: RIGHT UPPER ARM BRACHIOCEPHALIC ARTERIOVENOUS (AV) FISTULA CREATION;  Surgeon: Angelia Mould, MD;  Location: [facility];  Service: Vascular;  Laterality: Right;  . IR DIALY SHUNT INTRO [place] W/IMG RIGHT Right 12/08/2018  . LIGATION OF ARTERIOVENOUS  FISTULA Right 01/22/2016   Procedure: LIGATION OF RIGHT FOREARM ARTERIOVENOUS  FISTULA;  Surgeon: Angelia Mould, MD;  Location: [facility];  Service: Vascular;  Laterality: Right;  . PERIPHERAL VASCULAR CATHETERIZATION N/A 01/12/2016   Procedure: Fistulagram;  Surgeon: Angelia Mould, MD;  Location: [facility];  Service: Cardiovascular;  Laterality: N/A;  . UMBILICAL HERNIA REPAIR      Family History  Problem Relation Age of Onset  . Hypertension Mother   . Hyperlipidemia Mother   . Diabetes Mellitus II Sister     Social History   Socioeconomic History  . Marital status: Single    Spouse name: Not on file  . Number of children: Not on file  . Years of education: Not on file  . Highest  education level: Not on file  Occupational History  . Not on file  Tobacco Use  . Smoking status: Former Smoker    Quit date: 10/22/2003    Years since quitting: 16.0  . Smokeless tobacco: Never Used  Substance and Sexual Activity  . Alcohol use: No    Alcohol/week: 0.0 standard drinks  . Drug use: No  . Sexual activity: Not on file  Other Topics Concern  . Not on file  Social History Narrative  . Not on file   Social Determinants of Health   Financial Resource Strain:   . Difficulty of Paying Living Expenses:   Food Insecurity:   . Worried About Charity fundraiser in the Last Year:   . Arboriculturist in the Last Year:   Transportation Needs:   . Film/video editor (Medical):   Marland Kitchen Lack of Transportation (Non-Medical):   Physical Activity:   . Days of Exercise per Week:   . Minutes of Exercise per Session:   Stress:   . Feeling of Stress :   Social Connections:   . Frequency of Communication with Friends and Family:   . Frequency of Social Gatherings with Friends and Family:   . Attends Religious Services:   . Active Member of Clubs or Organizations:   . Attends Archivist Meetings:   Marland Kitchen Marital Status:   Intimate Partner Violence:   . Fear of  Current or Ex-Partner:   . Emotionally Abused:   Marland Kitchen Physically Abused:   . Sexually Abused:     Outpatient Medications Prior to Visit  Medication Sig Dispense Refill  . amLODipine (NORVASC) 10 MG tablet Take 1 tablet (10 mg total) by mouth daily. 90 tablet 3  . atorvastatin (LIPITOR) 40 MG tablet Take 1 tablet (40 mg total) by mouth daily. 90 tablet 3  . carvedilol (COREG) 12.5 MG tablet TAKE 1 TABLET (12.5 MG TOTAL) BY MOUTH 2 (TWO) TIMES DAILY. 60 tablet 3  . Dulaglutide (TRULICITY) 1.5 PY/0.9XI SOPN Inject 1.5 mg into the skin once a week. 2 mL 3  . fluticasone (FLONASE) 50 MCG/ACT nasal spray Place 1 spray into both nostrils daily. (Patient taking differently: Place 1 spray into both nostrils daily as needed  for allergies. ) 16 g 2  . glucose blood (CONTOUR NEXT TEST) test strip Test blood glucose 4 times daily 100 each 11  . hydrALAZINE (APRESOLINE) 100 MG tablet Take 1 tablet (100 mg total) by mouth 3 (three) times daily. 90 tablet 10  . Insulin Glargine (BASAGLAR KWIKPEN) 100 UNIT/ML SOPN Inject 0.25 mLs (25 Units total) into the skin 2 (two) times daily. 20 pen 6  . Insulin Pen Needle 32G X 4 MM MISC Use 2 needles a day to inject insulin. IM Program 100 each 12  . Lancets MISC Test blood glucose 4 times daily 100 each 11  . multivitamin (RENA-VIT) TABS tablet Take 1 tablet by mouth daily. 60 tablet 5  . senna-docusate (SENOKOT S) 8.6-50 MG tablet Take 1 tablet by mouth daily. 30 tablet 0   No facility-administered medications prior to visit.    Allergies  Allergen Reactions  . Poractant Alfa Rash and Other (See Comments)    Raises blood pressure  . Pork-Derived Products Rash and Other (See Comments)    Raises blood pressure    Review of Systems  Constitutional: negative for fevers, chills HEENT: negative for sinus pain, ear drainage, dental pain, changes in hearing GI: negative for nausea, vomiting      Objective:    Physical Exam  General: chronically ill appearing, NAD HEENT: entire left external ear with erythema, swelling and increased warmth. Very tender just anterior to tragus. Did not tolerate full otoscopic exam, but no obvious impaction or erythema to inner ear. He has poor dentition, but no obvious dental abscess or gum swelling.   BP (!) 149/85 (BP Location: Left Arm, Patient Position: Sitting, Cuff Size: Normal)   Pulse 94   Temp 99.9 F (37.7 C) (Oral)   Wt 210 lb 12.8 oz (95.6 kg)   SpO2 99% Comment: RA  BMI 34.55 kg/m  Wt Readings from Last 3 Encounters:  10/26/19 210 lb 12.8 oz (95.6 kg)  09/26/19 211 lb (95.7 kg)  08/27/19 211 lb 9.6 oz (96 kg)    Health Maintenance Due  Topic Date Due  . OPHTHALMOLOGY EXAM  Never done  . TETANUS/TDAP  Never done  .  COLONOSCOPY  Never done    There are no preventive care reminders to display for this patient.   Lab Results  Component Value Date   TSH 2.270 08/28/2015   Lab Results  Component Value Date   WBC 8.2 12/08/2018   HGB 10.9 (L) 12/08/2018   HCT 30.8 (L) 12/08/2018   MCV 86.8 12/08/2018   PLT 148 (L) 12/08/2018   Lab Results  Component Value Date   NA 135 12/08/2018   K 3.9 12/08/2018  CO2 27 12/08/2018   GLUCOSE 255 (H) 12/08/2018   BUN 30 (H) 12/08/2018   CREATININE 5.88 (H) 12/08/2018   BILITOT 0.4 04/18/2018   ALKPHOS 85 04/18/2018   AST 20 04/18/2018   ALT 30 04/18/2018   PROT 7.7 04/18/2018   ALBUMIN 4.2 04/18/2018   CALCIUM 8.8 (L) 12/08/2018   ANIONGAP 14 12/08/2018   Lab Results  Component Value Date   CHOL 93 (L) 02/05/2016   Lab Results  Component Value Date   HDL 28 (L) 02/05/2016   Lab Results  Component Value Date   LDLCALC 32 02/05/2016   Lab Results  Component Value Date   TRIG 166 (H) 02/05/2016   Lab Results  Component Value Date   CHOLHDL 3.3 02/05/2016   Lab Results  Component Value Date   HGBA1C 10.0 (A) 08/27/2019       Assessment & Plan:   Problem List Items Addressed This Visit      Nervous and Auditory   Cellulitis of left ear - Primary    Patient presents with progressive pain and swelling of left ear. It has become extremely difficult for him to sleep or chew. No systemic signs of infection. Infection appears to be cellulitis of external ear, but can rule out dental origin. Will cover both etiologies with 7 day course of Augmentin with dosing recommendation for dialysis patients. Given his high risk of infection complications, will arrange for close follow-up in 1 week.           Meds ordered this encounter  Medications  . amoxicillin-clavulanate (AUGMENTIN) 250-125 MG tablet    Sig: Take 1 tablet by mouth 2 (two) times daily for 7 days.    Dispense:  14 tablet    Refill:  0  . oxyCODONE (ROXICODONE) 5 MG  immediate release tablet    Sig: Take 1 tablet (5 mg total) by mouth every 12 (twelve) hours as needed for up to 5 days for severe pain.    Dispense:  10 tablet    Refill:  0     Eara Burruel D Kyndal Heringer, DO

## 2019-10-26 NOTE — Patient Instructions (Addendum)
[name], It was nice meeting you. We are treating you for an infection around your ear.  You will take the antibiotic twice daily for 7 days.  Be sure to let the dialysis center know you are taking this.  I have also given you something a little stronger for the pain.   We need to see you back in a week to make sure this is getting better.   Hope you feel better soon!   Sr. Eric Glenn, Fue un placer conocerte. Lo estamos tratando por una infeccin alrededor de su odo. [place] 7 das. Asegrese de informar al centro de dilisis que est tomando [employer]. Tambin te he dado algo un poco ms fuerte para Conservation officer, historic buildings.  Necesitamos verte de nuevo en una semana para asegurarnos de que esto est mejorando.  Espero que te sientas mejor pronto!

## 2019-10-26 NOTE — Assessment & Plan Note (Signed)
Patient presents with progressive pain and swelling of left ear. It has become extremely difficult for him to sleep or chew. No systemic signs of infection. Infection appears to be cellulitis of external ear, but can rule out dental origin. Will cover both etiologies with 7 day course of Augmentin with dosing recommendation for dialysis patients. Given his high risk of infection complications, will arrange for close follow-up in 1 week.

## 2019-10-27 ENCOUNTER — Encounter (HOSPITAL_COMMUNITY): Payer: Self-pay | Admitting: Emergency Medicine

## 2019-10-27 ENCOUNTER — Inpatient Hospital Stay (HOSPITAL_COMMUNITY)
Admission: EM | Admit: 2019-10-27 | Discharge: 2019-10-29 | DRG: 152 | Disposition: A | Discharge status: Home / Self Care | Payer: Self-pay | Attending: Internal Medicine | Admitting: Internal Medicine

## 2019-10-27 DIAGNOSIS — Z992 Dependence on renal dialysis: Secondary | ICD-10-CM

## 2019-10-27 DIAGNOSIS — H9193 Unspecified hearing loss, bilateral: Secondary | ICD-10-CM | POA: Diagnosis present

## 2019-10-27 DIAGNOSIS — Z794 Long term (current) use of insulin: Secondary | ICD-10-CM

## 2019-10-27 DIAGNOSIS — H7092 Unspecified mastoiditis, left ear: Secondary | ICD-10-CM

## 2019-10-27 DIAGNOSIS — E669 Obesity, unspecified: Secondary | ICD-10-CM | POA: Diagnosis present

## 2019-10-27 DIAGNOSIS — E1122 Type 2 diabetes mellitus with diabetic chronic kidney disease: Secondary | ICD-10-CM | POA: Diagnosis present

## 2019-10-27 DIAGNOSIS — Z8249 Family history of ischemic heart disease and other diseases of the circulatory system: Secondary | ICD-10-CM

## 2019-10-27 DIAGNOSIS — Z6835 Body mass index (BMI) 35.0-35.9, adult: Secondary | ICD-10-CM

## 2019-10-27 DIAGNOSIS — N186 End stage renal disease: Secondary | ICD-10-CM | POA: Diagnosis present

## 2019-10-27 DIAGNOSIS — H70002 Acute mastoiditis without complications, left ear: Principal | ICD-10-CM | POA: Diagnosis present

## 2019-10-27 DIAGNOSIS — Z79899 Other long term (current) drug therapy: Secondary | ICD-10-CM

## 2019-10-27 DIAGNOSIS — Z833 Family history of diabetes mellitus: Secondary | ICD-10-CM

## 2019-10-27 DIAGNOSIS — Z83438 Family history of other disorder of lipoprotein metabolism and other lipidemia: Secondary | ICD-10-CM

## 2019-10-27 DIAGNOSIS — Z20822 Contact with and (suspected) exposure to covid-19: Secondary | ICD-10-CM | POA: Diagnosis present

## 2019-10-27 DIAGNOSIS — E11319 Type 2 diabetes mellitus with unspecified diabetic retinopathy without macular edema: Secondary | ICD-10-CM | POA: Diagnosis present

## 2019-10-27 DIAGNOSIS — Z87891 Personal history of nicotine dependence: Secondary | ICD-10-CM

## 2019-10-27 DIAGNOSIS — E785 Hyperlipidemia, unspecified: Secondary | ICD-10-CM | POA: Diagnosis present

## 2019-10-27 DIAGNOSIS — I120 Hypertensive chronic kidney disease with stage 5 chronic kidney disease or end stage renal disease: Secondary | ICD-10-CM | POA: Diagnosis present

## 2019-10-27 DIAGNOSIS — D631 Anemia in chronic kidney disease: Secondary | ICD-10-CM | POA: Diagnosis present

## 2019-10-27 DIAGNOSIS — H6022 Malignant otitis externa, left ear: Secondary | ICD-10-CM | POA: Diagnosis present

## 2019-10-27 DIAGNOSIS — E7800 Pure hypercholesterolemia, unspecified: Secondary | ICD-10-CM | POA: Diagnosis present

## 2019-10-27 MED ORDER — CIPROFLOXACIN-DEXAMETHASONE 0.3-0.1 % OT SUSP
4.0000 [drp] | Freq: Once | OTIC | Status: AC
  Administered 2019-10-28: 4 [drp] via OTIC
  Filled 2019-10-27: qty 7.5

## 2019-10-27 MED ORDER — AMOXICILLIN-POT CLAVULANATE 875-125 MG PO TABS
1.0000 | ORAL_TABLET | Freq: Once | ORAL | Status: AC
  Administered 2019-10-28: 1 via ORAL
  Filled 2019-10-27: qty 1

## 2019-10-27 NOTE — ED Triage Notes (Signed)
Pt c/o severe R ear pain x 1 week, pt seen by PCP 4/9 but can not pick up antbx until Monday, pt reports difficulty hearing in L ear.

## 2019-10-28 ENCOUNTER — Other Ambulatory Visit: Payer: Self-pay

## 2019-10-28 ENCOUNTER — Emergency Department (HOSPITAL_COMMUNITY): Payer: Self-pay

## 2019-10-28 DIAGNOSIS — H70002 Acute mastoiditis without complications, left ear: Secondary | ICD-10-CM | POA: Diagnosis present

## 2019-10-28 LAB — GLUCOSE, CAPILLARY
Glucose-Capillary: 153 mg/dL — ABNORMAL HIGH (ref 70–99)
Glucose-Capillary: 119 mg/dL — ABNORMAL HIGH (ref 70–99)
Glucose-Capillary: 110 mg/dL — ABNORMAL HIGH (ref 70–99)
Glucose-Capillary: 150 mg/dL — ABNORMAL HIGH (ref 70–99)

## 2019-10-28 LAB — CBC
HCT: 31.9 % — ABNORMAL LOW (ref 39.0–52.0)
HCT: 31 % — ABNORMAL LOW (ref 39.0–52.0)
Hemoglobin: 10.8 g/dL — ABNORMAL LOW (ref 13.0–17.0)
Hemoglobin: 10.5 g/dL — ABNORMAL LOW (ref 13.0–17.0)
MCH: 30.5 pg (ref 26.0–34.0)
MCH: 30.6 pg (ref 26.0–34.0)
MCHC: 33.9 g/dL (ref 30.0–36.0)
MCHC: 33.9 g/dL (ref 30.0–36.0)
MCV: 90.1 fL (ref 80.0–100.0)
MCV: 90.4 fL (ref 80.0–100.0)
Platelets: 144 10*3/uL — ABNORMAL LOW (ref 150–400)
Platelets: 146 10*3/uL — ABNORMAL LOW (ref 150–400)
RBC: 3.54 MIL/uL — ABNORMAL LOW (ref 4.22–5.81)
RBC: 3.43 MIL/uL — ABNORMAL LOW (ref 4.22–5.81)
RDW: 12.9 % (ref 11.5–15.5)
RDW: 12.8 % (ref 11.5–15.5)
WBC: 10.5 10*3/uL (ref 4.0–10.5)
WBC: 9.5 10*3/uL (ref 4.0–10.5)
nRBC: 0 % (ref 0.0–0.2)
nRBC: 0 % (ref 0.0–0.2)

## 2019-10-28 LAB — BASIC METABOLIC PANEL
Anion gap: 15 (ref 5–15)
BUN: 21 mg/dL — ABNORMAL HIGH (ref 6–20)
CO2: 26 mmol/L (ref 22–32)
Calcium: 8.9 mg/dL (ref 8.9–10.3)
Chloride: 96 mmol/L — ABNORMAL LOW (ref 98–111)
Creatinine, Ser: 5.64 mg/dL — ABNORMAL HIGH (ref 0.61–1.24)
GFR calc Af Amer: 12 mL/min — ABNORMAL LOW (ref >60)
GFR calc non Af Amer: 10 mL/min — ABNORMAL LOW (ref >60)
Glucose, Bld: 200 mg/dL — ABNORMAL HIGH (ref 70–99)
Potassium: 3.8 mmol/L (ref 3.5–5.1)
Sodium: 137 mmol/L (ref 135–145)

## 2019-10-28 LAB — MRSA PCR SCREENING: MRSA by PCR: NEGATIVE

## 2019-10-28 LAB — CBG MONITORING, ED: Glucose-Capillary: 186 mg/dL — ABNORMAL HIGH (ref 70–99)

## 2019-10-28 LAB — SARS CORONAVIRUS 2 (TAT 6-24 HRS): SARS Coronavirus 2: NEGATIVE

## 2019-10-28 LAB — HIV ANTIBODY (ROUTINE TESTING W REFLEX): HIV Screen 4th Generation wRfx: NONREACTIVE

## 2019-10-28 MED ORDER — CIPROFLOXACIN-DEXAMETHASONE 0.3-0.1 % OT SUSP
4.0000 [drp] | Freq: Two times a day (BID) | OTIC | Status: DC
  Administered 2019-10-28 – 2019-10-29 (×3): 4 [drp] via OTIC
  Filled 2019-10-28: qty 7.5

## 2019-10-28 MED ORDER — ACETAMINOPHEN 650 MG RE SUPP: 650.0000 mg | Freq: Four times a day (QID) | RECTAL | Status: DC | PRN

## 2019-10-28 MED ORDER — HYDROMORPHONE HCL 2 MG PO TABS
2.0000 mg | ORAL_TABLET | Freq: Four times a day (QID) | ORAL | Status: DC | PRN
  Administered 2019-10-29: 2 mg via ORAL
  Filled 2019-10-28: qty 1

## 2019-10-28 MED ORDER — HYDROMORPHONE HCL 2 MG PO TABS: 2.0000 mg | ORAL_TABLET | ORAL | Status: DC | PRN

## 2019-10-28 MED ORDER — IOHEXOL 300 MG/ML  SOLN
75.0000 mL | Freq: Once | INTRAMUSCULAR | Status: AC | PRN
  Administered 2019-10-28: 75 mL via INTRAVENOUS

## 2019-10-28 MED ORDER — INSULIN DETEMIR 100 UNIT/ML ~~LOC~~ SOLN
15.0000 [IU] | Freq: Every day | SUBCUTANEOUS | Status: DC
  Administered 2019-10-28 – 2019-10-29 (×2): 15 [IU] via SUBCUTANEOUS
  Filled 2019-10-28 (×3): qty 0.15

## 2019-10-28 MED ORDER — INSULIN ASPART 100 UNIT/ML ~~LOC~~ SOLN
4.0000 [IU] | Freq: Three times a day (TID) | SUBCUTANEOUS | Status: DC
  Administered 2019-10-29: 4 [IU] via SUBCUTANEOUS

## 2019-10-28 MED ORDER — HEPARIN SODIUM (PORCINE) 5000 UNIT/ML IJ SOLN
5000.0000 [IU] | Freq: Three times a day (TID) | INTRAMUSCULAR | Status: DC
  Administered 2019-10-28 – 2019-10-29 (×3): 5000 [IU] via SUBCUTANEOUS
  Filled 2019-10-28 (×3): qty 1

## 2019-10-28 MED ORDER — AMLODIPINE BESYLATE 10 MG PO TABS
10.0000 mg | ORAL_TABLET | Freq: Every day | ORAL | Status: DC
  Administered 2019-10-28 – 2019-10-29 (×2): 10 mg via ORAL
  Filled 2019-10-28 (×2): qty 1

## 2019-10-28 MED ORDER — PIPERACILLIN-TAZOBACTAM 3.375 G IVPB
3.3750 g | Freq: Two times a day (BID) | INTRAVENOUS | Status: DC
  Administered 2019-10-28 – 2019-10-29 (×3): 3.375 g via INTRAVENOUS
  Filled 2019-10-28 (×4): qty 50

## 2019-10-28 MED ORDER — HYDRALAZINE HCL 50 MG PO TABS
100.0000 mg | ORAL_TABLET | Freq: Three times a day (TID) | ORAL | Status: DC
  Administered 2019-10-28 – 2019-10-29 (×4): 100 mg via ORAL
  Filled 2019-10-28 (×6): qty 2

## 2019-10-28 MED ORDER — INSULIN ASPART 100 UNIT/ML ~~LOC~~ SOLN
0.0000 [IU] | Freq: Three times a day (TID) | SUBCUTANEOUS | Status: DC
  Administered 2019-10-28: 3 [IU] via SUBCUTANEOUS
  Administered 2019-10-29: 2 [IU] via SUBCUTANEOUS

## 2019-10-28 MED ORDER — CHLORHEXIDINE GLUCONATE CLOTH 2 % EX PADS
6.0000 | MEDICATED_PAD | Freq: Every day | CUTANEOUS | Status: DC
  Administered 2019-10-29: 6 via TOPICAL

## 2019-10-28 MED ORDER — INSULIN ASPART 100 UNIT/ML ~~LOC~~ SOLN: 0.0000 [IU] | Freq: Every day | SUBCUTANEOUS | Status: DC

## 2019-10-28 MED ORDER — CARVEDILOL 12.5 MG PO TABS
12.5000 mg | ORAL_TABLET | Freq: Two times a day (BID) | ORAL | Status: DC
  Administered 2019-10-28 – 2019-10-29 (×4): 12.5 mg via ORAL
  Filled 2019-10-28 (×4): qty 1

## 2019-10-28 MED ORDER — ACETAMINOPHEN 325 MG PO TABS
650.0000 mg | ORAL_TABLET | Freq: Four times a day (QID) | ORAL | Status: DC | PRN
  Administered 2019-10-29: 650 mg via ORAL
  Filled 2019-10-28: qty 2

## 2019-10-28 NOTE — ED Notes (Signed)
Rep;ort given to rn on 5c 

## 2019-10-28 NOTE — Consult Note (Addendum)
Reason for Consult:left ear infection Referring Physician: er  Zaahir Pickney is an 54 y.o. male.  HPI: hx of left ear pain and has worsened since Friday. He has no hx of OM previously. He has hearing loss in the right ear for years. He has swellingof the pinna and preauricular tissue. He has no vertigo. No facial weakness. He is diabetic  Past Medical History:  Diagnosis Date  . Anemia   . Diabetes mellitus type 2, controlled ([place]) 08/04/2015  . Diabetic retinopathy ([place])   . Diarrhea   . ESRD (end stage renal disease) ([place]) 03/10/2016  . High cholesterol   . Hypertension   . Nausea 08/2017    Past Surgical History:  Procedure Laterality Date  . AV FISTULA PLACEMENT Right 09/04/2015   Procedure: ARTERIOVENOUS (AV) FISTULA CREATION;  Surgeon: Angelia Mould, MD;  Location: [facility];  Service: Vascular;  Laterality: Right;  . AV FISTULA PLACEMENT Right 01/22/2016   Procedure: RIGHT UPPER ARM BRACHIOCEPHALIC ARTERIOVENOUS (AV) FISTULA CREATION;  Surgeon: Angelia Mould, MD;  Location: [facility];  Service: Vascular;  Laterality: Right;  . IR DIALY SHUNT INTRO [place] W/IMG RIGHT Right 12/08/2018  . LIGATION OF ARTERIOVENOUS  FISTULA Right 01/22/2016   Procedure: LIGATION OF RIGHT FOREARM ARTERIOVENOUS  FISTULA;  Surgeon: Angelia Mould, MD;  Location: [facility];  Service: Vascular;  Laterality: Right;  . PERIPHERAL VASCULAR CATHETERIZATION N/A 01/12/2016   Procedure: Fistulagram;  Surgeon: Angelia Mould, MD;  Location: [facility];  Service: Cardiovascular;  Laterality: N/A;  . UMBILICAL HERNIA REPAIR      Family History  Problem Relation Age of Onset  . Hypertension Mother   . Hyperlipidemia Mother   . Diabetes Mellitus II Sister     Social History:  reports that he quit smoking about 16 years ago. He has never used smokeless tobacco. He reports that he does not drink alcohol or use drugs.  Allergies:  Allergies  Allergen Reactions  .  Poractant Alfa Rash and Other (See Comments)    Raises blood pressure  . Pork-Derived Products Rash and Other (See Comments)    Raises blood pressure    Medications: I have reviewed the patient's current medications.  Results for orders placed or performed during the hospital encounter of 10/27/19 (from the past 48 hour(s))  CBG monitoring, ED     Status: Abnormal   Collection Time: 10/28/19  1:21 AM  Result Value Ref Range   Glucose-Capillary 186 (H) 70 - 99 mg/dL    Comment: Glucose reference range applies only to samples taken after fasting for at least 8 hours.  CBC     Status: Abnormal   Collection Time: 10/28/19  1:23 AM  Result Value Ref Range   WBC 10.5 4.0 - 10.5 K/uL   RBC 3.54 (L) 4.22 - 5.81 MIL/uL   Hemoglobin 10.8 (L) 13.0 - 17.0 g/dL   HCT 31.9 (L) 39.0 - 52.0 %   MCV 90.1 80.0 - 100.0 fL   MCH 30.5 26.0 - 34.0 pg   MCHC 33.9 30.0 - 36.0 g/dL   RDW 12.9 11.5 - 15.5 %   Platelets 144 (L) 150 - 400 K/uL   nRBC 0.0 0.0 - 0.2 %    Comment: Performed at [facility], [place] [address] [place],  [address]  Basic metabolic panel     Status: Abnormal   Collection Time: 10/28/19  1:23 AM  Result Value Ref Range   Sodium 137 135 -  145 mmol/L   Potassium 3.8 3.5 - 5.1 mmol/L   Chloride 96 (L) 98 - 111 mmol/L   CO2 26 22 - 32 mmol/L   Glucose, Bld 200 (H) 70 - 99 mg/dL    Comment: Glucose reference range applies only to samples taken after fasting for at least 8 hours.   BUN 21 (H) 6 - 20 mg/dL   Creatinine, Ser 5.64 (H) 0.61 - 1.24 mg/dL   Calcium 8.9 8.9 - 10.3 mg/dL   GFR calc non Af Amer 10 (L) >60 mL/min   GFR calc Af Amer 12 (L) >60 mL/min   Anion gap 15 5 - 15    Comment: Performed at [facility] [address], [place], [place] [address]  SARS CORONAVIRUS 2 (TAT 6-24 HRS) Nasopharyngeal Nasopharyngeal Swab     Status: None   Collection Time: 10/28/19  4:28 AM   Specimen: Nasopharyngeal Swab  Result Value Ref Range   SARS Coronavirus 2  NEGATIVE NEGATIVE    Comment: (NOTE) SARS-CoV-2 target nucleic acids are NOT DETECTED. The SARS-CoV-2 RNA is generally detectable in upper and lower respiratory specimens during the acute phase of infection. Negative results do not preclude SARS-CoV-2 infection, do not rule out co-infections with other pathogens, and should not be used as the sole basis for treatment or other patient management decisions. Negative results must be combined with clinical observations, patient history, and epidemiological information. The expected result is Negative. Fact Sheet for Patients: SugarRoll.be Fact Sheet for Healthcare Providers: https://www.woods-mathews.com/ This test is not yet approved or cleared by the [place] FDA and  has been authorized for detection and/or diagnosis of SARS-CoV-2 by FDA under an Emergency Use Authorization (EUA). This EUA will remain  in effect (meaning this test can be used) for the duration of the COVID-19 declaration under Section 56 4(b)(1) of the Act, 21 U.S.C. section 360bbb-3(b)(1), unless the authorization is terminated or revoked sooner. Performed at [facility], [place] [address] [place], [place] [address]   HIV Antibody (routine testing w rflx)     Status: None   Collection Time: 10/28/19  6:00 AM  Result Value Ref Range   HIV Screen 4th Generation wRfx NON REACTIVE NON REACTIVE    Comment: Performed at [facility] [address] [place], [place] [address]  CBC     Status: Abnormal   Collection Time: 10/28/19  6:00 AM  Result Value Ref Range   WBC 9.5 4.0 - 10.5 K/uL   RBC 3.43 (L) 4.22 - 5.81 MIL/uL   Hemoglobin 10.5 (L) 13.0 - 17.0 g/dL   HCT 31.0 (L) 39.0 - 52.0 %   MCV 90.4 80.0 - 100.0 fL   MCH 30.6 26.0 - 34.0 pg   MCHC 33.9 30.0 - 36.0 g/dL   RDW 12.8 11.5 - 15.5 %   Platelets 146 (L) 150 - 400 K/uL   nRBC 0.0 0.0 - 0.2 %    Comment: Performed at [facility], [place]  [address], [place], [place] [address]  MRSA PCR Screening     Status: None   Collection Time: 10/28/19  6:22 AM   Specimen: Nasopharyngeal  Result Value Ref Range   MRSA by PCR NEGATIVE NEGATIVE    Comment:        The GeneXpert MRSA Assay (FDA approved for NASAL specimens only), is one component of a comprehensive MRSA colonization surveillance program. It is not intended to diagnose MRSA infection nor to guide or  monitor treatment for MRSA infections. Performed at [facility], [place] [address], [place], [place] [address]   Glucose, capillary     Status: Abnormal   Collection Time: 10/28/19  6:43 AM  Result Value Ref Range   Glucose-Capillary 153 (H) 70 - 99 mg/dL    Comment: Glucose reference range applies only to samples taken after fasting for at least 8 hours.    CT Temporal Bones W Contrast  Result Date: 10/28/2019 CLINICAL DATA:  Initial evaluation for acute severe right ear pain for 1 week. Decreased hearing in left ear. EXAM: CT TEMPORAL BONES WITH CONTRAST TECHNIQUE: Axial and coronal plane CT imaging of the petrous temporal bones was performed with thin-collimation image reconstruction after intravenous contrast administration. Multiplanar CT image reconstructions were also generated. CONTRAST:  7mL OMNIPAQUE IOHEXOL 300 MG/ML  SOLN COMPARISON:  None available. FINDINGS: RIGHT TEMPORAL BONE CT: Auricle and pinna within normal limits. Pre and postauricular soft tissues demonstrate no swelling or significant inflammatory changes. Soft tissue density partially fills the right EAC. No associated osseous erosion. Right tympanic membrane not well seen. Right middle ear cavity is largely clear. Ossicular chain intact and normally formed. Tegmen tympani intact. Right mastoid air cells are largely clear. Sigmoid plate intact. Normal opacification of the adjacent sigmoid sinus and jugular bulb the vestibule, semi circular canals, and cochlea within normal limits. Facial nerve canal intact  and bony covered. Carotid canal intact. Negative S tibial ir aqueduct. Normal limits. Inner ear structures including LEFT TEMPORAL BONE CT: Slight asymmetric thickening of the auricle and pinna. Subtle asymmetric swelling with hazy stranding seen within the adjacent pre and postauricular soft tissues. No discrete abscess. Diffuse soft tissue thickening extends into the left EAC which is partially occluded. No associated osseous erosion. Left tympanic membrane not well seen. Soft tissue density partially fills the left middle ear cavity, primarily involving the mesotympanum and hypotympanum. Partial filling of Prussak's space without scutal erosion. Ossicular chain intact and normally formed. Scattered opacification of the left mastoid air cells without erosion or coalescence. Sigmoid plate intact. Normal opacification of the adjacent left sigmoid sinus and jugular bulb. Tegmen tympani intact. Normal IAC. Inner ear structures including the vestibule, semi circular canals, and cochlea within normal limits. Facial nerve canal intact and bony covered. Carotid canal intact. Vestibular aqueduct within normal limits. Visualized portions of the brain within normal limits. Postsurgical changes present at the right globe. Globes and orbital soft tissues otherwise within normal limits. Mild mucoperiosteal thickening noted within the ethmoidal air cells and maxillary sinuses. Visualized paranasal sinuses are otherwise clear. IMPRESSION: 1. Findings suggestive of acute left-sided otomastoiditis with associated otitis externa. No discrete soft tissue abscess. No coalescence or other complicating features. 2. Soft tissue density partially filling and occluding the right EAC, which could reflect cerumen. Correlation with physical exam recommended. Otherwise negative right temporal bone CT with no imaging findings to suggest otomastoiditis or otitis externa. Electronically Signed   By: Jeannine Boga M.D.   On: 10/28/2019  03:31    Review of Systems Blood pressure 125/66, pulse 71, temperature 98.8 F (37.1 C), temperature source Oral, resp. rate 18, height 5\' 4"  (1.626 m), weight 93.4 kg, SpO2 96 %. Physical Exam  Constitutional: He appears well-developed.  HENT:  Nose: Nose normal.  Mouth/Throat: Oropharynx is clear and moist.  There is thickening of the left pinna. No hematoma or fluid collection. Also some preauricular swelling. The canal appears to have a wick in place. No subperiosteal swelling. VII nerve  intact  Eyes: Pupils are equal, round, and reactive to light. Conjunctivae are normal.  Musculoskeletal:     Cervical back: Normal range of motion and neck supple.    Assessment/Plan: Left Otitis externa- he appears to have a wick in the left ear canal. He still has some extension of infection into soft tissue.I reviewed CT scan  The mastoid and middle ear are probably reactive. He should continue Zosyn and ciprodex. I will check again tomorrow to see how he is improving.   Melissa Montane 10/28/2019, 11:03 AM

## 2019-10-28 NOTE — ED Provider Notes (Signed)
[facility] EMERGENCY DEPARTMENT Provider Note   CSN: [id number] Arrival date & time: 10/27/19  2140     History Chief Complaint  Patient presents with  . Otalgia    Eric Glenn is a 54 y.o. male.  Patient with past medical history notable for type 2 diabetes, end-stage renal disease on dialysis, presents to the emergency department with a chief complaint of left ear pain.  He states that he was seen by his doctor for an ear infection.  He was prescribed Augmentin.  He was unable to get the prescription filled.  He reports worsening pain and swelling.  Reports muffled sounds.  Denies any fever.  He denies any recent illnesses.  Denies any recent water/swimming.  The history is provided by the patient. No language interpreter was used.       Past Medical History:  Diagnosis Date  . Anemia   . Diabetes mellitus type 2, controlled ([employer]) 08/04/2015  . Diabetic retinopathy ([place])   . Diarrhea   . ESRD (end stage renal disease) ([place]) 03/10/2016  . High cholesterol   . Hypertension   . Nausea 08/2017    Patient Active Problem List   Diagnosis Date Noted  . Cellulitis of left ear 10/26/2019  . 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 Bilateral leg cramps 08/27/2019  . Chronic idiopathic constipation 05/29/2019  . Callus of R 1st toe 08/23/2018  . ESRD (end stage renal disease) ([place]) 03/10/2016  . Nasal septal deviation 12/10/2015  . Asthma 10/01/2015  . Hypersomnia 10/01/2015  . Obesity 10/01/2015  . Normocytic anemia 08/28/2015  . Hyperlipidemia 08/19/2015  . Sinusitis, chronic 08/19/2015  . Essential hypertension 08/04/2015  . Uncontrolled type 2 diabetes mellitus with complication ([place]) 85/46/2703  . Diabetic retinopathy associated with type 2 diabetes mellitus ([place]) 08/04/2015    Past Surgical History:  Procedure Laterality Date  . AV FISTULA PLACEMENT Right 09/04/2015   Procedure:  ARTERIOVENOUS (AV) FISTULA CREATION;  Surgeon: Angelia Mould, MD;  Location: [facility];  Service: Vascular;  Laterality: Right;  . AV FISTULA PLACEMENT Right 01/22/2016   Procedure: RIGHT UPPER ARM BRACHIOCEPHALIC ARTERIOVENOUS (AV) FISTULA CREATION;  Surgeon: Angelia Mould, MD;  Location: [facility];  Service: Vascular;  Laterality: Right;  . IR DIALY SHUNT INTRO [place] W/IMG RIGHT Right 12/08/2018  . LIGATION OF ARTERIOVENOUS  FISTULA Right 01/22/2016   Procedure: LIGATION OF RIGHT FOREARM ARTERIOVENOUS  FISTULA;  Surgeon: Angelia Mould, MD;  Location: [facility];  Service: Vascular;  Laterality: Right;  . PERIPHERAL VASCULAR CATHETERIZATION N/A 01/12/2016   Procedure: Fistulagram;  Surgeon: Angelia Mould, MD;  Location: [facility];  Service: Cardiovascular;  Laterality: N/A;  . UMBILICAL HERNIA REPAIR         Family History  Problem Relation Age of Onset  . Hypertension Mother   . Hyperlipidemia Mother   . Diabetes Mellitus II Sister     Social History   Tobacco Use  . Smoking status: Former Smoker    Quit date: 10/22/2003    Years since quitting: 16.0  . Smokeless tobacco: Never Used  Substance Use Topics  . Alcohol use: No    Alcohol/week: 0.0 standard drinks  . Drug use: No    Home Medications Prior to Admission medications   Medication Sig Start Date End Date Taking? Authorizing Provider  amLODipine (NORVASC) 10 MG tablet Take 1 tablet (10 mg total) by mouth daily. 05/28/19  [name], MD  amoxicillin-clavulanate (AUGMENTIN) 250-125 MG tablet Take 1 tablet by mouth 2 (two) times daily for 7 days. 10/26/19 11/02/19  [place]omfield, Carley D, DO  atorvastatin (LIPITOR) 40 MG tablet Take 1 tablet (40 mg total) by mouth daily. 05/28/19   [name], MD  carvedilol (COREG) 12.5 MG tablet TAKE 1 TABLET (12.5 MG TOTAL) BY MOUTH 2 (TWO) TIMES DAILY. 10/22/19   Santos-Sanchez, Merlene Morse, MD  Dulaglutide (TRULICITY) 1.5 HW/2.9HB SOPN Inject 1.5 mg  into the skin once a week. 05/28/19   [name], MD  fluticasone (FLONASE) 50 MCG/ACT nasal spray Place 1 spray into both nostrils daily. Patient taking differently: Place 1 spray into both nostrils daily as needed for allergies.  06/22/17 12/08/18  Molt, Bethany, DO  glucose blood (CONTOUR NEXT TEST) test strip Test blood glucose 4 times daily 01/01/19   Welford Roche, MD  hydrALAZINE (APRESOLINE) 100 MG tablet Take 1 tablet (100 mg total) by mouth 3 (three) times daily. 05/28/19   [name], MD  Insulin Glargine (BASAGLAR KWIKPEN) 100 UNIT/ML SOPN Inject 0.25 mLs (25 Units total) into the skin 2 (two) times daily. 05/28/19   [name], MD  Insulin Pen Needle 32G X 4 MM MISC Use 2 needles a day to inject insulin. IM Program 01/22/19   Welford Roche, MD  Lancets MISC Test blood glucose 4 times daily 01/01/19   Welford Roche, MD  multivitamin (RENA-VIT) TABS tablet Take 1 tablet by mouth daily. 08/04/18   Santos-Sanchez, Merlene Morse, MD  oxyCODONE (ROXICODONE) 5 MG immediate release tablet Take 1 tablet (5 mg total) by mouth every 12 (twelve) hours as needed for up to 5 days for severe pain. 10/26/19 10/31/19  Bloomfield, Carley D, DO  senna-docusate (SENOKOT S) 8.6-50 MG tablet Take 1 tablet by mouth daily. 05/28/19   [name], MD  amLODipine (NORVASC) 10 MG tablet Take 1 tablet (10 mg total) by mouth daily. 03/01/18   Santos-Sanchez, Merlene Morse, MD  atorvastatin (LIPITOR) 40 MG tablet Take 1 tablet (40 mg total) by mouth daily. 03/01/18   Welford Roche, MD    Allergies    Poractant alfa and Pork-derived products  Review of Systems   Review of Systems  All other systems reviewed and are negative.   Physical Exam Updated Vital Signs BP (!) 151/72 (BP Location: Left Arm)   Pulse 83   Temp 99.1 F (37.3 C) (Oral)   Resp 17   SpO2 95%   Physical Exam Vitals and nursing note reviewed.  Constitutional:      General: He is not in acute distress.    Appearance:  He is well-developed. He is not ill-appearing.  HENT:     Head: Normocephalic and atraumatic.     Ears:     Comments: Significant swelling of the left auricle, canal is edematous and I am unable to insert speculum for exam, there is mastoid tenderness and preauricular swelling Eyes:     Conjunctiva/sclera: Conjunctivae normal.  Cardiovascular:     Rate and Rhythm: Normal rate.  Pulmonary:     Effort: Pulmonary effort is normal. No respiratory distress.  Abdominal:     General: There is no distension.  Musculoskeletal:     Cervical back: Neck supple.     Comments: Moves all extremities  Skin:    General: Skin is warm and dry.  Neurological:     Mental Status: He is alert and oriented to person, place, and time.  Psychiatric:  Mood and Affect: Mood normal.        Behavior: Behavior normal.     ED Results / Procedures / Treatments   Labs (all labs ordered are listed, but only abnormal results are displayed) Labs Reviewed  CBC - Abnormal; Notable for the following components:      Result Value   RBC 3.54 (*)    Hemoglobin 10.8 (*)    HCT 31.9 (*)    Platelets 144 (*)    All other components within normal limits  BASIC METABOLIC PANEL - Abnormal; Notable for the following components:   Chloride 96 (*)    Glucose, Bld 200 (*)    BUN 21 (*)    Creatinine, Ser 5.64 (*)    GFR calc non Af Amer 10 (*)    GFR calc Af Amer 12 (*)    All other components within normal limits  CBG MONITORING, ED - Abnormal; Notable for the following components:   Glucose-Capillary 186 (*)    All other components within normal limits  SARS CORONAVIRUS 2 (TAT 6-24 HRS)    EKG None  Radiology CT Temporal Bones W Contrast  Result Date: 10/28/2019 CLINICAL DATA:  Initial evaluation for acute severe right ear pain for 1 week. Decreased hearing in left ear. EXAM: CT TEMPORAL BONES WITH CONTRAST TECHNIQUE: Axial and coronal plane CT imaging of the petrous temporal bones was performed with  thin-collimation image reconstruction after intravenous contrast administration. Multiplanar CT image reconstructions were also generated. CONTRAST:  77mL OMNIPAQUE IOHEXOL 300 MG/ML  SOLN COMPARISON:  None available. FINDINGS: RIGHT TEMPORAL BONE CT: Auricle and pinna within normal limits. Pre and postauricular soft tissues demonstrate no swelling or significant inflammatory changes. Soft tissue density partially fills the right EAC. No associated osseous erosion. Right tympanic membrane not well seen. Right middle ear cavity is largely clear. Ossicular chain intact and normally formed. Tegmen tympani intact. Right mastoid air cells are largely clear. Sigmoid plate intact. Normal opacification of the adjacent sigmoid sinus and jugular bulb the vestibule, semi circular canals, and cochlea within normal limits. Facial nerve canal intact and bony covered. Carotid canal intact. Negative S tibial ir aqueduct. Normal limits. Inner ear structures including LEFT TEMPORAL BONE CT: Slight asymmetric thickening of the auricle and pinna. Subtle asymmetric swelling with hazy stranding seen within the adjacent pre and postauricular soft tissues. No discrete abscess. Diffuse soft tissue thickening extends into the left EAC which is partially occluded. No associated osseous erosion. Left tympanic membrane not well seen. Soft tissue density partially fills the left middle ear cavity, primarily involving the mesotympanum and hypotympanum. Partial filling of Prussak's space without scutal erosion. Ossicular chain intact and normally formed. Scattered opacification of the left mastoid air cells without erosion or coalescence. Sigmoid plate intact. Normal opacification of the adjacent left sigmoid sinus and jugular bulb. Tegmen tympani intact. Normal IAC. Inner ear structures including the vestibule, semi circular canals, and cochlea within normal limits. Facial nerve canal intact and bony covered. Carotid canal intact. Vestibular  aqueduct within normal limits. Visualized portions of the brain within normal limits. Postsurgical changes present at the right globe. Globes and orbital soft tissues otherwise within normal limits. Mild mucoperiosteal thickening noted within the ethmoidal air cells and maxillary sinuses. Visualized paranasal sinuses are otherwise clear. IMPRESSION: 1. Findings suggestive of acute left-sided otomastoiditis with associated otitis externa. No discrete soft tissue abscess. No coalescence or other complicating features. 2. Soft tissue density partially filling and occluding the right EAC, which could reflect  cerumen. Correlation with physical exam recommended. Otherwise negative right temporal bone CT with no imaging findings to suggest otomastoiditis or otitis externa. Electronically Signed   By: Jeannine Boga M.D.   On: 10/28/2019 03:31    Procedures Procedures (including critical care time)  Medications Ordered in ED Medications  amoxicillin-clavulanate (AUGMENTIN) 875-125 MG per tablet 1 tablet (has no administration in time range)  ciprofloxacin-dexamethasone (CIPRODEX) 0.3-0.1 % OTIC (EAR) suspension 4 drop (has no administration in time range)    ED Course  I have reviewed the triage vital signs and the nursing notes.  Pertinent labs & imaging results that were available during my care of the patient were reviewed by me and considered in my medical decision making (see chart for details).    MDM Rules/Calculators/A&P                      Patient here with left ear pain.  He has physical exam findings concerning for significant otitis externa.  Malignant otitis externa and mastoiditis remain on the differential.  Will check CT temporal bones for better evaluation.  If CT is reassuring, plan to treat with Augmentin and Ciprodex.  Will need to place an ear wick.   CT is consistent with otomastoiditis.  Likely also has malignant otitis externa.  He is high risk given his multiple  comorbidities.  Will admit to internal medicine.  I discussed case with Dr. Janace Hoard, from ENT, who will consult.  Agrees with plan for Zosyn and Ciprodex otic.  Ear wick placed by me.  4:17 AM Appreciate Dr. Shan Levans, from IMTS, who will admit the patient.  Final Clinical Impression(s) / ED Diagnoses Final diagnoses:  Mastoiditis of left side  Acute malignant otitis externa of left ear    Rx / DC Orders ED Discharge Orders    None       Montine Circle, PA-C 10/28/19 [place], April, MD 10/28/19 0423

## 2019-10-28 NOTE — ED Notes (Signed)
The admitting team is at  The bedside

## 2019-10-28 NOTE — Progress Notes (Signed)
  Subjective:   Eric Glenn was examined and evaluated at bedside this am. He was evaluated with assistance of video interpreter as well as with his daughter who provides additional history. He reports that since admission, his pain has significantly improved although he continues to have difficulty with his hearing.   Objective:    Vital Signs (last 24 hours): Vitals:   10/28/19 0511 10/28/19 0628 10/28/19 0635 10/28/19 0900  BP: (!) 148/76 134/76  125/66  Pulse: 77 78  71  Resp: 18 18    Temp: 99.4 F (37.4 C) 98.8 F (37.1 C)    TempSrc:  Oral    SpO2: 93% 96%    Weight:  93.3 kg 93.4 kg   Height:        Gen: Well-developed, well nourished, NAD HEENT: Bilateral pterygium of eyes, Limited hearing, Erythematous and edematous Left ear with ear wick in place. Significant preauricular swelling CV: RRR, S1, S2 normal, No rubs, no murmurs, no gallops Pulm: CTAB, No rales, no wheezes Abd: Soft, BS+, NTND, No rebound, no guarding Neuro: AAOx3  CBC Latest Ref Rng & Units 10/28/2019 10/28/2019 12/08/2018  WBC 4.0 - 10.5 K/uL 9.5 10.5 8.2  Hemoglobin 13.0 - 17.0 g/dL 10.5(L) 10.8(L) 10.9(L)  Hematocrit 39.0 - 52.0 % 31.0(L) 31.9(L) 30.8(L)  Platelets 150 - 400 K/uL 146(L) 144(L) 148(L)   BMP Latest Ref Rng & Units 10/28/2019 12/08/2018 09/20/2018  Glucose 70 - 99 mg/dL 200(H) 255(H) 279(H)  BUN 6 - 20 mg/dL 21(H) 30(H) 45(H)  Creatinine 0.61 - 1.24 mg/dL 5.64(H) 5.88(H) 6.76(H)  BUN/Creat Ratio 9 - 20 - - 7(L)  Sodium 135 - 145 mmol/L 137 135 135  Potassium 3.5 - 5.1 mmol/L 3.8 3.9 4.1  Chloride 98 - 111 mmol/L 96(L) 94(L) 88(L)  CO2 22 - 32 mmol/L 26 27 23   Calcium 8.9 - 10.3 mg/dL 8.9 8.8(L) 10.1    Assessment/Plan:   Active Problems:   Mastoiditis, acute, left  Eric Glenn is a 54 year old male with PMH significant for ESRD on HD, type 2 diabetes mellitus, hypertension, and hyperlipidemia who presented to [facility] ER on 10/27/2019 for worsening left ear  pain/associated hearting loss and was found to have left-sided otomastoiditis, associated otitis externa on CT.  # Acute left-sided mastoiditis ENT consulted per ER *Follow-up ENT recommendations *Continue ciprofloxacin-dexamethasone drops twice daily + Zosyn per pharmacy consult  # ESRD on HD Scheduled Tue/Thurs/Sat, last on Saturday. No indication for emergent dialysis with K 3.8, BUN 21, creatinine 5.64, and bicarb of 26. *Nephrology consulted this AM for continuation of HD while inpatient  # T2DM: Last hemoglobin C on 08/23/2019 was 10.0.  Home medications of glargine 30 units twice daily + dulaglutide 1.5 mg once weekly every Monday *Continue inpatient regimen of Levemir 15 units daily + novolog 4 units three times daily with meals + moderate SSI TID with meals + QHS  # HTN: BP elevated on admission to 151/72, likely elevated in setting of acute pain *Continue home amlodipine 10 mg daily + hydralazine 100 mg three times daily + carvedilol 12.5 mg twice daily  Diet: NPO in case of surgery DVT Ppx: subqheparin Admit Status: Inpatient Dispo: Anticipated discharge in approximately 2-3 day(s).   [name], MD 10/28/2019, 10:14 AM Pager: [phone]

## 2019-10-28 NOTE — ED Notes (Signed)
To ct

## 2019-10-28 NOTE — Progress Notes (Signed)
Pharmacy Antibiotic Note  Eric Glenn is a 54 y.o. male admitted on 10/27/2019 with mastoiditis.  Pharmacy has been consulted for Zosyn dosing. Pt has ESRD on HD. WBC WNL.   Plan: Zosyn 3.375G IV q12h to be infused over 4 hours Trend WBC, temp F/U infectious work-up   Height: 5\' 4"  (162.6 cm) Weight: 95.3 kg (210 lb) IBW/kg (Calculated) : 59.2  Temp (24hrs), Avg:99.3 F (37.4 C), Min:99.1 F (37.3 C), Max:99.4 F (37.4 C)  Recent Labs  Lab 10/28/19 0123  WBC 10.5  CREATININE 5.64*    Estimated Creatinine Clearance: 15.6 mL/min (A) (by C-G formula based on SCr of 5.64 mg/dL (H)).    Allergies  Allergen Reactions  . Poractant Alfa Rash and Other (See Comments)    Raises blood pressure  . Pork-Derived Products Rash and Other (See Comments)    Raises blood pressure    Narda Bonds, PharmD, BCPS Clinical Pharmacist Phone: [phone]

## 2019-10-28 NOTE — Consult Note (Signed)
Renal Service Consult Note Eric Glenn Eric Glenn 10/28/2019 [place] Requesting Physician:  Dr Philipp Ovens  Reason for Consult:  ESRD pt w/ mastoiditis HPI: The patient is a 54 y.o. year-old with h/o HTN, HL, DM2 w/ complications, ESRD on HD TTS who presented to ED last night for severe L ear pain x 1 week. CT showed acute L otomastoiditis w/ otitis externa, no abscess noted. ENT consulted and pt admitted and started on IV abx w/ eardrops.  Asked to see for ESRD.    Pt seen in room, dtr helped w/ interpreting.  Denies any SOB, cough, CP or abd pain.  No recent HD issues. Access working well.   ROS  denies CP  no joint pain   no HA  no blurry vision  no rash  no diarrhea  no nausea/ vomiting   Past Medical History  Past Medical History:  Diagnosis Date  . Anemia   . Diabetes mellitus type 2, controlled ([place]) 08/04/2015  . Diabetic retinopathy ([place])   . Diarrhea   . ESRD (end stage renal disease) ([place]) 03/10/2016  . High cholesterol   . Hypertension   . Nausea 08/2017   Past Surgical History  Past Surgical History:  Procedure Laterality Date  . AV FISTULA PLACEMENT Right 09/04/2015   Procedure: ARTERIOVENOUS (AV) FISTULA CREATION;  Surgeon: Angelia Mould, MD;  Location: [facility];  Service: Vascular;  Laterality: Right;  . AV FISTULA PLACEMENT Right 01/22/2016   Procedure: RIGHT UPPER ARM BRACHIOCEPHALIC ARTERIOVENOUS (AV) FISTULA CREATION;  Surgeon: Angelia Mould, MD;  Location: [facility];  Service: Vascular;  Laterality: Right;  . IR DIALY SHUNT INTRO [place] W/IMG RIGHT Right 12/08/2018  . LIGATION OF ARTERIOVENOUS  FISTULA Right 01/22/2016   Procedure: LIGATION OF RIGHT FOREARM ARTERIOVENOUS  FISTULA;  Surgeon: Angelia Mould, MD;  Location: [facility];  Service: Vascular;  Laterality: Right;  . PERIPHERAL VASCULAR CATHETERIZATION N/A 01/12/2016   Procedure: Fistulagram;  Surgeon: Angelia Mould, MD;  Location:  [place] [facility];  Service: Cardiovascular;  Laterality: N/A;  . UMBILICAL HERNIA REPAIR     Family History  Family History  Problem Relation Age of Onset  . Hypertension Mother   . Hyperlipidemia Mother   . Diabetes Mellitus II Sister    Social History  reports that he quit smoking about 16 years ago. He has never used smokeless tobacco. He reports that he does not drink alcohol or use drugs. Allergies  Allergies  Allergen Reactions  . Poractant Alfa Rash and Other (See Comments)    Raises blood pressure  . Pork-Derived Products Rash and Other (See Comments)    Raises blood pressure   Home medications Prior to Admission medications   Medication Sig Start Date End Date Taking? Authorizing Provider  acetaminophen (TYLENOL) 325 MG tablet Take 325-650 mg by mouth every 6 (six) hours as needed for mild pain or headache.   Yes [provider]  amLODipine (NORVASC) 10 MG tablet Take 1 tablet (10 mg total) by mouth daily. Patient taking differently: Take 10 mg by mouth at bedtime.  05/28/19  Yes [name], MD  atorvastatin (LIPITOR) 40 MG tablet Take 1 tablet (40 mg total) by mouth daily. Patient taking differently: Take 40 mg by mouth at bedtime.  05/28/19  Yes [name], MD  carvedilol (COREG) 12.5 MG tablet TAKE 1 TABLET (12.5 MG TOTAL) BY MOUTH 2 (TWO) TIMES DAILY. 10/22/19  Yes Welford Roche, MD  Dulaglutide (  TRULICITY) 1.5 ZO/1.0RU SOPN Inject 1.5 mg into the skin once a week. Patient taking differently: Inject 1.5 mg into the skin every Monday.  05/28/19  Yes [name], MD  glucose blood (CONTOUR NEXT TEST) test strip Test blood glucose 4 times daily 01/01/19  Yes Santos-Sanchez, Merlene Morse, MD  hydrALAZINE (APRESOLINE) 100 MG tablet Take 1 tablet (100 mg total) by mouth 3 (three) times daily. 05/28/19  Yes [name], MD  Insulin Glargine (BASAGLAR KWIKPEN) 100 UNIT/ML SOPN Inject 0.25 mLs (25 Units total) into the skin 2 (two) times daily. Patient taking  differently: Inject 30 Units into the skin 2 (two) times daily.  05/28/19  Yes [name], MD  multivitamin (RENA-VIT) TABS tablet Take 1 tablet by mouth daily. 08/04/18  Yes Santos-Sanchez, Merlene Morse, MD  Omega-3 Fatty Acids (FISH OIL) 1000 MG CAPS Take 1,000 mg by mouth in the morning and at bedtime.   Yes [provider]  oxyCODONE (ROXICODONE) 5 MG immediate release tablet Take 1 tablet (5 mg total) by mouth every 12 (twelve) hours as needed for up to 5 days for severe pain. 10/26/19 10/31/19 Yes Bloomfield, Carley D, DO  senna-docusate (SENOKOT S) 8.6-50 MG tablet Take 1 tablet by mouth daily. Patient taking differently: Take 1 tablet by mouth daily as needed for mild constipation.  05/28/19  Yes [name], MD  amoxicillin-clavulanate (AUGMENTIN) 250-125 MG tablet Take 1 tablet by mouth 2 (two) times daily for 7 days. 10/26/19 11/02/19  Bloomfield, Carley D, DO  fluticasone (FLONASE) 50 MCG/ACT nasal spray Place 1 spray into both nostrils daily. Patient taking differently: Place 1 spray into both nostrils daily as needed for allergies.  06/22/17 12/08/18  Molt, Bethany, DO  Insulin Pen Needle 32G X 4 MM MISC Use 2 needles a day to inject insulin. IM Program 01/22/19   Welford Roche, MD  Lancets MISC Test blood glucose 4 times daily 01/01/19   Welford Roche, MD  amLODipine (NORVASC) 10 MG tablet Take 1 tablet (10 mg total) by mouth daily. 03/01/18   Santos-Sanchez, Merlene Morse, MD  atorvastatin (LIPITOR) 40 MG tablet Take 1 tablet (40 mg total) by mouth daily. 03/01/18   Welford Roche, MD     Vitals:   10/28/19 0454 10/28/19 0635 10/28/19 0900 10/28/19 1218  BP: 134/76  125/66 140/75  Pulse: 78  71 71  Resp: 18   18  Temp: 98.8 F (37.1 C)   98.8 F (37.1 C)  TempSrc: Oral   Oral  SpO2: 96%   96%  Weight: 93.3 kg 93.4 kg    Height:       Exam Gen alert, no distress, calm No rash, cyanosis or gangrene Sclera anicteric, throat clear L ear and surroundings tissues  w/ mild-mod swelling  No jvd or bruits Chest clear bilat to bases no rales, wheezing or bronchial BS RRR no MRG Abd soft ntnd no mass or ascites +bs GU normal male  MS no joint effusions or deformity Ext no LE edema, no wounds or ulcers Neuro is alert, Ox 3 , nf RUA AVF+bruit    Home meds:  - coreg 12.5 bid/ hydralazine 100 tid/ norvasc 10 qd  - lantus insulin 09W sq bid/ trulicity 1.5mg  sq weekly  - lipitor 40 hs/ oxy IR prn  - prn's/ vitamins/ supplements    Outpt HD: TTS [facility]   4h  94kg  2/2 bath  Hep 4000  RUA AVF  - venofer 50 /wk  - calc 0.5 ug tiw  Assessment/ Plan: 1. L otomastoiditis - abx per primary team 2. ESRD - on HD TTS. No indication for acute HD. Next HD Tuesday 3. DM insulin dependent 4. HTN - cont meds 5.  Anemia ckd - Hb 10.5, follow    Rob Harlie Ragle  MD 10/28/2019, 4:44 PM  Recent Labs  Lab 10/28/19 0123 10/28/19 0600  WBC 10.5 9.5  HGB 10.8* 10.5*   Recent Labs  Lab 10/28/19 0123  K 3.8  BUN 21*  CREATININE 5.64*  CALCIUM 8.9

## 2019-10-28 NOTE — H&P (Signed)
Date: 10/28/2019               Patient Name:  Eric Glenn MRN: [id number]  DOB: 1966-07-03 Age / Sex: 54 y.o., male   PCP: Welford Roche, MD         Medical Service: Internal Medicine Teaching Service         Attending Physician: Dr. Velna Ochs, MD    First Contact: Dr. Sheppard Coil Pager: [phone]  Second Contact: Dr. Myrtie Hawk Pager: [phone]       After Hours (After 5p/  First Contact Pager: [phone]  weekends / holidays): Second Contact Pager: [phone]   Chief Complaint: ear pain  History of Present Illness:  Eric Glenn is a 54 year old M with significant PMH of ESRD on HD, type II diabetes, HTN, and HLD, who presented to the emergency room for L ear pain. Of note, pt was seen in the outpatient clinic 1 day prior for L ear pain that was attributed to external cellulitis or potential dental infection. He was prescribed 7 day course of Augmentin and oxycodone for pain. Since then, pt states he was able to pick up and take oxycodone for pain, but the Augmentin would not be ready until Monday as the pt gets his medications through the Outpatient Pharmacy.   Pt's niece at the bedside and reports pt having progressive hearing loss on the L side with associated outer ear swelling. She believe the pt to also have some hearing deficits on the R side too over the last day. Pt endorses ear pain, decreased hearing, headaches, and a chronic cough with sputum production. Denies fevers/chills, dizziness/lightheadedness, neck rigidity/pain, trouble swallowing, post-nasal drip, nausea/vomiting, or abdominal pain.  In the ED, pt was afebrile, HR 83, BP 151/72, RR 17, and O2 saturation 95% on room air. CT of the temporal bones significant for acute L sided otomastoiditis with otitis externa, no abscess identified, and possible cerumen partially filing the R external auditory canal without findings suggesting otomastoiditis or otitis externa on the R. Bilateral ear wicks  placed. Pt given otic ciprodex to both ears, zosyn, and one time azithromycin dose. Lab work remarkable for WBC 10.5, stable anemia with Hgb 10.8, Cr 5.64, BUN 21, K 3.8, and glc 200. ENT consulted and IMTS called for admission.  Due to language barrier, an interpreter, Eric Glenn and Eric Glenn [phone], was present during the history-taking and subsequent discussion (and for part of the physical exam) with this patient.  Meds: No current facility-administered medications on file prior to encounter.   Current Outpatient Medications on File Prior to Encounter  Medication Sig Dispense Refill  . amLODipine (NORVASC) 10 MG tablet Take 1 tablet (10 mg total) by mouth daily. 90 tablet 3  . amoxicillin-clavulanate (AUGMENTIN) 250-125 MG tablet Take 1 tablet by mouth 2 (two) times daily for 7 days. 14 tablet 0  . atorvastatin (LIPITOR) 40 MG tablet Take 1 tablet (40 mg total) by mouth daily. 90 tablet 3  . carvedilol (COREG) 12.5 MG tablet TAKE 1 TABLET (12.5 MG TOTAL) BY MOUTH 2 (TWO) TIMES DAILY. 60 tablet 3  . Dulaglutide (TRULICITY) 1.5 YT/0.3TW SOPN Inject 1.5 mg into the skin once a week. 2 mL 3  . fluticasone (FLONASE) 50 MCG/ACT nasal spray Place 1 spray into both nostrils daily. (Patient taking differently: Place 1 spray into both nostrils daily as needed for allergies. ) 16 g 2  . glucose blood (CONTOUR NEXT TEST) test strip Test blood glucose 4 times daily 100  each 11  . hydrALAZINE (APRESOLINE) 100 MG tablet Take 1 tablet (100 mg total) by mouth 3 (three) times daily. 90 tablet 10  . Insulin Glargine (BASAGLAR KWIKPEN) 100 UNIT/ML SOPN Inject 0.25 mLs (25 Units total) into the skin 2 (two) times daily. 20 pen 6  . Insulin Pen Needle 32G X 4 MM MISC Use 2 needles a day to inject insulin. IM Program 100 each 12  . Lancets MISC Test blood glucose 4 times daily 100 each 11  . multivitamin (RENA-VIT) TABS tablet Take 1 tablet by mouth daily. 60 tablet 5  . oxyCODONE (ROXICODONE) 5 MG immediate  release tablet Take 1 tablet (5 mg total) by mouth every 12 (twelve) hours as needed for up to 5 days for severe pain. 10 tablet 0  . senna-docusate (SENOKOT S) 8.6-50 MG tablet Take 1 tablet by mouth daily. 30 tablet 0  . [DISCONTINUED] amLODipine (NORVASC) 10 MG tablet Take 1 tablet (10 mg total) by mouth daily. 90 tablet 2  . [DISCONTINUED] atorvastatin (LIPITOR) 40 MG tablet Take 1 tablet (40 mg total) by mouth daily. 90 tablet 2   Allergies: Allergies as of 10/27/2019 - Review Complete 10/27/2019  Allergen Reaction Noted  . Poractant alfa Rash and Other (See Comments) 01/07/2016  . Pork-derived products Rash and Other (See Comments) 08/03/2015   Past Medical History:  Diagnosis Date  . Anemia   . Diabetes mellitus type 2, controlled ([place]) 08/04/2015  . Diabetic retinopathy ([place])   . Diarrhea   . ESRD (end stage renal disease) ([place]) 03/10/2016  . High cholesterol   . Hypertension   . Nausea 08/2017   Family History:  Family History  Problem Relation Age of Onset  . Hypertension Mother   . Hyperlipidemia Mother   . Diabetes Mellitus II Sister    Social History:  Social History   Tobacco Use  . Smoking status: Former Smoker    Quit date: 10/22/2003    Years since quitting: 16.0  . Smokeless tobacco: Never Used  Substance Use Topics  . Alcohol use: No    Alcohol/week: 0.0 standard drinks  . Drug use: No   Review of Systems: Review of Systems  Constitutional: Negative for chills and fever.  HENT: Positive for hearing loss. Negative for congestion, ear discharge, ear pain and sore throat.   Eyes: Negative for pain.  Respiratory: Positive for cough (chronic) and sputum production (chronic). Negative for shortness of breath and wheezing.   Cardiovascular: Negative for chest pain and palpitations.  Gastrointestinal: Negative for abdominal pain, nausea and vomiting.  Musculoskeletal: Negative for neck pain.  Skin:       L ear swelling and erythema  Neurological: Positive  for headaches. Negative for dizziness.   Physical Exam: Blood pressure (!) 151/72, pulse 83, temperature 99.1 F (37.3 C), temperature source Oral, resp. rate 17, SpO2 95 %. Physical Exam Vitals and nursing note reviewed.  Constitutional:      General: He is not in acute distress.    Appearance: Normal appearance. He is obese. He is not ill-appearing.  HENT:     Head: Normocephalic and atraumatic.     Right Ear: Decreased hearing noted.     Left Ear: Decreased hearing noted. Swelling (L auricle and preauricular area) present.     Ears:     Comments: Bilateral ear wicks in place. Eyes:     Extraocular Movements: Extraocular movements intact.  Cardiovascular:     Rate and Rhythm: Normal rate and regular rhythm.  Heart sounds: Normal heart sounds. No murmur. No friction rub. No gallop.   Pulmonary:     Effort: Pulmonary effort is normal. No respiratory distress.     Breath sounds: No wheezing, rhonchi or rales.  Abdominal:     General: Abdomen is flat. Bowel sounds are normal. There is no distension.     Palpations: Abdomen is soft.     Tenderness: There is no abdominal tenderness.  Musculoskeletal:     Cervical back: Normal range of motion. No rigidity.     Right lower leg: No edema.     Left lower leg: No edema.  Skin:    General: Skin is warm and dry.     Findings: Erythema (over L ear) present.  Neurological:     Mental Status: He is alert and oriented to person, place, and time.     Comments: Moving all extremities spontaneously  Psychiatric:        Mood and Affect: Mood normal.     CT Temporal Bones w/ contrast IMPRESSION: 1. Findings suggestive of acute left-sided otomastoiditis with associated otitis externa. No discrete soft tissue abscess. No coalescence or other complicating features. 2. Soft tissue density partially filling and occluding the right EAC, which could reflect cerumen. Correlation with physical exam recommended. Otherwise negative right temporal  bone CT with no imaging findings to suggest otomastoiditis or otitis externa.  Assessment & Plan by Problem: Active Problems:   Mastoiditis, acute, left  Mr. Mcclure is a 54 year old M with significant PMH of ESRD on HD, type II diabetes, HTN, and HLD, who presented to the emergency room for worsening L ear pain and associated hearing loss with CT imaging consistent with left sided otomastoiditis and associated otitis externa.  Acute left-sided mastoiditis ENT consulted in the ED - follow-up recommendations - continue ciprodex drops BID and zosyn 3.375g BID - pain control with dilaudid 2mg  q6h PRN and tylenol 650mg  q6h PRN  ESRD on HD Labs stable - K 3.8, BUN 21, Cr 5.64 and bicarb 26.  - consult nephrology in AM for inpatient HD  Type II diabetes Last A1c on 08/27/2019 was 10.0. Home medication regimen of basiglar 30u BID per niece and trulicity 1.5mg  once a week. Glucose on admission 200. - levemir 15u daily, novolog 4u TID with meals, and moderate SSI with meals + qhs - CBGs before meals and at bedtime  HTN BP elevated on admission to 151/72, likely secondary to ongoing pain.  - continue home amlodipine 10mg  daily, hydralazine 100mg  TID, and carvedilol 12.5mg  daily  Diet - NPO for potential ENT procedure Fluids - none DVT ppx - heparin 5,000u subQ q8h CODE STATUS - FULL CODE  Dispo: Admit patient to Inpatient with expected length of stay greater than 2 midnights.  Signed: Ladona Horns, MD 10/28/2019, 4:49 AM  Pager: [phone]

## 2019-10-29 DIAGNOSIS — Z992 Dependence on renal dialysis: Secondary | ICD-10-CM

## 2019-10-29 DIAGNOSIS — H6092 Unspecified otitis externa, left ear: Secondary | ICD-10-CM

## 2019-10-29 DIAGNOSIS — E785 Hyperlipidemia, unspecified: Secondary | ICD-10-CM

## 2019-10-29 DIAGNOSIS — I120 Hypertensive chronic kidney disease with stage 5 chronic kidney disease or end stage renal disease: Secondary | ICD-10-CM

## 2019-10-29 DIAGNOSIS — N186 End stage renal disease: Secondary | ICD-10-CM

## 2019-10-29 DIAGNOSIS — H7092 Unspecified mastoiditis, left ear: Secondary | ICD-10-CM

## 2019-10-29 DIAGNOSIS — E1122 Type 2 diabetes mellitus with diabetic chronic kidney disease: Secondary | ICD-10-CM

## 2019-10-29 DIAGNOSIS — Z79899 Other long term (current) drug therapy: Secondary | ICD-10-CM

## 2019-10-29 DIAGNOSIS — Z794 Long term (current) use of insulin: Secondary | ICD-10-CM

## 2019-10-29 LAB — RENAL FUNCTION PANEL
Albumin: 3.5 g/dL (ref 3.5–5.0)
Anion gap: 18 — ABNORMAL HIGH (ref 5–15)
BUN: 40 mg/dL — ABNORMAL HIGH (ref 6–20)
CO2: 24 mmol/L (ref 22–32)
Calcium: 8.6 mg/dL — ABNORMAL LOW (ref 8.9–10.3)
Chloride: 95 mmol/L — ABNORMAL LOW (ref 98–111)
Creatinine, Ser: 8.98 mg/dL — ABNORMAL HIGH (ref 0.61–1.24)
GFR calc Af Amer: 7 mL/min — ABNORMAL LOW (ref >60)
GFR calc non Af Amer: 6 mL/min — ABNORMAL LOW (ref >60)
Glucose, Bld: 120 mg/dL — ABNORMAL HIGH (ref 70–99)
Phosphorus: 6.3 mg/dL — ABNORMAL HIGH (ref 2.5–4.6)
Potassium: 4.2 mmol/L (ref 3.5–5.1)
Sodium: 137 mmol/L (ref 135–145)

## 2019-10-29 LAB — CBC
HCT: 31.7 % — ABNORMAL LOW (ref 39.0–52.0)
Hemoglobin: 10.6 g/dL — ABNORMAL LOW (ref 13.0–17.0)
MCH: 30.5 pg (ref 26.0–34.0)
MCHC: 33.4 g/dL (ref 30.0–36.0)
MCV: 91.4 fL (ref 80.0–100.0)
Platelets: 161 10*3/uL (ref 150–400)
RBC: 3.47 MIL/uL — ABNORMAL LOW (ref 4.22–5.81)
RDW: 12.9 % (ref 11.5–15.5)
WBC: 8.9 10*3/uL (ref 4.0–10.5)
nRBC: 0 % (ref 0.0–0.2)

## 2019-10-29 LAB — GLUCOSE, CAPILLARY
Glucose-Capillary: 112 mg/dL — ABNORMAL HIGH (ref 70–99)
Glucose-Capillary: 135 mg/dL — ABNORMAL HIGH (ref 70–99)
Glucose-Capillary: 90 mg/dL (ref 70–99)

## 2019-10-29 MED ORDER — FERRIC CITRATE 1 GM 210 MG(FE) PO TABS
840.0000 mg | ORAL_TABLET | Freq: Three times a day (TID) | ORAL | Status: DC
  Administered 2019-10-29: 840 mg via ORAL
  Filled 2019-10-29 (×3): qty 4

## 2019-10-29 MED ORDER — AMOXICILLIN-POT CLAVULANATE 250-125 MG PO TABS: 1.0000 | ORAL_TABLET | Freq: Two times a day (BID) | ORAL | 0 refills | Status: AC

## 2019-10-29 NOTE — Progress Notes (Addendum)
[facility] Progress Note   Subjective: Seen on HD ordered off schedule. He is running even. No C/Os.   Objective Vitals:   10/28/19 1803 10/28/19 2217 10/29/19 0024 10/29/19 0621  BP: 128/71 (!) 143/71 134/71 (!) 149/76  Pulse: 75 73 76 72  Resp: 16 16 18 18   Temp: 98.4 F (36.9 C) 98.8 F (37.1 C) 98.8 F (37.1 C) 99.1 F (37.3 C)  TempSrc: Oral Oral Oral Oral  SpO2: 97% 97% 98% 97%  Weight:      Height:       Physical Exam General: WN,WD male in NAD Heart: S1,S2 RRR Lungs: CTAB Abdomen: Active BS Extremities:No LE edema Dialysis Access: R AVF cannulated at present   Additional Objective Labs: Basic Metabolic Panel: Recent Labs  Lab 10/28/19 0123 10/29/19 0604  NA 137 137  K 3.8 4.2  CL 96* 95*  CO2 26 24  GLUCOSE 200* 120*  BUN 21* 40*  CREATININE 5.64* 8.98*  CALCIUM 8.9 8.6*  PHOS  --  6.3*   Liver Function Tests: Recent Labs  Lab 10/29/19 0604  ALBUMIN 3.5   No results for input(s): LIPASE, AMYLASE in the last 168 hours. CBC: Recent Labs  Lab 10/28/19 0123 10/28/19 0600 10/29/19 0604  WBC 10.5 9.5 8.9  HGB 10.8* 10.5* 10.6*  HCT 31.9* 31.0* 31.7*  MCV 90.1 90.4 91.4  PLT 144* 146* 161   Blood Culture    Component Value Date/Time   SDES BLOOD LEFT HAND 02/26/2016 1345   SPECREQUEST BOTTLES DRAWN AEROBIC AND ANAEROBIC 5CC 02/26/2016 1345   CULT NO GROWTH 5 DAYS 02/26/2016 1345   REPTSTATUS 03/02/2016 FINAL 02/26/2016 1345    Cardiac Enzymes: No results for input(s): CKTOTAL, CKMB, CKMBINDEX, TROPONINI in the last 168 hours. CBG: Recent Labs  Lab 10/28/19 1108 10/28/19 1617 10/28/19 2204 10/29/19 0613 10/29/19 1048  GLUCAP 119* 110* 150* 112* 135*   Iron Studies: No results for input(s): IRON, TIBC, TRANSFERRIN, FERRITIN in the last 72 hours. @lablastinr3 @ Studies/Results: CT Temporal Bones W Contrast  Result Date: 10/28/2019 CLINICAL DATA:  Initial evaluation for acute severe right ear pain for 1 week.  Decreased hearing in left ear. EXAM: CT TEMPORAL BONES WITH CONTRAST TECHNIQUE: Axial and coronal plane CT imaging of the petrous temporal bones was performed with thin-collimation image reconstruction after intravenous contrast administration. Multiplanar CT image reconstructions were also generated. CONTRAST:  78mL OMNIPAQUE IOHEXOL 300 MG/ML  SOLN COMPARISON:  None available. FINDINGS: RIGHT TEMPORAL BONE CT: Auricle and pinna within normal limits. Pre and postauricular soft tissues demonstrate no swelling or significant inflammatory changes. Soft tissue density partially fills the right EAC. No associated osseous erosion. Right tympanic membrane not well seen. Right middle ear cavity is largely clear. Ossicular chain intact and normally formed. Tegmen tympani intact. Right mastoid air cells are largely clear. Sigmoid plate intact. Normal opacification of the adjacent sigmoid sinus and jugular bulb the vestibule, semi circular canals, and cochlea within normal limits. Facial nerve canal intact and bony covered. Carotid canal intact. Negative S tibial ir aqueduct. Normal limits. Inner ear structures including LEFT TEMPORAL BONE CT: Slight asymmetric thickening of the auricle and pinna. Subtle asymmetric swelling with hazy stranding seen within the adjacent pre and postauricular soft tissues. No discrete abscess. Diffuse soft tissue thickening extends into the left EAC which is partially occluded. No associated osseous erosion. Left tympanic membrane not well seen. Soft tissue density partially fills the left middle ear cavity, primarily involving the mesotympanum and hypotympanum. Partial filling  of Prussak's space without scutal erosion. Ossicular chain intact and normally formed. Scattered opacification of the left mastoid air cells without erosion or coalescence. Sigmoid plate intact. Normal opacification of the adjacent left sigmoid sinus and jugular bulb. Tegmen tympani intact. Normal IAC. Inner ear  structures including the vestibule, semi circular canals, and cochlea within normal limits. Facial nerve canal intact and bony covered. Carotid canal intact. Vestibular aqueduct within normal limits. Visualized portions of the brain within normal limits. Postsurgical changes present at the right globe. Globes and orbital soft tissues otherwise within normal limits. Mild mucoperiosteal thickening noted within the ethmoidal air cells and maxillary sinuses. Visualized paranasal sinuses are otherwise clear. IMPRESSION: 1. Findings suggestive of acute left-sided otomastoiditis with associated otitis externa. No discrete soft tissue abscess. No coalescence or other complicating features. 2. Soft tissue density partially filling and occluding the right EAC, which could reflect cerumen. Correlation with physical exam recommended. Otherwise negative right temporal bone CT with no imaging findings to suggest otomastoiditis or otitis externa. Electronically Signed   By: Jeannine Boga M.D.   On: 10/28/2019 03:31   Medications: . piperacillin-tazobactam (ZOSYN)  IV 3.375 g (10/29/19 0912)   . amLODipine  10 mg Oral Daily  . carvedilol  12.5 mg Oral BID WC  . Chlorhexidine Gluconate Cloth  6 each Topical Q0600  . ciprofloxacin-dexamethasone  4 drop Both EARS BID  . heparin  5,000 Units Subcutaneous Q8H  . hydrALAZINE  100 mg Oral Q8H  . insulin aspart  0-15 Units Subcutaneous TID WC  . insulin aspart  0-5 Units Subcutaneous QHS  . insulin aspart  4 Units Subcutaneous TID WC  . insulin detemir  15 Units Subcutaneous Daily     Outpt HD Orders: [facility] T,Th,S 4hr 180NRe 400/800 94 kg 2.0 K/2.0 Ca UFP 4 AVF  -Heparin 4000 units IV TIW -Calcitriol 1.5 mcg PO TIW -Venofer 50 mg IV weekly   Assessment/ Plan: 1. L otomastoiditis - abx per primary team. Otolaryngology consulted.  2. ESRD - on HD TTS. Next HD was supposed to be 10/30/2019 but ordered for today. Next tx 04/15.  3. Volume/HTN: BP volume well  controlled. Pre wt 95 kg. Attempting UFG 1 liter.    4.  Anemia ckd - Hb 10.5, follow HGB  5.  CKD BMD-continue binders, VDRA  6. Nutrition- Albumin 3.5. renal/carb mod diet.  7.  DM per primary  Rita H. Brown NP-C 10/29/2019, 12:12 PM  [employer] [phone]  I have seen and examined this patient and agree with plan and assessment in the above note with renal recommendations/intervention highlighted.  He is currently on HD and tolerating it well.  Off schedule but will get back on schedule 11/01/19.   Eric A Ridley Schewe,MD 10/29/2019 2:07 PM

## 2019-10-29 NOTE — Progress Notes (Signed)
Subjective/Chief Complaint: Feels slightly better but hurting in the right ear   Objective: Vital signs in last 24 hours: Temp:  [98.4 F (36.9 C)-99.1 F (37.3 C)] 98.5 F (36.9 C) (04/12 1140) Pulse Rate:  [66-83] 66 (04/12 1430) Resp:  [16-18] 18 (04/12 1149) BP: (106-149)/(60-76) 106/70 (04/12 1430) SpO2:  [97 %-98 %] 98 % (04/12 1140) Weight:  [95 kg] 95 kg (04/12 1140) Last BM Date: 10/27/19  Intake/Output from previous day: 04/11 0701 - 04/12 0700 In: 656.9 [P.O.:600; IV Piggyback:56.9] Out: -  Intake/Output this shift: No intake/output data recorded.  the pinna is decreased in swelling. no pain with movement of the pinna on the left. He has pain in the preauricular area on the right. The canal has dry wax. The left ear has swelling and mild exudate. ther is no wick. no swelling postauricyularly  Lab Results:  Recent Labs    10/28/19 0600 10/29/19 0604  WBC 9.5 8.9  HGB 10.5* 10.6*  HCT 31.0* 31.7*  PLT 146* 161   BMET Recent Labs    10/28/19 0123 10/29/19 0604  NA 137 137  K 3.8 4.2  CL 96* 95*  CO2 26 24  GLUCOSE 200* 120*  BUN 21* 40*  CREATININE 5.64* 8.98*  CALCIUM 8.9 8.6*   PT/INR No results for input(s): LABPROT, INR in the last 72 hours. ABG No results for input(s): PHART, HCO3 in the last 72 hours.  Invalid input(s): PCO2, PO2  Studies/Results: CT Temporal Bones W Contrast  Result Date: 10/28/2019 CLINICAL DATA:  Initial evaluation for acute severe right ear pain for 1 week. Decreased hearing in left ear. EXAM: CT TEMPORAL BONES WITH CONTRAST TECHNIQUE: Axial and coronal plane CT imaging of the petrous temporal bones was performed with thin-collimation image reconstruction after intravenous contrast administration. Multiplanar CT image reconstructions were also generated. CONTRAST:  61mL OMNIPAQUE IOHEXOL 300 MG/ML  SOLN COMPARISON:  None available. FINDINGS: RIGHT TEMPORAL BONE CT: Auricle and pinna within normal limits. Pre and  postauricular soft tissues demonstrate no swelling or significant inflammatory changes. Soft tissue density partially fills the right EAC. No associated osseous erosion. Right tympanic membrane not well seen. Right middle ear cavity is largely clear. Ossicular chain intact and normally formed. Tegmen tympani intact. Right mastoid air cells are largely clear. Sigmoid plate intact. Normal opacification of the adjacent sigmoid sinus and jugular bulb the vestibule, semi circular canals, and cochlea within normal limits. Facial nerve canal intact and bony covered. Carotid canal intact. Negative S tibial ir aqueduct. Normal limits. Inner ear structures including LEFT TEMPORAL BONE CT: Slight asymmetric thickening of the auricle and pinna. Subtle asymmetric swelling with hazy stranding seen within the adjacent pre and postauricular soft tissues. No discrete abscess. Diffuse soft tissue thickening extends into the left EAC which is partially occluded. No associated osseous erosion. Left tympanic membrane not well seen. Soft tissue density partially fills the left middle ear cavity, primarily involving the mesotympanum and hypotympanum. Partial filling of Prussak's space without scutal erosion. Ossicular chain intact and normally formed. Scattered opacification of the left mastoid air cells without erosion or coalescence. Sigmoid plate intact. Normal opacification of the adjacent left sigmoid sinus and jugular bulb. Tegmen tympani intact. Normal IAC. Inner ear structures including the vestibule, semi circular canals, and cochlea within normal limits. Facial nerve canal intact and bony covered. Carotid canal intact. Vestibular aqueduct within normal limits. Visualized portions of the brain within normal limits. Postsurgical changes present at the right globe. Globes and orbital soft  tissues otherwise within normal limits. Mild mucoperiosteal thickening noted within the ethmoidal air cells and maxillary sinuses. Visualized  paranasal sinuses are otherwise clear. IMPRESSION: 1. Findings suggestive of acute left-sided otomastoiditis with associated otitis externa. No discrete soft tissue abscess. No coalescence or other complicating features. 2. Soft tissue density partially filling and occluding the right EAC, which could reflect cerumen. Correlation with physical exam recommended. Otherwise negative right temporal bone CT with no imaging findings to suggest otomastoiditis or otitis externa. Electronically Signed   By: Jeannine Boga M.D.   On: 10/28/2019 03:31    Anti-infectives: Anti-infectives (From admission, onward)   Start     Dose/Rate Route Frequency Ordered Stop   10/28/19 0415  piperacillin-tazobactam (ZOSYN) IVPB 3.375 g     3.375 g 12.5 mL/hr over 240 Minutes Intravenous Every 12 hours 10/28/19 0412     10/28/19 0000  amoxicillin-clavulanate (AUGMENTIN) 875-125 MG per tablet 1 tablet     1 tablet Oral  Once 10/27/19 2346 10/28/19 0248      Assessment/Plan: s/p * No surgery found * i think he could be switched to po and discharged to follow up in the office . Lets arrange that for Thursday.   LOS: 1 day    Melissa Montane 10/29/2019

## 2019-10-29 NOTE — Progress Notes (Signed)
Subjective:  Eric Glenn is a 54 y.o. with PMH of ESRD on HD, type 2 diabetes mellitus, hypertension, and hyperlipidemia  admitted for otomastoiditis on hospital day 1  [name] was examined and evaluated at bedside with his niece present. He states he is having pain on both ears early this morning and had to request pain meds. He continues to endorse hearing impairment. He mentions that his right ear appears to have started to swell with worsening pain especially with jaw movement although his left ear has improvement in his pain and swelling.  Objective:  Vital signs in last 24 hours: Vitals:   10/28/19 1803 10/28/19 2217 10/29/19 0024 10/29/19 0621  BP: 128/71 (!) 143/71 134/71 (!) 149/76  Pulse: 75 73 76 72  Resp: 16 16 18 18   Temp: 98.4 F (36.9 C) 98.8 F (37.1 C) 98.8 F (37.1 C) 99.1 F (37.3 C)  TempSrc: Oral Oral Oral Oral  SpO2: 97% 97% 98% 97%  Weight:      Height:       CBC Latest Ref Rng & Units 10/29/2019 10/28/2019 10/28/2019  WBC 4.0 - 10.5 K/uL 8.9 9.5 10.5  Hemoglobin 13.0 - 17.0 g/dL 10.6(L) 10.5(L) 10.8(L)  Hematocrit 39.0 - 52.0 % 31.7(L) 31.0(L) 31.9(L)  Platelets 150 - 400 K/uL 161 146(L) 144(L)   BMP Latest Ref Rng & Units 10/28/2019 12/08/2018 09/20/2018  Glucose 70 - 99 mg/dL 200(H) 255(H) 279(H)  BUN 6 - 20 mg/dL 21(H) 30(H) 45(H)  Creatinine 0.61 - 1.24 mg/dL 5.64(H) 5.88(H) 6.76(H)  BUN/Creat Ratio 9 - 20 - - 7(L)  Sodium 135 - 145 mmol/L 137 135 135  Potassium 3.5 - 5.1 mmol/L 3.8 3.9 4.1  Chloride 98 - 111 mmol/L 96(L) 94(L) 88(L)  CO2 22 - 32 mmol/L 26 27 23   Calcium 8.9 - 10.3 mg/dL 8.9 8.8(L) 10.1    General: well nourished, well developed  HEENT: Normocephalic, atraumatic , Blind in right eye, can see shadow with left eye, bilateral preauricular swelling,. Cardiovascular: Normal rate, regular rhythm.  No murmurs, rubs, or gallops Pulmonary : Effort normal, breath sounds normal. No wheezes, rales, or rhonchi Abdominal:  soft, nontender,  bowel sounds present Musculoskeletal: no swelling , peripheral pulses intact  Skin: Warm, dry , no bruising, erythema, or rash   Assessment/Plan:  Active Problems:   Mastoiditis, acute, left   [name] is a 54 year old male with PMH significant for ESRD on HD, type 2 diabetes mellitus, hypertension, and hyperlipidemia who presented to [facility] ER on 10/27/2019 for worsening left ear pain/associated hearting loss and was found to have left-sided otomastoiditis, associated otitis externa on CT.  # Acute left-sided mastoiditis ENT consulted per ER -Follow-up ENT recommendations -Continue ciprofloxacin-dexamethasone drops twice daily + Zosyn per pharmacy consult  # ESRD on HD Scheduled Tue/Thurs/Sat, last on Saturday. No indication for emergent dialysis with K 4.2, BUN 40, creatinine 8.98, and bicarb of 24. - Nephrology consulted, appreciate recomendations  # T2DM: Last hemoglobin C on 08/23/2019 was 10.0.  Home medications of glargine 30 units twice daily + dulaglutide 1.5 mg once weekly every Monday. BG at goal.  -Continue inpatient regimen of Levemir 15 units daily + novolog 4 units three times daily with meals + moderate SSI TID with meals + QHS  # HTN: BP elevated on admission to 151/72, likely elevated in setting of acute pain -Continue home amlodipine 10 mg daily + hydralazine 100 mg three times daily + carvedilol 12.5 mg twice daily  Diet: renal/carb modified  DVT Ppx: subqheparin Admit Status: Inpatient Dispo: Anticipated discharge in approximately 2-3 day(s).   Tamsen Snider, MD PGY1

## 2019-10-29 NOTE — Discharge Instructions (Signed)
Eric Glenn You were treated for mastoiditis with IV antibiotics.  The ear nose and throat doctor has recommended continued oral antibiotics and follow-up with him in clinic on Thursday.  You received dialysis on 4/12 and the nephrologist recommended resuming normal scheduled dialysis on 4/15.  You may want to call and update your dialysis center so the know you will not be there.

## 2019-10-30 ENCOUNTER — Telehealth: Payer: Self-pay | Admitting: Nurse Practitioner

## 2019-10-30 NOTE — Telephone Encounter (Signed)
Transition of care contact from inpatient facility  Date of Discharge: 10/29/2019 Date of Contact: 10/30/2019 Method of contact: Phone  Attempted to contact patient to discuss transition of care from inpatient admission. Patient did not answer the phone. Message was left on the patient's voicemail with call back number [phone].

## 2019-10-30 NOTE — Discharge Summary (Signed)
Name: Eric Glenn MRN: [id number] DOB: [birth year] 54 y.o. PCP: Eric Roche, MD  Date of Admission: 10/27/2019  9:47 PM Date of Discharge: 10/29/2019 Attending Physician: Eric Glenn  Discharge Diagnosis: 1.  Acute left-sided mastoiditis  Discharge Medications: Allergies as of 10/29/2019       Reactions   Poractant Alfa Rash, Other (See Comments)   Raises blood pressure   Pork-derived Products Rash, Other (See Comments)   Raises blood pressure        Medication List     TAKE these medications    acetaminophen 325 MG tablet Commonly known as: TYLENOL Take 325-650 mg by mouth every 6 (six) hours as needed for mild pain or headache.   amLODipine 10 MG tablet Commonly known as: NORVASC Take 1 tablet (10 mg total) by mouth daily. What changed: when to take this   amoxicillin-clavulanate 250-125 MG tablet Commonly known as: AUGMENTIN Take 1 tablet by mouth 2 (two) times daily for 7 days.   atorvastatin 40 MG tablet Commonly known as: LIPITOR Take 1 tablet (40 mg total) by mouth daily. What changed: when to take this   Basaglar KwikPen 100 UNIT/ML Inject 0.25 mLs (25 Units total) into the skin 2 (two) times daily. What changed: how much to take   carvedilol 12.5 MG tablet Commonly known as: COREG TAKE 1 TABLET (12.5 MG TOTAL) BY MOUTH 2 (TWO) TIMES DAILY.   Contour Next Test test strip Generic drug: glucose blood Test blood glucose 4 times daily   Fish Oil 1000 MG Caps Take 1,000 mg by mouth in the morning and at bedtime.   fluticasone 50 MCG/ACT nasal spray Commonly known as: Flonase Place 1 spray into both nostrils daily. What changed:   when to take this  reasons to take this   hydrALAZINE 100 MG tablet Commonly known as: APRESOLINE Take 1 tablet (100 mg total) by mouth 3 (three) times daily.   Insulin Pen Needle 32G X 4 MM Misc Use 2 needles a day to inject insulin. IM Program   Lancets Misc Test blood glucose 4  times daily   multivitamin Tabs tablet Take 1 tablet by mouth daily.   oxyCODONE 5 MG immediate release tablet Commonly known as: Roxicodone Take 1 tablet (5 mg total) by mouth every 12 (twelve) hours as needed for up to 5 days for severe pain.   senna-docusate 8.6-50 MG tablet Commonly known as: Senokot S Take 1 tablet by mouth daily. What changed:   when to take this  reasons to take this   Trulicity 1.5 JT/7.0VX Sopn Generic drug: Dulaglutide Inject 1.5 mg into the skin once a week. What changed: when to take this        Disposition and follow-up:   [name] was discharged from [facility] in Stable condition.  At the hospital follow up visit please address:  1.  Acute left-sided mastoiditis  - Patient has follow-up with ENT on 4/15  - Follow up recommendations from ENT  2.  Labs / imaging needed at time of follow-up: na  3.  Pending labs/ test needing follow-up: na Follow-up Appointments: Follow-up Information     Eric Montane, MD Follow up in 3 day(s).   Specialty: Otolaryngology Why: You should follow up in Dr.Byers's clinic on Thursday. Call tomorrow to confirm appoitment.  Contact information: [address] [place] [address] [phone]            Hospital Course by problem list: 1.   #  Acute left sided mastoiditis Patient was seen in outpatient clinic and prescribed Augmentin and oxycodone for cellulitis versus dental infection.  Patient was unable to pick up Augmentin prescription.  His hearing loss and left ear swelling worsened over a few days and he went to ED. CT Scan demonstrated acute left sided otomastoiditis with otitis externa. ENT consulted and patient started on ciprodex drops BID and IV Zosyn. Patients left ear began to improve , he also noted some pain in his right ear. Switched to PO antibiotics, Augmentin. He will have follow up with ENT on Thursday 4/15.  # ESRD Patient went to HD  off schedule on Monday, he will resume outpatient HD on 4/15 per recommendation from nephrology.   # T2DM: Last hemoglobin C on 08/23/2019 was 10.0.  Home medications of glargine 30 units twice daily + dulaglutide 1.5 mg once weekly every Monday. BG at goal.  -Continued inpatient regimen of Levemir 15 units daily + novolog 4 units three times daily with meals + moderate SSI TID with meals + QHS  # HTN: BP elevated on admission to 151/72, likely elevated in setting of acute pain -Continued home amlodipine 10 mg daily + hydralazine 100 mg three times daily + carvedilol 12.5 mg twice daily      Discharge Vitals:   BP 130/78 (BP Location: Left Arm)   Pulse 71   Temp 99.2 F (37.3 C) (Oral)   Resp 18   Ht 5\' 4"  (1.626 m)   Wt 95 kg   SpO2 96%   BMI 35.95 kg/m   Pertinent Labs, Studies, and Procedures:  EXAM: CT TEMPORAL BONES WITH CONTRAST   TECHNIQUE: Axial and coronal plane CT imaging of the petrous temporal bones was performed with thin-collimation image reconstruction after intravenous contrast administration. Multiplanar CT image reconstructions were also generated.   CONTRAST:  62mL OMNIPAQUE IOHEXOL 300 MG/ML  SOLN   COMPARISON:  None available.   FINDINGS: RIGHT TEMPORAL BONE CT:   Auricle and pinna within normal limits. Pre and postauricular soft tissues demonstrate no swelling or significant inflammatory changes. Soft tissue density partially fills the right EAC. No associated osseous erosion. Right tympanic membrane not well seen. Right middle ear cavity is largely clear. Ossicular chain intact and normally formed. Tegmen tympani intact. Right mastoid air cells are largely clear. Sigmoid plate intact. Normal opacification of the adjacent sigmoid sinus and jugular bulb the vestibule, semi circular canals, and cochlea within normal limits. Facial nerve canal intact and bony covered. Carotid canal intact. Negative S tibial ir aqueduct. Normal limits. Inner ear  structures including   LEFT TEMPORAL BONE CT:   Slight asymmetric thickening of the auricle and pinna. Subtle asymmetric swelling with hazy stranding seen within the adjacent pre and postauricular soft tissues. No discrete abscess. Diffuse soft tissue thickening extends into the left EAC which is partially occluded. No associated osseous erosion. Left tympanic membrane not well seen. Soft tissue density partially fills the left middle ear cavity, primarily involving the mesotympanum and hypotympanum. Partial filling of Prussak's space without scutal erosion. Ossicular chain intact and normally formed. Scattered opacification of the left mastoid air cells without erosion or coalescence. Sigmoid plate intact. Normal opacification of the adjacent left sigmoid sinus and jugular bulb. Tegmen tympani intact. Normal IAC. Inner ear structures including the vestibule, semi circular canals, and cochlea within normal limits. Facial nerve canal intact and bony covered. Carotid canal intact. Vestibular aqueduct within normal limits.   Visualized portions of the brain within normal limits. Postsurgical  changes present at the right globe. Globes and orbital soft tissues otherwise within normal limits. Mild mucoperiosteal thickening noted within the ethmoidal air cells and maxillary sinuses. Visualized paranasal sinuses are otherwise clear.   IMPRESSION: 1. Findings suggestive of acute left-sided otomastoiditis with associated otitis externa. No discrete soft tissue abscess. No coalescence or other complicating features. 2. Soft tissue density partially filling and occluding the right EAC, which could reflect cerumen. Correlation with physical exam recommended. Otherwise negative right temporal bone CT with no imaging findings to suggest otomastoiditis or otitis externa.      Discharge Instructions: Discharge Instructions     Call MD for:  difficulty breathing, headache or visual  disturbances   Complete by: As directed    Call MD for:  redness, tenderness, or signs of infection (pain, swelling, redness, odor or green/yellow discharge around incision site)   Complete by: As directed    Call MD for:  temperature >100.4   Complete by: As directed    Diet - low sodium heart healthy   Complete by: As directed    Increase activity slowly   Complete by: As directed        Signed:  Tamsen Snider, MD PGY1

## 2019-10-31 ENCOUNTER — Telehealth (HOSPITAL_COMMUNITY): Payer: Self-pay | Admitting: Nephrology

## 2019-10-31 NOTE — Telephone Encounter (Signed)
Transition of care contact from inpatient facility  Date of discharge: 10/29/19   Date of contact: 10/31/19 Method: Phone Spoke to: Patient's niece  Patient contacted to discuss transition of care from recent inpatient hospitalization. Patient was admitted to [facility] from 4/10-4/06/2020 with discharge diagnosis of acute L mastoiditis.  Medication changes were reviewed. He was able to start antibiotic which was prescribed at discharge (Augmentin)  Patient will follow up with his/her outpatient HD unit on: 4/15 - no issues anticipated.  She reports he is doing well, no assistance needed at this time.  Veneta Penton, PA-C [employer] Pager [phone]

## 2019-11-01 NOTE — [place] Notes (Signed)
Internal Medicine Clinic Attending  I saw and evaluated the patient.  I personally confirmed the key portions of the history and exam documented by Dr. Bloomfield and I reviewed pertinent patient test results.  The assessment, diagnosis, and plan were formulated together and I agree with the documentation in the resident's note.  

## 2019-11-02 ENCOUNTER — Ambulatory Visit: Payer: Self-pay | Admitting: Internal Medicine

## 2019-11-02 VITALS — BP 153/74 | HR 67 | Temp 98.3°F | Wt 211.5 lb

## 2019-11-02 DIAGNOSIS — H6022 Malignant otitis externa, left ear: Secondary | ICD-10-CM

## 2019-11-02 NOTE — Patient Instructions (Addendum)
[name],  It was great seeing. The ear is looking so much better!   Let us know if you need anything after your appointment with ENT today regarding referrals for hearing tests.   Take care! Dr. Ottis Stain, Fue genial verlo. La oreja se ve mucho mejor!  Hganos saber si necesita algo despus de su cita con ENT hoy con respecto a referencias para pruebas de audicin.  Cudate! Dr. Koleen Distance

## 2019-11-06 ENCOUNTER — Encounter: Payer: Self-pay | Admitting: Internal Medicine

## 2019-11-06 DIAGNOSIS — H6022 Malignant otitis externa, left ear: Secondary | ICD-10-CM | POA: Insufficient documentation

## 2019-11-06 NOTE — [place] Notes (Signed)
Acute Office Visit  Subjective:    Patient ID: Eric Glenn, male    DOB: 08-20-1965, 54 y.o.   MRN: [id number]  Chief Complaint  Patient presents with  . Follow-up  . Otalgia    HPI Patient is in today for hospital follow-up for malignant otitis externa.   Past Medical History:  Diagnosis Date  . Anemia   . Diabetes mellitus type 2, controlled ([place]) 08/04/2015  . Diabetic retinopathy ([place])   . Diarrhea   . ESRD (end stage renal disease) ([place]) 03/10/2016  . High cholesterol   . Hypertension   . Nausea 08/2017    Past Surgical History:  Procedure Laterality Date  . AV FISTULA PLACEMENT Right 09/04/2015   Procedure: ARTERIOVENOUS (AV) FISTULA CREATION;  Surgeon: Angelia Mould, MD;  Location: [facility];  Service: Vascular;  Laterality: Right;  . AV FISTULA PLACEMENT Right 01/22/2016   Procedure: RIGHT UPPER ARM BRACHIOCEPHALIC ARTERIOVENOUS (AV) FISTULA CREATION;  Surgeon: Angelia Mould, MD;  Location: [facility];  Service: Vascular;  Laterality: Right;  . IR DIALY SHUNT INTRO [place] W/IMG RIGHT Right 12/08/2018  . LIGATION OF ARTERIOVENOUS  FISTULA Right 01/22/2016   Procedure: LIGATION OF RIGHT FOREARM ARTERIOVENOUS  FISTULA;  Surgeon: Angelia Mould, MD;  Location: [facility];  Service: Vascular;  Laterality: Right;  . PERIPHERAL VASCULAR CATHETERIZATION N/A 01/12/2016   Procedure: Fistulagram;  Surgeon: Angelia Mould, MD;  Location: [facility];  Service: Cardiovascular;  Laterality: N/A;  . UMBILICAL HERNIA REPAIR      Family History  Problem Relation Age of Onset  . Hypertension Mother   . Hyperlipidemia Mother   . Diabetes Mellitus II Sister     Social History   Socioeconomic History  . Marital status: Single    Spouse name: Not on file  . Number of children: Not on file  . Years of education: Not on file  . Highest education level: Not on file  Occupational History  . Not on file  Tobacco Use  . Smoking  status: Former Smoker    Quit date: 10/22/2003    Years since quitting: 16.0  . Smokeless tobacco: Never Used  Substance and Sexual Activity  . Alcohol use: No    Alcohol/week: 0.0 standard drinks  . Drug use: No  . Sexual activity: Not on file  Other Topics Concern  . Not on file  Social History Narrative  . Not on file   Social Determinants of Health   Financial Resource Strain:   . Difficulty of Paying Living Expenses:   Food Insecurity:   . Worried About Charity fundraiser in the Last Year:   . Arboriculturist in the Last Year:   Transportation Needs:   . Film/video editor (Medical):   Marland Kitchen Lack of Transportation (Non-Medical):   Physical Activity:   . Days of Exercise per Week:   . Minutes of Exercise per Session:   Stress:   . Feeling of Stress :   Social Connections:   . Frequency of Communication with Friends and Family:   . Frequency of Social Gatherings with Friends and Family:   . Attends Religious Services:   . Active Member of Clubs or Organizations:   . Attends Archivist Meetings:   Marland Kitchen Marital Status:   Intimate Partner Violence:   . Fear of Current or Ex-Partner:   . Emotionally Abused:   Marland Kitchen Physically Abused:   . Sexually Abused:  Outpatient Medications Prior to Visit  Medication Sig Dispense Refill  . acetaminophen (TYLENOL) 325 MG tablet Take 325-650 mg by mouth every 6 (six) hours as needed for mild pain or headache.    Marland Kitchen amLODipine (NORVASC) 10 MG tablet Take 1 tablet (10 mg total) by mouth daily. (Patient taking differently: Take 10 mg by mouth at bedtime. ) 90 tablet 3  . amoxicillin-clavulanate (AUGMENTIN) 250-125 MG tablet Take 1 tablet by mouth 2 (two) times daily for 7 days. 14 tablet 0  . atorvastatin (LIPITOR) 40 MG tablet Take 1 tablet (40 mg total) by mouth daily. (Patient taking differently: Take 40 mg by mouth at bedtime. ) 90 tablet 3  . carvedilol (COREG) 12.5 MG tablet TAKE 1 TABLET (12.5 MG TOTAL) BY MOUTH 2 (TWO) TIMES  DAILY. 60 tablet 3  . Dulaglutide (TRULICITY) 1.5 NF/6.2ZH SOPN Inject 1.5 mg into the skin once a week. (Patient taking differently: Inject 1.5 mg into the skin every Monday. ) 2 mL 3  . fluticasone (FLONASE) 50 MCG/ACT nasal spray Place 1 spray into both nostrils daily. (Patient taking differently: Place 1 spray into both nostrils daily as needed for allergies. ) 16 g 2  . glucose blood (CONTOUR NEXT TEST) test strip Test blood glucose 4 times daily 100 each 11  . hydrALAZINE (APRESOLINE) 100 MG tablet Take 1 tablet (100 mg total) by mouth 3 (three) times daily. 90 tablet 10  . Insulin Glargine (BASAGLAR KWIKPEN) 100 UNIT/ML SOPN Inject 0.25 mLs (25 Units total) into the skin 2 (two) times daily. (Patient taking differently: Inject 30 Units into the skin 2 (two) times daily. ) 20 pen 6  . Insulin Pen Needle 32G X 4 MM MISC Use 2 needles a day to inject insulin. IM Program 100 each 12  . Lancets MISC Test blood glucose 4 times daily 100 each 11  . multivitamin (RENA-VIT) TABS tablet Take 1 tablet by mouth daily. 60 tablet 5  . Omega-3 Fatty Acids (FISH OIL) 1000 MG CAPS Take 1,000 mg by mouth in the morning and at bedtime.    . senna-docusate (SENOKOT S) 8.6-50 MG tablet Take 1 tablet by mouth daily. (Patient taking differently: Take 1 tablet by mouth daily as needed for mild constipation. ) 30 tablet 0   No facility-administered medications prior to visit.    Allergies  Allergen Reactions  . Poractant Alfa Rash and Other (See Comments)    Raises blood pressure  . Pork-Derived Products Rash and Other (See Comments)    Raises blood pressure    Review of Systems  Constitutional: Negative for appetite change, chills and fever.  HENT: Negative for congestion, dental problem, ear discharge, ear pain and tinnitus.   Gastrointestinal: Negative for diarrhea and nausea.       Objective:    Physical Exam Constitutional:      General: He is not in acute distress.    Appearance: Normal  appearance.  HENT:     Head:     Jaw: No tenderness, swelling or pain on movement.     Right Ear: Decreased hearing noted.     Left Ear: Hearing, tympanic membrane, ear canal and external ear normal. No tenderness. No mastoid tenderness.     Mouth/Throat:     Pharynx: Oropharynx is clear. No oropharyngeal exudate or posterior oropharyngeal erythema.  Neurological:     Mental Status: He is alert.     BP (!) 153/74 (BP Location: Right Arm, Patient Position: Sitting, Cuff Size: Small)  Pulse 67   Temp 98.3 F (36.8 C) (Oral)   Wt 211 lb 8 oz (95.9 kg)   SpO2 97%   BMI 36.30 kg/m  Wt Readings from Last 3 Encounters:  11/02/19 211 lb 8 oz (95.9 kg)  10/29/19 209 lb 7 oz (95 kg)  10/26/19 210 lb 12.8 oz (95.6 kg)    Health Maintenance Due  Topic Date Due  . OPHTHALMOLOGY EXAM  Never done  . COVID-19 Vaccine (1) Never done  . TETANUS/TDAP  Never done  . COLONOSCOPY  Never done    There are no preventive care reminders to display for this patient.   Lab Results  Component Value Date   TSH 2.270 08/28/2015   Lab Results  Component Value Date   WBC 8.9 10/29/2019   HGB 10.6 (L) 10/29/2019   HCT 31.7 (L) 10/29/2019   MCV 91.4 10/29/2019   PLT 161 10/29/2019   Lab Results  Component Value Date   NA 137 10/29/2019   K 4.2 10/29/2019   CO2 24 10/29/2019   GLUCOSE 120 (H) 10/29/2019   BUN 40 (H) 10/29/2019   CREATININE 8.98 (H) 10/29/2019   BILITOT 0.4 04/18/2018   ALKPHOS 85 04/18/2018   AST 20 04/18/2018   ALT 30 04/18/2018   PROT 7.7 04/18/2018   ALBUMIN 3.5 10/29/2019   CALCIUM 8.6 (L) 10/29/2019   ANIONGAP 18 (H) 10/29/2019   Lab Results  Component Value Date   CHOL 93 (L) 02/05/2016   Lab Results  Component Value Date   HDL 28 (L) 02/05/2016   Lab Results  Component Value Date   LDLCALC 32 02/05/2016   Lab Results  Component Value Date   TRIG 166 (H) 02/05/2016   Lab Results  Component Value Date   CHOLHDL 3.3 02/05/2016   Lab Results    Component Value Date   HGBA1C 10.0 (A) 08/27/2019       Assessment & Plan:   Problem List Items Addressed This Visit    None       No orders of the defined types were placed in this encounter.    Delice Bison, DO

## 2019-11-06 NOTE — Assessment & Plan Note (Signed)
Patient admitted last weekend for malignant otitis externa after being seen in the clinic the day prior. He was unable to fill his antibiotic and developed worsening swelling and started to have hearing loss on the left. CT of the temporal bones showed acute otitis externa involving the surrounding soft tissue with reactive changes in mastoid as well. He was started on Zosyn and was able to be transitioned to PO Augmentin and discharged the following day. Today, patient is doing much better. Swelling has almost completed resolved. His hearing on the left has returned to baseline. He is able to chew without difficulty.  He has completed course of Augmentin.  He has follow-up with ENT later this afternoon.

## 2019-11-07 NOTE — [place] Notes (Signed)
Internal Medicine Clinic Attending  Case discussed with Dr. Bloomfield at the time of the visit.  We reviewed the resident's history and exam and pertinent patient test results.  I agree with the assessment, diagnosis, and plan of care documented in the resident's note.  

## 2019-11-13 ENCOUNTER — Encounter: Payer: Self-pay | Admitting: *Deleted

## 2019-11-23 MED FILL — AMLODIPINE BESYLATE 10 MG T: 10 | 30 days supply | Qty: 30 | Fill #4

## 2019-11-23 MED FILL — ATORVASTATIN 40 MG TABLET: 40 | 30 days supply | Qty: 30 | Fill #5

## 2019-11-23 MED FILL — CARVEDILOL 12.5 MG TABLET: 12.5 | 30 days supply | Qty: 60 | Fill #3

## 2019-11-26 ENCOUNTER — Other Ambulatory Visit: Payer: Self-pay

## 2019-11-26 ENCOUNTER — Ambulatory Visit: Payer: Self-pay | Admitting: Internal Medicine

## 2019-11-26 VITALS — BP 175/71 | HR 99 | Temp 98.1°F

## 2019-11-26 DIAGNOSIS — L97511 Non-pressure chronic ulcer of other part of right foot limited to breakdown of skin: Secondary | ICD-10-CM

## 2019-11-26 DIAGNOSIS — Z794 Long term (current) use of insulin: Secondary | ICD-10-CM

## 2019-11-26 DIAGNOSIS — IMO0002 Reserved for concepts with insufficient information to code with codable children: Secondary | ICD-10-CM

## 2019-11-26 DIAGNOSIS — I10 Essential (primary) hypertension: Secondary | ICD-10-CM

## 2019-11-26 DIAGNOSIS — E1165 Type 2 diabetes mellitus with hyperglycemia: Secondary | ICD-10-CM

## 2019-11-26 DIAGNOSIS — E11621 Type 2 diabetes mellitus with foot ulcer: Secondary | ICD-10-CM

## 2019-11-26 DIAGNOSIS — Z79899 Other long term (current) drug therapy: Secondary | ICD-10-CM

## 2019-11-26 LAB — POCT GLYCOSYLATED HEMOGLOBIN (HGB A1C): Hemoglobin A1C: 7.9 % — AB (ref 4.0–5.6)

## 2019-11-26 LAB — GLUCOSE, CAPILLARY: Glucose-Capillary: 292 mg/dL — ABNORMAL HIGH (ref 70–99)

## 2019-11-26 MED ORDER — BASAGLAR KWIKPEN 100 UNIT/ML ~~LOC~~ SOPN: 35.0000 [IU] | PEN_INJECTOR | Freq: Two times a day (BID) | SUBCUTANEOUS | 6 refills | Status: DC

## 2019-11-26 NOTE — Patient Instructions (Signed)
Le subi la dosis de insulina a 35 [employer] al dia. Si tiene sintomas de Biomedical scientist, baje su dosis a 30 unidades dos veces al dia.   Para la ulcera en el pie, mantengala seca todo el tiempo. La humedad lo puede poner a riesgo de infeccion.   Haga una cita conmigo la proxima semana.   - Dr. Frederico Hamman

## 2019-11-28 ENCOUNTER — Other Ambulatory Visit: Payer: Self-pay | Admitting: Internal Medicine

## 2019-11-28 ENCOUNTER — Encounter: Payer: Self-pay | Admitting: Internal Medicine

## 2019-11-28 DIAGNOSIS — L97511 Non-pressure chronic ulcer of other part of right foot limited to breakdown of skin: Secondary | ICD-10-CM | POA: Insufficient documentation

## 2019-11-28 HISTORY — DX: Non-pressure chronic ulcer of other part of right foot limited to breakdown of skin: L97.511

## 2019-11-28 MED ORDER — INSULIN GLARGINE 100 UNIT/ML SOLOSTAR PEN: 35.0000 [IU] | PEN_INJECTOR | Freq: Two times a day (BID) | SUBCUTANEOUS | 6 refills | Status: DC

## 2019-11-28 NOTE — [place] Notes (Signed)
Internal Medicine Clinic Attending  Case discussed with Dr. Santos-Sanchez at the time of the visit.  We reviewed the resident's history and exam and pertinent patient test results.  I agree with the assessment, diagnosis, and plan of care documented in the resident's note.  Ernesta Trabert, M.D., Ph.D.  

## 2019-11-28 NOTE — Assessment & Plan Note (Signed)
Small superficial ulcer noted on R foot between 2nd and 3rd toes during foot exam today. No signs of infection present at this time. However, I am concerned this could progress and develop an infection given poor glycemic control. I adjusted his insulin regimen today and asked him to maintain the area dry. He will follow up with me in 1 week.

## 2019-11-28 NOTE — Assessment & Plan Note (Signed)
BP uncontrolled during this visit.  He appears volume overloaded on exam today.  Less HD session was 3 days ago. He is due for his next one tomorrow. Will not adjust antihypertensive regimen today as I suspect this is a volume issue.  His niece tells me that someone st his HD center asked patient to stop hydralazine but she is not sure why. Therefore, he has continued to take it. I do not see an indication to stop hydralazine at this time and recommended patient to continue it.  - Continue current regimen of amlodipine, hydralazine, and Coreg

## 2019-11-28 NOTE — [place] Notes (Signed)
   CC: Diabetes and HTN follow up   HPI:  [name] is a 54 y.o. year-old male with PMH listed below who presents to clinic for DM and HTN follow up. Please see problem based assessment and plan for further details.   Past Medical History:  Diagnosis Date  . Anemia   . Diabetes mellitus type 2, controlled ([place]) 08/04/2015  . Diabetic retinopathy ([place])   . Diarrhea   . ESRD (end stage renal disease) ([place]) 03/10/2016  . High cholesterol   . Hypertension   . Nausea 08/2017   Review of Systems:   Review of Systems  Constitutional: Negative for chills, fever and weight loss.  HENT: Positive for hearing loss. Negative for ear discharge, ear pain and tinnitus.   Respiratory: Negative for shortness of breath.   Cardiovascular: Positive for leg swelling. Negative for chest pain and palpitations.  Neurological: Positive for headaches.    Physical Exam:  Vitals:   11/26/19 1602  BP: (!) 175/71  Pulse: 99  Temp: 98.1 F (36.7 C)  TempSrc: Oral  SpO2: 99%    General: chronically ill appearing male in NAD  Cardiac: regular rate and rhythm, nl S1/S2, no murmurs, rubs or gallops  Pulm: CTAB, no wheezes or crackles, no increased work of breathing on room air  Ext: 2+ pitting edema bilaterally, warm and well perfused  Derm: there is superficial ulcer between 2nd and 3rd toe on R foot. No warmth, erythema or discharge noted.    Assessment & Plan:   See Encounters Tab for problem based charting.  Patient discussed with Dr. Rebeca Alert

## 2019-11-28 NOTE — Assessment & Plan Note (Signed)
Patient presents for diabetes follow-up.  He was last seen 3 months ago at which time his long-acting insulin was increased to 30 units twice daily.  He reports his blood glucose remains elevated despite this change.  However, he does not check CBGs at home. He is getting CBG checks during HD and states they are usually 250-300s. No symptoms of hypoglycemia. A1c 7.9 today but I suspect this is falsely low in the setting of ACOD from ESRD. We discussed checking BG at home at least 2x/per day as well as adherence to a low carb diet.  - Patient no longer enrolled in [place] patient assistance program so we will switch long-acting insulin from Emery to Lantus and increase dose to 35 units BID - Patient's niece educated on symptoms of hypoglycemia and advised to reduce dose to 30 units BID these are present - Continue Trulicity  - Foot exam performed today, superficial ulcer found  - Ophthalmology referral has been placed for eye exam, awaiting completion of paperwork for orange card

## 2019-12-03 ENCOUNTER — Encounter: Payer: Self-pay | Admitting: Internal Medicine

## 2019-12-03 ENCOUNTER — Other Ambulatory Visit: Payer: Self-pay

## 2019-12-03 ENCOUNTER — Ambulatory Visit (INDEPENDENT_AMBULATORY_CARE_PROVIDER_SITE_OTHER): Payer: Self-pay | Admitting: Internal Medicine

## 2019-12-03 VITALS — BP 156/76 | HR 65 | Temp 98.0°F | Wt 214.5 lb

## 2019-12-03 DIAGNOSIS — L309 Dermatitis, unspecified: Secondary | ICD-10-CM | POA: Insufficient documentation

## 2019-12-03 DIAGNOSIS — L97511 Non-pressure chronic ulcer of other part of right foot limited to breakdown of skin: Secondary | ICD-10-CM

## 2019-12-03 MED ORDER — TRIAMCINOLONE ACETONIDE 0.1 % EX CREA: 1.0000 "application " | TOPICAL_CREAM | Freq: Two times a day (BID) | CUTANEOUS | 2 refills | Status: DC

## 2019-12-03 MED FILL — TRIAMCINOLONE 0.1% CREAM: 0.1 | 30 days supply | Qty: 15 | Fill #0

## 2019-12-03 NOTE — Patient Instructions (Addendum)
  Senor Eric Glenn,   La ulcera en su pie se ve mucho mejor. Continue manteniendo el area [place]. Tiene una ulcera nueva en el mismo pie. Coloque un algodon en el area para [employer]. Asegurese de [employer] humectados para evitar nuevas [place].   Le envie una receta de la crema de esteroides a la farmacia de [place].   Haga una cita de seguimiento en 3 meses para checarle su diabetes.   - Dra. Frederico Hamman

## 2019-12-03 NOTE — Assessment & Plan Note (Signed)
Patient presents for follow up of R foot ulcer that was found on exam 1 week ago. He has been keeping the area dry and moisturizing his feet. Ulcer is healed on exam but there is a new on the same foot between 4-5th toes. Very shallow, early stages limited to skin breakdown. No signs of infection. Will plan on keeping area dry. Unable to come next week for follow up. Will call to follow up.

## 2019-12-03 NOTE — [place] Notes (Signed)
   CC: R foot ulcer follow up   HPI:  [name] is a 54 y.o. year-old male with PMH listed below who presents to clinic for R foot ulcer follow up. Please see problem based assessment and plan for further details.   Past Medical History:  Diagnosis Date  . Anemia   . Diabetes mellitus type 2, controlled ([place]) 08/04/2015  . Diabetic retinopathy ([place])   . Diarrhea   . ESRD (end stage renal disease) ([place]) 03/10/2016  . High cholesterol   . Hypertension   . Nausea 08/2017   Review of Systems:   Review of Systems  Constitutional: Negative for chills, fever and malaise/fatigue.  Skin: Negative for itching and rash.    Physical Exam:  Vitals:   12/03/19 1114  BP: (!) 156/76  Pulse: 65  Temp: 98 F (36.7 C)  TempSrc: Oral  SpO2: 99%  Weight: 214 lb 8 oz (97.3 kg)    General: well appearing male in NAD  Derm: Ulcer between R 2nd and 3rd toe has healed. There is a new shallow ulcer between 4-5th toes on same foot. No signs of infection noted.    Assessment & Plan:   See Encounters Tab for problem based charting.  Patient discussed with Dr. Rebeca Alert

## 2019-12-03 NOTE — Assessment & Plan Note (Signed)
Patient has a long history of transient dermatitis on bilateral LE extremities that has responded well to steroid cream in the past. The etiology is unclear. Previously referred to dermatology but is uninsured and was unable to be seen. Today he presents with red, flat spots in his LLE. They are not itchy or tender to palpation. Refilled triamcinolone 0.1% BID. Will reassess at following visit.

## 2019-12-04 NOTE — [place] Notes (Signed)
Internal Medicine Clinic Attending  Case discussed with Dr. Santos-Sanchez at the time of the visit.  We reviewed the resident's history and exam and pertinent patient test results.  I agree with the assessment, diagnosis, and plan of care documented in the resident's note.  Kamaljit Hizer, M.D., Ph.D.  

## 2019-12-21 MED FILL — CARVEDILOL 12.5 MG TABLET: 12.5 | 30 days supply | Qty: 60 | Fill #0

## 2019-12-21 MED FILL — AMLODIPINE BESYLATE 10 MG T: 10 | 30 days supply | Qty: 30 | Fill #5

## 2019-12-21 MED FILL — hydrALAZINE HCL 100 MG TABS: 100 | 30 days supply | Qty: 90 | Fill #5

## 2019-12-21 MED FILL — ATORVASTATIN 40 MG TABLET: 40 | 30 days supply | Qty: 30 | Fill #6

## 2019-12-21 MED FILL — LANTUS SOLOSTAR 100 UNITS/M: 100 | 21 days supply | Qty: 15 | Fill #1

## 2020-01-23 MED FILL — CARVEDILOL 12.5 MG TABLET: 12.5 | 30 days supply | Qty: 60 | Fill #1

## 2020-01-23 MED FILL — AMLODIPINE BESYLATE 10 MG T: 10 | 30 days supply | Qty: 30 | Fill #6

## 2020-01-23 MED FILL — ATORVASTATIN 40 MG TABLET: 40 | 30 days supply | Qty: 30 | Fill #7

## 2020-01-23 MED FILL — hydrALAZINE HCL 100 MG TABS: 100 | 30 days supply | Qty: 90 | Fill #6

## 2020-01-27 ENCOUNTER — Other Ambulatory Visit: Payer: Self-pay

## 2020-01-27 ENCOUNTER — Ambulatory Visit (HOSPITAL_COMMUNITY)
Admission: EM | Admit: 2020-01-27 | Discharge: 2020-01-27 | Disposition: A | Discharge status: Home / Self Care | Payer: HRSA Program | Attending: Emergency Medicine | Admitting: Emergency Medicine

## 2020-01-27 ENCOUNTER — Ambulatory Visit (INDEPENDENT_AMBULATORY_CARE_PROVIDER_SITE_OTHER): Payer: HRSA Program

## 2020-01-27 DIAGNOSIS — E1122 Type 2 diabetes mellitus with diabetic chronic kidney disease: Secondary | ICD-10-CM | POA: Diagnosis not present

## 2020-01-27 DIAGNOSIS — I120 Hypertensive chronic kidney disease with stage 5 chronic kidney disease or end stage renal disease: Secondary | ICD-10-CM | POA: Diagnosis not present

## 2020-01-27 DIAGNOSIS — Z8249 Family history of ischemic heart disease and other diseases of the circulatory system: Secondary | ICD-10-CM | POA: Insufficient documentation

## 2020-01-27 DIAGNOSIS — Z87891 Personal history of nicotine dependence: Secondary | ICD-10-CM | POA: Diagnosis not present

## 2020-01-27 DIAGNOSIS — R059 Cough, unspecified: Secondary | ICD-10-CM

## 2020-01-27 DIAGNOSIS — Z880 Allergy status to penicillin: Secondary | ICD-10-CM | POA: Insufficient documentation

## 2020-01-27 DIAGNOSIS — Z794 Long term (current) use of insulin: Secondary | ICD-10-CM | POA: Diagnosis not present

## 2020-01-27 DIAGNOSIS — E785 Hyperlipidemia, unspecified: Secondary | ICD-10-CM | POA: Insufficient documentation

## 2020-01-27 DIAGNOSIS — R05 Cough: Secondary | ICD-10-CM

## 2020-01-27 DIAGNOSIS — Z992 Dependence on renal dialysis: Secondary | ICD-10-CM | POA: Diagnosis not present

## 2020-01-27 DIAGNOSIS — E11319 Type 2 diabetes mellitus with unspecified diabetic retinopathy without macular edema: Secondary | ICD-10-CM | POA: Insufficient documentation

## 2020-01-27 DIAGNOSIS — R0602 Shortness of breath: Secondary | ICD-10-CM | POA: Diagnosis present

## 2020-01-27 DIAGNOSIS — N186 End stage renal disease: Secondary | ICD-10-CM | POA: Insufficient documentation

## 2020-01-27 DIAGNOSIS — Z20822 Contact with and (suspected) exposure to covid-19: Secondary | ICD-10-CM | POA: Diagnosis not present

## 2020-01-27 DIAGNOSIS — L97511 Non-pressure chronic ulcer of other part of right foot limited to breakdown of skin: Secondary | ICD-10-CM | POA: Diagnosis not present

## 2020-01-27 DIAGNOSIS — Z833 Family history of diabetes mellitus: Secondary | ICD-10-CM | POA: Diagnosis not present

## 2020-01-27 DIAGNOSIS — Z79899 Other long term (current) drug therapy: Secondary | ICD-10-CM | POA: Insufficient documentation

## 2020-01-27 DIAGNOSIS — E11621 Type 2 diabetes mellitus with foot ulcer: Secondary | ICD-10-CM | POA: Insufficient documentation

## 2020-01-27 MED ORDER — BENZONATATE 200 MG PO CAPS: 200.0000 mg | ORAL_CAPSULE | Freq: Three times a day (TID) | ORAL | 0 refills | Status: AC | PRN

## 2020-01-27 MED ORDER — DOXYCYCLINE HYCLATE 100 MG PO CAPS: 100.0000 mg | ORAL_CAPSULE | Freq: Two times a day (BID) | ORAL | 0 refills | Status: AC

## 2020-01-27 MED ORDER — DM-GUAIFENESIN ER 30-600 MG PO TB12: 1.0000 | ORAL_TABLET | Freq: Two times a day (BID) | ORAL | 0 refills | Status: DC

## 2020-01-27 MED ORDER — FLUTICASONE PROPIONATE 50 MCG/ACT NA SUSP: 1.0000 | Freq: Every day | NASAL | 0 refills | Status: DC

## 2020-01-27 NOTE — ED Provider Notes (Signed)
[facility]    CSN: [id number] Arrival date & time: 01/27/20  1047      History   Chief Complaint Chief Complaint  Patient presents with  . Shortness of Breath  . Nasal Congestion    HPI Eric Glenn is a 54 y.o. male history of DM type II, hypertension, ESRD, presenting today for evaluation of shortness of breath.  Patient reports that he has been feeling short of breath for a while, and over the past week has had increased shortness of breath.  Associated congestion cough over the past week as well.  Denies sore throat.  Denies fevers chills nausea vomiting diarrhea or abdominal pain.  Reports needing oxygen to help with shortness of breath when at dialysis, it is unclear if this is due to dropping O2 versus for comfort.  Denies history of asthma/COPD.  Does report remote past history of tobacco use.  Denies tobacco use at current.  Has plans to follow-up with ENT for shortness of breath.  HPI  Past Medical History:  Diagnosis Date  . Anemia   . Diabetes mellitus type 2, controlled ([place]) 08/04/2015  . Diabetic retinopathy ([place])   . Diarrhea   . ESRD (end stage renal disease) ([place]) 03/10/2016  . High cholesterol   . Hypertension   . Nausea 08/2017    Patient Active Problem List   Diagnosis Date Noted  . Dermatitis of lower extremity 12/03/2019  . Ulcer of right foot, limited to breakdown of skin ([place]) 11/28/2019  . 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 Bilateral leg cramps 08/27/2019  . Chronic idiopathic constipation 05/29/2019  . ESRD (end stage renal disease) ([place]) 03/10/2016  . Nasal septal deviation 12/10/2015  . Asthma 10/01/2015  . Hypersomnia 10/01/2015  . Obesity 10/01/2015  . Normocytic anemia 08/28/2015  . Hyperlipidemia 08/19/2015  . Sinusitis, chronic 08/19/2015  . Essential hypertension 08/04/2015  . Uncontrolled type 2 diabetes mellitus with complication ([place]) 57/32/2025  . Diabetic  retinopathy associated with type 2 diabetes mellitus ([place]) 08/04/2015    Past Surgical History:  Procedure Laterality Date  . AV FISTULA PLACEMENT Right 09/04/2015   Procedure: ARTERIOVENOUS (AV) FISTULA CREATION;  Surgeon: Angelia Mould, MD;  Location: [facility];  Service: Vascular;  Laterality: Right;  . AV FISTULA PLACEMENT Right 01/22/2016   Procedure: RIGHT UPPER ARM BRACHIOCEPHALIC ARTERIOVENOUS (AV) FISTULA CREATION;  Surgeon: Angelia Mould, MD;  Location: [facility];  Service: Vascular;  Laterality: Right;  . IR DIALY SHUNT INTRO [place] W/IMG RIGHT Right 12/08/2018  . LIGATION OF ARTERIOVENOUS  FISTULA Right 01/22/2016   Procedure: LIGATION OF RIGHT FOREARM ARTERIOVENOUS  FISTULA;  Surgeon: Angelia Mould, MD;  Location: [facility];  Service: Vascular;  Laterality: Right;  . PERIPHERAL VASCULAR CATHETERIZATION N/A 01/12/2016   Procedure: Fistulagram;  Surgeon: Angelia Mould, MD;  Location: [facility];  Service: Cardiovascular;  Laterality: N/A;  . UMBILICAL HERNIA REPAIR         Home Medications    Prior to Admission medications   Medication Sig Start Date End Date Taking? Authorizing Provider  amLODipine (NORVASC) 10 MG tablet Take 1 tablet (10 mg total) by mouth daily. Patient taking differently: Take 10 mg by mouth at bedtime.  05/28/19  Yes [name], MD  atorvastatin (LIPITOR) 40 MG tablet Take 1 tablet (40 mg total) by mouth daily. Patient taking differently: Take 40 mg by mouth at bedtime.  05/28/19  Yes  [name], MD  carvedilol (COREG) 12.5 MG tablet TAKE 1 TABLET (12.5 MG TOTAL) BY MOUTH 2 (TWO) TIMES DAILY. 10/22/19  Yes Santos-Sanchez, Merlene Morse, MD  Dulaglutide (TRULICITY) 1.5 TD/1.7OH SOPN Inject 1.5 mg into the skin once a week. Patient taking differently: Inject 1.5 mg into the skin every Monday.  05/28/19  Yes [name], MD  hydrALAZINE (APRESOLINE) 100 MG tablet Take 1 tablet (100 mg total) by mouth 3 (three) times  daily. 05/28/19  Yes [name], MD  insulin glargine (LANTUS) 100 UNIT/ML Solostar Pen Inject 35 Units into the skin 2 (two) times daily. 11/28/19  Yes Santos-Sanchez, Merlene Morse, MD  multivitamin (RENA-VIT) TABS tablet Take 1 tablet by mouth daily. 08/04/18  Yes Santos-Sanchez, Merlene Morse, MD  Omega-3 Fatty Acids (FISH OIL) 1000 MG CAPS Take 1,000 mg by mouth in the morning and at bedtime.   Yes [provider]  acetaminophen (TYLENOL) 325 MG tablet Take 325-650 mg by mouth every 6 (six) hours as needed for mild pain or headache.    [provider]  benzonatate (TESSALON) 200 MG capsule Take 1 capsule (200 mg total) by mouth 3 (three) times daily as needed for up to 7 days for cough. 01/27/20 02/03/20  Zilda No C, PA-C  dextromethorphan-guaiFENesin (MUCINEX DM) 30-600 MG 12hr tablet Take 1 tablet by mouth 2 (two) times daily. 01/27/20   Anjolaoluwa Siguenza C, PA-C  doxycycline (VIBRAMYCIN) 100 MG capsule Take 1 capsule (100 mg total) by mouth 2 (two) times daily for 10 days. 01/27/20 02/06/20  Janeth Terry C, PA-C  fluticasone (FLONASE) 50 MCG/ACT nasal spray Place 1-2 sprays into both nostrils daily for 7 days. 01/27/20 02/03/20  Trenice Mesa C, PA-C  glucose blood (CONTOUR NEXT TEST) test strip Test blood glucose 4 times daily 01/01/19   Welford Roche, MD  Insulin Pen Needle 32G X 4 MM MISC Use 2 needles a day to inject insulin. IM Program 01/22/19   Welford Roche, MD  Lancets MISC Test blood glucose 4 times daily 01/01/19   Welford Roche, MD  senna-docusate (SENOKOT S) 8.6-50 MG tablet Take 1 tablet by mouth daily. Patient taking differently: Take 1 tablet by mouth daily as needed for mild constipation.  05/28/19   [name], MD  triamcinolone cream (KENALOG) 0.1 % Apply 1 application topically 2 (two) times daily. 12/03/19   Santos-Sanchez, Merlene Morse, MD  amLODipine (NORVASC) 10 MG tablet Take 1 tablet (10 mg total) by mouth daily. 03/01/18   Santos-Sanchez,  Merlene Morse, MD  atorvastatin (LIPITOR) 40 MG tablet Take 1 tablet (40 mg total) by mouth daily. 03/01/18   Welford Roche, MD    Family History Family History  Problem Relation Age of Onset  . Hypertension Mother   . Hyperlipidemia Mother   . Diabetes Mellitus II Sister     Social History Social History   Tobacco Use  . Smoking status: Former Smoker    Quit date: 10/22/2003    Years since quitting: 16.2  . Smokeless tobacco: Never Used  Vaping Use  . Vaping Use: Never used  Substance Use Topics  . Alcohol use: No    Alcohol/week: 0.0 standard drinks  . Drug use: No     Allergies   Other, Poractant alfa, Pork-derived products, and Penicillins   Review of Systems Review of Systems  Constitutional: Negative for activity change, appetite change, chills, fatigue and fever.  HENT: Positive for congestion, rhinorrhea and sinus pressure. Negative for ear pain, sore throat and trouble swallowing.  Eyes: Negative for discharge and redness.  Respiratory: Positive for cough and shortness of breath. Negative for chest tightness.   Cardiovascular: Negative for chest pain.  Gastrointestinal: Negative for abdominal pain, diarrhea, nausea and vomiting.  Musculoskeletal: Negative for myalgias.  Skin: Negative for rash.  Neurological: Negative for dizziness, light-headedness and headaches.     Physical Exam Triage Vital Signs ED Triage Vitals  Enc Vitals Group     BP 01/27/20 1120 124/60     Pulse Rate 01/27/20 1120 63     Resp 01/27/20 1120 20     Temp 01/27/20 1120 98.2 F (36.8 C)     Temp Source 01/27/20 1120 Oral     SpO2 01/27/20 1120 100 %     Weight --      Height --      Head Circumference --      Peak Flow --      Pain Score 01/27/20 1123 0     Pain Loc --      Pain Edu? --      Excl. in [place]? --    No data found.  Updated Vital Signs BP 124/60 (BP Location: Left Arm)   Pulse 63   Temp 98.2 F (36.8 C) (Oral)   Resp 20   SpO2 100%   Visual  Acuity Right Eye Distance:   Left Eye Distance:   Bilateral Distance:    Right Eye Near:   Left Eye Near:    Bilateral Near:     Physical Exam Vitals and nursing note reviewed.  Constitutional:      Appearance: He is well-developed.     Comments: No acute distress  HENT:     Head: Normocephalic and atraumatic.     Nose: Nose normal.     Comments: Rhinorrhea present in bilateral nares    Mouth/Throat:     Comments: Oral mucosa pink and moist, no tonsillar enlargement or exudate. Posterior pharynx patent and nonerythematous, no uvula deviation or swelling. Normal phonation. Eyes:     Conjunctiva/sclera: Conjunctivae normal.  Cardiovascular:     Rate and Rhythm: Normal rate.  Pulmonary:     Effort: Pulmonary effort is normal. No respiratory distress.     Comments: Breathing comfortably at rest, CTABL, no wheezing, rales or other adventitious sounds auscultated  Occasional cough in room which takes breath away Abdominal:     General: There is no distension.  Musculoskeletal:        General: Normal range of motion.     Cervical back: Neck supple.  Skin:    General: Skin is warm and dry.  Neurological:     Mental Status: He is alert and oriented to person, place, and time.      UC Treatments / Results  Labs (all labs ordered are listed, but only abnormal results are displayed) Labs Reviewed  SARS CORONAVIRUS 2 (TAT 6-24 HRS)    EKG   Radiology DG Chest 2 View  Result Date: 01/27/2020 CLINICAL DATA:  Cough and shortness of breath EXAM: CHEST - 2 VIEW COMPARISON:  December 21, 2017 FINDINGS: The lungs are clear. Heart is upper normal in size with pulmonary vascularity normal. No adenopathy. No bone lesions. IMPRESSION: Lungs clear.  Heart upper normal in size.  No evident adenopathy. Electronically Signed   By: Lowella Grip III M.D.   On: 01/27/2020 12:14    Procedures Procedures (including critical care time)  Medications Ordered in UC Medications - No data to  display  Initial  Impression / Assessment and Plan / UC Course  I have reviewed the triage vital signs and the nursing notes.  Pertinent labs & imaging results that were available during my care of the patient were reviewed by me and considered in my medical decision making (see chart for details).     X-ray negative, given length of symptoms and significant comorbidities good hand covering with doxycycline, Tessalon for cough, Mucinex DM and Flonase nasal spray.  Continue to monitor breathing and shortness of breath, vital signs stable today.Discussed strict return precautions. Patient verbalized understanding and is agreeable with plan.  Final Clinical Impressions(s) / UC Diagnoses   Final diagnoses:  Cough  Shortness of breath     Discharge Instructions     Begin doxycycline twice daily for 1 week Tessalon/benzonatate every 8 hours as needed for cough Mucinex DM twice daily for congestion and cough    ED Prescriptions    Medication Sig Dispense Auth. Provider   doxycycline (VIBRAMYCIN) 100 MG capsule Take 1 capsule (100 mg total) by mouth 2 (two) times daily for 10 days. 20 capsule Maxon Kresse C, PA-C   benzonatate (TESSALON) 200 MG capsule Take 1 capsule (200 mg total) by mouth 3 (three) times daily as needed for up to 7 days for cough. 28 capsule Welby Montminy C, PA-C   dextromethorphan-guaiFENesin (MUCINEX DM) 30-600 MG 12hr tablet Take 1 tablet by mouth 2 (two) times daily. 20 tablet Oaklan Persons C, PA-C   fluticasone (FLONASE) 50 MCG/ACT nasal spray Place 1-2 sprays into both nostrils daily for 7 days. 1 g Jaxson Anglin, [place] C, PA-C     PDMP not reviewed this encounter.   Janith Lima, PA-C 01/27/20 1247

## 2020-01-27 NOTE — Discharge Instructions (Signed)
Begin doxycycline twice daily for 1 week Tessalon/benzonatate every 8 hours as needed for cough Mucinex DM twice daily for congestion and cough

## 2020-01-27 NOTE — ED Triage Notes (Signed)
Pt c/o ongoing SOB that worsened last night while at rest. Reports has recently required O2 while having dialysis. Last dialysis was yesterday w/o incident.  Pt also c/o dry, non-productive cough, sore throat, congestion.  Denies fever, chills, abdominal pain, n/v/d, CP.

## 2020-01-28 LAB — SARS CORONAVIRUS 2 (TAT 6-24 HRS): SARS Coronavirus 2: NEGATIVE

## 2020-02-26 MED FILL — AMLODIPINE BESYLATE 10 MG T: 10 | 30 days supply | Qty: 30 | Fill #7

## 2020-02-26 MED FILL — CARVEDILOL 12.5 MG TABLET: 12.5 | 30 days supply | Qty: 60 | Fill #2

## 2020-02-26 MED FILL — hydrALAZINE HCL 100 MG TABS: 100 | 30 days supply | Qty: 90 | Fill #7

## 2020-02-26 MED FILL — LANTUS SOLOSTAR 100 UNITS/M: 100 | 30 days supply | Qty: 21 | Fill #2

## 2020-02-26 MED FILL — ATORVASTATIN 40 MG TABLET: 40 | 30 days supply | Qty: 30 | Fill #8

## 2020-03-26 ENCOUNTER — Other Ambulatory Visit: Payer: Self-pay | Admitting: Internal Medicine

## 2020-03-26 DIAGNOSIS — IMO0002 Reserved for concepts with insufficient information to code with codable children: Secondary | ICD-10-CM

## 2020-03-26 MED ORDER — UNIFINE PENTIPS 32G X 4 MM MISC: 12 refills | Status: DC

## 2020-03-26 MED FILL — AMLODIPINE BESYLATE 10 MG T: 10 | 30 days supply | Qty: 30 | Fill #8

## 2020-03-26 MED FILL — UNIFINE PENTIPS 32GX5/32: 32G X 4 MM | 30 days supply | Qty: 100 | Fill #0

## 2020-03-26 MED FILL — CARVEDILOL 12.5 MG TABLET: 12.5 | 30 days supply | Qty: 60 | Fill #3

## 2020-03-26 MED FILL — hydrALAZINE HCL 100 MG TABS: 100 | 30 days supply | Qty: 90 | Fill #8

## 2020-03-26 MED FILL — LANTUS SOLOSTAR 100 UNITS/M: 100 | 30 days supply | Qty: 21 | Fill #3

## 2020-03-26 MED FILL — ATORVASTATIN 40 MG TABLET: 40 | 30 days supply | Qty: 30 | Fill #9

## 2020-03-31 ENCOUNTER — Other Ambulatory Visit: Payer: Self-pay

## 2020-03-31 ENCOUNTER — Ambulatory Visit (INDEPENDENT_AMBULATORY_CARE_PROVIDER_SITE_OTHER): Payer: Self-pay | Admitting: Student

## 2020-03-31 ENCOUNTER — Encounter: Payer: Self-pay | Admitting: Student

## 2020-03-31 VITALS — BP 155/79 | HR 61 | Temp 98.3°F | Ht 64.0 in | Wt 218.2 lb

## 2020-03-31 DIAGNOSIS — IMO0002 Reserved for concepts with insufficient information to code with codable children: Secondary | ICD-10-CM

## 2020-03-31 DIAGNOSIS — L97511 Non-pressure chronic ulcer of other part of right foot limited to breakdown of skin: Secondary | ICD-10-CM

## 2020-03-31 DIAGNOSIS — E785 Hyperlipidemia, unspecified: Secondary | ICD-10-CM

## 2020-03-31 DIAGNOSIS — E1165 Type 2 diabetes mellitus with hyperglycemia: Secondary | ICD-10-CM

## 2020-03-31 DIAGNOSIS — E11621 Type 2 diabetes mellitus with foot ulcer: Secondary | ICD-10-CM

## 2020-03-31 DIAGNOSIS — R0602 Shortness of breath: Secondary | ICD-10-CM

## 2020-03-31 DIAGNOSIS — R5383 Other fatigue: Secondary | ICD-10-CM | POA: Insufficient documentation

## 2020-03-31 DIAGNOSIS — I10 Essential (primary) hypertension: Secondary | ICD-10-CM

## 2020-03-31 LAB — GLUCOSE, CAPILLARY: Glucose-Capillary: 259 mg/dL — ABNORMAL HIGH (ref 70–99)

## 2020-03-31 LAB — POCT GLYCOSYLATED HEMOGLOBIN (HGB A1C): Hemoglobin A1C: 9.2 % — AB (ref 4.0–5.6)

## 2020-03-31 NOTE — Progress Notes (Signed)
   CC: BLE numbness, tingling  HPI:  [name] is a 54 y.o. with medical history as listed below who presents to clinic with bilateral lower extremity numbness and tingling, fatigue, and elevated blood sugars.  Please see problem-based list for further details.  Past Medical History:  Diagnosis Date  . Anemia   . Diabetes mellitus type 2, controlled ([place]) 08/04/2015  . Diabetic retinopathy ([place])   . Diarrhea   . ESRD (end stage renal disease) ([place]) 03/10/2016  . High cholesterol   . Hypertension   . Nausea 08/2017   Review of Systems: As per HPI  Physical Exam: Vitals:   03/31/20 1436  BP: (!) 155/79  Pulse: 61  Temp: 98.3 F (36.8 C)  TempSrc: Oral  SpO2: 95%  Weight: 218 lb 3.2 oz (99 kg)  Height: 5\' 4"  (1.626 m)   Physical Exam Cardiovascular:     Rate and Rhythm: Normal rate and regular rhythm.     Pulses: Normal pulses.  Pulmonary:     Effort: Pulmonary effort is normal.     Breath sounds: Normal breath sounds.  Feet:     Comments: No ulcerations or skin breakdowns bilaterally. Neurological:     General: No focal deficit present.     Mental Status: He is alert.     Sensory: Sensation is intact.    Assessment & Plan:   See Encounters Tab for problem based charting.  Patient seen with Dr. Evette Doffing

## 2020-03-31 NOTE — Assessment & Plan Note (Addendum)
Patient reports increased fatigue after dialysis. States fatigue starts a couple hours after completion and lasts for about a day. He says he feels like this has acutely worsened over the last few months. Endorses SOB during this period. Daughter in room mentions he has chronic anemia. Also expresses concern that dialysis might be taking off too much fluid.  A/P: -Fatigue moost likely 2/2 anemia, poor glucose control.  -CBC, BMP, TSH today -Discussed with patient and his daughter to mention these concerns with the physician at the dialysis center to see if changes need to be made. Also discussed better glucose control can help decrease symptoms.

## 2020-03-31 NOTE — Assessment & Plan Note (Addendum)
Patient reports compliance with insulin and Trulicity. Mentions that his daily glucose readings are consistently in 300's. Daughter expresses concern over glucose goal, as she is worried his blood sugar might get too low.   A/P: -Discussed with patient and family that better diet can aide in glucose control. -Referral to diabetes educator. Appreciate assistance with medication adjustment and nutrition education. Will not add short-acting now, as patient left glucometer at home. Encouraged patient to bring glucometer to appointment with diabetes educator.  -Continue Trulicity once weekly, Lantus 35 units twice daily -A1c, BMP today

## 2020-03-31 NOTE — Assessment & Plan Note (Signed)
Patient states he doesn't have any ulcers or skin breakdowns in feet. No pain, bleeding, trauma to feet bilaterally.   A/P: -On exam, no ulcerations or skin breakdown between toes or throughout foot surface.  -Educated patient and family on importance of glucose control for wound healing and checking feet regularly for any sores, ulcers, or skin breakdown.

## 2020-03-31 NOTE — Assessment & Plan Note (Signed)
Patient reports compliance with hypertensive medications. He does not take his blood pressure regularly at home, but daughter mentions they are getting a cuff soon.  A/P: -BP 155/79 during visit. Patient had dialysis two days ago, scheduled for tomorrow. Likely 2/2 increased volume. Will continue with current home regimen. -Continue amlodipine 10, coreg 12.5, hydralazine 100

## 2020-03-31 NOTE — Patient Instructions (Addendum)
Sr./Sra.  Me alegre de verle hoy!  Durante su visita de hoy, hablamos de sus niveles de Museum/gallery exhibitions officer, que an estn elevados. Creo que [employer] niveles de azcar en sangre ayudar a Theatre stage manager entumecimiento y el hormigueo en las piernas. Por ahora, continuaremos con la misma medicacin de [place]. Hemos puesto una referencia para su educador en diabetes para ayudarlo a decidir si necesitamos agregar insulina con cada comida. Asegrese de traer su medidor de azcar en sangre (glucmetro) a esa cita!  Asegrese de seguir asistiendo a sus citas de dilisis los [place], [place] y sbados.  (During your visit today we discussed your blood sugars, which are still elevated. I think controlling your blood sugars will help the numbness and tingling in your legs. For now, we will continue with the same insulin medication. We have put in a referral for your diabetes educator to help decide if we need to add insulin with each meal. Please make sure to bring your blood sugar meter (glucometer) at that appointment!  Please make sure to continue going to your dialysis appointments on Tuesday, Thursday, and Saturday.)  Estamos deseando verle la proxima vez. [place] a [phone] si tiene preguntas o preocupaciones. El mejor tiempo para llamar es lunes a viernes, 9:00am - 4:00pm, pero hay alguien esta a su disposcion para lo que halga falta a cualquier hora. Si necesita recambio de los [employer] , llama su farmacia una semana antes de fecha de finalizacion de la receta. La farmacia nos llamara para la solicitud.  Gracias que nos permite tomar parte en su asistencia medica. Deseamos lo mejor!  [name], Dr. Sanjuan Dame, MD

## 2020-04-01 NOTE — Progress Notes (Signed)
Internal Medicine Clinic Attending  I saw and evaluated the patient.  I personally confirmed the key portions of the history and exam documented by Dr. Braswell and I reviewed pertinent patient test results.  The assessment, diagnosis, and plan were formulated together and I agree with the documentation in the resident's note.  

## 2020-04-02 LAB — LIPID PANEL
Chol/HDL Ratio: 3.9 ratio (ref 0.0–5.0)
Cholesterol, Total: 90 mg/dL — ABNORMAL LOW (ref 100–199)
HDL: 23 mg/dL — ABNORMAL LOW (ref >39)
LDL Chol Calc (NIH): 23 mg/dL (ref 0–99)
Triglycerides: 296 mg/dL — ABNORMAL HIGH (ref 0–149)
VLDL Cholesterol Cal: 44 mg/dL — ABNORMAL HIGH (ref 5–40)

## 2020-04-02 LAB — IRON AND TIBC
Iron Saturation: 41 % (ref 15–55)
Iron: 79 ug/dL (ref 38–169)
Total Iron Binding Capacity: 192 ug/dL — ABNORMAL LOW (ref 250–450)
UIBC: 113 ug/dL (ref 111–343)

## 2020-04-02 LAB — CMP14 + ANION GAP
ALT: 20 IU/L (ref 0–44)
AST: 8 IU/L (ref 0–40)
Albumin/Globulin Ratio: 2.2 (ref 1.2–2.2)
Albumin: 4.9 g/dL (ref 3.8–4.9)
Alkaline Phosphatase: 91 IU/L (ref 44–121)
Anion Gap: 17 mmol/L (ref 10.0–18.0)
BUN/Creatinine Ratio: 6 — ABNORMAL LOW (ref 9–20)
BUN: 51 mg/dL — ABNORMAL HIGH (ref 6–24)
Bilirubin Total: 0.4 mg/dL (ref 0.0–1.2)
CO2: 22 mmol/L (ref 20–29)
Calcium: 10.2 mg/dL (ref 8.7–10.2)
Chloride: 95 mmol/L — ABNORMAL LOW (ref 96–106)
Creatinine, Ser: 8.75 mg/dL — ABNORMAL HIGH (ref 0.76–1.27)
GFR calc Af Amer: 7 mL/min/{1.73_m2} — ABNORMAL LOW (ref >59)
GFR calc non Af Amer: 6 mL/min/{1.73_m2} — ABNORMAL LOW (ref >59)
Globulin, Total: 2.2 g/dL (ref 1.5–4.5)
Glucose: 252 mg/dL — ABNORMAL HIGH (ref 65–99)
Potassium: 5.3 mmol/L — ABNORMAL HIGH (ref 3.5–5.2)
Sodium: 134 mmol/L (ref 134–144)
Total Protein: 7.1 g/dL (ref 6.0–8.5)

## 2020-04-02 LAB — CBC
Hematocrit: 32.6 % — ABNORMAL LOW (ref 37.5–51.0)
Hemoglobin: 10.8 g/dL — ABNORMAL LOW (ref 13.0–17.7)
MCH: 30.5 pg (ref 26.6–33.0)
MCHC: 33.1 g/dL (ref 31.5–35.7)
MCV: 92 fL (ref 79–97)
Platelets: 111 10*3/uL — ABNORMAL LOW (ref 150–450)
RBC: 3.54 x10E6/uL — ABNORMAL LOW (ref 4.14–5.80)
RDW: 14.3 % (ref 11.6–15.4)
WBC: 9.6 10*3/uL (ref 3.4–10.8)

## 2020-04-02 LAB — TSH: TSH: 2.54 u[IU]/mL (ref 0.450–4.500)

## 2020-04-02 NOTE — Progress Notes (Signed)
Discussed results with niece, Magda Paganini.  Discussed importance of better diet for control of triglycerides, diabetes, electrolytes. Mentioned he has referral for diabetes educator, which can help control his blood sugars and discuss diet. Also encouraged use of omega 3 fatty acids for triglycerides. Labs drawn on Monday, day before dialysis, likely contributing to increase in creatinine, potassium.

## 2020-04-03 ENCOUNTER — Other Ambulatory Visit: Payer: Self-pay

## 2020-04-03 ENCOUNTER — Ambulatory Visit (HOSPITAL_COMMUNITY)
Admission: EM | Admit: 2020-04-03 | Discharge: 2020-04-03 | Disposition: A | Discharge status: Home / Self Care | Payer: HRSA Program | Attending: Urgent Care | Admitting: Urgent Care

## 2020-04-03 ENCOUNTER — Encounter (HOSPITAL_COMMUNITY): Payer: Self-pay | Admitting: Emergency Medicine

## 2020-04-03 DIAGNOSIS — B9789 Other viral agents as the cause of diseases classified elsewhere: Secondary | ICD-10-CM

## 2020-04-03 DIAGNOSIS — Z992 Dependence on renal dialysis: Secondary | ICD-10-CM | POA: Diagnosis not present

## 2020-04-03 DIAGNOSIS — Z87891 Personal history of nicotine dependence: Secondary | ICD-10-CM | POA: Diagnosis not present

## 2020-04-03 DIAGNOSIS — J069 Acute upper respiratory infection, unspecified: Secondary | ICD-10-CM | POA: Diagnosis not present

## 2020-04-03 DIAGNOSIS — Z794 Long term (current) use of insulin: Secondary | ICD-10-CM | POA: Insufficient documentation

## 2020-04-03 DIAGNOSIS — R059 Cough, unspecified: Secondary | ICD-10-CM

## 2020-04-03 DIAGNOSIS — E7800 Pure hypercholesterolemia, unspecified: Secondary | ICD-10-CM | POA: Diagnosis not present

## 2020-04-03 DIAGNOSIS — U071 COVID-19: Secondary | ICD-10-CM | POA: Insufficient documentation

## 2020-04-03 DIAGNOSIS — Z7901 Long term (current) use of anticoagulants: Secondary | ICD-10-CM | POA: Insufficient documentation

## 2020-04-03 DIAGNOSIS — J988 Other specified respiratory disorders: Secondary | ICD-10-CM

## 2020-04-03 DIAGNOSIS — N186 End stage renal disease: Secondary | ICD-10-CM | POA: Diagnosis not present

## 2020-04-03 DIAGNOSIS — R05 Cough: Secondary | ICD-10-CM | POA: Insufficient documentation

## 2020-04-03 DIAGNOSIS — E11319 Type 2 diabetes mellitus with unspecified diabetic retinopathy without macular edema: Secondary | ICD-10-CM | POA: Diagnosis not present

## 2020-04-03 DIAGNOSIS — I120 Hypertensive chronic kidney disease with stage 5 chronic kidney disease or end stage renal disease: Secondary | ICD-10-CM | POA: Insufficient documentation

## 2020-04-03 DIAGNOSIS — R0981 Nasal congestion: Secondary | ICD-10-CM

## 2020-04-03 DIAGNOSIS — Z79899 Other long term (current) drug therapy: Secondary | ICD-10-CM | POA: Insufficient documentation

## 2020-04-03 DIAGNOSIS — E1122 Type 2 diabetes mellitus with diabetic chronic kidney disease: Secondary | ICD-10-CM | POA: Diagnosis not present

## 2020-04-03 LAB — SARS CORONAVIRUS 2 (TAT 6-24 HRS): SARS Coronavirus 2: POSITIVE — AB

## 2020-04-03 NOTE — ED Triage Notes (Signed)
Pts niece is interpreting for him. She states the dialysis center sent him to be covid tested due to persistent cough, fever, and congestion onset 2 days. Pts niece states his fever was 101 around 3 this morning, last tylenol was around 6 am. He feels fatigued. Denies nausea or diarrhea.

## 2020-04-03 NOTE — ED Provider Notes (Signed)
[place]   MRN: [id number] DOB: 1966-01-23  Subjective:   Eric Glenn is a 54 y.o. male presenting for 2-day history of persistent fever, cough, nasal congestion.  Patient presents with his niece who reports he had a fever of 101 this morning, responded well to Tylenol.  Patient is a dialysis patient, was sent for COVID-19 testing for appropriate continuation of his dialysis treatment.  Denies chest pain, shortness of breath, body aches.  Patient has not been vaccinated against COVID-19.  No current facility-administered medications for this encounter.  Current Outpatient Medications:  .  acetaminophen (TYLENOL) 325 MG tablet, Take 325-650 mg by mouth every 6 (six) hours as needed for mild pain or headache., Disp: , Rfl:  .  amLODipine (NORVASC) 10 MG tablet, Take 1 tablet (10 mg total) by mouth daily. (Patient taking differently: Take 10 mg by mouth at bedtime. ), Disp: 90 tablet, Rfl: 3 .  atorvastatin (LIPITOR) 40 MG tablet, Take 1 tablet (40 mg total) by mouth daily. (Patient taking differently: Take 40 mg by mouth at bedtime. ), Disp: 90 tablet, Rfl: 3 .  carvedilol (COREG) 12.5 MG tablet, TAKE 1 TABLET (12.5 MG TOTAL) BY MOUTH 2 (TWO) TIMES DAILY., Disp: 60 tablet, Rfl: 3 .  dextromethorphan-guaiFENesin (MUCINEX DM) 30-600 MG 12hr tablet, Take 1 tablet by mouth 2 (two) times daily., Disp: 20 tablet, Rfl: 0 .  Dulaglutide (TRULICITY) 1.5 UK/0.2RK SOPN, Inject 1.5 mg into the skin once a week. (Patient taking differently: Inject 1.5 mg into the skin every Monday. ), Disp: 2 mL, Rfl: 3 .  glucose blood (CONTOUR NEXT TEST) test strip, Test blood glucose 4 times daily, Disp: 100 each, Rfl: 11 .  hydrALAZINE (APRESOLINE) 100 MG tablet, Take 1 tablet (100 mg total) by mouth 3 (three) times daily., Disp: 90 tablet, Rfl: 10 .  insulin glargine (LANTUS) 100 UNIT/ML Solostar Pen, Inject 35 Units into the skin 2 (two) times daily., Disp: 15 mL, Rfl: 6 .  Insulin Pen  Needle (UNIFINE PENTIPS) 32G X 4 MM MISC, USE 2 NEEDLES TO INJECT INSULIN ONCE A DAY, Disp: 100 each, Rfl: 12 .  Lancets MISC, Test blood glucose 4 times daily, Disp: 100 each, Rfl: 11 .  multivitamin (RENA-VIT) TABS tablet, Take 1 tablet by mouth daily., Disp: 60 tablet, Rfl: 5 .  Omega-3 Fatty Acids (FISH OIL) 1000 MG CAPS, Take 1,000 mg by mouth in the morning and at bedtime., Disp: , Rfl:  .  senna-docusate (SENOKOT S) 8.6-50 MG tablet, Take 1 tablet by mouth daily. (Patient taking differently: Take 1 tablet by mouth daily as needed for mild constipation. ), Disp: 30 tablet, Rfl: 0 .  triamcinolone cream (KENALOG) 0.1 %, Apply 1 application topically 2 (two) times daily., Disp: 15 g, Rfl: 2 .  fluticasone (FLONASE) 50 MCG/ACT nasal spray, Place 1-2 sprays into both nostrils daily for 7 days., Disp: 1 g, Rfl: 0   Allergies  Allergen Reactions  . Other Other (See Comments) and Rash    Raises blood pressure  . Poractant Alfa Rash and Other (See Comments)    Raises blood pressure  . Pork-Derived Products Rash and Other (See Comments)    Raises blood pressure  . Penicillins Hives    Past Medical History:  Diagnosis Date  . Anemia   . Diabetes mellitus type 2, controlled ([place]) 08/04/2015  . Diabetic retinopathy ([place])   . Diarrhea   . ESRD (end stage renal disease) ([place]) 03/10/2016  . High cholesterol   .  Hypertension   . Nausea 08/2017     Past Surgical History:  Procedure Laterality Date  . AV FISTULA PLACEMENT Right 09/04/2015   Procedure: ARTERIOVENOUS (AV) FISTULA CREATION;  Surgeon: Angelia Mould, MD;  Location: [facility];  Service: Vascular;  Laterality: Right;  . AV FISTULA PLACEMENT Right 01/22/2016   Procedure: RIGHT UPPER ARM BRACHIOCEPHALIC ARTERIOVENOUS (AV) FISTULA CREATION;  Surgeon: Angelia Mould, MD;  Location: [facility];  Service: Vascular;  Laterality: Right;  . IR DIALY SHUNT INTRO [place] W/IMG RIGHT Right 12/08/2018  . LIGATION OF  ARTERIOVENOUS  FISTULA Right 01/22/2016   Procedure: LIGATION OF RIGHT FOREARM ARTERIOVENOUS  FISTULA;  Surgeon: Angelia Mould, MD;  Location: [facility];  Service: Vascular;  Laterality: Right;  . PERIPHERAL VASCULAR CATHETERIZATION N/A 01/12/2016   Procedure: Fistulagram;  Surgeon: Angelia Mould, MD;  Location: [facility];  Service: Cardiovascular;  Laterality: N/A;  . UMBILICAL HERNIA REPAIR      Family History  Problem Relation Age of Onset  . Hypertension Mother   . Hyperlipidemia Mother   . Diabetes Mellitus II Sister     Social History   Tobacco Use  . Smoking status: Former Smoker    Quit date: 10/22/2003    Years since quitting: 16.4  . Smokeless tobacco: Never Used  Vaping Use  . Vaping Use: Never used  Substance Use Topics  . Alcohol use: No    Alcohol/week: 0.0 standard drinks  . Drug use: No    ROS   Objective:   Vitals: BP 135/87 (BP Location: Left Arm)   Pulse 62   Temp 98.4 F (36.9 C) (Oral)   Resp 16   SpO2 99%   Physical Exam Constitutional:      General: He is not in acute distress.    Appearance: Normal appearance. He is well-developed. He is not ill-appearing, toxic-appearing or diaphoretic.  HENT:     Head: Normocephalic and atraumatic.     Right Ear: External ear normal.     Left Ear: External ear normal.     Nose: Nose normal.     Mouth/Throat:     Mouth: Mucous membranes are moist.     Pharynx: Oropharynx is clear.  Eyes:     General: No scleral icterus.    Extraocular Movements: Extraocular movements intact.     Pupils: Pupils are equal, round, and reactive to light.  Cardiovascular:     Rate and Rhythm: Normal rate and regular rhythm.     Heart sounds: Normal heart sounds. No murmur heard.  No friction rub. No gallop.   Pulmonary:     Effort: Pulmonary effort is normal. No respiratory distress.     Breath sounds: Normal breath sounds. No stridor. No wheezing, rhonchi or rales.  Neurological:     Mental Status:  He is alert and oriented to person, place, and time.  Psychiatric:        Mood and Affect: Mood normal.        Behavior: Behavior normal.        Thought Content: Thought content normal.       Assessment and Plan :   PDMP not reviewed this encounter.  1. Viral respiratory illness   2. Cough   3. Nasal congestion   4. ESRD on dialysis [facility])     Will manage for viral illness such as viral URI, viral syndrome, viral rhinitis, COVID-19. Counseled patient on nature of COVID-19 including modes of transmission, diagnostic testing,  management and supportive care.  Offered scripts for symptomatic relief. COVID 19 testing is pending. Counseled patient on potential for adverse effects with medications prescribed/recommended today, ER and return-to-clinic precautions discussed, patient verbalized understanding.     Jaynee Eagles, PA-C 04/03/20 1057

## 2020-04-04 ENCOUNTER — Other Ambulatory Visit: Payer: Self-pay | Admitting: Unknown Physician Specialty

## 2020-04-04 ENCOUNTER — Telehealth: Payer: Self-pay | Admitting: Unknown Physician Specialty

## 2020-04-04 DIAGNOSIS — N185 Chronic kidney disease, stage 5: Secondary | ICD-10-CM

## 2020-04-04 DIAGNOSIS — E1365 Other specified diabetes mellitus with hyperglycemia: Secondary | ICD-10-CM

## 2020-04-04 DIAGNOSIS — U071 COVID-19: Secondary | ICD-10-CM

## 2020-04-04 NOTE — Telephone Encounter (Signed)
I connected by phone with Ronnald Collum niece on 04/04/2020 at 2:43 PM to discuss the potential use of a new treatment for mild to moderate COVID-19 viral infection in non-hospitalized patients.  This patient is a 54 y.o. male that meets the FDA criteria for Emergency Use Authorization of COVID monoclonal antibody casirivimab/imdevimab.  Has a (+) direct SARS-CoV-2 viral test result  Has mild or moderate COVID-19   Is NOT hospitalized due to COVID-19  Is within 10 days of symptom onset  Has at least one of the high risk factor(s) for progression to severe COVID-19 and/or hospitalization as defined in EUA.  Specific high risk criteria : BMI > 25, Chronic Kidney Disease (CKD), Diabetes and Cardiovascular disease or hypertension   I have spoken and communicated the following to the patient or parent/caregiver regarding COVID monoclonal antibody treatment:  1. FDA has authorized the emergency use for the treatment of mild to moderate COVID-19 in adults and pediatric patients with positive results of direct SARS-CoV-2 viral testing who are 47 years of age and older weighing at least 40 kg, and who are at high risk for progressing to severe COVID-19 and/or hospitalization.  2. The significant known and potential risks and benefits of COVID monoclonal antibody, and the extent to which such potential risks and benefits are unknown.  3. Information on available alternative treatments and the risks and benefits of those alternatives, including clinical trials.  4. Patients treated with COVID monoclonal antibody should continue to self-isolate and use infection control measures (e.g., wear mask, isolate, social distance, avoid sharing personal items, clean and disinfect "high touch" surfaces, and frequent handwashing) according to CDC guidelines.   5. The patient or parent/caregiver has the option to accept or refuse COVID monoclonal antibody treatment.  After reviewing this information  with the patient's niece, The patient agreed to proceed with receiving casirivimab\imdevimab infusion and will be provided a copy of the Fact sheet prior to receiving the infusion. Kathrine Haddock 04/04/2020 2:43 PM  E mailing the following info:   [place] have been scheduled to receive Regeneron (the monoclonal antibody we discussed) on : Sunday at 9:30  If you have been tested outside of a [facility] - you MUST bring a copy of your positive test with you the morning of your appointment. You may take a photo of this and upload to your MyChart portal or have the testing facility fax the result to [phone]    The address for the infusion clinic site is:  --GPS address is [place] - the parking is located near [employer] building where you will see  COVID19 Infusion feather banner marking the entrance to parking.   (see photos below)            --Enter into the 2nd entrance where the "wave, flag banner" is at the road. Turn into this 2nd entrance and immediately turn left to park in 1 of the 5 parking spots.   --Please stay in your car and call the desk for assistance inside [phone].   --Average time in department is roughly 2 hours for Regeneron treatment - this includes preparation of the medication, IV start and the required 1 hour monitoring after the infusion.    Should you develop worsening shortness of breath, chest pain or severe breathing problems please do not wait for this appointment and go to the Emergency room for evaluation and treatment. You will undergo another oxygen screen before your  infusion to ensure this is the best treatment option for you. There is a chance that the best decision may be to send you to the Emergency Room for evaluation at the time of your appointment.   The day of your visit you should: Marland Kitchen Get plenty of rest the night before and drink plenty of water . Eat a light meal/snack before coming  and take your medications as prescribed  . Wear warm, comfortable clothes with a shirt that can roll-up over the elbow (will need IV start).  . Wear a mask  . Consider bringing some activity to help pass the time  Many commercial insurers are waiving bills related to [place] treatment however some have ranged from $300-640. We are starting to see some insurers send bills to patients later for the administration of the medication - we are learning more information but you may receive a bill after your appointment.  Please contact your insurance agent to discuss prior to your appointment if you would like further details about billing specific to your policy.    The CPT code is [phone] for your reference.    I hope this helps find you feeling better,  Kathrine Haddock

## 2020-04-06 ENCOUNTER — Ambulatory Visit (HOSPITAL_COMMUNITY)
Admission: RE | Admit: 2020-04-06 | Discharge: 2020-04-06 | Disposition: A | Discharge status: Home / Self Care | Payer: HRSA Program | Source: Ambulatory Visit | Attending: Pulmonary Disease | Admitting: Pulmonary Disease

## 2020-04-06 DIAGNOSIS — U071 COVID-19: Secondary | ICD-10-CM | POA: Insufficient documentation

## 2020-04-06 DIAGNOSIS — E1365 Other specified diabetes mellitus with hyperglycemia: Secondary | ICD-10-CM | POA: Diagnosis present

## 2020-04-06 DIAGNOSIS — N185 Chronic kidney disease, stage 5: Secondary | ICD-10-CM | POA: Insufficient documentation

## 2020-04-06 DIAGNOSIS — I120 Hypertensive chronic kidney disease with stage 5 chronic kidney disease or end stage renal disease: Secondary | ICD-10-CM | POA: Diagnosis present

## 2020-04-06 MED ORDER — METHYLPREDNISOLONE SODIUM SUCC 125 MG IJ SOLR: 125.0000 mg | Freq: Once | INTRAMUSCULAR | Status: DC | PRN

## 2020-04-06 MED ORDER — DIPHENHYDRAMINE HCL 50 MG/ML IJ SOLN: 50.0000 mg | Freq: Once | INTRAMUSCULAR | Status: DC | PRN

## 2020-04-06 MED ORDER — ALBUTEROL SULFATE HFA 108 (90 BASE) MCG/ACT IN AERS: 2.0000 | INHALATION_SPRAY | Freq: Once | RESPIRATORY_TRACT | Status: DC | PRN

## 2020-04-06 MED ORDER — LOPERAMIDE HCL 2 MG PO CAPS
4.0000 mg | ORAL_CAPSULE | Freq: Once | ORAL | Status: AC
  Administered 2020-04-06: 4 mg via ORAL
  Filled 2020-04-06 (×2): qty 2

## 2020-04-06 MED ORDER — SODIUM CHLORIDE 0.9 % IV SOLN
1200.0000 mg | Freq: Once | INTRAVENOUS | Status: AC
  Administered 2020-04-06: 1200 mg via INTRAVENOUS

## 2020-04-06 MED ORDER — SODIUM CHLORIDE 0.9 % IV SOLN: INTRAVENOUS | Status: DC | PRN

## 2020-04-06 MED ORDER — FAMOTIDINE IN NACL 20-0.9 MG/50ML-% IV SOLN: 20.0000 mg | Freq: Once | INTRAVENOUS | Status: DC | PRN

## 2020-04-06 MED ORDER — EPINEPHRINE 0.3 MG/0.3ML IJ SOAJ: 0.3000 mg | Freq: Once | INTRAMUSCULAR | Status: DC | PRN

## 2020-04-06 NOTE — Discharge Instructions (Signed)

## 2020-04-06 NOTE — Progress Notes (Signed)
  Diagnosis: COVID-19  Physician: Dr. Joya Gaskins   Procedure: Covid Infusion Clinic Med: casirivimab\imdevimab infusion - Provided patient with casirivimab\imdevimab fact sheet for patients, parents and caregivers prior to infusion.  Complications: No immediate complications noted. Patient did experience diarrhea while here, and had to be cleaned up and paper scrubs were given to the patient. Katie Pa also prescribed imodium for the patient  Discharge: Discharged home   Claudia Desanctis 04/06/2020

## 2020-04-07 ENCOUNTER — Telehealth: Payer: Self-pay | Admitting: Infectious Diseases

## 2020-04-07 NOTE — Telephone Encounter (Signed)
Error

## 2020-04-28 ENCOUNTER — Other Ambulatory Visit: Payer: Self-pay | Admitting: Internal Medicine

## 2020-04-28 DIAGNOSIS — I10 Essential (primary) hypertension: Secondary | ICD-10-CM

## 2020-04-28 MED ORDER — CARVEDILOL 12.5 MG PO TABS: 12.5000 mg | ORAL_TABLET | Freq: Two times a day (BID) | ORAL | 3 refills | Status: DC

## 2020-04-28 MED FILL — AMLODIPINE BESYLATE 10 MG T: 10 | 30 days supply | Qty: 30 | Fill #9

## 2020-04-28 MED FILL — LANTUS SOLOSTAR 100 UNITS/M: 100 | 30 days supply | Qty: 21 | Fill #4

## 2020-04-28 MED FILL — hydrALAZINE HCL 100 MG TABS: 100 | 30 days supply | Qty: 90 | Fill #9

## 2020-04-28 MED FILL — CARVEDILOL 12.5 MG TABLET: 12.5 | 30 days supply | Qty: 60 | Fill #0

## 2020-04-28 MED FILL — UNIFINE PENTIPS 32GX5/32: 32G X 4 MM | 30 days supply | Qty: 100 | Fill #1

## 2020-04-28 MED FILL — ATORVASTATIN 40 MG TABLET: 40 | 30 days supply | Qty: 30 | Fill #10

## 2020-04-28 NOTE — Addendum Note (Signed)
Addended by: Harvie Heck on: 04/28/2020 12:20 PM   Modules accepted: Orders

## 2020-05-07 NOTE — Addendum Note (Signed)
Addended by: Hulan Fray on: 05/07/2020 09:37 AM   Modules accepted: Orders

## 2020-05-26 MED FILL — ATORVASTATIN 40 MG TABLET: 40 | 30 days supply | Qty: 30 | Fill #11

## 2020-05-26 MED FILL — AMLODIPINE BESYLATE 10 MG T: 10 | 30 days supply | Qty: 30 | Fill #10

## 2020-06-06 ENCOUNTER — Other Ambulatory Visit: Payer: Self-pay | Admitting: Internal Medicine

## 2020-06-06 ENCOUNTER — Encounter: Payer: Self-pay | Admitting: Internal Medicine

## 2020-06-06 DIAGNOSIS — IMO0002 Reserved for concepts with insufficient information to code with codable children: Secondary | ICD-10-CM

## 2020-06-06 DIAGNOSIS — E1165 Type 2 diabetes mellitus with hyperglycemia: Secondary | ICD-10-CM

## 2020-06-06 MED ORDER — INSULIN GLARGINE 100 UNIT/ML SOLOSTAR PEN: 35.0000 [IU] | PEN_INJECTOR | Freq: Two times a day (BID) | SUBCUTANEOUS | 6 refills | Status: DC

## 2020-06-06 MED ORDER — UNIFINE PENTIPS 32G X 4 MM MISC: 12 refills | Status: DC

## 2020-06-06 MED FILL — UNIFINE PENTIPS 32GX5/32: 32G X 4 MM | 30 days supply | Qty: 100 | Fill #0

## 2020-06-06 MED FILL — LANTUS SOLOSTAR 100 UNITS/M: 100 | 30 days supply | Qty: 21 | Fill #0

## 2020-06-06 MED FILL — CARVEDILOL 12.5 MG TABLET: 12.5 | 30 days supply | Qty: 60 | Fill #1

## 2020-07-01 ENCOUNTER — Other Ambulatory Visit: Payer: Self-pay | Admitting: Internal Medicine

## 2020-07-01 ENCOUNTER — Other Ambulatory Visit: Payer: Self-pay | Admitting: Student

## 2020-07-01 DIAGNOSIS — I10 Essential (primary) hypertension: Secondary | ICD-10-CM

## 2020-07-01 MED FILL — UNIFINE PENTIPS 32GX5/32: 32G X 4 MM | 30 days supply | Qty: 100 | Fill #2

## 2020-07-01 MED FILL — hydrALAZINE HCL 100 MG TABS: 100 | 30 days supply | Qty: 90 | Fill #0

## 2020-07-01 MED FILL — LANTUS SOLOSTAR 100 UNITS/M: 100 | 30 days supply | Qty: 21 | Fill #1

## 2020-07-01 MED FILL — AMLODIPINE BESYLATE 10 MG T: 10 | 30 days supply | Qty: 30 | Fill #0

## 2020-07-01 MED FILL — CARVEDILOL 12.5 MG TABLET: 12.5 | 30 days supply | Qty: 60 | Fill #2

## 2020-08-01 ENCOUNTER — Other Ambulatory Visit: Payer: Self-pay | Admitting: Student

## 2020-08-01 ENCOUNTER — Other Ambulatory Visit: Payer: Self-pay | Admitting: Internal Medicine

## 2020-08-01 DIAGNOSIS — E1165 Type 2 diabetes mellitus with hyperglycemia: Secondary | ICD-10-CM

## 2020-08-01 DIAGNOSIS — IMO0002 Reserved for concepts with insufficient information to code with codable children: Secondary | ICD-10-CM

## 2020-08-01 MED FILL — LANTUS SOLOSTAR 100 UNITS/M: 100 | 30 days supply | Qty: 21 | Fill #2

## 2020-08-01 MED FILL — hydrALAZINE HCL 100 MG TABS: 100 | 30 days supply | Qty: 90 | Fill #1

## 2020-08-01 MED FILL — ATORVASTATIN 40 MG TABLET: 40 | 30 days supply | Qty: 30 | Fill #0

## 2020-08-01 MED FILL — UNIFINE PENTIPS 32GX5/32: 32G X 4 MM | 30 days supply | Qty: 100 | Fill #3

## 2020-08-01 MED FILL — CARVEDILOL 12.5 MG TABLET: 12.5 | 30 days supply | Qty: 60 | Fill #3

## 2020-08-01 MED FILL — AMLODIPINE BESYLATE 10 MG T: 10 | 30 days supply | Qty: 30 | Fill #1

## 2020-08-13 ENCOUNTER — Other Ambulatory Visit (HOSPITAL_COMMUNITY): Payer: Self-pay | Admitting: Nephrology

## 2020-08-13 DIAGNOSIS — N186 End stage renal disease: Secondary | ICD-10-CM

## 2020-08-14 ENCOUNTER — Other Ambulatory Visit: Payer: Self-pay | Admitting: Radiology

## 2020-08-18 ENCOUNTER — Ambulatory Visit (HOSPITAL_COMMUNITY)
Admission: RE | Admit: 2020-08-18 | Discharge: 2020-08-18 | Disposition: A | Discharge status: Home / Self Care | Payer: Self-pay | Source: Ambulatory Visit | Attending: Nephrology | Admitting: Nephrology

## 2020-08-18 ENCOUNTER — Other Ambulatory Visit: Payer: Self-pay

## 2020-08-18 ENCOUNTER — Encounter (HOSPITAL_COMMUNITY): Payer: Self-pay

## 2020-08-18 DIAGNOSIS — Z91014 Allergy to mammalian meats: Secondary | ICD-10-CM | POA: Insufficient documentation

## 2020-08-18 DIAGNOSIS — Z794 Long term (current) use of insulin: Secondary | ICD-10-CM | POA: Insufficient documentation

## 2020-08-18 DIAGNOSIS — T82510A Breakdown (mechanical) of surgically created arteriovenous fistula, initial encounter: Secondary | ICD-10-CM | POA: Insufficient documentation

## 2020-08-18 DIAGNOSIS — E1122 Type 2 diabetes mellitus with diabetic chronic kidney disease: Secondary | ICD-10-CM | POA: Insufficient documentation

## 2020-08-18 DIAGNOSIS — Y841 Kidney dialysis as the cause of abnormal reaction of the patient, or of later complication, without mention of misadventure at the time of the procedure: Secondary | ICD-10-CM | POA: Insufficient documentation

## 2020-08-18 DIAGNOSIS — Z880 Allergy status to penicillin: Secondary | ICD-10-CM | POA: Insufficient documentation

## 2020-08-18 DIAGNOSIS — I120 Hypertensive chronic kidney disease with stage 5 chronic kidney disease or end stage renal disease: Secondary | ICD-10-CM | POA: Insufficient documentation

## 2020-08-18 DIAGNOSIS — N186 End stage renal disease: Secondary | ICD-10-CM | POA: Insufficient documentation

## 2020-08-18 DIAGNOSIS — Z87891 Personal history of nicotine dependence: Secondary | ICD-10-CM | POA: Insufficient documentation

## 2020-08-18 DIAGNOSIS — Z79899 Other long term (current) drug therapy: Secondary | ICD-10-CM | POA: Insufficient documentation

## 2020-08-18 DIAGNOSIS — Z992 Dependence on renal dialysis: Secondary | ICD-10-CM | POA: Insufficient documentation

## 2020-08-18 HISTORY — PX: IR DIALY SHUNT INTRO NEEDLE/INTRACATH INITIAL W/IMG RIGHT: IMG6115

## 2020-08-18 MED ORDER — IOHEXOL 300 MG/ML  SOLN
150.0000 mL | Freq: Once | INTRAMUSCULAR | Status: AC | PRN
  Administered 2020-08-18: 40 mL via INTRA_ARTERIAL

## 2020-08-18 MED ORDER — SODIUM CHLORIDE 0.9 % IV SOLN: INTRAVENOUS | Status: DC
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
## 2020-08-18 NOTE — Procedures (Signed)
Interventional Radiology Procedure Note  Procedure: Fistulagram  Findings: Please refer to procedural dictation for full description. Right brachiocephalic AV fistula with patent anastomosis, aneurysmal puncture zone, patent cephalic vein outflow and central veins.  Mild stenosis about central aspect of puncture zone, non-flow limiting.  No intervention warranted.  Complications: None immediate  Estimated Blood Loss: < 5 mL  Recommendations: Continue use of RUE AVF as needed for HD.  Recommend marking adequate puncture sites at HD facility, as per the patient's report the malfunctions tend to occur with new staff puncturing the fistula.   Ruthann Cancer, MD

## 2020-09-01 ENCOUNTER — Other Ambulatory Visit: Payer: Self-pay | Admitting: Student

## 2020-09-01 ENCOUNTER — Other Ambulatory Visit: Payer: Self-pay | Admitting: Internal Medicine

## 2020-09-01 DIAGNOSIS — I10 Essential (primary) hypertension: Secondary | ICD-10-CM

## 2020-09-01 MED FILL — AMLODIPINE BESYLATE 10 MG T: 10 | 30 days supply | Qty: 30 | Fill #2

## 2020-09-02 MED FILL — CARVEDILOL 12.5 MG TABLET: 12.5 | 30 days supply | Qty: 60 | Fill #0

## 2020-09-08 ENCOUNTER — Other Ambulatory Visit: Payer: Self-pay

## 2020-09-08 ENCOUNTER — Ambulatory Visit (HOSPITAL_COMMUNITY)
Admission: RE | Admit: 2020-09-08 | Discharge: 2020-09-08 | Disposition: A | Discharge status: Home / Self Care | Payer: Self-pay | Source: Ambulatory Visit | Attending: Internal Medicine | Admitting: Internal Medicine

## 2020-09-08 ENCOUNTER — Ambulatory Visit (INDEPENDENT_AMBULATORY_CARE_PROVIDER_SITE_OTHER): Payer: Self-pay

## 2020-09-08 ENCOUNTER — Telehealth: Payer: Self-pay | Admitting: *Deleted

## 2020-09-08 ENCOUNTER — Encounter (HOSPITAL_COMMUNITY): Payer: Self-pay

## 2020-09-08 VITALS — BP 160/78 | HR 66 | Temp 98.3°F | Resp 18

## 2020-09-08 DIAGNOSIS — R0602 Shortness of breath: Secondary | ICD-10-CM

## 2020-09-08 DIAGNOSIS — Z992 Dependence on renal dialysis: Secondary | ICD-10-CM

## 2020-09-08 DIAGNOSIS — M940 Chondrocostal junction syndrome [Tietze]: Secondary | ICD-10-CM

## 2020-09-08 DIAGNOSIS — J4521 Mild intermittent asthma with (acute) exacerbation: Secondary | ICD-10-CM

## 2020-09-08 DIAGNOSIS — E1169 Type 2 diabetes mellitus with other specified complication: Secondary | ICD-10-CM

## 2020-09-08 DIAGNOSIS — Z794 Long term (current) use of insulin: Secondary | ICD-10-CM

## 2020-09-08 DIAGNOSIS — N186 End stage renal disease: Secondary | ICD-10-CM

## 2020-09-08 DIAGNOSIS — R531 Weakness: Secondary | ICD-10-CM

## 2020-09-08 DIAGNOSIS — M549 Dorsalgia, unspecified: Secondary | ICD-10-CM

## 2020-09-08 HISTORY — DX: Dependence on renal dialysis: Z99.2

## 2020-09-08 HISTORY — DX: Type 2 diabetes mellitus without complications: E11.9

## 2020-09-08 MED ORDER — ALBUTEROL SULFATE HFA 108 (90 BASE) MCG/ACT IN AERS
2.0000 | INHALATION_SPRAY | Freq: Once | RESPIRATORY_TRACT | Status: AC
  Administered 2020-09-08: 2 via RESPIRATORY_TRACT

## 2020-09-08 MED ORDER — ALBUTEROL SULFATE HFA 108 (90 BASE) MCG/ACT IN AERS
INHALATION_SPRAY | RESPIRATORY_TRACT | Status: AC
  Filled 2020-09-08: qty 6.7

## 2020-09-08 NOTE — Telephone Encounter (Signed)
Call from pt's niece, [name], pt c/o sob, "discomfort in his chest" and left arm weakness x 2 days. Instructed pt needs to go to the th ED now. She stated the pt thinks it's not urgent; I informed her with these symptoms, he needs to go to the ED now. She stated ok.

## 2020-09-08 NOTE — Telephone Encounter (Signed)
agree

## 2020-09-08 NOTE — ED Triage Notes (Signed)
Pt brought in by daughter with c/o of SOB, upper back pain and weakness x 9 days. Denies fever.

## 2020-09-08 NOTE — ED Provider Notes (Signed)
[facility]    CSN: [id number] Arrival date & time: 09/08/20  1740      History   Chief Complaint Chief Complaint  Patient presents with  . Shortness of Breath  . Back Pain  . Fatigue    HPI Eric Glenn is a 55 y.o. male presenting with shortness of breath, back pain, and fatigue. History diabetes, ESRD on dialysis, asthma, hypertension, chronic sinusitis. Pt brought in by daughter with c/o of SOB, upper back pain and weakness x 9 days. Chest wall pain with deep inspiration; denies chest pain at rest, pain down arms, substernal pain, etc. History asthma that has been controlled with inhalers previously but currently on no medication for this. Denies fevers. Denies recent URI symptoms. Not vaccinated for covid. For diabetes- sugars not well controlled.   HPI  Past Medical History:  Diagnosis Date  . Anemia   . Diabetes ([place])   . Diabetes mellitus type 2, controlled ([place]) 08/04/2015  . Diabetic retinopathy ([place])   . Diabetic retinopathy ([place])   . Dialysis patient ([place])   . Diarrhea   . ESRD (end stage renal disease) ([place]) 03/10/2016  . High cholesterol   . Hypertension   . Nausea 08/2017    Patient Active Problem List   Diagnosis Date Noted  . Fatigue 03/31/2020  . Dermatitis of lower extremity 12/03/2019  . Ulcer of right foot, limited to breakdown of skin ([place]) 11/28/2019  . 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 Bilateral leg cramps 08/27/2019  . Chronic idiopathic constipation 05/29/2019  . ESRD (end stage renal disease) ([place]) 03/10/2016  . Nasal septal deviation 12/10/2015  . Asthma 10/01/2015  . Hypersomnia 10/01/2015  . Obesity 10/01/2015  . Normocytic anemia 08/28/2015  . Hyperlipidemia 08/19/2015  . Sinusitis, chronic 08/19/2015  . Essential hypertension 08/04/2015  . Uncontrolled type 2 diabetes mellitus with complication ([place]) [id number]  . Diabetic retinopathy associated with type 2  diabetes mellitus ([place]) 08/04/2015    Past Surgical History:  Procedure Laterality Date  . AV FISTULA PLACEMENT Right 09/04/2015   Procedure: ARTERIOVENOUS (AV) FISTULA CREATION;  Surgeon: Angelia Mould, MD;  Location: [facility];  Service: Vascular;  Laterality: Right;  . AV FISTULA PLACEMENT Right 01/22/2016   Procedure: RIGHT UPPER ARM BRACHIOCEPHALIC ARTERIOVENOUS (AV) FISTULA CREATION;  Surgeon: Angelia Mould, MD;  Location: [facility];  Service: Vascular;  Laterality: Right;  . IR DIALY SHUNT INTRO [place] W/IMG RIGHT Right 12/08/2018  . IR DIALY SHUNT INTRO NEEDLE/INTRACATH INITIAL W/IMG RIGHT Right 08/18/2020  . LIGATION OF ARTERIOVENOUS  FISTULA Right 01/22/2016   Procedure: LIGATION OF RIGHT FOREARM ARTERIOVENOUS  FISTULA;  Surgeon: Angelia Mould, MD;  Location: [facility];  Service: Vascular;  Laterality: Right;  . PERIPHERAL VASCULAR CATHETERIZATION N/A 01/12/2016   Procedure: Fistulagram;  Surgeon: Angelia Mould, MD;  Location: [facility];  Service: Cardiovascular;  Laterality: N/A;  . UMBILICAL HERNIA REPAIR         Home Medications    Prior to Admission medications   Medication Sig Start Date End Date Taking? Authorizing Provider  acetaminophen (TYLENOL) 325 MG tablet Take 325-650 mg by mouth every 6 (six) hours as needed for mild pain or headache.    [provider]  amLODipine (NORVASC) 10 MG tablet TAKE 1 TABLET (10 MG TOTAL) BY MOUTH DAILY. 07/01/20   Sanjuan Dame, MD  atorvastatin (LIPITOR) 40 MG tablet TAKE 1 TABLET (40 MG TOTAL) BY  MOUTH DAILY. 08/01/20   Jeralyn Bennett, MD  carvedilol (COREG) 12.5 MG tablet TAKE 1 TABLET (12.5 MG TOTAL) BY MOUTH 2 (TWO) TIMES DAILY. 09/01/20   Katsadouros, Vasilios, MD  dextromethorphan-guaiFENesin (MUCINEX DM) 30-600 MG 12hr tablet Take 1 tablet by mouth 2 (two) times daily. 01/27/20   Wieters, Hallie C, PA-C  Dulaglutide (TRULICITY) 1.5 0000000 SOPN Inject 1.5 mg into the skin once  a week. Patient taking differently: Inject 1.5 mg into the skin every Monday. 05/28/19   [name], MD  fluticasone (FLONASE) 50 MCG/ACT nasal spray Place 1-2 sprays into both nostrils daily for 7 days. 01/27/20 02/03/20  Wieters, Hallie C, PA-C  glucose blood (CONTOUR NEXT TEST) test strip Test blood glucose 4 times daily 01/01/19   Welford Roche, MD  hydrALAZINE (APRESOLINE) 100 MG tablet TAKE 1 TABLET (100 MG TOTAL) BY MOUTH 3 (THREE) TIMES DAILY. 07/01/20   Sanjuan Dame, MD  insulin glargine (LANTUS) 100 UNIT/ML Solostar Pen Inject 35 Units into the skin 2 (two) times daily. 06/06/20   [name], MD  Insulin Pen Needle (UNIFINE PENTIPS) 32G X 4 MM MISC USE 2 NEEDLES TO INJECT INSULIN ONCE A DAY 06/06/20   [name], MD  Lancets MISC Test blood glucose 4 times daily 01/01/19   Welford Roche, MD  multivitamin (RENA-VIT) TABS tablet Take 1 tablet by mouth daily. 08/04/18   Santos-Sanchez, Merlene Morse, MD  Omega-3 Fatty Acids (FISH OIL) 1000 MG CAPS Take 1,000 mg by mouth in the morning and at bedtime.    [provider]  senna-docusate (SENOKOT S) 8.6-50 MG tablet Take 1 tablet by mouth daily. Patient taking differently: Take 1 tablet by mouth daily as needed for mild constipation. 05/28/19   [name], MD  triamcinolone cream (KENALOG) 0.1 % Apply 1 application topically 2 (two) times daily. 12/03/19   Welford Roche, MD    Family History Family History  Problem Relation Age of Onset  . Hypertension Mother   . Hyperlipidemia Mother   . Diabetes Mellitus II Sister     Social History Social History   Tobacco Use  . Smoking status: Former Smoker    Quit date: 10/22/2003    Years since quitting: 16.8  . Smokeless tobacco: Never Used  Vaping Use  . Vaping Use: Never used  Substance Use Topics  . Alcohol use: No    Alcohol/week: 0.0 standard drinks  . Drug use: No     Allergies   Other, Poractant alfa, Pork-derived products, and  Penicillins   Review of Systems Review of Systems  Constitutional: Negative for appetite change, chills and fever.  HENT: Negative for congestion, ear pain, rhinorrhea, sinus pressure, sinus pain and sore throat.   Eyes: Negative for redness and visual disturbance.  Respiratory: Positive for shortness of breath. Negative for cough, chest tightness and wheezing.   Cardiovascular: Negative for chest pain and palpitations.       Chest wall pain with deep inspiration   Gastrointestinal: Negative for abdominal pain, constipation, diarrhea, nausea and vomiting.  Genitourinary: Negative for dysuria, frequency and urgency.  Musculoskeletal: Negative for myalgias.  Neurological: Negative for dizziness, weakness and headaches.  Psychiatric/Behavioral: Negative for confusion.  All other systems reviewed and are negative.    Physical Exam Triage Vital Signs ED Triage Vitals  Enc Vitals Group     BP 09/08/20 1815 (!) 160/78     Pulse Rate 09/08/20 1815 66     Resp 09/08/20 1815 18  Temp 09/08/20 1815 98.3 F (36.8 C)     Temp Source 09/08/20 1815 Oral     SpO2 09/08/20 1815 97 %     Weight --      Height --      Head Circumference --      Peak Flow --      Pain Score 09/08/20 1818 10     Pain Loc --      Pain Edu? --      Excl. in [place]? --    No data found.  Updated Vital Signs BP (!) 160/78 (BP Location: Left Arm)   Pulse 66   Temp 98.3 F (36.8 C) (Oral)   Resp 18   SpO2 97%   Visual Acuity Right Eye Distance:   Left Eye Distance:   Bilateral Distance:    Right Eye Near:   Left Eye Near:    Bilateral Near:     Physical Exam Vitals reviewed.  Constitutional:      General: He is not in acute distress.    Appearance: Normal appearance. He is not ill-appearing.  HENT:     Head: Normocephalic and atraumatic.     Right Ear: Hearing, tympanic membrane, ear canal and external ear normal. No swelling or tenderness. There is no impacted cerumen. No mastoid tenderness.  Tympanic membrane is not perforated, erythematous, retracted or bulging.     Left Ear: Hearing, tympanic membrane, ear canal and external ear normal. No swelling or tenderness. There is no impacted cerumen. No mastoid tenderness. Tympanic membrane is not perforated, erythematous, retracted or bulging.     Nose:     Right Sinus: No maxillary sinus tenderness or frontal sinus tenderness.     Left Sinus: No maxillary sinus tenderness or frontal sinus tenderness.     Mouth/Throat:     Mouth: Mucous membranes are moist.     Pharynx: Uvula midline. No oropharyngeal exudate or posterior oropharyngeal erythema.     Tonsils: No tonsillar exudate.  Eyes:     Extraocular Movements: Extraocular movements intact.     Pupils: Pupils are equal, round, and reactive to light.  Cardiovascular:     Rate and Rhythm: Normal rate and regular rhythm.     Heart sounds: Normal heart sounds.  Pulmonary:     Breath sounds: Normal air entry. Wheezing present. No decreased breath sounds, rhonchi or rales.     Comments: Scattered wheezes throughout Chest:     Chest wall: No tenderness.     Comments: R sternal border TTP Abdominal:     General: Abdomen is flat. Bowel sounds are normal.     Tenderness: There is no abdominal tenderness. There is no guarding or rebound.  Lymphadenopathy:     Cervical: No cervical adenopathy.  Neurological:     General: No focal deficit present.     Mental Status: He is alert and oriented to person, place, and time.  Psychiatric:        Attention and Perception: Attention and perception normal.        Mood and Affect: Mood and affect normal.        Behavior: Behavior normal. Behavior is cooperative.        Thought Content: Thought content normal.        Judgment: Judgment normal.      UC Treatments / Results  Labs (all labs ordered are listed, but only abnormal results are displayed) Labs Reviewed - No data to display  EKG   Radiology DG  Chest 2 View  Result Date:  09/08/2020 CLINICAL DATA:  55 year old male with history of shortness of breath, upper back pain and weakness for the past 9 days. EXAM: CHEST - 2 VIEW COMPARISON:  Chest x-ray 01/27/2020. FINDINGS: Chronic linear scarring in the left lower lobe, stable compared to prior examinations. Lung volumes are normal. No consolidative airspace disease. No pleural effusions. No pneumothorax. No pulmonary nodule or mass noted. Pulmonary vasculature and the cardiomediastinal silhouette are within normal limits. IMPRESSION: No radiographic evidence of acute cardiopulmonary disease. Electronically Signed   By: Vinnie Langton M.D.   On: 09/08/2020 19:33    Procedures Procedures (including critical care time)  Medications Ordered in UC Medications  albuterol (VENTOLIN HFA) 108 (90 Base) MCG/ACT inhaler 2 puff (has no administration in time range)    Initial Impression / Assessment and Plan / UC Course  I have reviewed the triage vital signs and the nursing notes.  Pertinent labs & imaging results that were available during my care of the patient were reviewed by me and considered in my medical decision making (see chart for details).     This patient is a 54-year-male presenting with shortness of breath. History asthma.   EKG NSR. Compared to 2019 EKG, unchanged. Chest wall pain is reproducible.   Patient with history asthma currently poorly controlled on no medications. Today he is afebrile nontachycardic nontachypneic oxygenating well on room air with scattered wheezes throughout.  CXR - No radiographic evidence of acute cardiopulmonary disease.  For costochondritis- tylenol. Pt with ESRD so avoid NSAIDs.  Pt with type 2 diabetes poorly controlled, last A1c 9.2 03/31/2020. Will not send prednisone due to this.   Albuterol inhaler provided today for prn use.   Using translator, spent over 40 minutes obtaining H&P, performing physical, interpreting EKS, discussing results, treatment plan and plan  for follow-up with patient. Patient agrees with plan.   Final Clinical Impressions(s) / UC Diagnoses   Final diagnoses:  ESRD on dialysis ([place])  Type 2 diabetes mellitus with other specified complication, with long-term current use of insulin (HCC)  Mild intermittent asthma with acute exacerbation  Costochondritis     Discharge Instructions     -Albuterol inhaler as needed for shortness of breath -If no improvement in symptoms despite treatment, or if symptoms persist, follow-up with PCP to discuss long-term medications for asthma     ED Prescriptions    None     PDMP not reviewed this encounter.   Hazel Sams, PA-C 09/08/20 1946

## 2020-09-08 NOTE — Discharge Instructions (Addendum)
-  Albuterol inhaler as needed for shortness of breath -If no improvement in symptoms despite treatment, or if symptoms persist, follow-up with PCP to discuss long-term medications for asthma

## 2020-09-26 ENCOUNTER — Ambulatory Visit (INDEPENDENT_AMBULATORY_CARE_PROVIDER_SITE_OTHER): Payer: Self-pay | Admitting: Student

## 2020-09-26 ENCOUNTER — Encounter: Payer: Self-pay | Admitting: Student

## 2020-09-26 VITALS — BP 143/83 | HR 71 | Temp 98.1°F | Ht 65.0 in | Wt 215.5 lb

## 2020-09-26 DIAGNOSIS — I10 Essential (primary) hypertension: Secondary | ICD-10-CM

## 2020-09-26 DIAGNOSIS — N186 End stage renal disease: Secondary | ICD-10-CM

## 2020-09-26 DIAGNOSIS — IMO0002 Reserved for concepts with insufficient information to code with codable children: Secondary | ICD-10-CM

## 2020-09-26 DIAGNOSIS — E1165 Type 2 diabetes mellitus with hyperglycemia: Secondary | ICD-10-CM

## 2020-09-26 LAB — POCT GLYCOSYLATED HEMOGLOBIN (HGB A1C): Hemoglobin A1C: 7.5 % — AB (ref 4.0–5.6)

## 2020-09-26 LAB — GLUCOSE, CAPILLARY: Glucose-Capillary: 325 mg/dL — ABNORMAL HIGH (ref 70–99)

## 2020-09-26 NOTE — Assessment & Plan Note (Signed)
Patient's daughter with patient today. Reports compliance with medications. States when he gets his food from her and her siblings, his sugars are 150-200. However, other family members often bring in unhealthy foods which increase his sugars to 300+. Patient's daughter endorses concern over sugars getting below 120, as he begins to feel "off."   A/P: Discussed with patient and daughter that goal sugars should be 100-120 and encouraged continued improvement of diet. Will obtain A1c today and adjust medications as needed. - A1c today - Continue Trulicity weekly, Lantus 35u BID

## 2020-09-26 NOTE — Patient Instructions (Addendum)
Eric Glenn,  It was a pleasure seeing you today!  Today we discussed your diabetes, dialysis, blood pressure, and toenail.  We are waiting to see what your A1c is. I will call you tomorrow with the results and make any changes as needed.  Your nephrologist is Dr. Jimmy Footman. His number is [phone]. Please call and make an appointment soon to discuss blood pressure management and dialysis symptoms.  You will need to also see podiatry. We will wait until you have your orange card to do this.  We look forward to seeing you next time. Please call our clinic at [phone] if you have any questions or concerns. The best time to call is Monday-Friday from 9am-4pm, but there is someone available 24/7 at the same number. If you need medication refills, please notify your pharmacy one week in advance and they will send [name] a request.  Thank you for letting us take part in your care. Wishing you the best!  Thank you, Dr. Sanjuan Dame, MD    [name],  Me alegre de verle hoy!  Hoy hablamos sobre su diabetes, dilisis, presin arterial y uas de los pies.  Estamos esperando a ver cul es su A1c. Te llamar maana con los resultados y [employer] cambios necesarios.  Su nefrlogo es el Dr. Jimmy Footman. Su nmero es [phone]. Llame y programe una cita pronto para Research scientist (physical sciences) control de la presin arterial y los sntomas de la dilisis.  Necesitars ver tambin podologa. Esperaremos hasta que tengas tu tarjeta naranja para hacerlo.  Estamos deseando verle la proxima vez. [place] a [phone] si tiene preguntas o preocupaciones. El mejor tiempo para llamar es lunes a viernes, 9:00am - 4:00pm, pero hay alguien esta a su disposcion para lo que halga falta a cualquier hora. Si necesita recambio de los [employer] , llama su farmacia una semana antes de fecha de finalizacion de la receta. La farmacia nos llamara para la solicitud.  Gracias que nos permite  tomar parte en su asistencia medica. Deseamos lo mejor!  [name], Dr. Sanjuan Dame, MD

## 2020-09-26 NOTE — Progress Notes (Signed)
   CC: diabetes follow-up  HPI:  Eric Glenn is a 55 y.o. with ESRD on TTS HD, type II diabetes mellitus, hypertension, hyperlipidemia presenting to clinic for 6 month follow-up.  Past Medical History:  Diagnosis Date  . Anemia   . Diabetes ([place])   . Diabetes mellitus type 2, controlled ([place]) 08/04/2015  . Diabetic retinopathy ([place])   . Diabetic retinopathy ([place])   . Dialysis patient ([place])   . Diarrhea   . ESRD (end stage renal disease) ([place]) 03/10/2016  . High cholesterol   . Hypertension   . Nausea 08/2017   Review of Systems:  As per HPI  Physical Exam:  Vitals:   09/26/20 0951  BP: (!) 143/83  Pulse: 71  Temp: 98.1 F (36.7 C)  TempSrc: Oral  SpO2: 95%  Weight: 215 lb 8 oz (97.8 kg)  Height: '5\' 5"'$  (1.651 m)   General: Sitting comfortably in chair, no acute distress CV: Regular rate, rhythm. No m/r/g appreciated. Distal pulses 2+ bilaterally. Pulm: Clear to auscultation bilaterally. No wheezing, rales, rhonchi. MSK: No pitting edema bilaterally. Normal bulk, tone.  Assessment & Plan:   See Encounters Tab for problem based charting.  Patient discussed with Dr. Dareen Piano

## 2020-09-26 NOTE — Assessment & Plan Note (Signed)
Patient reporting chronic fatigue that occurs after dialysis. He is unsure of who is nephrologist is and when his last appointment was. Patient's daughter reports compliance in attending HD sessions, rarely missing one.   On exam, patient euvolemic. Discussed with patient and daughter that his nephrologist is Dr. Jimmy Footman, gave the office number, and encouraged them to make an appointment as soon as possible. Also discussed that fatigue is very common after dialysis and Dr. Jimmy Footman can discuss with them if any adjustments need to be made. Patient to also meet with financial counselor. - Make appointment with Dr. Ronny Flurry

## 2020-09-26 NOTE — Assessment & Plan Note (Signed)
BP Readings from Last 3 Encounters:  09/26/20 (!) 143/83  09/08/20 (!) 160/78  08/18/20 (!) 153/82   Patient to have HD tomorrow. Will avoid further titration of beta blocker given HR. Will defer further hypertension regimen changes to patient's nephrologist. - Continue amlodipine 10, coreg 12.5 BID, hydralazine 100 TID

## 2020-09-29 ENCOUNTER — Other Ambulatory Visit: Payer: Self-pay | Admitting: Student

## 2020-09-29 NOTE — Progress Notes (Signed)
Internal Medicine Clinic Attending ? ?Case discussed with Dr. Braswell  At the time of the visit.  We reviewed the resident?s history and exam and pertinent patient test results.  I agree with the assessment, diagnosis, and plan of care documented in the resident?s note.  ?

## 2020-10-08 ENCOUNTER — Ambulatory Visit: Payer: Self-pay

## 2020-10-13 MED FILL — CARVEDILOL 12.5 MG TABLET: 12.5 | 30 days supply | Qty: 60 | Fill #1

## 2020-10-13 MED FILL — AMLODIPINE BESYLATE 10 MG T: 10 | 30 days supply | Qty: 30 | Fill #3

## 2020-10-18 ENCOUNTER — Other Ambulatory Visit (HOSPITAL_COMMUNITY): Payer: Self-pay

## 2020-11-07 ENCOUNTER — Other Ambulatory Visit (HOSPITAL_COMMUNITY): Payer: Self-pay

## 2020-11-07 MED FILL — Insulin Glargine Soln Pen-Injector 100 Unit/ML: SUBCUTANEOUS | 28 days supply | Qty: 21 | Fill #0 | Status: AC

## 2020-11-11 ENCOUNTER — Other Ambulatory Visit: Payer: Self-pay

## 2020-11-11 ENCOUNTER — Ambulatory Visit
Admission: EM | Admit: 2020-11-11 | Discharge: 2020-11-11 | Disposition: A | Discharge status: Home / Self Care | Payer: Self-pay | Attending: Emergency Medicine | Admitting: Emergency Medicine

## 2020-11-11 DIAGNOSIS — R519 Headache, unspecified: Secondary | ICD-10-CM

## 2020-11-11 MED ORDER — DEXAMETHASONE SODIUM PHOSPHATE 10 MG/ML IJ SOLN
10.0000 mg | Freq: Once | INTRAMUSCULAR | Status: AC
  Administered 2020-11-11: 10 mg via INTRAMUSCULAR

## 2020-11-11 MED ORDER — METHOCARBAMOL 500 MG PO TABS: 500.0000 mg | ORAL_TABLET | Freq: Two times a day (BID) | ORAL | 0 refills | Status: DC

## 2020-11-11 NOTE — Discharge Instructions (Addendum)
We gave 1 dose of Decadron today for headache Continue with Tylenol [phone] mg every 4-6 hours as needed May use Robaxin sparingly, may cause drowsiness, limit use to at home/bedtime  Follow-up if not improving or worsening

## 2020-11-11 NOTE — ED Provider Notes (Signed)
[facility]    CSN: [id number] Arrival date & time: 11/11/20  1425      History   Chief Complaint Chief Complaint  Patient presents with  . Headache    HPI Eric Glenn is a 55 y.o. male history of ESRD on dialysis, DM type II, presenting today for evaluation of headache.  Has had headache to right side of head x3 weeks.  Located behind the eye.  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  Has seen ophthalmology within the past week and reports dilated eye exam that was normal.  He denies associated URI symptoms of congestion, sinus pressure, drainage sore throat or cough.  Denies fevers.  Denies nausea or vomiting.  Denies change in oral intake.  Denies any weakness, numbness or tingling.  Patient otherwise at baseline.  HPI  Past Medical History:  Diagnosis Date  . Anemia   . Diabetes ([place])   . Diabetes mellitus type 2, controlled ([place]) 08/04/2015  . Diabetic retinopathy ([place])   . Diabetic retinopathy ([place])   . Dialysis patient ([place])   . Diarrhea   . ESRD (end stage renal disease) ([place]) 03/10/2016  . High cholesterol   . Hypertension   . Nausea 08/2017    Patient Active Problem List   Diagnosis Date Noted  . Fatigue 03/31/2020  . Dermatitis of lower extremity 12/03/2019  . Ulcer of right foot, limited to breakdown of skin ([place]) 11/28/2019  . 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 Bilateral leg cramps 08/27/2019  . Chronic idiopathic constipation 05/29/2019  . ESRD (end stage renal disease) ([place]) 03/10/2016  . Nasal septal deviation 12/10/2015  . Asthma 10/01/2015  . Hypersomnia 10/01/2015  . Obesity 10/01/2015  . Normocytic anemia 08/28/2015  . Hyperlipidemia 08/19/2015  . Sinusitis, chronic 08/19/2015  . Essential hypertension 08/04/2015  . Uncontrolled type 2 diabetes mellitus with complication ([place]) [id number]  . Diabetic retinopathy  associated with type 2 diabetes mellitus ([place]) 08/04/2015    Past Surgical History:  Procedure Laterality Date  . AV FISTULA PLACEMENT Right 09/04/2015   Procedure: ARTERIOVENOUS (AV) FISTULA CREATION;  Surgeon: Angelia Mould, MD;  Location: [place];  Service: Vascular;  Laterality: Right;  . AV FISTULA PLACEMENT Right 01/22/2016   Procedure: RIGHT UPPER ARM BRACHIOCEPHALIC ARTERIOVENOUS (AV) FISTULA CREATION;  Surgeon: Angelia Mould, MD;  Location: [place];  Service: Vascular;  Laterality: Right;  . IR DIALY SHUNT INTRO [place] W/IMG RIGHT Right 12/08/2018  . IR DIALY SHUNT INTRO NEEDLE/INTRACATH INITIAL W/IMG RIGHT Right 08/18/2020  . LIGATION OF ARTERIOVENOUS  FISTULA Right 01/22/2016   Procedure: LIGATION OF RIGHT FOREARM ARTERIOVENOUS  FISTULA;  Surgeon: Angelia Mould, MD;  Location: [place];  Service: Vascular;  Laterality: Right;  . PERIPHERAL VASCULAR CATHETERIZATION N/A 01/12/2016   Procedure: Fistulagram;  Surgeon: Angelia Mould, MD;  Location: [facility];  Service: Cardiovascular;  Laterality: N/A;  . UMBILICAL HERNIA REPAIR         Home Medications    Prior to Admission medications   Medication Sig Start Date End Date Taking? Authorizing Provider  methocarbamol (ROBAXIN) 500 MG tablet Take 1 tablet (500 mg total) by mouth 2 (two) times daily. 11/11/20  Yes Love Chowning C, PA-C  acetaminophen (TYLENOL) 325 MG tablet Take 325-650 mg by mouth every 6 (six) hours as needed for mild pain or headache.    [provider]  amLODipine (NORVASC) 10 MG tablet TAKE 1 TABLET (10 MG TOTAL) BY MOUTH DAILY. 07/01/20 07/01/21  Sanjuan Dame, MD  atorvastatin (LIPITOR) 40 MG tablet TAKE 1 TABLET (40 MG TOTAL) BY MOUTH DAILY. 08/01/20 08/01/21  Jeralyn Bennett, MD  carvedilol (COREG) 12.5 MG tablet TAKE 1 TABLET (12.5 MG TOTAL) BY MOUTH 2 (TWO) TIMES DAILY. 09/01/20 09/01/21  Katsadouros, Vasilios, MD  Dulaglutide (TRULICITY) 1.5 0000000  SOPN Inject 1.5 mg into the skin once a week. Patient taking differently: Inject 1.5 mg into the skin every Monday. 05/28/19   [name], MD  fluticasone (FLONASE) 50 MCG/ACT nasal spray Place 1-2 sprays into both nostrils daily for 7 days. 01/27/20 02/03/20  Jenya Putz C, PA-C  glucose blood (CONTOUR NEXT TEST) test strip Test blood glucose 4 times daily 01/01/19   Welford Roche, MD  hydrALAZINE (APRESOLINE) 100 MG tablet TAKE 1 TABLET (100 MG TOTAL) BY MOUTH 3 (THREE) TIMES DAILY. 07/01/20 07/01/21  Sanjuan Dame, MD  insulin glargine (LANTUS) 100 UNIT/ML Solostar Pen INJECT 35 UNITS INTO THE SKIN 2 TIMES DAILY. 06/06/20 06/06/21  [name], MD  Insulin Pen Needle 32G X 4 MM MISC USE AS DIRECTED TWICE DAILY. 06/06/20 06/06/21  [name], MD  Lancets MISC Test blood glucose 4 times daily 01/01/19   Welford Roche, MD  multivitamin (RENA-VIT) TABS tablet Take 1 tablet by mouth daily. 08/04/18   Santos-Sanchez, Merlene Morse, MD  Omega-3 Fatty Acids (FISH OIL) 1000 MG CAPS Take 1,000 mg by mouth in the morning and at bedtime.    [provider]  senna-docusate (SENOKOT S) 8.6-50 MG tablet Take 1 tablet by mouth daily. Patient taking differently: Take 1 tablet by mouth daily as needed for mild constipation. 05/28/19   [name], MD  triamcinolone cream (KENALOG) 0.1 % Apply 1 application topically 2 (two) times daily. 12/03/19   Welford Roche, MD    Family History Family History  Problem Relation Age of Onset  . Hypertension Mother   . Hyperlipidemia Mother   . Diabetes Mellitus II Sister     Social History Social History   Tobacco Use  . Smoking status: Former Smoker    Quit date: 10/22/2003    Years since quitting: 17.0  . Smokeless tobacco: Never Used  Vaping Use  . Vaping Use: Never used  Substance Use Topics  . Alcohol use: No    Alcohol/week: 0.0 standard drinks  . Drug use: No     Allergies   Other, Poractant alfa, Pork-derived  products, and Penicillins   Review of Systems Review of Systems  Constitutional: Negative for fatigue and fever.  HENT: Negative for congestion, sinus pressure and sore throat.   Eyes: Negative for photophobia, pain and visual disturbance.  Respiratory: Negative for cough and shortness of breath.   Cardiovascular: Negative for chest pain.  Gastrointestinal: Negative for abdominal pain, nausea and vomiting.  Genitourinary: Negative for decreased urine volume and hematuria.  Musculoskeletal: Negative for myalgias, neck pain and neck stiffness.  Neurological: Positive for headaches. Negative for dizziness, syncope, facial asymmetry, speech difficulty, weakness, light-headedness and numbness.     Physical Exam Triage Vital Signs ED Triage Vitals  Enc Vitals Group     BP 11/11/20 1620 137/75     Pulse Rate 11/11/20 1620 (!) 58     Resp 11/11/20 1620 18     Temp 11/11/20 1620 98.1 F (36.7 C)     Temp Source 11/11/20 1620 Oral     SpO2  11/11/20 1620 96 %     Weight --      Height --      Head Circumference --      Peak Flow --      Pain Score 11/11/20 1623 9     Pain Loc --      Pain Edu? --      Excl. in [place]? --    No data found.  Updated Vital Signs BP 137/75 (BP Location: Left Arm)   Pulse (!) 58   Temp 98.1 F (36.7 C) (Oral)   Resp 18   SpO2 96%   Visual Acuity Right Eye Distance:   Left Eye Distance:   Bilateral Distance:    Right Eye Near:   Left Eye Near:    Bilateral Near:     Physical Exam Vitals and nursing note reviewed.  Constitutional:      Appearance: He is well-developed.     Comments: No acute distress  HENT:     Head: Normocephalic and atraumatic.     Ears:     Comments: Bilateral ears without tenderness to palpation of external auricle, tragus and mastoid, EAC's without erythema or swelling, TM's with good bony landmarks and cone of light. Non erythematous.     Nose: Nose normal.     Mouth/Throat:     Comments: Oral mucosa pink and  moist, no tonsillar enlargement or exudate. Posterior pharynx patent and nonerythematous, no uvula deviation or swelling. Normal phonation. Eyes:     Conjunctiva/sclera: Conjunctivae normal.     Comments: Pterygium bilaterally  Cardiovascular:     Rate and Rhythm: Normal rate and regular rhythm.  Pulmonary:     Effort: Pulmonary effort is normal. No respiratory distress.     Comments: Breathing comfortably at rest, CTABL, no wheezing, rales or other adventitious sounds auscultated Abdominal:     General: There is no distension.  Musculoskeletal:        General: Normal range of motion.     Cervical back: Neck supple.  Skin:    General: Skin is warm and dry.  Neurological:     General: No focal deficit present.     Mental Status: He is alert and oriented to person, place, and time. Mental status is at baseline.     Cranial Nerves: No cranial nerve deficit.     Motor: No weakness.     Gait: Gait normal.      UC Treatments / Results  Labs (all labs ordered are listed, but only abnormal results are displayed) Labs Reviewed - No data to display  EKG   Radiology No results found.  Procedures Procedures (including critical care time)  Medications Ordered in UC Medications  dexamethasone (DECADRON) injection 10 mg (10 mg Intramuscular Given 11/11/20 1710)    Initial Impression / Assessment and Plan / UC Course  I have reviewed the triage vital signs and the nursing notes.  Pertinent labs & imaging results that were available during my care of the patient were reviewed by me and considered in my medical decision making (see chart for details).     55 year old male with headache-no neurodeficits on exam, ESRD on dialysis, will avoid NSAIDs.  Providing 1 dose of Decadron prior to discharge and will continue with Tylenol.  Trial of Robaxin to use sparingly, discussed possible drowsiness with this medicine.  Discussed strict return precautions. Patient verbalized understanding  and is agreeable with plan.  Final Clinical Impressions(s) / UC Diagnoses   Final diagnoses:  Acute nonintractable headache, unspecified headache type     Discharge Instructions     We gave 1 dose of Decadron today for headache Continue with Tylenol [phone] mg every 4-6 hours as needed May use Robaxin sparingly, may cause drowsiness, limit use to at home/bedtime  Follow-up if not improving or worsening    ED Prescriptions    Medication Sig Dispense Auth. Provider   methocarbamol (ROBAXIN) 500 MG tablet Take 1 tablet (500 mg total) by mouth 2 (two) times daily. 10 tablet Dierre Crevier, [place] C, PA-C     PDMP not reviewed this encounter.   Janith Lima, [place] 11/11/20 1851

## 2020-11-11 NOTE — ED Triage Notes (Signed)
Per pts niece pt c/o pain and pressure behind rt eye causing headache to rt side of head and unable to sleep x3 wks. States pt is completely blind in rt eye and sees shadows in lt eye.

## 2020-11-17 ENCOUNTER — Other Ambulatory Visit: Payer: Self-pay | Admitting: Student

## 2020-11-17 ENCOUNTER — Other Ambulatory Visit (HOSPITAL_COMMUNITY): Payer: Self-pay

## 2020-11-17 DIAGNOSIS — I10 Essential (primary) hypertension: Secondary | ICD-10-CM

## 2020-11-17 MED FILL — Carvedilol Tab 12.5 MG: ORAL | 30 days supply | Qty: 60 | Fill #0 | Status: AC

## 2020-11-17 MED FILL — Hydralazine HCl Tab 100 MG: ORAL | 30 days supply | Qty: 90 | Fill #0 | Status: AC

## 2020-11-18 ENCOUNTER — Other Ambulatory Visit (HOSPITAL_COMMUNITY): Payer: Self-pay

## 2020-11-18 MED ORDER — AMLODIPINE BESYLATE 10 MG PO TABS
10.0000 mg | ORAL_TABLET | Freq: Every day | ORAL | 3 refills | Status: DC
  Filled 2020-11-18 – 2020-12-17 (×2): qty 30, 30d supply, fill #0
  Filled 2021-01-16: qty 30, 30d supply, fill #1
  Filled 2021-02-18: qty 30, 30d supply, fill #2
  Filled 2021-03-20: qty 30, 30d supply, fill #3

## 2020-11-26 ENCOUNTER — Other Ambulatory Visit (HOSPITAL_COMMUNITY): Payer: Self-pay

## 2020-12-10 ENCOUNTER — Telehealth: Payer: Self-pay | Admitting: Student

## 2020-12-10 NOTE — Telephone Encounter (Signed)
Return call to pt's niece, Magda Paganini, to discuss pt's toe. No answer, vm has not been set-up, unable to leave a message. [place] office has scheduled an appt on  Friday with Dr Court Joy.

## 2020-12-10 NOTE — Telephone Encounter (Signed)
Pt's Niece Magda Paganini called and stated Pt's was instructed by his Kidney Dr. To call and make an appt as soon as possible in reference to his Right toe begn swollen, hurting off and on and red.  Patient was offered an appt for Friday Morning.  Pt is unable to come.  Please call back.

## 2020-12-11 ENCOUNTER — Encounter: Payer: Self-pay | Admitting: *Deleted

## 2020-12-12 ENCOUNTER — Ambulatory Visit: Payer: Self-pay | Admitting: Internal Medicine

## 2020-12-12 ENCOUNTER — Other Ambulatory Visit: Payer: Self-pay

## 2020-12-12 ENCOUNTER — Encounter: Payer: Self-pay | Admitting: Internal Medicine

## 2020-12-12 VITALS — BP 176/87 | HR 66 | Temp 98.3°F | Ht 65.0 in | Wt 213.9 lb

## 2020-12-12 DIAGNOSIS — B351 Tinea unguium: Secondary | ICD-10-CM

## 2020-12-12 NOTE — Progress Notes (Signed)
   CC: onychomycosis   HPI:[name] is a 54 y.o. male who presents for evaluation of onychomycosis. Please see individual problem based A/P for details.  Past Medical History:  Diagnosis Date  . Anemia   . Diabetes ([place])   . Diabetes mellitus type 2, controlled ([place]) 08/04/2015  . Diabetic retinopathy ([place])   . Diabetic retinopathy ([place])   . Dialysis patient ([place])   . Diarrhea   . ESRD (end stage renal disease) ([place]) 03/10/2016  . High cholesterol   . Hypertension   . Nausea 08/2017   Review of Systems:   Review of Systems  Constitutional: Negative for chills and fever.  Musculoskeletal: Negative for falls and joint pain.     Physical Exam: Vitals:   12/12/20 1030 12/12/20 1037  BP: (!) 172/75 (!) 176/87  Pulse: 66 66  Temp: 98.3 F (36.8 C)   TempSrc: Oral   SpO2: 99%   Weight: 213 lb 14.4 oz (97 kg)   Height: '5\' 5"'$  (1.651 m)     General: spanish speaking male , abnormal vision Cardiovascular: Normal rate, regular rhythm.  No murmurs, rubs, or gallops Pulmonary : Equal breath sounds, No wheezes, rales, or rhonchi Ext: ochmycosis of great right , mild edema in left foot and ankle, normal capillary refill , nl sensation   Assessment & Plan:   See Encounters Tab for problem based charting.  Patient discussed with Dr. Evette Doffing

## 2020-12-12 NOTE — Patient Instructions (Signed)
Thank you for trusting me with your care. To recap, today we discussed the following:   1. Onychomycosis  - Ambulatory referral to Podiatry

## 2020-12-14 ENCOUNTER — Encounter: Payer: Self-pay | Admitting: Internal Medicine

## 2020-12-14 DIAGNOSIS — B351 Tinea unguium: Secondary | ICD-10-CM | POA: Insufficient documentation

## 2020-12-14 NOTE — Assessment & Plan Note (Addendum)
     Patient is here for evaluation of edema in right great toe. Trace edema in left foot and ankle. He does not have any open wounds. No tenderness and area is not hot or erythematous. Patient denies any trauma to area and has nl sensation with diabetic foot testing. He reports this concern came up at dialysis, but he has increased edema in left leg for at least 2 years. I expressed this is appropriate concern given he has diabetes, but this does not appear to be an acute infections. He has onychomycosis of great toe, will refer to podiatry for further management.

## 2020-12-16 NOTE — Progress Notes (Signed)
Internal Medicine Clinic Attending  Case discussed with Dr. Steen  At the time of the visit.  We reviewed the resident's history and exam and pertinent patient test results.  I agree with the assessment, diagnosis, and plan of care documented in the resident's note.  

## 2020-12-17 ENCOUNTER — Other Ambulatory Visit (HOSPITAL_COMMUNITY): Payer: Self-pay

## 2020-12-17 ENCOUNTER — Encounter (HOSPITAL_COMMUNITY): Payer: Self-pay | Admitting: Nephrology

## 2020-12-17 MED FILL — Insulin Glargine Soln Pen-Injector 100 Unit/ML: SUBCUTANEOUS | 28 days supply | Qty: 21 | Fill #1 | Status: AC

## 2020-12-17 MED FILL — Hydralazine HCl Tab 100 MG: ORAL | 30 days supply | Qty: 90 | Fill #1 | Status: AC

## 2020-12-17 MED FILL — Carvedilol Tab 12.5 MG: ORAL | 30 days supply | Qty: 60 | Fill #1 | Status: AC

## 2021-01-12 ENCOUNTER — Ambulatory Visit
Admission: RE | Admit: 2021-01-12 | Discharge: 2021-01-12 | Disposition: A | Discharge status: Home / Self Care | Payer: Self-pay | Source: Ambulatory Visit | Attending: Emergency Medicine | Admitting: Emergency Medicine

## 2021-01-12 ENCOUNTER — Other Ambulatory Visit: Payer: Self-pay

## 2021-01-12 VITALS — BP 145/85 | HR 62 | Temp 98.4°F | Resp 18

## 2021-01-12 DIAGNOSIS — Z91014 Allergy to mammalian meats: Secondary | ICD-10-CM

## 2021-01-12 DIAGNOSIS — H608X1 Other otitis externa, right ear: Principal | ICD-10-CM | POA: Diagnosis present

## 2021-01-12 DIAGNOSIS — H9191 Unspecified hearing loss, right ear: Secondary | ICD-10-CM | POA: Diagnosis present

## 2021-01-12 DIAGNOSIS — Z20822 Contact with and (suspected) exposure to covid-19: Secondary | ICD-10-CM | POA: Diagnosis present

## 2021-01-12 DIAGNOSIS — N186 End stage renal disease: Secondary | ICD-10-CM | POA: Diagnosis present

## 2021-01-12 DIAGNOSIS — H543 Unqualified visual loss, both eyes: Secondary | ICD-10-CM | POA: Diagnosis present

## 2021-01-12 DIAGNOSIS — E1122 Type 2 diabetes mellitus with diabetic chronic kidney disease: Secondary | ICD-10-CM | POA: Diagnosis present

## 2021-01-12 DIAGNOSIS — Z9109 Other allergy status, other than to drugs and biological substances: Secondary | ICD-10-CM

## 2021-01-12 DIAGNOSIS — Z6835 Body mass index (BMI) 35.0-35.9, adult: Secondary | ICD-10-CM

## 2021-01-12 DIAGNOSIS — E7800 Pure hypercholesterolemia, unspecified: Secondary | ICD-10-CM | POA: Diagnosis present

## 2021-01-12 DIAGNOSIS — D631 Anemia in chronic kidney disease: Secondary | ICD-10-CM | POA: Diagnosis present

## 2021-01-12 DIAGNOSIS — I120 Hypertensive chronic kidney disease with stage 5 chronic kidney disease or end stage renal disease: Secondary | ICD-10-CM | POA: Diagnosis present

## 2021-01-12 DIAGNOSIS — Z880 Allergy status to penicillin: Secondary | ICD-10-CM

## 2021-01-12 DIAGNOSIS — H93A1 Pulsatile tinnitus, right ear: Secondary | ICD-10-CM | POA: Diagnosis present

## 2021-01-12 DIAGNOSIS — Z79899 Other long term (current) drug therapy: Secondary | ICD-10-CM

## 2021-01-12 DIAGNOSIS — E11319 Type 2 diabetes mellitus with unspecified diabetic retinopathy without macular edema: Secondary | ICD-10-CM | POA: Diagnosis present

## 2021-01-12 DIAGNOSIS — N2581 Secondary hyperparathyroidism of renal origin: Secondary | ICD-10-CM | POA: Diagnosis present

## 2021-01-12 DIAGNOSIS — Z833 Family history of diabetes mellitus: Secondary | ICD-10-CM

## 2021-01-12 DIAGNOSIS — Z794 Long term (current) use of insulin: Secondary | ICD-10-CM

## 2021-01-12 DIAGNOSIS — Z87891 Personal history of nicotine dependence: Secondary | ICD-10-CM

## 2021-01-12 DIAGNOSIS — E669 Obesity, unspecified: Secondary | ICD-10-CM | POA: Diagnosis present

## 2021-01-12 DIAGNOSIS — Z8249 Family history of ischemic heart disease and other diseases of the circulatory system: Secondary | ICD-10-CM

## 2021-01-12 DIAGNOSIS — H60391 Other infective otitis externa, right ear: Secondary | ICD-10-CM

## 2021-01-12 DIAGNOSIS — Z83438 Family history of other disorder of lipoprotein metabolism and other lipidemia: Secondary | ICD-10-CM

## 2021-01-12 DIAGNOSIS — Z992 Dependence on renal dialysis: Secondary | ICD-10-CM

## 2021-01-12 DIAGNOSIS — R519 Headache, unspecified: Secondary | ICD-10-CM | POA: Diagnosis present

## 2021-01-12 MED ORDER — LEVOFLOXACIN 250 MG PO TABS: ORAL_TABLET | ORAL | 0 refills | Status: DC

## 2021-01-12 MED ORDER — CIPRO HC 0.2-1 % OT SUSP: 3.0000 [drp] | Freq: Two times a day (BID) | OTIC | 0 refills | Status: DC

## 2021-01-12 NOTE — ED Provider Notes (Signed)
HPI  SUBJECTIVE:  Eric Glenn is a 55 y.o. male who presents with right ear pain for the past 3 days.  He describes it as throbbing, located primarily in the outside and along the external ear canal.  He reports decreased hearing, and a headache in his temple and forehead in the V1 distribution.  He reports needlelike pain in this distribution.  He reports right eye pain.  He is blind.  No fevers, facial rash, trauma, insect bite, foreign body insertion, otorrhea, facial weakness.  He has never had symptoms like this before.  He has tried Robaxin and Tylenol without improvement in symptoms.  Symptoms are worse with palpation.  He took Tylenol within 6 hours of evaluation.  He has a past medical history of diabetes, end-stage renal disease on dialysis, hypertension, varicella, anemia and is blind in both eyes.  Daughter does not know if he has had the shingles vaccine.  PMD: [facility]  All history obtained through daughter acting as Optometrist.  They declined third-party translation.  Past Medical History:  Diagnosis Date   Anemia    Diabetes ([place])    Diabetes mellitus type 2, controlled ([place]) 08/04/2015   Diabetic retinopathy ([place])    Diabetic retinopathy ([place])    Dialysis patient ([place])    Diarrhea    ESRD (end stage renal disease) ([place]) 03/10/2016   High cholesterol    Hypertension    Nausea 08/2017    Past Surgical History:  Procedure Laterality Date   AV FISTULA PLACEMENT Right 09/04/2015   Procedure: ARTERIOVENOUS (AV) FISTULA CREATION;  Surgeon: Angelia Mould, MD;  Location: [facility];  Service: Vascular;  Laterality: Right;   AV FISTULA PLACEMENT Right 01/22/2016   Procedure: RIGHT UPPER ARM BRACHIOCEPHALIC ARTERIOVENOUS (AV) FISTULA CREATION;  Surgeon: Angelia Mould, MD;  Location: [place];  Service: Vascular;  Laterality: Right;   IR DIALY SHUNT INTRO [place] W/IMG RIGHT Right 12/08/2018   IR DIALY SHUNT INTRO [place] W/IMG RIGHT Right 08/18/2020   LIGATION OF ARTERIOVENOUS  FISTULA Right 01/22/2016   Procedure: LIGATION OF RIGHT FOREARM ARTERIOVENOUS  FISTULA;  Surgeon: Angelia Mould, MD;  Location: [place];  Service: Vascular;  Laterality: Right;   PERIPHERAL VASCULAR CATHETERIZATION N/A 01/12/2016   Procedure: Fistulagram;  Surgeon: Angelia Mould, MD;  Location: [facility];  Service: Cardiovascular;  Laterality: N/A;   UMBILICAL HERNIA REPAIR      Family History  Problem Relation Age of Onset   Hypertension Mother    Hyperlipidemia Mother    Diabetes Mellitus II Sister     Social History   Tobacco Use   Smoking status: Former    Pack years: 0.00    Types: Cigarettes    Quit date: 10/22/2003    Years since quitting: 17.2   Smokeless tobacco: Never  Vaping Use   Vaping Use: Never used  Substance Use Topics   Alcohol use: No    Alcohol/week: 0.0 standard drinks   Drug use: No    No current facility-administered medications for this encounter.  Current Outpatient Medications:    ciprofloxacin-hydrocortisone (CIPRO HC) OTIC suspension, Place 3 drops into the right ear 2 (two) times daily for 7 days., Disp: 5 mL, Rfl: 0   levofloxacin (LEVAQUIN) 250 MG tablet, Take 2 tablets (500 mg) on day #1, then half a tablet every 24 hours for 7 days total, Disp: 5 tablet, Rfl: 0   acetaminophen (TYLENOL) 325 MG tablet, Take 325-650 mg  by mouth every 6 (six) hours as needed for mild pain or headache., Disp: , Rfl:    amLODipine (NORVASC) 10 MG tablet, Take 1 tablet (10 mg total) by mouth daily., Disp: 30 tablet, Rfl: 3   atorvastatin (LIPITOR) 40 MG tablet, TAKE 1 TABLET (40 MG TOTAL) BY MOUTH DAILY., Disp: 30 tablet, Rfl: 3   carvedilol (COREG) 12.5 MG tablet, TAKE 1 TABLET (12.5 MG TOTAL) BY MOUTH 2 (TWO) TIMES DAILY., Disp: 60 tablet, Rfl: 3   Dulaglutide (TRULICITY) 1.5 0000000 SOPN, Inject 1.5 mg into the skin once a week. (Patient taking differently: Inject 1.5 mg into the  skin every Monday.), Disp: 2 mL, Rfl: 3   fluticasone (FLONASE) 50 MCG/ACT nasal spray, Place 1-2 sprays into both nostrils daily for 7 days., Disp: 1 g, Rfl: 0   glucose blood (CONTOUR NEXT TEST) test strip, Test blood glucose 4 times daily, Disp: 100 each, Rfl: 11   hydrALAZINE (APRESOLINE) 100 MG tablet, TAKE 1 TABLET (100 MG TOTAL) BY MOUTH 3 (THREE) TIMES DAILY., Disp: 90 tablet, Rfl: 10   insulin glargine (LANTUS) 100 UNIT/ML Solostar Pen, INJECT 35 UNITS INTO THE SKIN 2 TIMES DAILY., Disp: 21 mL, Rfl: 6   Insulin Pen Needle 32G X 4 MM MISC, USE AS DIRECTED TWICE DAILY., Disp: 100 each, Rfl: 12   Lancets MISC, Test blood glucose 4 times daily, Disp: 100 each, Rfl: 11   methocarbamol (ROBAXIN) 500 MG tablet, Take 1 tablet (500 mg total) by mouth 2 (two) times daily., Disp: 10 tablet, Rfl: 0   multivitamin (RENA-VIT) TABS tablet, Take 1 tablet by mouth daily., Disp: 60 tablet, Rfl: 5   Omega-3 Fatty Acids (FISH OIL) 1000 MG CAPS, Take 1,000 mg by mouth in the morning and at bedtime., Disp: , Rfl:    senna-docusate (SENOKOT S) 8.6-50 MG tablet, Take 1 tablet by mouth daily. (Patient taking differently: Take 1 tablet by mouth daily as needed for mild constipation.), Disp: 30 tablet, Rfl: 0   triamcinolone cream (KENALOG) 0.1 %, Apply 1 application topically 2 (two) times daily., Disp: 15 g, Rfl: 2  Allergies  Allergen Reactions   Other Other (See Comments) and Rash    Raises blood pressure   Poractant Alfa Rash and Other (See Comments)    Raises blood pressure   Pork-Derived Products Rash and Other (See Comments)    Raises blood pressure   Penicillins Hives     ROS  As noted in HPI.   Physical Exam  BP (!) 145/85 (BP Location: Left Arm)   Pulse 62   Temp 98.4 F (36.9 C) (Oral)   Resp 18   SpO2 96%   Constitutional: Well developed, well nourished, no acute distress Eyes:  EOMI, conjunctiva normal bilaterally.  No dendrites seen right eye. HENT: Normocephalic,  atraumatic,mucus membranes moist. right external ear swollen, no erythema.  Pain with traction on pinna, palpation of tragus.  Positive tender swelling anterior to the tragus.  No tenderness, swelling over the mastoid.  Right EAC swollen with whitish debris.  TM normal.  No tenderness in the V1 distribution.  Decreased hearing in the right ear.  Left TM normal. Neck: No cervical lymphadenopathy Respiratory: Normal inspiratory effort Cardiovascular: Normal rate GI: nondistended skin: No facial, scalp rash, skin intact Musculoskeletal: no deformities Neurologic: Alert & oriented x 3, no focal neuro deficits Psychiatric: Speech and behavior appropriate   ED Course   Medications - No data to display  No orders of the defined types were placed  in this encounter.   No results found for this or any previous visit (from the past 24 hour(s)). No results found.  ED Clinical Impression  1. Infective otitis externa of right ear      ED Assessment/Plan  In the differential is an otitis externa, early shingles in the V1 distribution, do not think that this is auricular perichondritis.  Malignant otitis externa in the differential especially with his history, however, there is no otorrhea, he has no cranial nerve palsies/facial weakness, there is no exposed bone, ulceration of the EAC.  We will try treating as a otitis externa with Cipro HC, and renally dosed Levaquin 500 mg for first dose, 125 mg every 24 hours for the next 7 days per up-to-date guidelines.  He has hives with penicillins.  Will not start Valtrex in the absence of known diagnosis of shingles.  Follow-up with PMD in 2 days.  Strict ER return precautions given.  Discussed MDM, treatment plan, and plan for follow-up with family member and patient. Discussed sn/sx that should prompt return to the ED. they agree with plan.  Meds ordered this encounter  Medications   ciprofloxacin-hydrocortisone (CIPRO HC) OTIC suspension    Sig:  Place 3 drops into the right ear 2 (two) times daily for 7 days.    Dispense:  5 mL    Refill:  0   levofloxacin (LEVAQUIN) 250 MG tablet    Sig: Take 2 tablets (500 mg) on day #1, then half a tablet every 24 hours for 7 days total    Dispense:  5 tablet    Refill:  0      *This clinic note was created using [employer]. Therefore, there may be occasional mistakes despite careful proofreading.  ?    Melynda Ripple, MD 01/13/21 [id number]

## 2021-01-12 NOTE — Discharge Instructions (Addendum)
I believe he has an otitis externa.  This could early shingles, but you cannot make the diagnosis until the rash shows up.  He is at high risk for malignant otitis externa which is an emergency.  Take him immediately to the hospital for fevers above 100.4, discharge from the ear, pain not controlled with Tylenol, or for any other concerns.  Take the eardrop as written, and finish the Levaquin, unless a provider tells you to stop.  Follow-up with your doctor in 1 to 2 days.

## 2021-01-12 NOTE — ED Triage Notes (Signed)
Pt c/o rt  ear pain with swelling x3 days. States taken tylenol with no relief.

## 2021-01-13 ENCOUNTER — Inpatient Hospital Stay (HOSPITAL_COMMUNITY)
Admission: EM | Admit: 2021-01-13 | Discharge: 2021-01-14 | DRG: 154 | Disposition: A | Discharge status: Home / Self Care | Payer: Self-pay | Attending: Internal Medicine | Admitting: Internal Medicine

## 2021-01-13 ENCOUNTER — Encounter (HOSPITAL_COMMUNITY): Payer: Self-pay | Admitting: Emergency Medicine

## 2021-01-13 ENCOUNTER — Emergency Department (HOSPITAL_COMMUNITY): Payer: Self-pay

## 2021-01-13 ENCOUNTER — Other Ambulatory Visit: Payer: Self-pay

## 2021-01-13 DIAGNOSIS — E1169 Type 2 diabetes mellitus with other specified complication: Secondary | ICD-10-CM

## 2021-01-13 DIAGNOSIS — H60509 Unspecified acute noninfective otitis externa, unspecified ear: Secondary | ICD-10-CM | POA: Diagnosis present

## 2021-01-13 DIAGNOSIS — H6021 Malignant otitis externa, right ear: Secondary | ICD-10-CM

## 2021-01-13 DIAGNOSIS — H6000 Abscess of external ear, unspecified ear: Secondary | ICD-10-CM

## 2021-01-13 DIAGNOSIS — H60391 Other infective otitis externa, right ear: Secondary | ICD-10-CM

## 2021-01-13 LAB — CBC WITH DIFFERENTIAL/PLATELET
Abs Immature Granulocytes: 0.02 10*3/uL (ref 0.00–0.07)
Basophils Absolute: 0.1 10*3/uL (ref 0.0–0.1)
Basophils Relative: 1 %
Eosinophils Absolute: 0.3 10*3/uL (ref 0.0–0.5)
Eosinophils Relative: 3 %
HCT: 33.6 % — ABNORMAL LOW (ref 39.0–52.0)
Hemoglobin: 11.8 g/dL — ABNORMAL LOW (ref 13.0–17.0)
Immature Granulocytes: 0 %
Lymphocytes Relative: 10 %
Lymphs Abs: 1 10*3/uL (ref 0.7–4.0)
MCH: 31.6 pg (ref 26.0–34.0)
MCHC: 35.1 g/dL (ref 30.0–36.0)
MCV: 89.8 fL (ref 80.0–100.0)
Monocytes Absolute: 0.6 10*3/uL (ref 0.1–1.0)
Monocytes Relative: 6 %
Neutro Abs: 8.3 10*3/uL — ABNORMAL HIGH (ref 1.7–7.7)
Neutrophils Relative %: 80 %
Platelets: 161 10*3/uL (ref 150–400)
RBC: 3.74 MIL/uL — ABNORMAL LOW (ref 4.22–5.81)
RDW: 14.6 % (ref 11.5–15.5)
WBC: 10.2 10*3/uL (ref 4.0–10.5)
nRBC: 0 % (ref 0.0–0.2)

## 2021-01-13 LAB — COMPREHENSIVE METABOLIC PANEL
ALT: 26 U/L (ref 0–44)
AST: 15 U/L (ref 15–41)
Albumin: 3.8 g/dL (ref 3.5–5.0)
Alkaline Phosphatase: 82 U/L (ref 38–126)
Anion gap: 13 (ref 5–15)
BUN: 34 mg/dL — ABNORMAL HIGH (ref 6–20)
CO2: 27 mmol/L (ref 22–32)
Calcium: 9.3 mg/dL (ref 8.9–10.3)
Chloride: 92 mmol/L — ABNORMAL LOW (ref 98–111)
Creatinine, Ser: 8.26 mg/dL — ABNORMAL HIGH (ref 0.61–1.24)
GFR, Estimated: 7 mL/min — ABNORMAL LOW (ref >60)
Glucose, Bld: 227 mg/dL — ABNORMAL HIGH (ref 70–99)
Potassium: 4.5 mmol/L (ref 3.5–5.1)
Sodium: 132 mmol/L — ABNORMAL LOW (ref 135–145)
Total Bilirubin: 0.8 mg/dL (ref 0.3–1.2)
Total Protein: 7.5 g/dL (ref 6.5–8.1)

## 2021-01-13 LAB — GLUCOSE, CAPILLARY
Glucose-Capillary: 106 mg/dL — ABNORMAL HIGH (ref 70–99)
Glucose-Capillary: 95 mg/dL (ref 70–99)
Glucose-Capillary: 133 mg/dL — ABNORMAL HIGH (ref 70–99)

## 2021-01-13 LAB — PROTIME-INR
INR: 1 (ref 0.8–1.2)
Prothrombin Time: 13.1 seconds (ref 11.4–15.2)

## 2021-01-13 LAB — URINALYSIS, ROUTINE W REFLEX MICROSCOPIC
Bacteria, UA: NONE SEEN
Bilirubin Urine: NEGATIVE
Glucose, UA: >=500 mg/dL — AB
Hgb urine dipstick: NEGATIVE
Ketones, ur: NEGATIVE mg/dL
Leukocytes,Ua: NEGATIVE
Nitrite: NEGATIVE
Protein, ur: >=300 mg/dL — AB
Specific Gravity, Urine: 1.013 (ref 1.005–1.030)
pH: 7 (ref 5.0–8.0)

## 2021-01-13 LAB — RESP PANEL BY RT-PCR (FLU A&B, COVID) ARPGX2
Influenza A by PCR: NEGATIVE
Influenza B by PCR: NEGATIVE
SARS Coronavirus 2 by RT PCR: NEGATIVE

## 2021-01-13 LAB — APTT: aPTT: 34 seconds (ref 24–36)

## 2021-01-13 LAB — LACTIC ACID, PLASMA: Lactic Acid, Venous: 1.3 mmol/L (ref 0.5–1.9)

## 2021-01-13 LAB — CBG MONITORING, ED: Glucose-Capillary: 174 mg/dL — ABNORMAL HIGH (ref 70–99)

## 2021-01-13 MED ORDER — CHLORHEXIDINE GLUCONATE CLOTH 2 % EX PADS
6.0000 | MEDICATED_PAD | Freq: Every day | CUTANEOUS | Status: DC
  Administered 2021-01-13 – 2021-01-14 (×2): 6 via TOPICAL

## 2021-01-13 MED ORDER — IOHEXOL 300 MG/ML  SOLN
75.0000 mL | Freq: Once | INTRAMUSCULAR | Status: AC | PRN
  Administered 2021-01-13: 75 mL via INTRAVENOUS

## 2021-01-13 MED ORDER — PENTAFLUOROPROP-TETRAFLUOROETH EX AERO: 1.0000 "application " | INHALATION_SPRAY | CUTANEOUS | Status: DC | PRN

## 2021-01-13 MED ORDER — ACETAMINOPHEN 325 MG PO TABS: 650.0000 mg | ORAL_TABLET | Freq: Four times a day (QID) | ORAL | Status: DC | PRN

## 2021-01-13 MED ORDER — LIDOCAINE-PRILOCAINE 2.5-2.5 % EX CREA: 1.0000 "application " | TOPICAL_CREAM | CUTANEOUS | Status: DC | PRN

## 2021-01-13 MED ORDER — INSULIN GLARGINE 100 UNIT/ML ~~LOC~~ SOLN
25.0000 [IU] | Freq: Every day | SUBCUTANEOUS | Status: DC
  Administered 2021-01-13 – 2021-01-14 (×2): 25 [IU] via SUBCUTANEOUS
  Filled 2021-01-13 (×3): qty 0.25

## 2021-01-13 MED ORDER — CIPROFLOXACIN-DEXAMETHASONE 0.3-0.1 % OT SUSP
5.0000 [drp] | Freq: Two times a day (BID) | OTIC | Status: DC
  Administered 2021-01-13 – 2021-01-14 (×3): 5 [drp] via OTIC
  Filled 2021-01-13: qty 7.5

## 2021-01-13 MED ORDER — OXYCODONE HCL 5 MG PO TABS
5.0000 mg | ORAL_TABLET | Freq: Once | ORAL | Status: AC
  Administered 2021-01-13: 5 mg via ORAL
  Filled 2021-01-13: qty 1

## 2021-01-13 MED ORDER — SODIUM CHLORIDE 0.9 % IV SOLN: 100.0000 mL | INTRAVENOUS | Status: DC | PRN

## 2021-01-13 MED ORDER — ALTEPLASE 2 MG IJ SOLR: 2.0000 mg | Freq: Once | INTRAMUSCULAR | Status: DC | PRN

## 2021-01-13 MED ORDER — CIPROFLOXACIN HCL 500 MG PO TABS
500.0000 mg | ORAL_TABLET | Freq: Every day | ORAL | Status: DC
  Administered 2021-01-13: 500 mg via ORAL
  Filled 2021-01-13: qty 1

## 2021-01-13 MED ORDER — INSULIN ASPART 100 UNIT/ML IJ SOLN
8.0000 [IU] | Freq: Three times a day (TID) | INTRAMUSCULAR | Status: DC
  Administered 2021-01-13 – 2021-01-14 (×3): 8 [IU] via SUBCUTANEOUS

## 2021-01-13 MED ORDER — CIPROFLOXACIN-DEXAMETHASONE 0.3-0.1 % OT SUSP
4.0000 [drp] | Freq: Once | OTIC | Status: AC
  Administered 2021-01-13: 4 [drp] via OTIC
  Filled 2021-01-13: qty 7.5

## 2021-01-13 MED ORDER — ATORVASTATIN CALCIUM 40 MG PO TABS
40.0000 mg | ORAL_TABLET | Freq: Every day | ORAL | Status: DC
  Administered 2021-01-13 – 2021-01-14 (×2): 40 mg via ORAL
  Filled 2021-01-13 (×2): qty 1

## 2021-01-13 MED ORDER — HEPARIN SODIUM (PORCINE) 1000 UNIT/ML DIALYSIS: 1000.0000 [IU] | INTRAMUSCULAR | Status: DC | PRN

## 2021-01-13 MED ORDER — LIDOCAINE HCL (PF) 1 % IJ SOLN: 5.0000 mL | INTRAMUSCULAR | Status: DC | PRN

## 2021-01-13 MED ORDER — INSULIN ASPART 100 UNIT/ML IJ SOLN
0.0000 [IU] | Freq: Three times a day (TID) | INTRAMUSCULAR | Status: DC
  Administered 2021-01-13 – 2021-01-14 (×2): 1 [IU] via SUBCUTANEOUS

## 2021-01-13 MED ORDER — KETOROLAC TROMETHAMINE 15 MG/ML IJ SOLN
15.0000 mg | Freq: Once | INTRAMUSCULAR | Status: AC
  Administered 2021-01-13: 15 mg via INTRAVENOUS
  Filled 2021-01-13: qty 1

## 2021-01-13 MED ORDER — SODIUM CHLORIDE 0.9 % IV SOLN
1.0000 g | INTRAVENOUS | Status: DC
  Administered 2021-01-13: 1 g via INTRAVENOUS
  Filled 2021-01-13: qty 10

## 2021-01-13 MED ORDER — SODIUM CHLORIDE 0.9% FLUSH
3.0000 mL | Freq: Two times a day (BID) | INTRAVENOUS | Status: DC
  Administered 2021-01-13 – 2021-01-14 (×3): 3 mL via INTRAVENOUS

## 2021-01-13 MED ORDER — HYDROMORPHONE HCL 2 MG PO TABS: 1.0000 mg | ORAL_TABLET | ORAL | Status: DC | PRN

## 2021-01-13 MED ORDER — ACETAMINOPHEN 650 MG RE SUPP: 650.0000 mg | Freq: Four times a day (QID) | RECTAL | Status: DC | PRN

## 2021-01-13 MED ORDER — SODIUM CHLORIDE 0.9 % IV SOLN: 2.0000 g | INTRAVENOUS | Status: DC

## 2021-01-13 MED ORDER — HEPARIN SODIUM (PORCINE) 5000 UNIT/ML IJ SOLN
5000.0000 [IU] | Freq: Three times a day (TID) | INTRAMUSCULAR | Status: DC
  Administered 2021-01-13 – 2021-01-14 (×3): 5000 [IU] via SUBCUTANEOUS
  Filled 2021-01-13 (×3): qty 1

## 2021-01-13 NOTE — Progress Notes (Signed)
   Subjective:  Eric Glenn is a 55 y.o. with PMH of ERSD, HTN, HLD  admit for malignant otitis externa on hospital day 0  Eric Glenn was examined and evaluated at ED bedside. He was evaluated with assistance of Spanish interpreter. Mentions improvement in his symptoms. Mentions having 'fluid in the ear' sensation  Objective:  Vital signs in last 24 hours: Vitals:   01/13/21 0645 01/13/21 0700 01/13/21 0715 01/13/21 0730  BP: 136/79 140/80 131/74 138/70  Pulse: 64 63 63 61  Resp: '16 12 12 13  '$ Temp:      TempSrc:      SpO2: 95% 97% 99% 97%  Weight:      Height:        Gen: Well-developed, well nourished, NAD HEENT: NCAT head, conductive hearing loss on R, EOMI, PERRL Neck: supple, ROM intact, no JVD CV: RRR, S1, S2 normal, No rubs, no murmurs, no gallops Pulm: CTAB, No rales, no wheezes Abd: Soft, BS+, NTND, No rebound, no guarding Extm: ROM intact, Peripheral pulses intact, No peripheral edema Skin: Dry, Warm, normal turgor, no rashes, lesions, wounds.  Neuro: AAOx3  Assessment/Plan:  Active Problems:   Otitis externa  Eric Glenn is a 55 y.o. with PMH of ERSD, HTN, HLD  admit for malignant otitis externa.  Malignant otitis externa Present w/ ear pain, swellling, CT showing external auditory canal swelling with involvement of middle ear and mastoids. Significant swelling with surrounding tenderness on exam already improving this am. - Appreciate ENT recs - C/w ciprodex ear drops, IV ceftriaxone - F/u cultures  ESRD Tu/Thu/Sa schedule. Last dialysis session 01/10/21. K 4.5 on admission - Dialysis per nephro  T2Dm Am cbg 174, on 35 units lantus qhs at home. Will be important to control in setting of infection - SSI, glucose checks - Start lantus 25 units qhs - Start novolog 8 units tid qc  HTN Am bp 127/66, on amlodipine, hydralazine, carvedilol at home - Restart home bp as needed  DVT prophx: subqhep Diet: renal Bowel:  N/A Code: Full  Prior to Admission Living Arrangement: Home Anticipated Discharge Location: Home Barriers to Discharge: Medical tx Dispo: Anticipated discharge in approximately 1-2 day(s).   Eric Anis, MD 01/13/2021, 7:58 AM Pager: [phone] After 5pm on weekdays and 1pm on weekends: On Call Pager: [phone]

## 2021-01-13 NOTE — Consult Note (Signed)
Reason for Consult: right otalgia Referring Physician: [name] is an 55 y.o. male.  HPI: Patient with DM2 and several days of worsening right sided ear pain and hearing loss.  Treated as outpatient with drops - no better.  No ear surgery in past.  No problems with facial movement or dry eye.  No vertigo.  No fevers or chills.  Past Medical History:  Diagnosis Date   Anemia    Diabetes ([place])    Diabetes mellitus type 2, controlled ([place]) 08/04/2015   Diabetic retinopathy ([place])    Diabetic retinopathy ([place])    Dialysis patient ([place])    Diarrhea    ESRD (end stage renal disease) ([place]) 03/10/2016   High cholesterol    Hypertension    Nausea 08/2017    Past Surgical History:  Procedure Laterality Date   AV FISTULA PLACEMENT Right 09/04/2015   Procedure: ARTERIOVENOUS (AV) FISTULA CREATION;  Surgeon: Angelia Mould, MD;  Location: [facility];  Service: Vascular;  Laterality: Right;   AV FISTULA PLACEMENT Right 01/22/2016   Procedure: RIGHT UPPER ARM BRACHIOCEPHALIC ARTERIOVENOUS (AV) FISTULA CREATION;  Surgeon: Angelia Mould, MD;  Location: [facility];  Service: Vascular;  Laterality: Right;   IR DIALY SHUNT INTRO [place] W/IMG RIGHT Right 12/08/2018   IR DIALY SHUNT INTRO [place] W/IMG RIGHT Right 08/18/2020   LIGATION OF ARTERIOVENOUS  FISTULA Right 01/22/2016   Procedure: LIGATION OF RIGHT FOREARM ARTERIOVENOUS  FISTULA;  Surgeon: Angelia Mould, MD;  Location: [facility];  Service: Vascular;  Laterality: Right;   PERIPHERAL VASCULAR CATHETERIZATION N/A 01/12/2016   Procedure: Fistulagram;  Surgeon: Angelia Mould, MD;  Location: [facility];  Service: Cardiovascular;  Laterality: N/A;   UMBILICAL HERNIA REPAIR      Family History  Problem Relation Age of Onset   Hypertension Mother    Hyperlipidemia Mother    Diabetes Mellitus II Sister     Social History:  reports that he quit smoking about 17 years ago. He  has never used smokeless tobacco. He reports that he does not drink alcohol and does not use drugs.  Allergies:  Allergies  Allergen Reactions   Other Other (See Comments) and Rash    Raises blood pressure   Poractant Alfa Rash and Other (See Comments)    Raises blood pressure   Pork-Derived Products Rash and Other (See Comments)    Raises blood pressure   Penicillins Hives    Medications: I have reviewed the patient's current medications.  Results for orders placed or performed during the hospital encounter of 01/13/21 (from the past 48 hour(s))  Lactic acid, plasma     Status: None   Collection Time: 01/13/21 12:50 AM  Result Value Ref Range   Lactic Acid, Venous 1.3 0.5 - 1.9 mmol/L    Comment: Performed at [facility], [address], [place], [place] [address]  Comprehensive metabolic panel     Status: Abnormal   Collection Time: 01/13/21 12:50 AM  Result Value Ref Range   Sodium 132 (L) 135 - 145 mmol/L   Potassium 4.5 3.5 - 5.1 mmol/L   Chloride 92 (L) 98 - 111 mmol/L   CO2 27 22 - 32 mmol/L   Glucose, Bld 227 (H) 70 - 99 mg/dL    Comment: Glucose reference range applies only to samples taken after fasting for at least 8 hours.   BUN 34 (H) 6 - 20 mg/dL   Creatinine, Ser 8.26 (H) 0.61 -  1.24 mg/dL   Calcium 9.3 8.9 - 10.3 mg/dL   Total Protein 7.5 6.5 - 8.1 g/dL   Albumin 3.8 3.5 - 5.0 g/dL   AST 15 15 - 41 U/L   ALT 26 0 - 44 U/L   Alkaline Phosphatase 82 38 - 126 U/L   Total Bilirubin 0.8 0.3 - 1.2 mg/dL   GFR, Estimated 7 (L) >60 mL/min    Comment: (NOTE) Calculated using the CKD-EPI Creatinine Equation (2021)    Anion gap 13 5 - 15    Comment: Performed at [facility] [address], [place], [place] [address]  CBC WITH DIFFERENTIAL     Status: Abnormal   Collection Time: 01/13/21 12:50 AM  Result Value Ref Range   WBC 10.2 4.0 - 10.5 K/uL   RBC 3.74 (L) 4.22 - 5.81 MIL/uL   Hemoglobin 11.8 (L) 13.0 - 17.0 g/dL   HCT 33.6 (L) 39.0 - 52.0 %    MCV 89.8 80.0 - 100.0 fL   MCH 31.6 26.0 - 34.0 pg   MCHC 35.1 30.0 - 36.0 g/dL   RDW 14.6 11.5 - 15.5 %   Platelets 161 150 - 400 K/uL   nRBC 0.0 0.0 - 0.2 %   Neutrophils Relative % 80 %   Neutro Abs 8.3 (H) 1.7 - 7.7 K/uL   Lymphocytes Relative 10 %   Lymphs Abs 1.0 0.7 - 4.0 K/uL   Monocytes Relative 6 %   Monocytes Absolute 0.6 0.1 - 1.0 K/uL   Eosinophils Relative 3 %   Eosinophils Absolute 0.3 0.0 - 0.5 K/uL   Basophils Relative 1 %   Basophils Absolute 0.1 0.0 - 0.1 K/uL   Immature Granulocytes 0 %   Abs Immature Granulocytes 0.02 0.00 - 0.07 K/uL    Comment: Performed at [facility], [address], [place], [place] [address]  Protime-INR     Status: None   Collection Time: 01/13/21 12:50 AM  Result Value Ref Range   Prothrombin Time 13.1 11.4 - 15.2 seconds   INR 1.0 0.8 - 1.2    Comment: (NOTE) INR goal varies based on device and disease states. Performed at [facility], [place] [address], [place], [place] [address]   APTT     Status: None   Collection Time: 01/13/21 12:50 AM  Result Value Ref Range   aPTT 34 24 - 36 seconds    Comment: Performed at [facility] [address] [place], [place] [address]  Urinalysis, Routine w reflex microscopic Urine, Clean Catch     Status: Abnormal   Collection Time: 01/13/21 12:50 AM  Result Value Ref Range   Color, Urine YELLOW YELLOW   APPearance CLEAR CLEAR   Specific Gravity, Urine 1.013 1.005 - 1.030   pH 7.0 5.0 - 8.0   Glucose, UA >=500 (A) NEGATIVE mg/dL   Hgb urine dipstick NEGATIVE NEGATIVE   Bilirubin Urine NEGATIVE NEGATIVE   Ketones, ur NEGATIVE NEGATIVE mg/dL   Protein, ur >=300 (A) NEGATIVE mg/dL   Nitrite NEGATIVE NEGATIVE   Leukocytes,Ua NEGATIVE NEGATIVE   RBC / HPF 0-5 0 - 5 RBC/hpf   WBC, UA 0-5 0 - 5 WBC/hpf   Bacteria, UA NONE SEEN NONE SEEN   Squamous Epithelial / LPF 0-5 0 - 5    Comment: Performed at [facility], [place] [address], [place], [place] [address]  Resp  Panel by RT-PCR (Flu A&B, Covid) Nasopharyngeal Swab  Status: None   Collection Time: 01/13/21  4:58 AM   Specimen: Nasopharyngeal Swab; Nasopharyngeal(NP) swabs in vial transport medium  Result Value Ref Range   SARS Coronavirus 2 by RT PCR NEGATIVE NEGATIVE    Comment: (NOTE) SARS-CoV-2 target nucleic acids are NOT DETECTED.  The SARS-CoV-2 RNA is generally detectable in upper respiratory specimens during the acute phase of infection. The lowest concentration of SARS-CoV-2 viral copies this assay can detect is 138 copies/mL. A negative result does not preclude SARS-Cov-2 infection and should not be used as the sole basis for treatment or other patient management decisions. A negative result may occur with  improper specimen collection/handling, submission of specimen other than nasopharyngeal swab, presence of viral mutation(s) within the areas targeted by this assay, and inadequate number of viral copies(<138 copies/mL). A negative result must be combined with clinical observations, patient history, and epidemiological information. The expected result is Negative.  Fact Sheet for Patients:  EntrepreneurPulse.com.au  Fact Sheet for Healthcare Providers:  IncredibleEmployment.be  This test is no t yet approved or cleared by the [place] FDA and  has been authorized for detection and/or diagnosis of SARS-CoV-2 by FDA under an Emergency Use Authorization (EUA). This EUA will remain  in effect (meaning this test can be used) for the duration of the COVID-19 declaration under Section 564(b)(1) of the Act, 21 U.S.C.section 360bbb-3(b)(1), unless the authorization is terminated  or revoked sooner.       Influenza A by PCR NEGATIVE NEGATIVE   Influenza B by PCR NEGATIVE NEGATIVE    Comment: (NOTE) The Xpert Xpress SARS-CoV-2/FLU/RSV plus assay is intended as an aid in the diagnosis of influenza from Nasopharyngeal swab specimens  and should not be used as a sole basis for treatment. Nasal washings and aspirates are unacceptable for Xpert Xpress SARS-CoV-2/FLU/RSV testing.  Fact Sheet for Patients: EntrepreneurPulse.com.au  Fact Sheet for Healthcare Providers: IncredibleEmployment.be  This test is not yet approved or cleared by the [place] FDA and has been authorized for detection and/or diagnosis of SARS-CoV-2 by FDA under an Emergency Use Authorization (EUA). This EUA will remain in effect (meaning this test can be used) for the duration of the COVID-19 declaration under Section 564(b)(1) of the Act, 21 U.S.C. section 360bbb-3(b)(1), unless the authorization is terminated or revoked.  Performed at [facility], [place] [address], [place], [place] [address]   CBG monitoring, ED     Status: Abnormal   Collection Time: 01/13/21  8:02 AM  Result Value Ref Range   Glucose-Capillary 174 (H) 70 - 99 mg/dL    Comment: Glucose reference range applies only to samples taken after fasting for at least 8 hours.  Glucose, capillary     Status: Abnormal   Collection Time: 01/13/21 12:35 PM  Result Value Ref Range   Glucose-Capillary 106 (H) 70 - 99 mg/dL    Comment: Glucose reference range applies only to samples taken after fasting for at least 8 hours.    DG Chest Port 1 View  Result Date: 01/13/2021 CLINICAL DATA:  Sepsis EXAM: PORTABLE CHEST 1 VIEW COMPARISON:  09/08/2020 FINDINGS: Right basilar atelectasis. Cardiomegaly. No focal airspace consolidation or pulmonary edema. Normal pleural spaces. IMPRESSION: Cardiomegaly and right basilar atelectasis. Electronically Signed   By: Ulyses Jarred M.D.   On: 01/13/2021 01:49   CT Temporal Bones W Contrast  Result Date: 01/13/2021 CLINICAL DATA:  55 year old male with increasing right ear pain this week. Started on oral antibiotics yesterday/today. History of diabetes and diabetic  complications. EXAM: CT TEMPORAL BONES WITH  CONTRAST TECHNIQUE: Axial and coronal plane CT imaging of the petrous temporal bones was performed with thin-collimation image reconstruction following intravenous contrast administration. Multiplanar CT image reconstructions were also generated. CONTRAST:  69m OMNIPAQUE IOHEXOL 300 MG/ML  SOLN COMPARISON:  Temporal bone CT 10/28/2019, brain MRI 04/19/2018. FINDINGS: The major vascular structures in the upper neck and at the skull base are enhancing and appear to be patent, including both sigmoid sinuses and IJ bulbs. Left ICA is tortuous at the skull base. Bilateral cavernous ICA calcified plaque is visible. Negative visible brain parenchyma. Only mild paranasal sinus mucosal thickening is identified. In general skull base bone mineralization is within normal limits. Chronic postoperative changes to the right globe. Stable visible orbit and scalp soft tissues. Asymmetric right periauricular soft tissue thickening and stranding involving the external auditory canal on series 3, image 10, see additional details below. The right side pinna does not appear to be asymmetrically enlarged. RIGHT TEMPORAL BONE Moderate to severe new circumferential soft tissue thickening of the right is new since last year (series 3, image 11), and the medial EAC is completely opacified. Associated new subtotal opacification of the right tympanic cavity, relatively sparing Prussak's space and the epitympanum on series 7, image 59. The right scutum appears to remain intact. The right ossicles appear to be intact and normally aligned. The right mastoid antrum remains well aerated (series 5, image 59) although there is new widely scattered right mastoid air cell opacification. Associated fluid levels. But no mastoid coalescence. No osseous erosion identified. LEFT TEMPORAL BONE Left EAC is within normal limits. Clearing of the left tympanic cavity, left mastoid antrum, and left mastoid air cells since last year. Scutum and ossicles appear  intact and normally aligned. No thickening of the left tympanic membrane is evident. Left IAC, cochlea, vestibule, vestibular aqueducts, left semicircular canals and course of the left 7th nerve appear normal. IMPRESSION: 1. Acute soft tissue thickening and inflammation throughout the external auditory canal, completely opacifying the deep EAC, and associated with subtotal fluid/opacification of the right middle ear and mastoids. No associated osseous erosion or other complicating features. Favor acute infectious external otitis with reactive middle ear and mastoid effusions. Diabetes can predispose to PSEUDOMONAS infection of the external ear, which should be strongly suspected if there is significant swelling of the pinna. 2. Normalized left temporal bone since last year. Electronically Signed   By: HGenevie AnnM.D.   On: 01/13/2021 06:28    Review of Systems Blood pressure (!) 141/76, pulse (!) 58, temperature 98.1 F (36.7 C), temperature source Oral, resp. rate 18, height '5\' 4"'$  (1.626 m), weight 97.5 kg, SpO2 95 %. Physical Exam  AAO x 3 NAD/PERRL/EOMI/face without bony stepoffs/skin normal without rash/nasal bones normal with pink mucosa/lips and tongue normal/mastoids non-tender with swollen right ear canal (NO GRANULATION TISSUE IN CANAL)/right ear normal exam/no facial asymmetry/chest sym expansion bilaterally without use of accessory muscles/Cns intact/no cerivcal nodes or masses/salivary glands normal  CT scan temporal bones - No bony destruction. Right canal swelling consistent with acute OE. Middle ear fluid present  Assessment/Plan:  Acute OE right DM2  This looks like simple acute otitis externa.  No evidence clinically or radiographically of malignant OE (no fever, no leukocytosis, pain better, no canal granulation, no bony destruction on CT).  I placed an ear wick in right ear and took culture from canal.    Ciprodex otic twice daily WElza Rafterout in 3-5 days No water in ears  Manage  DM2 Anti-psudomonal ABX - if clinically better may go home on topical drops and PO Cipro.  [name] needs to be removed in 3-5 days.  Marcina Millard 01/13/2021, 12:58 PM

## 2021-01-13 NOTE — ED Notes (Signed)
Patient transported to CT 

## 2021-01-13 NOTE — Progress Notes (Signed)
Pt admitted to 5w room 19 from ED, skin clean dry and intact, vital sighs stale BP 133/79   Pulse 60   Temp 98.4 F (36.9 C) (Oral)   Resp 15   Ht '5\' 4"'$  (1.626 m)   Wt 97.5 kg   SpO2 96%   BMI 36.90 kg/m  Pt and family oriented to room call bell placed within reach.

## 2021-01-13 NOTE — Procedures (Signed)
   I was present at this dialysis session, have reviewed the session itself and made  appropriate changes Kelly Splinter MD [place] pager [phone]   01/13/2021, 4:19 PM

## 2021-01-13 NOTE — H&P (Signed)
Date: 01/13/2021               Patient Name:  Eric Glenn MRN: [id number]  DOB: 1966-06-23 Age / Sex: 55 y.o., male   PCP: Pcp, No         Medical Service: Internal Medicine Teaching Service         Attending Physician: Dr. Randal Buba, April, MD;[place]*    First Contact: Dr. Rosita Kea Pager: [phone]  Second Contact: Tamsen Snider, MD Pager: Cherly Hensen [phone]       After Hours (After 5p/  First Contact Pager: [phone]  weekends / holidays): Second Contact Pager: [phone]   SUBJECTIVE   Chief Complaint: Ear pain  History of Present Illness: Eric Glenn is a 55 y.o. male with a pertinent PMH of anemia, diabetes, ESRD, hyperlipidemia, hypertension, who presents to [facility] with 3-day history of right ear pain.  Patient is accompanied by his niece who is able to translate for him.  She states that the patient has had a 3-day history of right ear pain and localized swelling.  She states that over this time he has had a decrease in hearing in his right ear, pain that radiates to his TMJ with associated pain with chewing, and pain that radiates to his eye without any change in vision. 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 He does have some pain with Jernee Murtaugh swelling over his mastoid.  He denies any focal numbness or tingling over his face.  He denies any foreign object in his right ear.  He denies any associated fever or discharge from his right ear.  He denies any sick contacts, recent travel, or changes in medications.  His last HD session was Saturday.    Medications: Current Meds  Medication Sig   acetaminophen (TYLENOL) 325 MG tablet Take 325-650 mg by mouth every 6 (six) hours as needed for mild pain or headache.   amLODipine (NORVASC) 10 MG tablet Take 1 tablet (10 mg total) by mouth daily.   atorvastatin (LIPITOR) 40 MG tablet TAKE 1 TABLET (40 MG TOTAL) BY MOUTH DAILY. (Patient taking differently: Take 40 mg by mouth daily.)    carvedilol (COREG) 12.5 MG tablet TAKE 1 TABLET (12.5 MG TOTAL) BY MOUTH 2 (TWO) TIMES DAILY. (Patient taking differently: Take 12.5 mg by mouth 2 (two) times daily.)   Dulaglutide (TRULICITY) 1.5 0000000 SOPN Inject 1.5 mg into the skin once a week. (Patient taking differently: Inject 1.5 mg into the skin every Monday.)   glucose blood (CONTOUR NEXT TEST) test strip Test blood glucose 4 times daily   heparin 1000 unit/mL SOLN injection Given at dialysis   hydrALAZINE (APRESOLINE) 100 MG tablet TAKE 1 TABLET (100 MG TOTAL) BY MOUTH 3 (THREE) TIMES DAILY. (Patient taking differently: Take 100 mg by mouth 3 (three) times daily.)   insulin glargine (LANTUS) 100 UNIT/ML Solostar Pen INJECT 35 UNITS INTO THE SKIN 2 TIMES DAILY. (Patient taking differently: Inject 35 Units into the skin 2 (two) times daily.)   Insulin Pen Needle 32G X 4 MM MISC USE AS DIRECTED TWICE DAILY.   iron sucrose in sodium chloride 0.9 % 100 mL Given at dialysis   Lancets MISC Test blood glucose 4 times daily   methocarbamol (ROBAXIN) 500 MG tablet Take 1 tablet (500 mg total) by mouth 2 (two) times daily. (Patient taking differently: Take 500 mg by mouth every 6 (six) hours as needed for muscle spasms.)   multivitamin (RENA-VIT) TABS tablet  Take 1 tablet by mouth daily.   Omega-3 Fatty Acids (FISH OIL) 1000 MG CAPS Take 1,000 mg by mouth in the morning and at bedtime.   senna-docusate (SENOKOT S) 8.6-50 MG tablet Take 1 tablet by mouth daily. 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 -niece Level of function -independent with some activities of daily living PCP -IM TS Substance use -none  Family History: Family History  Problem Relation Age of Onset   Hypertension Mother    Hyperlipidemia Mother    Diabetes Mellitus II Sister     Allergies: Allergies as of 01/12/2021 - Review Complete 01/12/2021  Allergen Reaction Noted   Other Other (See Comments) and Rash 08/03/2015   Poractant alfa Rash and Other (See Comments) 01/07/2016   Pork-derived products Rash and Other (See Comments) 08/03/2015   Penicillins Hives 12/03/2019   Past Medical History:  Diagnosis Date   Anemia    Diabetes ([place])    Diabetes mellitus type 2, controlled ([place]) 08/04/2015   Diabetic retinopathy ([place])    Diabetic retinopathy ([place])    Dialysis patient ([place])    Diarrhea    ESRD (end stage renal disease) ([place]) 03/10/2016   High cholesterol    Hypertension    Nausea 08/2017    Review of Systems: A complete ROS was negative except as per HPI.   OBJECTIVE:   Physical Exam: Blood pressure 126/62, pulse 65, temperature 98.7 F (37.1 C), temperature source Oral, resp. rate 13, height '5\' 5"'$  (1.651 m), weight 102 kg, SpO2 94 %. Physical Exam Constitutional:      General: He is in acute distress (due to pain).     Appearance: He is not diaphoretic.  HENT:     Head: Normocephalic and atraumatic.     Comments: Patient has pretty significant periauricular swelling around his mandible, preauricular, and postauricular over his mastoid.  No obvious trismus.  Difficult time evaluated the back of his throat due to body habitus Mallampati score of 3-4.  Does have significant submandibular lymphadenopathy.  No obvious erythema or drainage from the ear.  Although the right ear was swollen making it impossible to visualize the external auditory canal or tympanic membrane    Mouth/Throat:     Mouth: Mucous membranes are moist.     Pharynx: Oropharynx is clear.   Eyes:     Extraocular Movements: Extraocular movements intact.  Cardiovascular:     Rate and Rhythm: Normal rate.     Pulses: Normal pulses.     Heart sounds: Murmur heard.  Pulmonary:     Effort: Pulmonary effort is normal.     Breath sounds: Normal breath sounds.  Abdominal:     General: Abdomen is flat. There is no distension.     Tenderness: There is no abdominal tenderness.  Musculoskeletal:        General: No swelling. Normal range of motion.  Skin:    General: Skin is dry.  Neurological:  General: No focal deficit present.     Mental Status: He is alert and oriented to person, place, and time.  Psychiatric:        Mood and Affect: Mood normal.    Labs: CBC    Component Value Date/Time   WBC 10.2 01/13/2021 0050   RBC 3.74 (L) 01/13/2021 0050   HGB 11.8 (L) 01/13/2021 0050   HGB 10.8 (L) 03/31/2020 1516   HCT 33.6 (L) 01/13/2021 0050   HCT 32.6 (L) 03/31/2020 1516   PLT 161 01/13/2021 0050   PLT 111 (L) 03/31/2020 1516   MCV 89.8 01/13/2021 0050   MCV 92 03/31/2020 1516   MCH 31.6 01/13/2021 0050   MCHC 35.1 01/13/2021 0050   RDW 14.6 01/13/2021 0050   RDW 14.3 03/31/2020 1516   LYMPHSABS 1.0 01/13/2021 0050   MONOABS 0.6 01/13/2021 0050   EOSABS 0.3 01/13/2021 0050   BASOSABS 0.1 01/13/2021 0050     CMP     Component Value Date/Time   NA 132 (L) 01/13/2021 0050   NA 134 03/31/2020 1516   K 4.5 01/13/2021 0050   CL 92 (L) 01/13/2021 0050   CO2 27 01/13/2021 0050   GLUCOSE 227 (H) 01/13/2021 0050   BUN 34 (H) 01/13/2021 0050   BUN 51 (H) 03/31/2020 1516   CREATININE 8.26 (H) 01/13/2021 0050   CREATININE 3.41 (H) 10/01/2015 1108   CALCIUM 9.3 01/13/2021 0050   PROT 7.5 01/13/2021 0050   PROT 7.1 03/31/2020 1516   ALBUMIN 3.8 01/13/2021 0050   ALBUMIN 4.9 03/31/2020 1516   AST 15 01/13/2021 0050   ALT 26 01/13/2021 0050   ALKPHOS 82 01/13/2021 0050   BILITOT 0.8 01/13/2021 0050   BILITOT 0.4 03/31/2020 1516   GFRNONAA 7 (L) 01/13/2021 0050    GFRNONAA 20 (L) 10/01/2015 1108   GFRAA 7 (L) 03/31/2020 1516   GFRAA 23 (L) 10/01/2015 1108    Imaging: DG Chest Port 1 View  Result Date: 01/13/2021 CLINICAL DATA:  Sepsis EXAM: PORTABLE CHEST 1 VIEW COMPARISON:  09/08/2020 FINDINGS: Right basilar atelectasis. Cardiomegaly. No focal airspace consolidation or pulmonary edema. Normal pleural spaces. IMPRESSION: Cardiomegaly and right basilar atelectasis. Electronically Signed   By: Ulyses Jarred M.D.   On: 01/13/2021 01:49   CT Temporal Bones W Contrast  Result Date: 01/13/2021 CLINICAL DATA:  55 year old male with increasing right ear pain this week. Started on oral antibiotics yesterday/today. History of diabetes and diabetic complications. EXAM: CT TEMPORAL BONES WITH CONTRAST TECHNIQUE: Axial and coronal plane CT imaging of the petrous temporal bones was performed with thin-collimation image reconstruction following intravenous contrast administration. Multiplanar CT image reconstructions were also generated. CONTRAST:  17m OMNIPAQUE IOHEXOL 300 MG/ML  SOLN COMPARISON:  Temporal bone CT 10/28/2019, brain MRI 04/19/2018. FINDINGS: The major vascular structures in the upper neck and at the skull base are enhancing and appear to be patent, including both sigmoid sinuses and IJ bulbs. Left ICA is tortuous at the skull base. Bilateral cavernous ICA calcified plaque is visible. Negative visible brain parenchyma. Only mild paranasal sinus mucosal thickening is identified. In general skull base bone mineralization is within normal limits. Chronic postoperative changes to the right globe. Stable visible orbit and scalp soft tissues. Asymmetric right periauricular soft tissue thickening and stranding involving the external auditory canal on series 3, image 10, see additional details below. The right side pinna does not appear to be asymmetrically enlarged. RIGHT TEMPORAL BONE Moderate to severe new circumferential soft tissue thickening of the  right is new  since last year (series 3, image 11), and the medial EAC is completely opacified. Associated new subtotal opacification of the right tympanic cavity, relatively sparing Prussak's space and the epitympanum on series 7, image 59. The right scutum appears to remain intact. The right ossicles appear to be intact and normally aligned. The right mastoid antrum remains well aerated (series 5, image 59) although there is new widely scattered right mastoid air cell opacification. Associated fluid levels. But no mastoid coalescence. No osseous erosion identified. LEFT TEMPORAL BONE Left EAC is within normal limits. Clearing of the left tympanic cavity, left mastoid antrum, and left mastoid air cells since last year. Scutum and ossicles appear intact and normally aligned. No thickening of the left tympanic membrane is evident. Left IAC, cochlea, vestibule, vestibular aqueducts, left semicircular canals and course of the left 7th nerve appear normal. IMPRESSION: 1. Acute soft tissue thickening and inflammation throughout the external auditory canal, completely opacifying the deep EAC, and associated with subtotal fluid/opacification of the right middle ear and mastoids. No associated osseous erosion or other complicating features. Favor acute infectious external otitis with reactive middle ear and mastoid effusions. Diabetes can predispose to PSEUDOMONAS infection of the external ear, which should be strongly suspected if there is significant swelling of the pinna. 2. Normalized left temporal bone since last year. Electronically Signed   By: Genevie Ann M.D.   On: 01/13/2021 06:28    EKG: personally reviewed my interpretation is sinus rhythm   ASSESSMENT & PLAN:   Active Problems:   * No active hospital problems. *   Avenir Henney is a 55 y.o. with pertinent PMH of anemia, diabetes, ESRD, hyperlipidemia, hypertension who presented with right ear pain and admit for otitis externa on hospital day 0  #otitis  externa Patient presents with otitis externa with CT scan showing reactive mastoid effusion. He was started on ceftriaxone and ciprodex. ENT was consulted in the ED.  - Will continue Ceftriaxone and ciprodex  - Continue oxycodone 5 mg q 4 hours for pain - Appreciate ENT recs  #T2DM Patient has ESRD with T2DM.  - Will start SSI and CBG monitoring   #ESRD: Last HD was Saturday will reach out to nephrology for in patient HD. - Inpatient HD  #HTN: #HLD: Patient has a history of HTN and HLD on amlodipine 10 mg, carvedilol 12.5 mg, hydralazine 100 mg, and atorvastatin 40 mg.  - Will hold off on antihypertensive medications in the setting of infection  - Will restart atorvastatin 40 mg   Diet: Renal VTE: Heparin IVF: None,None Code: Full  Anticipated Discharge Location: Home Barriers to Discharge: work up   Dispo: Admit patient to Inpatient with expected length of stay greater than 2 midnights.  Signed: Lawerance Cruel, D.O.  Internal Medicine Resident, PGY-2 [facility]  Pager: [phone] 6:56 AM, 01/13/2021   Please contact the on call pager after 5 pm and on weekends at [phone].

## 2021-01-13 NOTE — ED Provider Notes (Signed)
Emergency Medicine Provider Triage Evaluation Note  Eric Glenn , a 55 y.o. male  was evaluated in triage.  Pt complains of 3 days of right ear pain with swelling.  Hx of DM and ESRD.   Review of Systems  Positive: Ear swelling and pain Negative: Fever, chills  Physical Exam  BP (!) 177/84 (BP Location: Left Arm)   Pulse 72   Temp 98.4 F (36.9 C) (Oral)   Resp 20   Ht '5\' 5"'$  (1.651 m)   Wt 102 kg   SpO2 96%   BMI 37.42 kg/m  Gen:   Awake, uncomfortable, rocking in the chair Resp:  Normal effort  MSK:   Moves extremities without difficulty  Other:  Right ear with significant swelling, canal is closed; significant TTP to the mastoid  Medical Decision Making  Medically screening exam initiated at 12:50 AM.  Appropriate orders placed.  Eric Glenn was informed that the remainder of the evaluation will be completed by another provider, this initial triage assessment does not replace that evaluation, and the importance of remaining in the ED until their evaluation is complete.  Concern for mastoiditis - w/u pending.   Chasitee Zenker, Gwenlyn Perking 01/13/21 [place], MD 01/13/21 [phone]

## 2021-01-13 NOTE — ED Provider Notes (Addendum)
[facility] EMERGENCY DEPARTMENT Provider Note   CSN: [id number] Arrival date & time: 01/12/21  2356     History Chief Complaint  Patient presents with   Otalgia    Eric Glenn is a 55 y.o. male.  The history is provided by the patient and a relative.  Otalgia Location:  Right Behind ear:  No abnormality Quality:  Aching Severity:  Severe Onset quality:  Gradual Duration:  4 days Timing:  Constant Progression:  Worsening Chronicity:  New Context: not recent URI and not water in ear   Relieved by:  Nothing Worsened by:  Nothing Ineffective treatments: ear drops and Levaquin. Associated symptoms: no fever and no vomiting   Risk factors: no recent travel   Patient is DM with ESRD with ear pain and swelling of the R auricle x 4 days and diminished hearing.      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 09/27/2019   Bilateral leg cramps 08/27/2019   Chronic idiopathic constipation 05/29/2019   ESRD (end stage renal disease) ([place]) 03/10/2016   Nasal septal deviation 12/10/2015   Asthma 10/01/2015   Hypersomnia 10/01/2015   Obesity 10/01/2015   Normocytic anemia 08/28/2015   Hyperlipidemia 08/19/2015   Sinusitis, chronic 08/19/2015   Essential hypertension 08/04/2015   Uncontrolled type 2 diabetes mellitus with complication ([place]) [id number]   Diabetic retinopathy  associated with type 2 diabetes mellitus ([place]) 08/04/2015    Past Surgical History:  Procedure Laterality Date   AV FISTULA PLACEMENT Right 09/04/2015   Procedure: ARTERIOVENOUS (AV) FISTULA CREATION;  Surgeon: Angelia Mould, MD;  Location: [facility];  Service: Vascular;  Laterality: Right;   AV FISTULA PLACEMENT Right 01/22/2016   Procedure: RIGHT UPPER ARM BRACHIOCEPHALIC ARTERIOVENOUS (AV) FISTULA CREATION;  Surgeon: Angelia Mould, MD;  Location: [facility];  Service: Vascular;  Laterality: Right;   IR DIALY SHUNT INTRO [place] W/IMG RIGHT Right 12/08/2018   IR DIALY SHUNT INTRO [place] INITIAL W/IMG RIGHT Right 08/18/2020   LIGATION OF ARTERIOVENOUS  FISTULA Right 01/22/2016   Procedure: LIGATION OF RIGHT FOREARM ARTERIOVENOUS  FISTULA;  Surgeon: Angelia Mould, MD;  Location: [facility];  Service: Vascular;  Laterality: Right;   PERIPHERAL VASCULAR CATHETERIZATION N/A 01/12/2016   Procedure: Fistulagram;  Surgeon: Angelia Mould, MD;  Location: [facility];  Service: Cardiovascular;  Laterality: N/A;   UMBILICAL HERNIA REPAIR         Family History  Problem Relation Age of Onset   Hypertension Mother    Hyperlipidemia Mother    Diabetes Mellitus II Sister     Social History   Tobacco Use   Smoking status: Former    Pack years: 0.00    Types: Cigarettes    Quit date: 10/22/2003    Years since quitting: 17.2   Smokeless tobacco:  Never  Vaping Use   Vaping Use: Never used  Substance Use Topics   Alcohol use: No    Alcohol/week: 0.0 standard drinks   Drug use: No    Home Medications Prior to Admission medications   Medication Sig Start Date End Date Taking? Authorizing Provider  acetaminophen (TYLENOL) 325 MG tablet Take 325-650 mg by mouth every 6 (six) hours as needed for mild pain or headache.    [provider]  amLODipine (NORVASC) 10 MG tablet Take 1 tablet (10 mg total) by mouth daily. 11/18/20   Gaylan Gerold, DO   atorvastatin (LIPITOR) 40 MG tablet TAKE 1 TABLET (40 MG TOTAL) BY MOUTH DAILY. 08/01/20 08/01/21  Jeralyn Bennett, MD  carvedilol (COREG) 12.5 MG tablet TAKE 1 TABLET (12.5 MG TOTAL) BY MOUTH 2 (TWO) TIMES DAILY. 09/01/20 09/01/21  Riesa Pope, MD  ciprofloxacin-hydrocortisone (CIPRO HC) OTIC suspension Place 3 drops into the right ear 2 (two) times daily for 7 days. 01/12/21 01/19/21  Melynda Ripple, MD  Dulaglutide (TRULICITY) 1.5 0000000 SOPN Inject 1.5 mg into the skin once a week. Patient taking differently: Inject 1.5 mg into the skin every Monday. 05/28/19   [name], MD  fluticasone (FLONASE) 50 MCG/ACT nasal spray Place 1-2 sprays into both nostrils daily for 7 days. 01/27/20 02/03/20  Wieters, Hallie C, PA-C  glucose blood (CONTOUR NEXT TEST) test strip Test blood glucose 4 times daily 01/01/19   Welford Roche, MD  hydrALAZINE (APRESOLINE) 100 MG tablet TAKE 1 TABLET (100 MG TOTAL) BY MOUTH 3 (THREE) TIMES DAILY. 07/01/20 07/01/21  Sanjuan Dame, MD  insulin glargine (LANTUS) 100 UNIT/ML Solostar Pen INJECT 35 UNITS INTO THE SKIN 2 TIMES DAILY. 06/06/20 06/06/21  [name], MD  Insulin Pen Needle 32G X 4 MM MISC USE AS DIRECTED TWICE DAILY. 06/06/20 06/06/21  [name], MD  Lancets MISC Test blood glucose 4 times daily 01/01/19   Welford Roche, MD  levofloxacin (LEVAQUIN) 250 MG tablet Take 2 tablets (500 mg) on day #1, then half a tablet every 24 hours for 7 days total 01/12/21   Melynda Ripple, MD  methocarbamol (ROBAXIN) 500 MG tablet Take 1 tablet (500 mg total) by mouth 2 (two) times daily. 11/11/20   Wieters, Hallie C, PA-C  multivitamin (RENA-VIT) TABS tablet Take 1 tablet by mouth daily. 08/04/18   Santos-Sanchez, Merlene Morse, MD  Omega-3 Fatty Acids (FISH OIL) 1000 MG CAPS Take 1,000 mg by mouth in the morning and at bedtime.    [provider]  senna-docusate (SENOKOT S) 8.6-50 MG tablet Take 1 tablet by mouth daily. Patient taking  differently: Take 1 tablet by mouth daily as needed for mild constipation. 05/28/19   [name], MD  triamcinolone cream (KENALOG) 0.1 % Apply 1 application topically 2 (two) times daily. 12/03/19   Welford Roche, MD    Allergies    Other, Poractant alfa, Pork-derived products, and Penicillins  Review of Systems   Review of Systems  Constitutional:  Negative for fever.  HENT:  Positive for ear pain. Negative for drooling.   Eyes:  Negative for redness.  Respiratory:  Negative for wheezing.   Cardiovascular:  Negative for leg swelling.  Gastrointestinal:  Negative for vomiting.  Musculoskeletal:  Negative for neck stiffness.  Skin:  Negative for wound.  Neurological:  Negative for facial asymmetry.  Psychiatric/Behavioral:  Negative for agitation.   All other systems reviewed and are negative.  Physical Exam Updated Vital Signs BP (!) 153/74   Pulse  69   Temp 98.7 F (37.1 C) (Oral)   Resp 14   Ht '5\' 5"'$  (1.651 m)   Wt 102 kg   SpO2 96%   BMI 37.42 kg/m   Physical Exam Vitals and nursing note reviewed.  Constitutional:      Appearance: Normal appearance. He is not diaphoretic.  HENT:     Head: Normocephalic and atraumatic.     Right Ear: Swelling and tenderness present.     Ears:      Nose: Nose normal.  Eyes:     Conjunctiva/sclera: Conjunctivae normal.     Pupils: Pupils are equal, round, and reactive to light.  Cardiovascular:     Rate and Rhythm: Normal rate and regular rhythm.     Pulses: Normal pulses.     Heart sounds: Normal heart sounds.  Pulmonary:     Effort: Pulmonary effort is normal.     Breath sounds: Normal breath sounds.  Abdominal:     General: Abdomen is flat. Bowel sounds are normal.     Palpations: Abdomen is soft.     Tenderness: There is no abdominal tenderness. There is no guarding.  Musculoskeletal:        General: Normal range of motion.     Cervical back: Normal range of motion and neck supple.  Skin:    General: Skin  is warm and dry.     Capillary Refill: Capillary refill takes less than 2 seconds.  Neurological:     General: No focal deficit present.     Mental Status: He is alert and oriented to person, place, and time.  Psychiatric:        Mood and Affect: Mood normal.        Behavior: Behavior normal.    ED Results / Procedures / Treatments   Labs (all labs ordered are listed, but only abnormal results are displayed) Results for orders placed or performed during the hospital encounter of 01/13/21  Lactic acid, plasma  Result Value Ref Range   Lactic Acid, Venous 1.3 0.5 - 1.9 mmol/L  Comprehensive metabolic panel  Result Value Ref Range   Sodium 132 (L) 135 - 145 mmol/L   Potassium 4.5 3.5 - 5.1 mmol/L   Chloride 92 (L) 98 - 111 mmol/L   CO2 27 22 - 32 mmol/L   Glucose, Bld 227 (H) 70 - 99 mg/dL   BUN 34 (H) 6 - 20 mg/dL   Creatinine, Ser 8.26 (H) 0.61 - 1.24 mg/dL   Calcium 9.3 8.9 - 10.3 mg/dL   Total Protein 7.5 6.5 - 8.1 g/dL   Albumin 3.8 3.5 - 5.0 g/dL   AST 15 15 - 41 U/L   ALT 26 0 - 44 U/L   Alkaline Phosphatase 82 38 - 126 U/L   Total Bilirubin 0.8 0.3 - 1.2 mg/dL   GFR, Estimated 7 (L) >60 mL/min   Anion gap 13 5 - 15  CBC WITH DIFFERENTIAL  Result Value Ref Range   WBC 10.2 4.0 - 10.5 K/uL   RBC 3.74 (L) 4.22 - 5.81 MIL/uL   Hemoglobin 11.8 (L) 13.0 - 17.0 g/dL   HCT 33.6 (L) 39.0 - 52.0 %   MCV 89.8 80.0 - 100.0 fL   MCH 31.6 26.0 - 34.0 pg   MCHC 35.1 30.0 - 36.0 g/dL   RDW 14.6 11.5 - 15.5 %   Platelets 161 150 - 400 K/uL   nRBC 0.0 0.0 - 0.2 %   Neutrophils Relative % 80 %  Neutro Abs 8.3 (H) 1.7 - 7.7 K/uL   Lymphocytes Relative 10 %   Lymphs Abs 1.0 0.7 - 4.0 K/uL   Monocytes Relative 6 %   Monocytes Absolute 0.6 0.1 - 1.0 K/uL   Eosinophils Relative 3 %   Eosinophils Absolute 0.3 0.0 - 0.5 K/uL   Basophils Relative 1 %   Basophils Absolute 0.1 0.0 - 0.1 K/uL   Immature Granulocytes 0 %   Abs Immature Granulocytes 0.02 0.00 - 0.07 K/uL  Protime-INR   Result Value Ref Range   Prothrombin Time 13.1 11.4 - 15.2 seconds   INR 1.0 0.8 - 1.2  APTT  Result Value Ref Range   aPTT 34 24 - 36 seconds  Urinalysis, Routine w reflex microscopic Urine, Clean Catch  Result Value Ref Range   Color, Urine YELLOW YELLOW   APPearance CLEAR CLEAR   Specific Gravity, Urine 1.013 1.005 - 1.030   pH 7.0 5.0 - 8.0   Glucose, UA >=500 (A) NEGATIVE mg/dL   Hgb urine dipstick NEGATIVE NEGATIVE   Bilirubin Urine NEGATIVE NEGATIVE   Ketones, ur NEGATIVE NEGATIVE mg/dL   Protein, ur >=300 (A) NEGATIVE mg/dL   Nitrite NEGATIVE NEGATIVE   Leukocytes,Ua NEGATIVE NEGATIVE   RBC / HPF 0-5 0 - 5 RBC/hpf   WBC, UA 0-5 0 - 5 WBC/hpf   Bacteria, UA NONE SEEN NONE SEEN   Squamous Epithelial / LPF 0-5 0 - 5   DG Chest Port 1 View  Result Date: 01/13/2021 CLINICAL DATA:  Sepsis EXAM: PORTABLE CHEST 1 VIEW COMPARISON:  09/08/2020 FINDINGS: Right basilar atelectasis. Cardiomegaly. No focal airspace consolidation or pulmonary edema. Normal pleural spaces. IMPRESSION: Cardiomegaly and right basilar atelectasis. Electronically Signed   By: Ulyses Jarred M.D.   On: 01/13/2021 01:49     EKG None  Radiology DG Chest Port 1 View  Result Date: 01/13/2021 CLINICAL DATA:  Sepsis EXAM: PORTABLE CHEST 1 VIEW COMPARISON:  09/08/2020 FINDINGS: Right basilar atelectasis. Cardiomegaly. No focal airspace consolidation or pulmonary edema. Normal pleural spaces. IMPRESSION: Cardiomegaly and right basilar atelectasis. Electronically Signed   By: Ulyses Jarred M.D.   On: 01/13/2021 01:49    Procedures Procedures   Medications Ordered in ED Medications  cefTRIAXone (ROCEPHIN) 1 g in sodium chloride 0.9 % 100 mL IVPB (has no administration in time range)  ciprofloxacin-dexamethasone (CIPRODEX) 0.3-0.1 % OTIC (EAR) suspension 4 drop (has no administration in time range)  oxyCODONE (Oxy IR/ROXICODONE) immediate release tablet 5 mg (5 mg Oral Given 01/13/21 0057)  ketorolac (TORADOL)  15 MG/ML injection 15 mg (15 mg Intravenous Given 01/13/21 0515)    ED Course  I have reviewed the triage vital signs and the nursing notes.  Pertinent labs & imaging results that were available during my care of the patient were reviewed by me and considered in my medical decision making (see chart for details).   After consult with pharmacy, Rocephin chosen as patient has tolerated cephalosporins    Exam is consistent with malignant otitis externa.  CT pending for mastoiditis.  Have admitted the patient to medicine and will consult ENT.    645 am case d/w Dr. Marcelline Deist of ENT who will consult on the patient's care. Dr. Marcelline Deist added to the treatment team.     Eric Glenn was evaluated in Emergency Department on 01/13/2021 for the symptoms described in the history of present illness. He was evaluated in the context of the global COVID-19 pandemic, which necessitated consideration that the patient might be at  risk for infection with the SARS-CoV-2 virus that causes COVID-19. Institutional protocols and algorithms that pertain to the evaluation of patients at risk for COVID-19 are in a state of rapid change based on information released by regulatory bodies including the CDC and federal and state organizations. These policies and algorithms were followed during the patient's care in the ED.  Final Clinical Impression(s) / ED Diagnoses Final diagnoses:  Acute malignant otitis externa of right ear   Admit to teaching service  Rx / DC Orders ED Discharge Orders     None        Asyia Hornung, MD 01/13/21 [place], Collan Schoenfeld, MD 01/13/21 [place], Brealynn Contino, MD 01/13/21 [id number]

## 2021-01-13 NOTE — Consult Note (Signed)
[facility] Renal Consultation Note    Indication for Consultation:  Management of ESRD/hemodialysis; anemia, hypertension/volume and secondary hyperparathyroidism  PCP:Pcp, No  HPI: Eric Glenn is a 55 y.o. male with ESRD on HD TTS at [facility]. His past medical history significant for anemia, diabetes, hyperlipidemia, and HTN. His last HD was on 01/10/21 where he received full treatment. Patient presents to the ER c/o R ear pain and localized swelling. ENT is on board. Seen and examined patient during HD treatment today. He appears comfortable with no signs of acute respiratory distress. Plan to continue HD treatments per his usual schedule during hospitalization.  Past Medical History:  Diagnosis Date   Anemia    Diabetes ([place])    Diabetes mellitus type 2, controlled ([place]) 08/04/2015   Diabetic retinopathy ([place])    Diabetic retinopathy ([place])    Dialysis patient ([place])    Diarrhea    ESRD (end stage renal disease) ([place]) 03/10/2016   High cholesterol    Hypertension    Nausea 08/2017   Past Surgical History:  Procedure Laterality Date   AV FISTULA PLACEMENT Right 09/04/2015   Procedure: ARTERIOVENOUS (AV) FISTULA CREATION;  Surgeon: Angelia Mould, MD;  Location: [place];  Service: Vascular;  Laterality: Right;   AV FISTULA PLACEMENT Right 01/22/2016   Procedure: RIGHT UPPER ARM BRACHIOCEPHALIC ARTERIOVENOUS (AV) FISTULA CREATION;  Surgeon: Angelia Mould, MD;  Location: [place];  Service: Vascular;  Laterality: Right;   IR DIALY SHUNT INTRO Lane W/IMG RIGHT Right 12/08/2018   IR DIALY SHUNT INTRO [place] W/IMG RIGHT Right 08/18/2020   LIGATION OF ARTERIOVENOUS  FISTULA Right 01/22/2016   Procedure: LIGATION OF RIGHT FOREARM ARTERIOVENOUS  FISTULA;  Surgeon: Angelia Mould, MD;  Location: [place];  Service: Vascular;  Laterality: Right;   PERIPHERAL VASCULAR CATHETERIZATION N/A 01/12/2016   Procedure:  Fistulagram;  Surgeon: Angelia Mould, MD;  Location: [place] CV LAB;  Service: Cardiovascular;  Laterality: N/A;   UMBILICAL HERNIA REPAIR     Family History  Problem Relation Age of Onset   Hypertension Mother    Hyperlipidemia Mother    Diabetes Mellitus II Sister    Social History:  reports that he quit smoking about 17 years ago. He has never used smokeless tobacco. He reports that he does not drink alcohol and does not use drugs. Allergies  Allergen Reactions   Other Other (See Comments) and Rash    Raises blood pressure   Poractant Alfa Rash and Other (See Comments)    Raises blood pressure   Pork-Derived Products Rash and Other (See Comments)    Raises blood pressure   Penicillins Hives   Prior to Admission medications   Medication Sig Start Date End Date Taking? Authorizing Provider  acetaminophen (TYLENOL) 325 MG tablet Take 325-650 mg by mouth every 6 (six) hours as needed for mild pain or headache.   Yes [provider]  amLODipine (NORVASC) 10 MG tablet Take 1 tablet (10 mg total) by mouth daily. 11/18/20  Yes Gaylan Gerold, DO  atorvastatin (LIPITOR) 40 MG tablet TAKE 1 TABLET (40 MG TOTAL) BY MOUTH DAILY. Patient taking differently: Take 40 mg by mouth daily. 08/01/20 08/01/21 Yes Jeralyn Bennett, MD  carvedilol (COREG) 12.5 MG tablet TAKE 1 TABLET (12.5 MG TOTAL) BY MOUTH 2 (TWO) TIMES DAILY. Patient taking differently: Take 12.5 mg by mouth 2 (two) times daily. 09/01/20 09/01/21 Yes Katsadouros, Vasilios, MD  Dulaglutide (TRULICITY) 1.5 QQ/5.9DG SOPN Inject 1.5 mg  into the skin once a week. Patient taking differently: Inject 1.5 mg into the skin every Monday. 05/28/19  Yes [name], MD  glucose blood (CONTOUR NEXT TEST) test strip Test blood glucose 4 times daily 01/01/19  Yes Santos-Sanchez, Merlene Morse, MD  heparin 1000 unit/mL SOLN injection Given at dialysis 03/15/20 03/14/21 Yes [provider]  hydrALAZINE (APRESOLINE) 100 MG tablet TAKE 1  TABLET (100 MG TOTAL) BY MOUTH 3 (THREE) TIMES DAILY. Patient taking differently: Take 100 mg by mouth 3 (three) times daily. 07/01/20 07/01/21 Yes Sanjuan Dame, MD  insulin glargine (LANTUS) 100 UNIT/ML Solostar Pen INJECT 35 UNITS INTO THE SKIN 2 TIMES DAILY. Patient taking differently: Inject 35 Units into the skin 2 (two) times daily. 06/06/20 06/06/21 Yes [name], MD  Insulin Pen Needle 32G X 4 MM MISC USE AS DIRECTED TWICE DAILY. 06/06/20 06/06/21 Yes [name], MD  iron sucrose in sodium chloride 0.9 % 100 mL Given at dialysis 02/21/20 12/31/21 Yes [provider]  Lancets MISC Test blood glucose 4 times daily 01/01/19  Yes Santos-Sanchez, Merlene Morse, MD  methocarbamol (ROBAXIN) 500 MG tablet Take 1 tablet (500 mg total) by mouth 2 (two) times daily. Patient taking differently: Take 500 mg by mouth every 6 (six) hours as needed for muscle spasms. 11/11/20  Yes Wieters, Hallie C, PA-C  multivitamin (RENA-VIT) TABS tablet Take 1 tablet by mouth daily. 08/04/18  Yes Santos-Sanchez, Merlene Morse, MD  Omega-3 Fatty Acids (FISH OIL) 1000 MG CAPS Take 1,000 mg by mouth in the morning and at bedtime.   Yes [provider]  senna-docusate (SENOKOT S) 8.6-50 MG tablet Take 1 tablet by mouth daily. Patient taking differently: Take 1 tablet by mouth daily as needed for mild constipation. 05/28/19  Yes [name], MD  ciprofloxacin-hydrocortisone (CIPRO [facility]) OTIC suspension Place 3 drops into the right ear 2 (two) times daily for 7 days. 01/12/21 01/19/21  Melynda Ripple, MD  levofloxacin (LEVAQUIN) 250 MG tablet Take 2 tablets (500 mg) on day #1, then half a tablet every 24 hours for 7 days total Patient not taking: No sig reported 01/12/21   Melynda Ripple, MD  triamcinolone cream (KENALOG) 0.1 % Apply 1 application topically 2 (two) times daily. Patient not taking: No sig reported 12/03/19   Welford Roche, MD   Current Facility-Administered Medications  Medication Dose  Route Frequency Provider Last Rate Last Admin   0.9 %  sodium chloride infusion  100 mL Intravenous PRN Adelfa Koh, NP       0.9 %  sodium chloride infusion  100 mL Intravenous PRN Adelfa Koh, NP       acetaminophen (TYLENOL) tablet 650 mg  650 mg Oral Q6H PRN Marianna Payment, MD       Or   acetaminophen (TYLENOL) suppository 650 mg  650 mg Rectal Q6H PRN Marianna Payment, MD       alteplase (CATHFLO ACTIVASE) injection 2 mg  2 mg Intracatheter Once PRN Adelfa Koh, NP       atorvastatin (LIPITOR) tablet 40 mg  40 mg Oral Daily Marianna Payment, MD   40 mg at 01/13/21 1031   Chlorhexidine Gluconate Cloth 2 % PADS 6 each  6 each Topical Q0600 Adelfa Koh, NP   6 each at 01/13/21 1041   ciprofloxacin (CIPRO) tablet 500 mg  500 mg Oral Daily [name], MD       ciprofloxacin-dexamethasone (CIPRODEX) 0.3-0.1 % OTIC (EAR) suspension 5 drop  5  drop Right EAR BID Marcina Millard, MD       heparin injection 1,000 Units  1,000 Units Dialysis PRN Adelfa Koh, NP       heparin injection 5,000 Units  5,000 Units Subcutaneous Camelia Phenes Marianna Payment, MD   5,000 Units at 01/13/21 0825   HYDROmorphone (DILAUDID) tablet 1 mg  1 mg Oral Q4H PRN [name], MD       insulin aspart (novoLOG) injection 0-6 Units  0-6 Units Subcutaneous TID [facility] Marianna Payment, MD   1 Units at 01/13/21 0825   insulin aspart (novoLOG) injection 8 Units  8 Units Subcutaneous TID WC [name], MD       insulin glargine (LANTUS) injection 25 Units  25 Units Subcutaneous Q breakfast [name], MD   25 Units at 01/13/21 1031   lidocaine (PF) (XYLOCAINE) 1 % injection 5 mL  5 mL Intradermal PRN Adelfa Koh, NP       lidocaine-prilocaine (EMLA) cream 1 application  1 application Topical PRN Adelfa Koh, NP       pentafluoroprop-tetrafluoroeth (GEBAUERS) aerosol 1 application  1 application Topical PRN Adelfa Koh, NP       sodium chloride flush (NS) 0.9 % injection 3  mL  3 mL Intravenous Q12H Marianna Payment, MD   3 mL at 01/13/21 1031   Labs: Basic Metabolic Panel: Recent Labs  Lab 01/13/21 0050  NA 132*  K 4.5  CL 92*  CO2 27  GLUCOSE 227*  BUN 34*  CREATININE 8.26*  CALCIUM 9.3   Liver Function Tests: Recent Labs  Lab 01/13/21 0050  AST 15  ALT 26  ALKPHOS 82  BILITOT 0.8  PROT 7.5  ALBUMIN 3.8   No results for input(s): LIPASE, AMYLASE in the last 168 hours. No results for input(s): AMMONIA in the last 168 hours. CBC: Recent Labs  Lab 01/13/21 0050  WBC 10.2  NEUTROABS 8.3*  HGB 11.8*  HCT 33.6*  MCV 89.8  PLT 161   Cardiac Enzymes: No results for input(s): CKTOTAL, CKMB, CKMBINDEX, TROPONINI in the last 168 hours. CBG: Recent Labs  Lab 01/13/21 0802 01/13/21 1235  GLUCAP 174* 106*   Iron Studies: No results for input(s): IRON, TIBC, TRANSFERRIN, FERRITIN in the last 72 hours. Studies/Results: DG Chest Port 1 View  Result Date: 01/13/2021 CLINICAL DATA:  Sepsis EXAM: PORTABLE CHEST 1 VIEW COMPARISON:  09/08/2020 FINDINGS: Right basilar atelectasis. Cardiomegaly. No focal airspace consolidation or pulmonary edema. Normal pleural spaces. IMPRESSION: Cardiomegaly and right basilar atelectasis. Electronically Signed   By: Ulyses Jarred M.D.   On: 01/13/2021 01:49   CT Temporal Bones W Contrast  Result Date: 01/13/2021 CLINICAL DATA:  55 year old male with increasing right ear pain this week. Started on oral antibiotics yesterday/today. History of diabetes and diabetic complications. EXAM: CT TEMPORAL BONES WITH CONTRAST TECHNIQUE: Axial and coronal plane CT imaging of the petrous temporal bones was performed with thin-collimation image reconstruction following intravenous contrast administration. Multiplanar CT image reconstructions were also generated. CONTRAST:  22m OMNIPAQUE IOHEXOL 300 MG/ML  SOLN COMPARISON:  Temporal bone CT 10/28/2019, brain MRI 04/19/2018. FINDINGS: The major vascular structures in the upper neck  and at the skull base are enhancing and appear to be patent, including both sigmoid sinuses and IJ bulbs. Left ICA is tortuous at the skull base. Bilateral cavernous ICA calcified plaque is visible. Negative visible brain parenchyma. Only mild paranasal sinus mucosal thickening is identified. In general skull base bone mineralization  is within normal limits. Chronic postoperative changes to the right globe. Stable visible orbit and scalp soft tissues. Asymmetric right periauricular soft tissue thickening and stranding involving the external auditory canal on series 3, image 10, see additional details below. The right side pinna does not appear to be asymmetrically enlarged. RIGHT TEMPORAL BONE Moderate to severe new circumferential soft tissue thickening of the right is new since last year (series 3, image 11), and the medial EAC is completely opacified. Associated new subtotal opacification of the right tympanic cavity, relatively sparing Prussak's space and the epitympanum on series 7, image 59. The right scutum appears to remain intact. The right ossicles appear to be intact and normally aligned. The right mastoid antrum remains well aerated (series 5, image 59) although there is new widely scattered right mastoid air cell opacification. Associated fluid levels. But no mastoid coalescence. No osseous erosion identified. LEFT TEMPORAL BONE Left EAC is within normal limits. Clearing of the left tympanic cavity, left mastoid antrum, and left mastoid air cells since last year. Scutum and ossicles appear intact and normally aligned. No thickening of the left tympanic membrane is evident. Left IAC, cochlea, vestibule, vestibular aqueducts, left semicircular canals and course of the left 7th nerve appear normal. IMPRESSION: 1. Acute soft tissue thickening and inflammation throughout the external auditory canal, completely opacifying the deep EAC, and associated with subtotal fluid/opacification of the right middle ear  and mastoids. No associated osseous erosion or other complicating features. Favor acute infectious external otitis with reactive middle ear and mastoid effusions. Diabetes can predispose to PSEUDOMONAS infection of the external ear, which should be strongly suspected if there is significant swelling of the pinna. 2. Normalized left temporal bone since last year. Electronically Signed   By: Genevie Ann M.D.   On: 01/13/2021 06:28    Vitals:   01/13/21 1458 01/13/21 1528 01/13/21 1554 01/13/21 1604  BP: (!) 141/72 134/70 (!) 144/73 (!) 146/75  Pulse: (!) 56 (!) 57 (!) 56 (!) 55  Resp:    14  Temp:    98.1 F (36.7 C)  TempSrc:    Oral  SpO2:    98%  Weight:    94.9 kg  Height:       Physical Exam General: WDWN NAD Head: sclera not icteric Lungs: CTA bilaterally. No wheeze, rales or rhonchi. Breathing is unlabored. Heart: RRR. No murmur, rubs or gallops.  Abdomen: soft, nontender, +BS, no guarding, no rebound tenderness Lower extremities:no edema, ischemic changes, or open wounds  Neuro: Patient's primary language is Spanish; moves all extremities spontaneously. Dialysis Access: R AVF (+) Bruit/Thrill  Dialysis Orders:  MWF- [facility]  4hrs, BFR 400, DFR 1.5 Auto flow,  EDW: 96kg, 2K/ 2Ca  Heparin 4000 unit bolus with HD treatments Calcitriol 1.91mg PO qHD-lst dose 01/10/21 Venofer 542mIV weekly-last dose 01/08/21 Auryxia 21073mtabs TID with meals/ 2 tabs with snacks BID  Last Labs: Hgb 11.8, K 4.5, Ca 9.3, Alb 3.8  Assessment/Plan:  Otitis Externa: CT scan (+) for reactive mastoid effusion-on Ceftriaxone and Ciprodex-ENT following  ESRD - on HD MWF-completed tx today per usual schedule. Tolerated net UF 2L. Plan for HD 6/30.  Hypertension/volume  - BP stable, patient currently euvolemic.  Anemia of CKD - Hgb 11.8 at goal. No Fe/ESA indicated at this time.  Secondary Hyperparathyroidism -  Ca ok, will check PO4 with next lab draw  Nutrition - Renal diet with fluid  restriction  CouTobie PoetP [facility] 01/13/2021, 4:40  PM

## 2021-01-13 NOTE — ED Triage Notes (Signed)
Patient reports increasing pain at right ear onset this week , prescribed with oral antibiotic today at an urgent care , no drainage or hearing loss.

## 2021-01-13 NOTE — ED Notes (Signed)
Report attempted to inpatient floor x 1. Callback number provided.

## 2021-01-14 LAB — CBC
HCT: 31.6 % — ABNORMAL LOW (ref 39.0–52.0)
Hemoglobin: 10.9 g/dL — ABNORMAL LOW (ref 13.0–17.0)
MCH: 30.8 pg (ref 26.0–34.0)
MCHC: 34.5 g/dL (ref 30.0–36.0)
MCV: 89.3 fL (ref 80.0–100.0)
Platelets: 169 10*3/uL (ref 150–400)
RBC: 3.54 MIL/uL — ABNORMAL LOW (ref 4.22–5.81)
RDW: 14.6 % (ref 11.5–15.5)
WBC: 6.2 10*3/uL (ref 4.0–10.5)
nRBC: 0 % (ref 0.0–0.2)

## 2021-01-14 LAB — RENAL FUNCTION PANEL
Albumin: 3.3 g/dL — ABNORMAL LOW (ref 3.5–5.0)
Anion gap: 11 (ref 5–15)
BUN: 24 mg/dL — ABNORMAL HIGH (ref 6–20)
CO2: 30 mmol/L (ref 22–32)
Calcium: 8.8 mg/dL — ABNORMAL LOW (ref 8.9–10.3)
Chloride: 94 mmol/L — ABNORMAL LOW (ref 98–111)
Creatinine, Ser: 6.73 mg/dL — ABNORMAL HIGH (ref 0.61–1.24)
GFR, Estimated: 9 mL/min — ABNORMAL LOW (ref >60)
Glucose, Bld: 145 mg/dL — ABNORMAL HIGH (ref 70–99)
Phosphorus: 5.1 mg/dL — ABNORMAL HIGH (ref 2.5–4.6)
Potassium: 4.1 mmol/L (ref 3.5–5.1)
Sodium: 135 mmol/L (ref 135–145)

## 2021-01-14 LAB — GLUCOSE, CAPILLARY
Glucose-Capillary: 167 mg/dL — ABNORMAL HIGH (ref 70–99)
Glucose-Capillary: 176 mg/dL — ABNORMAL HIGH (ref 70–99)
Glucose-Capillary: 181 mg/dL — ABNORMAL HIGH (ref 70–99)

## 2021-01-14 LAB — URINE CULTURE: Culture: NO GROWTH

## 2021-01-14 LAB — HIV ANTIBODY (ROUTINE TESTING W REFLEX): HIV Screen 4th Generation wRfx: NONREACTIVE

## 2021-01-14 MED ORDER — SODIUM CHLORIDE 0.9 % IV SOLN: 100.0000 mL | INTRAVENOUS | Status: DC | PRN

## 2021-01-14 MED ORDER — CIPROFLOXACIN-DEXAMETHASONE 0.3-0.1 % OT SUSP: 5.0000 [drp] | Freq: Two times a day (BID) | OTIC | 0 refills | Status: DC

## 2021-01-14 MED ORDER — LIDOCAINE HCL (PF) 1 % IJ SOLN: 5.0000 mL | INTRAMUSCULAR | Status: DC | PRN

## 2021-01-14 MED ORDER — LIDOCAINE-PRILOCAINE 2.5-2.5 % EX CREA: 1.0000 "application " | TOPICAL_CREAM | CUTANEOUS | Status: DC | PRN

## 2021-01-14 MED ORDER — PENTAFLUOROPROP-TETRAFLUOROETH EX AERO: 1.0000 "application " | INHALATION_SPRAY | CUTANEOUS | Status: DC | PRN

## 2021-01-14 MED ORDER — ALTEPLASE 2 MG IJ SOLR: 2.0000 mg | Freq: Once | INTRAMUSCULAR | Status: DC | PRN

## 2021-01-14 NOTE — Hospital Course (Addendum)
[ ]   [name] needs to be removed in 3-5 days. Eric Glenn is a 55 y.o. male with a pertinent PMH of anemia, diabetes, ESRD, hyperlipidemia, hypertension, otitis externa of L ear who presents to [facility] with 3-day history of right ear pain and was found to have otitis externa of right ear.  His hospital course by problem is as follows.  Otitis externa  Patient presented with 3 day history throbbing  pain in right ear concerning for otitis externa. CT scan showing reactive mastoid effusion. Was evaluated by ENT given severity of patient reported symptoms. ENT eval ruled out malignant otitis externa. CT imaging with no bone involvement.  He was started on ceftriaxone and ciprodex with significant improvement. Discharged  home on ciprodex and ciprofloxacin. Would need wick removed in 3-5 days.   ESRD Patient with ESRD on MWToutpatient dialysis. Tolerated one inpatient HD session on 6/28. 2L UF pulled.  HTN Was continued amlodipine, hydralazine, carvedilol

## 2021-01-14 NOTE — Progress Notes (Signed)
[facility] Progress Note   Subjective:     Patient seen and examined at bedside. Patient's niece also at bedside and was able to interpret. Patient currently with no complaints. Noted patient is HOH R ear. Denies SOB, CP, ABD pain, and N/V. He tolerated yesterday's HD with net UF 2L. Plan for HD 6/30.  Objective Vitals:   01/13/21 2049 01/14/21 0100 01/14/21 0758 01/14/21 1100  BP: 131/75 (!) 150/76 132/73 (!) 156/79  Pulse: 68 68 67 63  Resp: '16 18 18 19  ' Temp: 98.6 F (37 C) 98.7 F (37.1 C) 98.4 F (36.9 C) 98.9 F (37.2 C)  TempSrc: Oral Oral Oral Oral  SpO2: 98% 91% 98% 99%  Weight:      Height:       Physical Exam General: WDWN NAD Lungs: CTA bilaterally. No wheeze, rales or rhonchi. Breathing is unlabored. Heart: RRR. No murmur, rubs or gallops. Abdomen: soft, nontender,  Lower extremities:no edema, ischemic changes, or open wounds Neuro: Patient's primary language is Spanish; niece at bedside; moves all extremities spontaneously. Dialysis Access: R AVF (+) Bruit/Thrill  Filed Weights   01/13/21 0939 01/13/21 1300 01/13/21 1604  Weight: 97.5 kg 96.7 kg 94.9 kg    Intake/Output Summary (Last 24 hours) at 01/14/2021 1157 Last data filed at 01/13/2021 1604 Gross per 24 hour  Intake --  Output 2000 ml  Net -2000 ml    Additional Objective Labs: Basic Metabolic Panel: Recent Labs  Lab 01/13/21 0050 01/14/21 0134  NA 132* 135  K 4.5 4.1  CL 92* 94*  CO2 27 30  GLUCOSE 227* 145*  BUN 34* 24*  CREATININE 8.26* 6.73*  CALCIUM 9.3 8.8*  PHOS  --  5.1*   Liver Function Tests: Recent Labs  Lab 01/13/21 0050 01/14/21 0134  AST 15  --   ALT 26  --   ALKPHOS 82  --   BILITOT 0.8  --   PROT 7.5  --   ALBUMIN 3.8 3.3*   No results for input(s): LIPASE, AMYLASE in the last 168 hours. CBC: Recent Labs  Lab 01/13/21 0050 01/14/21 0134  WBC 10.2 6.2  NEUTROABS 8.3*  --   HGB 11.8* 10.9*  HCT 33.6* 31.6*  MCV 89.8 89.3  PLT 161 169    Blood Culture    Component Value Date/Time   SDES EAR 01/13/2021 1253   SPECREQUEST RIGHT CANAL 01/13/2021 1253   CULT  01/13/2021 1253    CULTURE REINCUBATED FOR BETTER GROWTH Performed at [facility] [address] [place], [place] [address]    REPTSTATUS PENDING 01/13/2021 1253    Cardiac Enzymes: No results for input(s): CKTOTAL, CKMB, CKMBINDEX, TROPONINI in the last 168 hours. CBG: Recent Labs  Lab 01/13/21 1235 01/13/21 1654 01/13/21 2052 01/14/21 0822 01/14/21 1143  GLUCAP 106* 95 133* 176* 181*   Iron Studies: No results for input(s): IRON, TIBC, TRANSFERRIN, FERRITIN in the last 72 hours. Lab Results  Component Value Date   INR 1.0 01/13/2021   INR 1.0 12/08/2018   INR 1.02 04/18/2018   Studies/Results: DG Chest Port 1 View  Result Date: 01/13/2021 CLINICAL DATA:  Sepsis EXAM: PORTABLE CHEST 1 VIEW COMPARISON:  09/08/2020 FINDINGS: Right basilar atelectasis. Cardiomegaly. No focal airspace consolidation or pulmonary edema. Normal pleural spaces. IMPRESSION: Cardiomegaly and right basilar atelectasis. Electronically Signed   By: Ulyses Jarred M.D.   On: 01/13/2021 01:49   CT Temporal Bones W Contrast  Result Date: 01/13/2021 CLINICAL DATA:  55 year old  male with increasing right ear pain this week. Started on oral antibiotics yesterday/today. History of diabetes and diabetic complications. EXAM: CT TEMPORAL BONES WITH CONTRAST TECHNIQUE: Axial and coronal plane CT imaging of the petrous temporal bones was performed with thin-collimation image reconstruction following intravenous contrast administration. Multiplanar CT image reconstructions were also generated. CONTRAST:  71m OMNIPAQUE IOHEXOL 300 MG/ML  SOLN COMPARISON:  Temporal bone CT 10/28/2019, brain MRI 04/19/2018. FINDINGS: The major vascular structures in the upper neck and at the skull base are enhancing and appear to be patent, including both sigmoid sinuses and IJ bulbs. Left ICA is tortuous at  the skull base. Bilateral cavernous ICA calcified plaque is visible. Negative visible brain parenchyma. Only mild paranasal sinus mucosal thickening is identified. In general skull base bone mineralization is within normal limits. Chronic postoperative changes to the right globe. Stable visible orbit and scalp soft tissues. Asymmetric right periauricular soft tissue thickening and stranding involving the external auditory canal on series 3, image 10, see additional details below. The right side pinna does not appear to be asymmetrically enlarged. RIGHT TEMPORAL BONE Moderate to severe new circumferential soft tissue thickening of the right is new since last year (series 3, image 11), and the medial EAC is completely opacified. Associated new subtotal opacification of the right tympanic cavity, relatively sparing Prussak's space and the epitympanum on series 7, image 59. The right scutum appears to remain intact. The right ossicles appear to be intact and normally aligned. The right mastoid antrum remains well aerated (series 5, image 59) although there is new widely scattered right mastoid air cell opacification. Associated fluid levels. But no mastoid coalescence. No osseous erosion identified. LEFT TEMPORAL BONE Left EAC is within normal limits. Clearing of the left tympanic cavity, left mastoid antrum, and left mastoid air cells since last year. Scutum and ossicles appear intact and normally aligned. No thickening of the left tympanic membrane is evident. Left IAC, cochlea, vestibule, vestibular aqueducts, left semicircular canals and course of the left 7th nerve appear normal. IMPRESSION: 1. Acute soft tissue thickening and inflammation throughout the external auditory canal, completely opacifying the deep EAC, and associated with subtotal fluid/opacification of the right middle ear and mastoids. No associated osseous erosion or other complicating features. Favor acute infectious external otitis with reactive  middle ear and mastoid effusions. Diabetes can predispose to PSEUDOMONAS infection of the external ear, which should be strongly suspected if there is significant swelling of the pinna. 2. Normalized left temporal bone since last year. Electronically Signed   By: HGenevie AnnM.D.   On: 01/13/2021 06:28    Medications:   atorvastatin  40 mg Oral Daily   Chlorhexidine Gluconate Cloth  6 each Topical Q0600   ciprofloxacin  500 mg Oral Daily   ciprofloxacin-dexamethasone  5 drop Right EAR BID   heparin  5,000 Units Subcutaneous Q8H   insulin aspart  0-6 Units Subcutaneous TID WC   insulin aspart  8 Units Subcutaneous TID WC   insulin glargine  25 Units Subcutaneous Q breakfast   sodium chloride flush  3 mL Intravenous Q12H    Dialysis Orders: MWF- [facility] 4hrs, BFR 400, DFR 1.5 Auto flow,  EDW: 96kg, 2K/ 2Ca   Heparin 4000 unit bolus with HD treatments Calcitriol 1.561m PO qHD-lst dose 01/10/21 Venofer 5069mV weekly-last dose 01/08/21 Auryxia 210m41mabs TID with meals/ 2 tabs with snacks BID   Assessment/Plan: Otitis Externa: ENT following: CT scan (+) for reactive mastoid effusion-on PO Cipro and  Ciprodex-culture results pending ESRD - on HD MWF-completed tx today per usual schedule. Tolerated net UF 2L. Plan for HD 6/30. Hypertension/volume  - BP stable, patient currently euvolemic. Anemia of CKD - Hgb 10.9-will monitor trend Secondary Hyperparathyroidism -  Ca and PO4 ok. Nutrition - Renal diet with fluid restriction  Tobie Poet, NP [facility] 01/14/2021,11:57 AM  LOS: 1 day

## 2021-01-14 NOTE — Progress Notes (Signed)
Patient discharging home. Vital signs stable at time of discharge as reflected in discharge summary. Discharge instructions given to patient and daughter and verbal understanding returned. No questions at this time.

## 2021-01-14 NOTE — Discharge Summary (Addendum)
Name: Fyodor Kruzan MRN: [id number] DOB: June 25, 1966 55 y.o. PCP: Pcp, No  Date of Admission: 01/13/2021 12:15 AM Date of Discharge: 01/14/2021 Attending Physician: Lucious Groves, DO   Discharge Diagnosis:  Acute Otitis Externa   Discharge Medications: Allergies as of 01/14/2021       Reactions   Other Other (See Comments), Rash   Raises blood pressure   Poractant Alfa Rash, Other (See Comments)   Raises blood pressure   Pork-derived Products Rash, Other (See Comments)   Raises blood pressure   Penicillins Hives        Medication List     STOP taking these medications    Cipro HC OTIC suspension Generic drug: ciprofloxacin-hydrocortisone   triamcinolone cream 0.1 % Commonly known as: KENALOG       TAKE these medications    acetaminophen 325 MG tablet Commonly known as: TYLENOL Take 325-650 mg by mouth every 6 (six) hours as needed for mild pain or headache.   amLODipine 10 MG tablet Commonly known as: NORVASC Take 1 tablet (10 mg total) by mouth daily.   atorvastatin 40 MG tablet Commonly known as: LIPITOR TAKE 1 TABLET (40 MG TOTAL) BY MOUTH DAILY. What changed: how much to take   carvedilol 12.5 MG tablet Commonly known as: COREG TAKE 1 TABLET (12.5 MG TOTAL) BY MOUTH 2 (TWO) TIMES DAILY. What changed: how much to take   ciprofloxacin-dexamethasone OTIC suspension Commonly known as: [place] 5 drops into the right ear 2 (two) times daily.   Contour Next Test test strip Generic drug: glucose blood Test blood glucose 4 times daily   Fish Oil 1000 MG Caps Take 1,000 mg by mouth in the morning and at bedtime.   heparin 1000 unit/mL Soln injection Given at dialysis   hydrALAZINE 100 MG tablet Commonly known as: APRESOLINE TAKE 1 TABLET (100 MG TOTAL) BY MOUTH 3 (THREE) TIMES DAILY. What changed: how much to take   iron sucrose in sodium chloride 0.9 % 100 mL Given at dialysis   Lancets Misc Test blood glucose 4 times  daily   Lantus SoloStar 100 UNIT/ML Solostar Pen Generic drug: insulin glargine INJECT 35 UNITS INTO THE SKIN 2 TIMES DAILY. What changed:  how much to take how to take this when to take this   levofloxacin 250 MG tablet Commonly known as: Levaquin Take 2 tablets (500 mg) on day #1, then half a tablet every 24 hours for 7 days total   methocarbamol 500 MG tablet Commonly known as: ROBAXIN Take 1 tablet (500 mg total) by mouth 2 (two) times daily. What changed:  when to take this reasons to take this   multivitamin Tabs tablet Take 1 tablet by mouth daily.   senna-docusate 8.6-50 MG tablet Commonly known as: Senokot S Take 1 tablet by mouth daily. What changed:  when to take this reasons to take this   Trulicity 1.5 0000000 Sopn Generic drug: Dulaglutide Inject 1.5 mg into the skin once a week. What changed: when to take this   Unifine Pentips 32G X 4 MM Misc Generic drug: Insulin Pen Needle USE AS DIRECTED TWICE DAILY.        Disposition and follow-up:   Mr.Issaac [name] was discharged from [facility] in Stable condition.  At the hospital follow up visit please address:  1.  Wicks need to be removed in 3-5 days  2.  Labs / imaging needed at time of follow-up: none  3.  Pending labs/ test  needing follow-up: ear fluid culture, blood culture  Follow-up Appointments:   Hospital Course by problem list:  Sabre Morioka is a 55 y.o. male with a pertinent PMH of anemia, diabetes, ESRD, hyperlipidemia, hypertension, otitis externa of L ear who presents to [facility] with 3-day history of right ear pain and was found to have otitis externa of right ear.  His hospital course by problem is as follows.  Otitis externa  Patient presented with 3 day history throbbing  pain in right ear concerning for otitis externa. CT scan showing reactive mastoid effusion. Was evaluated by ENT given severity of patient reported symptoms. ENT eval  ruled out malignant otitis externa. CT imaging with no bone involvement.  He was started on ceftriaxone and ciprodex with significant improvement. Discharged  home on ciprodex and oral levofloxacin. Would need wick removed in 3-5 days.   ESRD Patient with ESRD on [facility] dialysis. Tolerated one inpatient HD session on 6/28. 2L UF pulled.  HTN Was continued amlodipine, hydralazine, carvedilol   Discharge Subjective Patient says he is doing better and thinks going home today would be good for him. Reports headache, eye pain and throbbing much improved. Says he stil have pulsatile tinnitus and a 'swooshing' sensation in right ear. Patient reassured by care team it likely due to effusion from the inflammation and would should resolve with time.  Discharge Exam:   BP (!) 156/79 (BP Location: Right Arm)   Pulse 63   Temp 98.9 F (37.2 C) (Oral)   Resp 19   Ht '5\' 4"'$  (1.626 m)   Wt 94.9 kg   SpO2 99%   BMI 35.91 kg/m   General: Pleasant, well-appearing middle-age comfortably laying in bed. No acute distress. Niece present at bedside. Head: Normocephalic. Atraumatic. Right ear erythematous, swollen with yellow crusts in external canal. Non-specific abnormality noted on L eye. CV: RRR. No murmurs, rubs, or gallops. No LE edema Pulmonary: Lungs CTAB. Normal effort. No wheezing or rales. Abdominal: Soft, nontender, nondistended. Normal bowel sounds. Extremities: Palpable pulses. Normal ROM. Skin: Warm and dry. No obvious rash or lesions. Neuro: A&Ox3. Moves all extremities. Normal sensation. No focal deficit. Psych: Normal mood and affect  Pertinent Labs, Studies, and Procedures:  CBC Latest Ref Rng & Units 01/14/2021 01/13/2021 03/31/2020  WBC 4.0 - 10.5 K/uL 6.2 10.2 9.6  Hemoglobin 13.0 - 17.0 g/dL 10.9(L) 11.8(L) 10.8(L)  Hematocrit 39.0 - 52.0 % 31.6(L) 33.6(L) 32.6(L)  Platelets 150 - 400 K/uL 169 161 111(L)    BMP Latest Ref Rng & Units 01/14/2021 01/13/2021 03/31/2020  Glucose 70  - 99 mg/dL 145(H) 227(H) 252(H)  BUN 6 - 20 mg/dL 24(H) 34(H) 51(H)  Creatinine 0.61 - 1.24 mg/dL 6.73(H) 8.26(H) 8.75(H)  BUN/Creat Ratio 9 - 20 - - 6(L)  Sodium 135 - 145 mmol/L 135 132(L) 134  Potassium 3.5 - 5.1 mmol/L 4.1 4.5 5.3(H)  Chloride 98 - 111 mmol/L 94(L) 92(L) 95(L)  CO2 22 - 32 mmol/L '30 27 22  '$ Calcium 8.9 - 10.3 mg/dL 8.8(L) 9.3 10.2     Discharge Instructions: It was pleasure taking care you! You were admitted for infection in your right ear. You were given antibiotics that should help you get better with time.  Do not swim or get your water in your ears until the infections has  cleared Continue the ear drop and oral antibiotics as prescribed.   If you begin to feel worse or have severe headache that do not go away after tylenol or ibuprofen, have  difficulties with breathing or vision changes, call your doctor or go to the emergency room.  Discharge Instructions     Diet - low sodium heart healthy   Complete by: As directed    Increase activity slowly   Complete by: As directed        Signed: Ross Ludwig, Medical Student 01/14/2021, 3:53 PM   Pager:[phone]

## 2021-01-14 NOTE — Progress Notes (Signed)
Culture results pending Afebrile Eric Glenn in place  Agree with anti-pseudomonal ABXs Eric Glenn out tomorrow Ciprodex to continue  As outlined yesterday in my note, I do not think this is malignant OE.  Rather, this is a bad case of acute otitis externa.  It should resolve with ABX therapy.  Remove wick tomorrow.

## 2021-01-16 ENCOUNTER — Other Ambulatory Visit (HOSPITAL_COMMUNITY): Payer: Self-pay

## 2021-01-16 ENCOUNTER — Other Ambulatory Visit: Payer: Self-pay | Admitting: Student

## 2021-01-16 ENCOUNTER — Encounter (HOSPITAL_COMMUNITY): Payer: Self-pay | Admitting: Nephrology

## 2021-01-16 DIAGNOSIS — I10 Essential (primary) hypertension: Secondary | ICD-10-CM

## 2021-01-16 LAB — EAR CULTURE

## 2021-01-16 MED FILL — Hydralazine HCl Tab 100 MG: ORAL | 30 days supply | Qty: 90 | Fill #2 | Status: AC

## 2021-01-16 MED FILL — Insulin Glargine Soln Pen-Injector 100 Unit/ML: SUBCUTANEOUS | 30 days supply | Qty: 21 | Fill #2 | Status: AC

## 2021-01-16 MED FILL — Carvedilol Tab 12.5 MG: ORAL | 30 days supply | Qty: 60 | Fill #2 | Status: AC

## 2021-01-18 LAB — CULTURE, BLOOD (SINGLE)
Culture: NO GROWTH
Special Requests: ADEQUATE

## 2021-01-28 ENCOUNTER — Ambulatory Visit (INDEPENDENT_AMBULATORY_CARE_PROVIDER_SITE_OTHER): Payer: Self-pay | Admitting: Internal Medicine

## 2021-01-28 VITALS — BP 151/76 | HR 72 | Temp 98.1°F | Ht 65.0 in | Wt 214.4 lb

## 2021-01-28 DIAGNOSIS — Z794 Long term (current) use of insulin: Secondary | ICD-10-CM

## 2021-01-28 DIAGNOSIS — E1169 Type 2 diabetes mellitus with other specified complication: Secondary | ICD-10-CM

## 2021-01-28 DIAGNOSIS — H60391 Other infective otitis externa, right ear: Secondary | ICD-10-CM

## 2021-01-28 DIAGNOSIS — Z0000 Encounter for general adult medical examination without abnormal findings: Secondary | ICD-10-CM | POA: Insufficient documentation

## 2021-01-28 LAB — GLUCOSE, CAPILLARY: Glucose-Capillary: 416 mg/dL — ABNORMAL HIGH (ref 70–99)

## 2021-01-28 LAB — POCT GLYCOSYLATED HEMOGLOBIN (HGB A1C): Hemoglobin A1C: 8.7 % — AB (ref 4.0–5.6)

## 2021-01-28 NOTE — Assessment & Plan Note (Signed)
Patient has annual ophthalmology exam on 7/22  A1c checked today, result was 8.7.

## 2021-01-28 NOTE — Assessment & Plan Note (Addendum)
Patient reports compliance with his medications. He notes that he does not check his blood sugars regularly and does not know what they usually run. Stressed the importance of checking his blood sugars daily, patient expressed understanding. Denies any polydipsia, polyuria, or any other sxs at this time. A1C checked today and came back at . Blood glucose 416 today and A1c 8.7. Discussed importance of low carb and low sugar diet.  Note that the A1c could be falsely low due to the patient being on dialysis.  Plan: - Continue lantus 35 U BID and Trulicity weekly - Will consider increasing lantus dose, however, am cautious of this at this time, as the patient does not check his sugars  - Follow up in 3 months for diabetes check  - Will consider referral diabetes coordinator/dietitian in the future, however, will make note that the patient is self pay

## 2021-01-28 NOTE — Patient Instructions (Addendum)
Thank you, Mr.Sheppard [name] for allowing [name] to provide your care today. Today we discussed your recent hospitalization. Hospitalization for otitis externa: I am glad that your heal is healed and you are feeling better! We do not need to refill your ear drops since you are improved. Diabetes: We are going to check your a1c today to see how your sugars have been running. Continue to take your Lantus and Trulicity, as you have been doing!  ESRD: Continue to follow with your kidney doctor and make sure you attend your dialysis session tomorrow    Healthcare maintenance: You have a visit with your eye doctor later this month and we checked your A1c today for your diabetes.    Gracias, [place] Hernndez-Salinas por permitirnos brindarle su atencin hoy. Hoy hablamos de su reciente hospitalizacin. 1. Hospitalizacin por otitis externa: Me alegro de que te hayas curado y te sientas mejor! No necesitamos rellenar sus gotas para los odos ya que est mejorado. 2. Diabetes: Vamos a revisar su a1c hoy para ver cmo han estado funcionando sus azcares. Contine tomando su Lantus y Musician, como lo ha Tora Duck haciendo! 3. ESRD: contine con su nefrlogo y asegrese de asistir a su sesin de dilisis maana 4. Mantenimiento de la atencin mdica: tiene una visita con su oftalmlogo a fines de este mes y hoy revisamos su A1c para Hydrographic surveyor su diabetes. I have ordered the following labs for you:   Lab Orders  POC Hbg A1C    Tests ordered today:  none  Referrals ordered today:   Referral Orders  No referral(s) requested today     I have ordered the following medication/changed the following medications:   Stop the following medications: There are no discontinued medications.   Start the following medications: No orders of the defined types were placed in this encounter.    Follow up: 2-3 months    Remember: to check your blood sugars everyday!   Should you have any questions or  concerns please call the internal medicine clinic at [phone].     Buddy Duty, D.O. [place]

## 2021-01-28 NOTE — Assessment & Plan Note (Signed)
Patient was recently admitted to the hospital for otitis externa of his right hear. Patient was discharged on Ciprodex drops and levofloxacin and completed both courses of these.  Since his discharge, he is remarkably improved and reports no ear tenderness, pain, swelling, or discharge.   Plan: - Improved - Continue to monitor

## 2021-01-28 NOTE — Progress Notes (Signed)
   CC: hospital follow up   HPI:  [name] is a 55 y.o. male with a PMHx of ESRD on HD, HTN, T2DM, HLD, and anemia.  Presenting to the [facility] today for a hospital follow-up regarding otitis externa of his right ear.  Patient was discharged home on Ciprodex drops as well as levofloxacin.  Patient finished both of these medications and is markedly improved.  He denies any ear pain, ear swelling, or drainage at this time.  Patient states that he feels well overall.  Past Medical History:  Diagnosis Date   Anemia    Diabetes ([place])    Diabetes mellitus type 2, controlled ([place]) 08/04/2015   Diabetic retinopathy ([place])    Diabetic retinopathy ([place])    Dialysis patient ([place])    Diarrhea    ESRD (end stage renal disease) ([place]) 03/10/2016   High cholesterol    Hypertension    Nausea 08/2017   Review of Systems:  Review of Systems  Constitutional:  Negative for chills and fever.  HENT:  Negative for ear discharge, ear pain and tinnitus.   Eyes:  Negative for blurred vision and double vision.  Respiratory:  Negative for cough and shortness of breath.   Cardiovascular:  Negative for chest pain and palpitations.  Gastrointestinal:  Negative for abdominal pain, diarrhea, nausea and vomiting.  Genitourinary:  Negative for dysuria and urgency.  Musculoskeletal:  Negative for myalgias and neck pain.  Neurological:  Negative for headaches.  Psychiatric/Behavioral:  Negative for depression. The patient is not nervous/anxious.     Physical Exam:  Vitals:   01/28/21 1503  BP: (!) 151/76  Pulse: 72  Temp: 98.1 F (36.7 C)  TempSrc: Oral  SpO2: 98%  Weight: 214 lb 6.4 oz (97.3 kg)  Height: '5\' 5"'$  (1.651 m)   Physical Exam Constitutional:      General: He is not in acute distress.    Appearance: Normal appearance.  HENT:     Head: Normocephalic and atraumatic.     Right Ear: Tympanic membrane, ear canal and external ear normal.     Left Ear: Tympanic membrane, ear canal and  external ear normal.  Eyes:     Pupils: Pupils are equal, round, and reactive to light.  Cardiovascular:     Rate and Rhythm: Normal rate and regular rhythm.     Pulses: Normal pulses.     Heart sounds: Normal heart sounds. No murmur heard. Pulmonary:     Effort: Pulmonary effort is normal. No respiratory distress.     Breath sounds: Normal breath sounds. No wheezing, rhonchi or rales.  Abdominal:     General: Bowel sounds are normal.     Palpations: Abdomen is soft.     Tenderness: There is no abdominal tenderness.  Musculoskeletal:     Right lower leg: No edema.     Left lower leg: No edema.  Skin:    General: Skin is warm and dry.  Neurological:     General: No focal deficit present.     Mental Status: He is alert and oriented to person, place, and time.  Psychiatric:        Mood and Affect: Mood normal.        Behavior: Behavior normal.     Assessment & Plan:   See Encounters Tab for problem based charting.  Patient seen with Dr. Daryll Drown

## 2021-02-05 NOTE — Progress Notes (Signed)
Internal Medicine Clinic Attending ° °I saw and evaluated the patient.  I personally confirmed the key portions of the history and exam documented by Dr. Atway and I reviewed pertinent patient test results.  The assessment, diagnosis, and plan were formulated together and I agree with the documentation in the resident’s note.  °

## 2021-02-06 LAB — HM DIABETES EYE EXAM

## 2021-02-12 ENCOUNTER — Other Ambulatory Visit (HOSPITAL_COMMUNITY): Payer: Self-pay

## 2021-02-12 ENCOUNTER — Encounter (HOSPITAL_COMMUNITY): Payer: Self-pay | Admitting: Nephrology

## 2021-02-12 MED ORDER — OFLOXACIN 0.3 % OP SOLN
1.0000 [drp] | Freq: Four times a day (QID) | OPHTHALMIC | 0 refills | Status: DC
  Filled 2021-02-12: qty 5, 25d supply, fill #0

## 2021-02-18 ENCOUNTER — Other Ambulatory Visit (HOSPITAL_COMMUNITY): Payer: Self-pay

## 2021-02-18 ENCOUNTER — Other Ambulatory Visit: Payer: Self-pay | Admitting: Student

## 2021-02-18 ENCOUNTER — Encounter (HOSPITAL_COMMUNITY): Payer: Self-pay | Admitting: Nephrology

## 2021-02-18 DIAGNOSIS — I10 Essential (primary) hypertension: Secondary | ICD-10-CM

## 2021-02-19 ENCOUNTER — Encounter: Payer: Self-pay | Admitting: Dietician

## 2021-02-19 ENCOUNTER — Other Ambulatory Visit (HOSPITAL_COMMUNITY): Payer: Self-pay

## 2021-02-19 ENCOUNTER — Encounter (HOSPITAL_COMMUNITY): Payer: Self-pay | Admitting: Nephrology

## 2021-02-19 MED ORDER — CARVEDILOL 12.5 MG PO TABS
12.5000 mg | ORAL_TABLET | Freq: Two times a day (BID) | ORAL | 3 refills | Status: DC
  Filled 2021-02-19: qty 60, 30d supply, fill #0
  Filled 2021-03-20: qty 60, 30d supply, fill #1
  Filled 2021-04-22: qty 60, 30d supply, fill #2
  Filled 2021-05-29: qty 60, 30d supply, fill #3

## 2021-02-27 ENCOUNTER — Encounter (HOSPITAL_COMMUNITY): Payer: Self-pay | Admitting: Nephrology

## 2021-02-27 ENCOUNTER — Other Ambulatory Visit (HOSPITAL_COMMUNITY): Payer: Self-pay

## 2021-02-27 MED FILL — Insulin Pen Needle 32 G X 4 MM (1/6" or 5/32"): 30 days supply | Qty: 100 | Fill #0 | Status: AC

## 2021-02-27 MED FILL — Insulin Glargine Soln Pen-Injector 100 Unit/ML: SUBCUTANEOUS | 30 days supply | Qty: 21 | Fill #3 | Status: AC

## 2021-03-05 ENCOUNTER — Other Ambulatory Visit (HOSPITAL_COMMUNITY): Payer: Self-pay

## 2021-03-06 ENCOUNTER — Other Ambulatory Visit (HOSPITAL_COMMUNITY): Payer: Self-pay

## 2021-03-08 ENCOUNTER — Other Ambulatory Visit (HOSPITAL_COMMUNITY): Payer: Self-pay

## 2021-03-09 ENCOUNTER — Other Ambulatory Visit (HOSPITAL_COMMUNITY): Payer: Self-pay

## 2021-03-10 ENCOUNTER — Other Ambulatory Visit (HOSPITAL_COMMUNITY): Payer: Self-pay

## 2021-03-20 ENCOUNTER — Encounter (HOSPITAL_COMMUNITY): Payer: Self-pay | Admitting: Nephrology

## 2021-03-20 ENCOUNTER — Other Ambulatory Visit (HOSPITAL_COMMUNITY): Payer: Self-pay

## 2021-03-24 ENCOUNTER — Encounter (HOSPITAL_COMMUNITY): Payer: Self-pay | Admitting: Nephrology

## 2021-03-24 ENCOUNTER — Encounter (HOSPITAL_COMMUNITY): Payer: Self-pay | Admitting: Emergency Medicine

## 2021-03-24 ENCOUNTER — Other Ambulatory Visit (HOSPITAL_COMMUNITY): Payer: Self-pay

## 2021-03-24 ENCOUNTER — Other Ambulatory Visit: Payer: Self-pay

## 2021-03-24 ENCOUNTER — Ambulatory Visit (HOSPITAL_COMMUNITY)
Admission: EM | Admit: 2021-03-24 | Discharge: 2021-03-24 | Disposition: A | Discharge status: Home / Self Care | Payer: Self-pay | Attending: Emergency Medicine | Admitting: Emergency Medicine

## 2021-03-24 ENCOUNTER — Ambulatory Visit (INDEPENDENT_AMBULATORY_CARE_PROVIDER_SITE_OTHER): Payer: Self-pay

## 2021-03-24 DIAGNOSIS — R0602 Shortness of breath: Secondary | ICD-10-CM

## 2021-03-24 DIAGNOSIS — U071 COVID-19: Secondary | ICD-10-CM

## 2021-03-24 MED ORDER — ALBUTEROL SULFATE HFA 108 (90 BASE) MCG/ACT IN AERS
1.0000 | INHALATION_SPRAY | Freq: Four times a day (QID) | RESPIRATORY_TRACT | 0 refills | Status: DC | PRN
  Filled 2021-03-24: qty 18, 25d supply, fill #0

## 2021-03-24 NOTE — ED Triage Notes (Signed)
Pt was covid + last Tuesday. C/o SOB and central chest pains when takes a breath. Pt has productive cough. Pt been taking tylenol for fever. And sometimes has nasal congestion. Yesterday started using albuterol inhaler for SOB.

## 2021-03-24 NOTE — Discharge Instructions (Addendum)
You can take Tylenol and/or Ibuprofen as needed for fever reduction and pain relief.   You can use the inhaler as needed for cough and shortness of breath.   For cough: honey 1/2 to 1 teaspoon (you can dilute the honey in water or another fluid).  You can also use guaifenesin and dextromethorphan for cough. You can use a humidifier for chest congestion and cough.  If you don't have a humidifier, you can sit in the bathroom with the hot shower running.     For sore throat: try warm salt water gargles, cepacol lozenges, throat spray, warm tea or water with lemon/honey, popsicles or ice, or OTC cold relief medicine for throat discomfort.    For congestion: take a daily anti-histamine like Zyrtec, Claritin, and a oral decongestant, such as pseudoephedrine.  You can also use Flonase 1-2 sprays in each nostril daily.    It is important to stay hydrated: drink plenty of fluids (water, gatorade/powerade/pedialyte, juices, or teas) to keep your throat moisturized and help further relieve irritation/discomfort.   Return or go to the Emergency Department if symptoms worsen or do not improve in the next few days.

## 2021-03-24 NOTE — ED Provider Notes (Signed)
[place]    CSN: [id number] Arrival date & time: 03/24/21  1151      History   Chief Complaint Chief Complaint  Patient presents with   Shortness of Breath    HPI Eric Glenn is a 54 y.o. male.   Patient here for evaluation of shortness of breath and chest pain that has been worsening since last Tuesday.  Reports testing positive last Tuesday at his dialysis appointment.  Patient has not missed any dialysis appointments.  Reports taking Tylenol with some symptom relief.  Denies any trauma, injury, or other precipitating event.  Denies any specific alleviating or aggravating factors.  Denies any fevers, chest pain, shortness of breath, N/V/D, numbness, tingling, weakness, abdominal pain, or headaches.    The history is provided by the patient.  Shortness of Breath Associated symptoms: fever    Past Medical History:  Diagnosis Date   Anemia    Diabetes ([place])    Diabetes mellitus type 2, controlled ([place]) 08/04/2015   Diabetic retinopathy ([place])    Diabetic retinopathy ([place])    Dialysis patient ([place])    Diarrhea    ESRD (end stage renal disease) ([place]) 03/10/2016   High cholesterol    Hypertension    Nausea 08/2017    Patient Active Problem List   Diagnosis Date Noted   Healthcare maintenance 01/28/2021   Acute otitis externa 01/13/2021   Acute infective otitis externa, right    Onychomycosis 12/14/2020   Fatigue 03/31/2020   Dermatitis of lower extremity 12/03/2019   Ulcer of right foot, limited to breakdown of skin ([place]) 11/28/2019   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 09/27/2019   Bilateral leg cramps 08/27/2019   Chronic idiopathic constipation 05/29/2019   ESRD (end stage renal disease) ([place]) 03/10/2016   Nasal septal deviation 12/10/2015   Asthma 10/01/2015   Hypersomnia 10/01/2015   Obesity 10/01/2015   Normocytic anemia 08/28/2015   Hyperlipidemia 08/19/2015   Sinusitis, chronic 08/19/2015    Essential hypertension 08/04/2015   Type 2 diabetes mellitus with other specified complication ([place]) [id number]   Diabetic retinopathy associated with type 2 diabetes mellitus ([place]) 08/04/2015    Past Surgical History:  Procedure Laterality Date   AV FISTULA PLACEMENT Right 09/04/2015   Procedure: ARTERIOVENOUS (AV) FISTULA CREATION;  Surgeon: Angelia Mould, MD;  Location: [facility];  Service: Vascular;  Laterality: Right;   AV FISTULA PLACEMENT Right 01/22/2016   Procedure: RIGHT UPPER ARM BRACHIOCEPHALIC ARTERIOVENOUS (AV) FISTULA CREATION;  Surgeon: Angelia Mould, MD;  Location: [facility];  Service: Vascular;  Laterality: Right;   IR DIALY SHUNT INTRO [place] W/IMG RIGHT Right 12/08/2018   IR DIALY SHUNT INTRO [place] INITIAL W/IMG RIGHT Right 08/18/2020   LIGATION OF ARTERIOVENOUS  FISTULA Right 01/22/2016   Procedure: LIGATION OF RIGHT FOREARM ARTERIOVENOUS  FISTULA;  Surgeon: Angelia Mould, MD;  Location: [facility];  Service: Vascular;  Laterality: Right;   PERIPHERAL VASCULAR CATHETERIZATION N/A 01/12/2016   Procedure: Fistulagram;  Surgeon: Angelia Mould, MD;  Location: [facility];  Service: Cardiovascular;  Laterality: N/A;   UMBILICAL HERNIA REPAIR         Home Medications    Prior to Admission medications   Medication Sig Start Date End Date Taking? Authorizing Provider  acetaminophen (TYLENOL) 325 MG tablet Take 325-650 mg by mouth every 6 (six) hours as needed for mild pain or headache.    [provider]  albuterol (VENTOLIN HFA) 108 (  90 Base) MCG/ACT inhaler Inhale 1-2 puffs into the lungs every 6 (six) hours as needed for wheezing or shortness of breath. 03/24/21  Yes Pearson Forster, NP  amLODipine (NORVASC) 10 MG tablet Take 1 tablet (10 mg total) by mouth daily. 11/18/20   Gaylan Gerold, DO  atorvastatin (LIPITOR) 40 MG tablet TAKE 1 TABLET (40 MG TOTAL) BY MOUTH DAILY. 08/01/20 08/01/21  Jeralyn Bennett, MD   carvedilol (COREG) 12.5 MG tablet Take 1 tablet (12.5 mg total) by mouth 2 (two) times daily. 02/19/21   Gaylan Gerold, DO  ciprofloxacin-dexamethasone (CIPRODEX) OTIC suspension Place 5 drops into the right ear 2 (two) times daily. 01/14/21   Madalyn Rob, MD  Dulaglutide (TRULICITY) 1.5 0000000 SOPN Inject 1.5 mg into the skin once a week. 05/28/19   [name], MD  glucose blood (CONTOUR NEXT TEST) test strip Test blood glucose 4 times daily 01/01/19   Welford Roche, MD  hydrALAZINE (APRESOLINE) 100 MG tablet TAKE 1 TABLET (100 MG TOTAL) BY MOUTH 3 (THREE) TIMES DAILY. 07/01/20 07/01/21  Sanjuan Dame, MD  insulin glargine (LANTUS) 100 UNIT/ML Solostar Pen INJECT 35 UNITS INTO THE SKIN 2 TIMES DAILY. 06/06/20 06/06/21  [name], MD  Insulin Pen Needle 32G X 4 MM MISC USE AS DIRECTED TWICE DAILY. 06/06/20 06/06/21  [name], MD  iron sucrose in sodium chloride 0.9 % 100 mL Given at dialysis 02/21/20 12/31/21  [provider]  Lancets MISC Test blood glucose 4 times daily 01/01/19   Welford Roche, MD  levofloxacin (LEVAQUIN) 250 MG tablet Take 2 tablets (500 mg) on day #1, then half a tablet every 24 hours for 7 days total 01/12/21   Melynda Ripple, MD  methocarbamol (ROBAXIN) 500 MG tablet Take 1 tablet (500 mg total) by mouth 2 (two) times daily. 11/11/20   Wieters, Hallie C, PA-C  multivitamin (RENA-VIT) TABS tablet Take 1 tablet by mouth daily. 08/04/18   Santos-Sanchez, Merlene [place], MD  ofloxacin (OCUFLOX) 0.3 % ophthalmic solution Place 1 drop into the left eye 4 (four) times daily. Start 1 day before surgery 02/12/21     Omega-3 Fatty Acids (FISH OIL) 1000 MG CAPS Take 1,000 mg by mouth in the morning and at bedtime.    [provider]  senna-docusate (SENOKOT S) 8.6-50 MG tablet Take 1 tablet by mouth daily. 05/28/19   [name], MD    Family History Family History  Problem Relation Age of Onset   Hypertension Mother    Hyperlipidemia Mother     Diabetes Mellitus II Sister     Social History Social History   Tobacco Use   Smoking status: Former    Types: Cigarettes    Quit date: 10/22/2003    Years since quitting: 17.4   Smokeless tobacco: Never  Vaping Use   Vaping Use: Never used  Substance Use Topics   Alcohol use: No    Alcohol/week: 0.0 standard drinks   Drug use: No     Allergies   Other, Poractant alfa, Pork-derived products, and Penicillins   Review of Systems Review of Systems  Constitutional:  Positive for fever.  HENT:  Positive for congestion.   Respiratory:  Positive for chest tightness and shortness of breath.   All other systems reviewed and are negative.   Physical Exam Triage Vital Signs ED Triage Vitals  Enc Vitals Group     BP 03/24/21 1328 (!) 147/73     Pulse Rate 03/24/21 1328 62  Resp 03/24/21 1328 18     Temp 03/24/21 1328 98.5 F (36.9 C)     Temp Source 03/24/21 1328 Oral     SpO2 03/24/21 1328 98 %     Weight --      Height --      Head Circumference --      Peak Flow --      Pain Score 03/24/21 1325 6     Pain Loc --      Pain Edu? --      Excl. in [place]? --    No data found.  Updated Vital Signs BP (!) 147/73 (BP Location: Left Arm)   Pulse 62   Temp 98.5 F (36.9 C) (Oral)   Resp 18   SpO2 98%   Visual Acuity Right Eye Distance:   Left Eye Distance:   Bilateral Distance:    Right Eye Near:   Left Eye Near:    Bilateral Near:     Physical Exam Vitals and nursing note reviewed.  Constitutional:      General: He is not in acute distress.    Appearance: Normal appearance. He is not ill-appearing, toxic-appearing or diaphoretic.  HENT:     Head: Normocephalic and atraumatic.  Eyes:     Conjunctiva/sclera: Conjunctivae normal.     Pupils: Pupils are equal, round, and reactive to light.  Cardiovascular:     Rate and Rhythm: Normal rate and regular rhythm.     Pulses: Normal pulses.     Heart sounds: Normal heart sounds.  Pulmonary:     Effort:  Pulmonary effort is normal.     Breath sounds: Normal breath sounds. No decreased breath sounds.  Abdominal:     General: Abdomen is flat.  Musculoskeletal:        General: Normal range of motion.     Cervical back: Normal range of motion.  Skin:    General: Skin is warm and dry.  Neurological:     General: No focal deficit present.     Mental Status: He is alert and oriented to person, place, and time.  Psychiatric:        Mood and Affect: Mood normal.     UC Treatments / Results  Labs (all labs ordered are listed, but only abnormal results are displayed) Labs Reviewed - No data to display  EKG   Radiology DG Chest 2 View  Result Date: 03/24/2021 CLINICAL DATA:  COVID, shortness of breath EXAM: CHEST - 2 VIEW COMPARISON:  01/13/2021 FINDINGS: Cardiomegaly. Both lungs are clear. Disc degenerative disease of the thoracic spine. IMPRESSION: Cardiomegaly without acute abnormality of the lungs. Electronically Signed   By: Eddie Candle M.D.   On: 03/24/2021 14:20    Procedures Procedures (including critical care time)  Medications Ordered in UC Medications - No data to display  Initial Impression / Assessment and Plan / UC Course  I have reviewed the triage vital signs and the nursing notes.  Pertinent labs & imaging results that were available during my care of the patient were reviewed by me and considered in my medical decision making (see chart for details).    Assessment negative for red flags or concerns.  X-ray shows no acute abnormality of the lungs.  Shortness of breath likely related to COVID-19.  Albuterol inhaler prescribed for shortness of breath and wheezing.  Discussed conservative symptom management as described in discharge instructions.  Follow-up as needed Final Clinical Impressions(s) / UC Diagnoses   Final diagnoses:  COVID     Discharge Instructions      You can take Tylenol and/or Ibuprofen as needed for fever reduction and pain relief.   You  can use the inhaler as needed for cough and shortness of breath.   For cough: honey 1/2 to 1 teaspoon (you can dilute the honey in water or another fluid).  You can also use guaifenesin and dextromethorphan for cough. You can use a humidifier for chest congestion and cough.  If you don't have a humidifier, you can sit in the bathroom with the hot shower running.     For sore throat: try warm salt water gargles, cepacol lozenges, throat spray, warm tea or water with lemon/honey, popsicles or ice, or OTC cold relief medicine for throat discomfort.    For congestion: take a daily anti-histamine like Zyrtec, Claritin, and a oral decongestant, such as pseudoephedrine.  You can also use Flonase 1-2 sprays in each nostril daily.    It is important to stay hydrated: drink plenty of fluids (water, gatorade/powerade/pedialyte, juices, or teas) to keep your throat moisturized and help further relieve irritation/discomfort.   Return or go to the Emergency Department if symptoms worsen or do not improve in the next few days.      ED Prescriptions     Medication Sig Dispense Auth. Provider   albuterol (VENTOLIN HFA) 108 (90 Base) MCG/ACT inhaler Inhale 1-2 puffs into the lungs every 6 (six) hours as needed for wheezing or shortness of breath. 18 g Pearson Forster, NP      PDMP not reviewed this encounter.   Pearson Forster, NP 03/24/21 1450

## 2021-04-15 ENCOUNTER — Other Ambulatory Visit (HOSPITAL_COMMUNITY): Payer: Self-pay

## 2021-04-15 ENCOUNTER — Other Ambulatory Visit: Payer: Self-pay

## 2021-04-17 ENCOUNTER — Other Ambulatory Visit: Payer: Self-pay | Admitting: Internal Medicine

## 2021-04-17 ENCOUNTER — Other Ambulatory Visit (HOSPITAL_COMMUNITY): Payer: Self-pay

## 2021-04-17 DIAGNOSIS — IMO0002 Reserved for concepts with insufficient information to code with codable children: Secondary | ICD-10-CM

## 2021-04-17 DIAGNOSIS — E119 Type 2 diabetes mellitus without complications: Secondary | ICD-10-CM

## 2021-04-20 ENCOUNTER — Other Ambulatory Visit: Payer: Self-pay | Admitting: Internal Medicine

## 2021-04-20 ENCOUNTER — Other Ambulatory Visit (HOSPITAL_COMMUNITY): Payer: Self-pay

## 2021-04-20 ENCOUNTER — Telehealth: Payer: Self-pay

## 2021-04-20 ENCOUNTER — Encounter (HOSPITAL_COMMUNITY): Payer: Self-pay | Admitting: Nephrology

## 2021-04-20 DIAGNOSIS — E119 Type 2 diabetes mellitus without complications: Secondary | ICD-10-CM

## 2021-04-20 MED ORDER — LANTUS SOLOSTAR 100 UNIT/ML ~~LOC~~ SOPN
35.0000 [IU] | PEN_INJECTOR | Freq: Two times a day (BID) | SUBCUTANEOUS | 6 refills | Status: DC
Start: 2021-04-20 — End: 2021-04-20
  Filled 2021-04-20: qty 21, 30d supply, fill #0

## 2021-04-20 MED ORDER — LANTUS SOLOSTAR 100 UNIT/ML ~~LOC~~ SOPN
35.0000 [IU] | PEN_INJECTOR | Freq: Two times a day (BID) | SUBCUTANEOUS | 6 refills | Status: DC
  Filled 2021-04-20: qty 21, 30d supply, fill #0
  Filled 2021-05-29: qty 21, 30d supply, fill #1
  Filled 2021-07-03: qty 21, 30d supply, fill #2
  Filled 2021-08-24: qty 21, 30d supply, fill #3
  Filled 2021-10-02: qty 21, 30d supply, fill #4

## 2021-04-20 NOTE — Telephone Encounter (Signed)
Please schedule pt an appt for diabetes f/u per Dr Marva Panda.  Thanks

## 2021-04-20 NOTE — Addendum Note (Signed)
Addended by: Harvie Heck on: 04/20/2021 04:39 PM   Modules accepted: Orders

## 2021-04-20 NOTE — Telephone Encounter (Signed)
Medication refilled at this time. Please have patient schedule an appointment some time this month for diabetes follow up.

## 2021-04-20 NOTE — Telephone Encounter (Signed)
Call from [facility] - received rx for Insulin glargine (lantus solostar) need to include "IM Program". Please re-send rx. Thanks

## 2021-04-20 NOTE — Telephone Encounter (Signed)
Rx resent with "IM Program"

## 2021-04-22 ENCOUNTER — Other Ambulatory Visit (HOSPITAL_COMMUNITY): Payer: Self-pay

## 2021-04-22 ENCOUNTER — Other Ambulatory Visit: Payer: Self-pay | Admitting: Student

## 2021-04-22 ENCOUNTER — Encounter (HOSPITAL_COMMUNITY): Payer: Self-pay | Admitting: Nephrology

## 2021-04-22 DIAGNOSIS — I10 Essential (primary) hypertension: Secondary | ICD-10-CM

## 2021-04-23 ENCOUNTER — Other Ambulatory Visit (HOSPITAL_COMMUNITY): Payer: Self-pay

## 2021-04-23 ENCOUNTER — Encounter (HOSPITAL_COMMUNITY): Payer: Self-pay | Admitting: Nephrology

## 2021-04-23 MED ORDER — AMLODIPINE BESYLATE 10 MG PO TABS
10.0000 mg | ORAL_TABLET | Freq: Every day | ORAL | 3 refills | Status: DC
Start: 2021-04-23 — End: 2021-09-14
  Filled 2021-04-23 – 2021-05-01 (×2): qty 30, 30d supply, fill #0
  Filled 2021-05-29: qty 30, 30d supply, fill #1
  Filled 2021-07-03: qty 30, 30d supply, fill #2
  Filled 2021-08-07: qty 30, 30d supply, fill #3

## 2021-04-23 NOTE — Telephone Encounter (Signed)
Next appt scheduled 05/14/21 with Dr Marva Panda.

## 2021-05-01 ENCOUNTER — Other Ambulatory Visit (HOSPITAL_COMMUNITY): Payer: Self-pay

## 2021-05-01 ENCOUNTER — Encounter (HOSPITAL_COMMUNITY): Payer: Self-pay | Admitting: Nephrology

## 2021-05-01 MED FILL — Hydralazine HCl Tab 100 MG: ORAL | 30 days supply | Qty: 90 | Fill #3 | Status: AC

## 2021-05-14 ENCOUNTER — Encounter: Payer: Self-pay | Admitting: Internal Medicine

## 2021-05-20 ENCOUNTER — Ambulatory Visit: Payer: Self-pay | Admitting: Student

## 2021-05-20 ENCOUNTER — Encounter: Payer: Self-pay | Admitting: Student

## 2021-05-20 ENCOUNTER — Encounter (HOSPITAL_COMMUNITY): Payer: Self-pay | Admitting: Nephrology

## 2021-05-20 ENCOUNTER — Other Ambulatory Visit (HOSPITAL_COMMUNITY): Payer: Self-pay

## 2021-05-20 ENCOUNTER — Other Ambulatory Visit: Payer: Self-pay

## 2021-05-20 VITALS — BP 165/78 | HR 69 | Temp 98.3°F | Wt 213.4 lb

## 2021-05-20 DIAGNOSIS — N186 End stage renal disease: Secondary | ICD-10-CM

## 2021-05-20 DIAGNOSIS — S90211A Contusion of right great toe with damage to nail, initial encounter: Secondary | ICD-10-CM

## 2021-05-20 DIAGNOSIS — Z794 Long term (current) use of insulin: Secondary | ICD-10-CM

## 2021-05-20 DIAGNOSIS — E1169 Type 2 diabetes mellitus with other specified complication: Secondary | ICD-10-CM

## 2021-05-20 DIAGNOSIS — Z992 Dependence on renal dialysis: Secondary | ICD-10-CM

## 2021-05-20 DIAGNOSIS — E1122 Type 2 diabetes mellitus with diabetic chronic kidney disease: Secondary | ICD-10-CM

## 2021-05-20 DIAGNOSIS — I10 Essential (primary) hypertension: Secondary | ICD-10-CM

## 2021-05-20 DIAGNOSIS — S90221A Contusion of right lesser toe(s) with damage to nail, initial encounter: Secondary | ICD-10-CM

## 2021-05-20 LAB — GLUCOSE, CAPILLARY: Glucose-Capillary: 222 mg/dL — ABNORMAL HIGH (ref 70–99)

## 2021-05-20 LAB — POCT GLYCOSYLATED HEMOGLOBIN (HGB A1C): Hemoglobin A1C: 9.2 % — AB (ref 4.0–5.6)

## 2021-05-20 MED ORDER — TRULICITY 3 MG/0.5ML ~~LOC~~ SOAJ
3.0000 mg | SUBCUTANEOUS | 2 refills | Status: DC
  Filled 2021-05-20: qty 2, 28d supply, fill #0

## 2021-05-20 NOTE — Assessment & Plan Note (Signed)
Assessment: Blood pressure of 165/78.  Patient had dialysis session yesterday and will plan to have dialysis tomorrow.  Current regimen of amlodipine 10 mg, Coreg 12.5 twice daily and hydralazine 100 3 times daily.  We will make no adjustments today on his nondialysis day.  Plan: -Continue medications as per above -Continue to follow with [facility] for further antihypertensive adjustments

## 2021-05-20 NOTE — Patient Instructions (Signed)
Gracias, Eric Glenn por permitirnos brindarle atencin hoy. hoy discutimos  He ordenado los siguientes laboratorios para usted:  Estamos aumentando tu dosis de trulicidad. Recoja su nueva receta de la farmacia. Regrese a Copy en 1 meses para repetir el control de diabetes.  Infrmenos cuando sea aprobado para la tarjeta naranja.   Lab Orders         POC Hbg A1C      Pruebas ordenadas hoy:  Referencias ordenadas hoy:  Referral Orders  No referral(s) requested today     He ordenado el siguiente medicamento/cambiado los siguientes medicamentos:  Suspender los siguientes medicamentos: There are no discontinued medications.   Iniciar los siguientes medicamentos: No orders of the defined types were placed in this encounter.    Seguimiento  1      Si tiene [place] pregunta o inquietud, llame a la clnica de medicina interna al [phone].     Sanjuana Letters, D.O. [facility]

## 2021-05-20 NOTE — Assessment & Plan Note (Addendum)
Assessment: Patient with hemoglobin A1c of 9.2 from 8.7.  Current regimen of Lantus 35 units twice daily and Trulicity 1.5 mg weekly.  Patient does not take his blood sugars at home as he has difficult time checking due to his history of blindness.  His family is also not home throughout the day to assist him in checking his sugars.  As such I think he would benefit from a libre freestyle sensor that we will work with his smart phone.  His family is willing to help with the cost of the Avis.  Think this will benefit the patient over the next month to determine if we can adjust his Lantus appropriately.  Patient states he walks daily and tries to get in his as much exercise as possible.  Also discussed healthy eating habits, patient's niece states that the patient frequently will go on a long streak of doing well with eating when he is supposed to and that he gets tired of it.  Discussed that if this is the case he should have periodic breakthrough meals or treats to help her with himself or doing well.  Plan: -Increase Trulicity to 3 mg weekly, continue Lantus 35 units twice daily -Work with family to get patient free libre sensor that we will work with a Stage manager and have audio -Repeat A1c in 3 months  Addendum:  Patient has been out of trulicity for past few months. Will work with pharmacy team to have this prescription refilled. Patient uninsured and patients niece notes this was sent to the house prior.

## 2021-05-20 NOTE — Progress Notes (Addendum)
CC: f/u DM  HPI:  [name] is a 55 y.o. male with a past medical history stated below and presents today for follow up concerning his diabetes mellitus. Please see problem based assessment and plan for additional details.  Translator services were used during this patient's encounter  Past Medical History:  Diagnosis Date   Anemia    Diabetes ([place])    Diabetes mellitus type 2, controlled ([place]) 08/04/2015   Diabetic retinopathy ([place])    Diabetic retinopathy ([place])    Dialysis patient ([place])    Diarrhea    ESRD (end stage renal disease) ([place]) 03/10/2016   High cholesterol    Hypertension    Nausea 08/2017    Current Outpatient Medications on File Prior to Visit  Medication Sig Dispense Refill   acetaminophen (TYLENOL) 325 MG tablet Take 325-650 mg by mouth every 6 (six) hours as needed for mild pain or headache.     albuterol (VENTOLIN HFA) 108 (90 Base) MCG/ACT inhaler Inhale 1-2 puffs into the lungs every 6 (six) hours as needed for wheezing or shortness of breath. 18 g 0   amLODipine (NORVASC) 10 MG tablet Take 1 tablet (10 mg total) by mouth daily. 30 tablet 3   atorvastatin (LIPITOR) 40 MG tablet TAKE 1 TABLET (40 MG TOTAL) BY MOUTH DAILY. 30 tablet 3   carvedilol (COREG) 12.5 MG tablet Take 1 tablet (12.5 mg total) by mouth 2 (two) times daily. 60 tablet 3   ciprofloxacin-dexamethasone (CIPRODEX) OTIC suspension Place 5 drops into the right ear 2 (two) times daily. 7.5 mL 0   glucose blood (CONTOUR NEXT TEST) test strip Test blood glucose 4 times daily 100 each 11   hydrALAZINE (APRESOLINE) 100 MG tablet TAKE 1 TABLET (100 MG TOTAL) BY MOUTH 3 (THREE) TIMES DAILY. 90 tablet 10   insulin glargine (LANTUS SOLOSTAR) 100 UNIT/ML Solostar Pen Inject 35 Units into the skin 2 (two) times daily. 21 mL 6   Insulin Pen Needle 32G X 4 MM MISC USE AS DIRECTED TWICE DAILY. 100 each 12   iron sucrose in sodium chloride 0.9 % 100 mL Given at dialysis     Lancets MISC Test  blood glucose 4 times daily 100 each 11   levofloxacin (LEVAQUIN) 250 MG tablet Take 2 tablets (500 mg) on day #1, then half a tablet every 24 hours for 7 days total 5 tablet 0   methocarbamol (ROBAXIN) 500 MG tablet Take 1 tablet (500 mg total) by mouth 2 (two) times daily. 10 tablet 0   multivitamin (RENA-VIT) TABS tablet Take 1 tablet by mouth daily. 60 tablet 5   ofloxacin (OCUFLOX) 0.3 % ophthalmic solution Place 1 drop into the left eye 4 (four) times daily. Start 1 day before surgery 5 mL 0   Omega-3 Fatty Acids (FISH OIL) 1000 MG CAPS Take 1,000 mg by mouth in the morning and at bedtime.     senna-docusate (SENOKOT S) 8.6-50 MG tablet Take 1 tablet by mouth daily. 30 tablet 0   No current facility-administered medications on file prior to visit.    Family History  Problem Relation Age of Onset   Hypertension Mother    Hyperlipidemia Mother    Diabetes Mellitus II Sister     Social History   Socioeconomic History   Marital status: Single    Spouse name: Not on file   Number of children: Not on file   Years of education: Not on file   Highest education level: Not on file  Occupational History   Not on file  Tobacco Use   Smoking status: Former    Types: Cigarettes    Quit date: 10/22/2003    Years since quitting: 17.5   Smokeless tobacco: Never  Vaping Use   Vaping Use: Never used  Substance and Sexual Activity   Alcohol use: No    Alcohol/week: 0.0 standard drinks   Drug use: No   Sexual activity: Not on file  Other Topics Concern   Not on file  Social History Narrative   Not on file   Social Determinants of Health   Financial Resource Strain: Not on file  Food Insecurity: Not on file  Transportation Needs: Not on file  Physical Activity: Not on file  Stress: Not on file  Social Connections: Not on file  Intimate Partner Violence: Not on file    Review of Systems: ROS negative except for what is noted on the assessment and plan.  Vitals:   05/20/21  1348  BP: (!) 165/78  Pulse: 69  Temp: 98.3 F (36.8 C)  TempSrc: Oral  SpO2: 99%  Weight: 213 lb 6.4 oz (96.8 kg)     Physical Exam: Constitutional: No acute distress HENT: normocephalic atraumatic Neck: supple Cardiovascular: regular rate Pulmonary/Chest: normal work of breathing on room air MSK: normal bulk and tone.  Right arm fistula with palpable thrill.  No tenderness or erythema.  Discoloration of the nailbed of right big toe. Neurological: alert & oriented x 3, sensation unremarkable of lower extremities Skin: warm and dry Psych: Normal mood and thought process   Assessment & Plan:   See Encounters Tab for problem based charting.  Patient discussed with Dr. Fanny Bien, D.O. [facility], PGY-2 Pager: [phone], Phone: [phone] Date 05/20/2021 Time 5:30 PM

## 2021-05-22 DIAGNOSIS — S90221A Contusion of right lesser toe(s) with damage to nail, initial encounter: Secondary | ICD-10-CM | POA: Insufficient documentation

## 2021-05-22 DIAGNOSIS — S90211A Contusion of right great toe with damage to nail, initial encounter: Secondary | ICD-10-CM | POA: Insufficient documentation

## 2021-05-22 NOTE — Assessment & Plan Note (Signed)
Assessment: Patient with discoloration of right   Plan: -

## 2021-05-22 NOTE — Assessment & Plan Note (Signed)
Assessment: Patient with history of onychomycosis of the right big toe.  On examination today, patient's niece is concerned that he has some discoloration of the toe nail.  Patient denies any trauma to the toe, also is uncertain as to how long the toenail has been discolored.  However, patient does have numbness to right big toe which I believe is secondary to his uncontrolled diabetes.  He denies any pain to the area when palpated.  I suspect that this is secondary to a subungual hematoma.  We will have patient continue to monitor, if no improvement in the following weeks can consider sending for biopsy.  Can also consider seeing if able to scrape dry blood from under the nail bed.  Low suspicion for subungual melanoma at this time.  Plan: -Continue to monitor -If still present in the coming months can consider biopsy and/or removal of nail bed.

## 2021-05-25 NOTE — Progress Notes (Signed)
Internal Medicine Clinic Attending  Case discussed with Dr. Katsadouros  At the time of the visit.  We reviewed the resident's history and exam and pertinent patient test results.  I agree with the assessment, diagnosis, and plan of care documented in the resident's note.  

## 2021-05-26 ENCOUNTER — Telehealth: Payer: Self-pay

## 2021-05-26 NOTE — Telephone Encounter (Signed)
Attempted to contact pt's niece in regards to patient assistance for Trulicity medication. No voicemail available.

## 2021-05-27 MED ORDER — FREESTYLE LIBRE 2 SENSOR MISC: 1.0000 | 11 refills | Status: DC

## 2021-05-27 NOTE — Addendum Note (Signed)
Addended by: Riesa Pope on: 05/27/2021 02:20 PM   Modules accepted: Orders

## 2021-05-28 ENCOUNTER — Telehealth: Payer: Self-pay

## 2021-05-28 NOTE — Telephone Encounter (Signed)
Pt's niece called back with pt on the line.   Discussed patient assistance for trulicity. Pt's niece Eric Glenn will come to office to sign paperwork.

## 2021-05-29 ENCOUNTER — Encounter (HOSPITAL_COMMUNITY): Payer: Self-pay | Admitting: Nephrology

## 2021-05-29 ENCOUNTER — Other Ambulatory Visit (HOSPITAL_COMMUNITY): Payer: Self-pay

## 2021-05-29 MED FILL — Hydralazine HCl Tab 100 MG: ORAL | 30 days supply | Qty: 90 | Fill #4 | Status: AC

## 2021-06-19 ENCOUNTER — Encounter: Payer: Self-pay | Admitting: Student

## 2021-07-03 ENCOUNTER — Other Ambulatory Visit (HOSPITAL_COMMUNITY): Payer: Self-pay

## 2021-07-03 ENCOUNTER — Encounter (HOSPITAL_COMMUNITY): Payer: Self-pay | Admitting: Nephrology

## 2021-07-03 ENCOUNTER — Other Ambulatory Visit: Payer: Self-pay | Admitting: Student

## 2021-07-03 DIAGNOSIS — I10 Essential (primary) hypertension: Secondary | ICD-10-CM

## 2021-07-05 MED ORDER — CARVEDILOL 12.5 MG PO TABS
12.5000 mg | ORAL_TABLET | Freq: Two times a day (BID) | ORAL | 3 refills | Status: DC
  Filled 2021-07-05: qty 60, 30d supply, fill #0
  Filled 2021-08-07: qty 60, 30d supply, fill #1
  Filled 2021-09-14: qty 60, 30d supply, fill #2
  Filled 2021-10-15: qty 60, 30d supply, fill #3

## 2021-07-06 ENCOUNTER — Other Ambulatory Visit (HOSPITAL_COMMUNITY): Payer: Self-pay

## 2021-07-06 ENCOUNTER — Encounter (HOSPITAL_COMMUNITY): Payer: Self-pay | Admitting: Nephrology

## 2021-07-15 NOTE — Telephone Encounter (Signed)
Paperwork never signed. Shredded app.

## 2021-07-30 NOTE — Progress Notes (Deleted)
Office Note     CC:  ESRD Requesting Provider:  Dorethea Clan, DO  HPI: Eric Glenn is a {Handed:22697} handed 56 y.o. (Jul 12, 1966) male with kidney disease who presents at the request of Dr. Pearson Grippe, nephrology for ulceration of his fistula.  Patient had right brachiocephalic AV fistula creation in July 2017 with Dr. Doren Custard.  Since that time, it has been functioning well. ***   On exam, ***  The pt is *** on a statin for cholesterol management.  The pt is *** on a daily aspirin.   Other AC:  *** The pt is *** on medications for hypertension.   The pt is *** diabetic. Tobacco hx:  ***  Past Medical History:  Diagnosis Date   Anemia    Diabetes ([place])    Diabetes mellitus type 2, controlled ([place]) 08/04/2015   Diabetic retinopathy ([place])    Diabetic retinopathy ([place])    Dialysis patient ([place])    Diarrhea    ESRD (end stage renal disease) ([place]) 03/10/2016   High cholesterol    Hypertension    Nausea 08/2017    Past Surgical History:  Procedure Laterality Date   AV FISTULA PLACEMENT Right 09/04/2015   Procedure: ARTERIOVENOUS (AV) FISTULA CREATION;  Surgeon: Angelia Mould, MD;  Location: [facility];  Service: Vascular;  Laterality: Right;   AV FISTULA PLACEMENT Right 01/22/2016   Procedure: RIGHT UPPER ARM BRACHIOCEPHALIC ARTERIOVENOUS (AV) FISTULA CREATION;  Surgeon: Angelia Mould, MD;  Location: [facility];  Service: Vascular;  Laterality: Right;   IR DIALY SHUNT INTRO [place] W/IMG RIGHT Right 12/08/2018   IR DIALY SHUNT INTRO [place] W/IMG RIGHT Right 08/18/2020   LIGATION OF ARTERIOVENOUS  FISTULA Right 01/22/2016   Procedure: LIGATION OF RIGHT FOREARM ARTERIOVENOUS  FISTULA;  Surgeon: Angelia Mould, MD;  Location: [facility];  Service: Vascular;  Laterality: Right;   PERIPHERAL VASCULAR CATHETERIZATION N/A 01/12/2016   Procedure: Fistulagram;  Surgeon: Angelia Mould, MD;  Location: [facility];  Service:  Cardiovascular;  Laterality: N/A;   UMBILICAL HERNIA REPAIR      Social History   Socioeconomic History   Marital status: Single    Spouse name: Not on file   Number of children: Not on file   Years of education: Not on file   Highest education level: Not on file  Occupational History   Not on file  Tobacco Use   Smoking status: Former    Types: Cigarettes    Quit date: 10/22/2003    Years since quitting: 17.7   Smokeless tobacco: Never  Vaping Use   Vaping Use: Never used  Substance and Sexual Activity   Alcohol use: No    Alcohol/week: 0.0 standard drinks   Drug use: No   Sexual activity: Not on file  Other Topics Concern   Not on file  Social History Narrative   Not on file   Social Determinants of Health   Financial Resource Strain: Not on file  Food Insecurity: Not on file  Transportation Needs: Not on file  Physical Activity: Not on file  Stress: Not on file  Social Connections: Not on file  Intimate Partner Violence: Not on file   *** Family History  Problem Relation Age of Onset   Hypertension Mother    Hyperlipidemia Mother    Diabetes Mellitus II Sister     Current Outpatient Medications  Medication Sig Dispense Refill   acetaminophen (TYLENOL) 325 MG tablet Take 325-650 mg by  mouth every 6 (six) hours as needed for mild pain or headache.     albuterol (VENTOLIN HFA) 108 (90 Base) MCG/ACT inhaler Inhale 1-2 puffs into the lungs every 6 (six) hours as needed for wheezing or shortness of breath. 18 g 0   amLODipine (NORVASC) 10 MG tablet Take 1 tablet (10 mg total) by mouth daily. 30 tablet 3   atorvastatin (LIPITOR) 40 MG tablet TAKE 1 TABLET (40 MG TOTAL) BY MOUTH DAILY. 30 tablet 3   carvedilol (COREG) 12.5 MG tablet Take 1 tablet (12.5 mg total) by mouth 2 (two) times daily. 60 tablet 3   ciprofloxacin-dexamethasone (CIPRODEX) OTIC suspension Place 5 drops into the right ear 2 (two) times daily. 7.5 mL 0   Continuous Blood Gluc Sensor (FREESTYLE  LIBRE 2 SENSOR) MISC 1 patch by Does not apply route every 14 (fourteen) days. 2 each 11   Dulaglutide (TRULICITY) 3 BO/1.7PZ SOPN Inject 3 mg as directed once a week. 2 mL 2   glucose blood (CONTOUR NEXT TEST) test strip Test blood glucose 4 times daily 100 each 11   hydrALAZINE (APRESOLINE) 100 MG tablet TAKE 1 TABLET (100 MG TOTAL) BY MOUTH 3 (THREE) TIMES DAILY. 90 tablet 10   insulin glargine (LANTUS SOLOSTAR) 100 UNIT/ML Solostar Pen Inject 35 Units into the skin 2 (two) times daily. 21 mL 6   Insulin Pen Needle 32G X 4 MM MISC USE AS DIRECTED TWICE DAILY. 100 each 12   iron sucrose in sodium chloride 0.9 % 100 mL Given at dialysis     Lancets MISC Test blood glucose 4 times daily 100 each 11   levofloxacin (LEVAQUIN) 250 MG tablet Take 2 tablets (500 mg) on day #1, then half a tablet every 24 hours for 7 days total 5 tablet 0   methocarbamol (ROBAXIN) 500 MG tablet Take 1 tablet (500 mg total) by mouth 2 (two) times daily. 10 tablet 0   multivitamin (RENA-VIT) TABS tablet Take 1 tablet by mouth daily. 60 tablet 5   ofloxacin (OCUFLOX) 0.3 % ophthalmic solution Place 1 drop into the left eye 4 (four) times daily. Start 1 day before surgery 5 mL 0   Omega-3 Fatty Acids (FISH OIL) 1000 MG CAPS Take 1,000 mg by mouth in the morning and at bedtime.     senna-docusate (SENOKOT S) 8.6-50 MG tablet Take 1 tablet by mouth daily. 30 tablet 0   No current facility-administered medications for this visit.    Allergies  Allergen Reactions   Other Other (See Comments) and Rash    Raises blood pressure   Poractant Alfa Rash and Other (See Comments)    Raises blood pressure   Pork-Derived Products Rash and Other (See Comments)    Raises blood pressure   Penicillins Hives     REVIEW OF SYSTEMS:  *** [X]  denotes positive finding, [ ]  denotes negative finding Cardiac  Comments:  Chest pain or chest pressure:    Shortness of breath upon exertion:    Short of breath when lying flat:     Irregular heart rhythm:        Vascular    Pain in calf, thigh, or hip brought on by ambulation:    Pain in feet at night that wakes you up from your sleep:     Blood clot in your veins:    Leg swelling:         Pulmonary    Oxygen at home:    Productive cough:  Wheezing:         Neurologic    Sudden weakness in arms or legs:     Sudden numbness in arms or legs:     Sudden onset of difficulty speaking or slurred speech:    Temporary loss of vision in one eye:     Problems with dizziness:         Gastrointestinal    Blood in stool:     Vomited blood:         Genitourinary    Burning when urinating:     Blood in urine:        Psychiatric    Major depression:         Hematologic    Bleeding problems:    Problems with blood clotting too easily:        Skin    Rashes or ulcers:        Constitutional    Fever or chills:      PHYSICAL EXAMINATION:  There were no vitals filed for this visit.  General:  WDWN in NAD; vital signs documented above Gait: Not observed HENT: WNL, normocephalic Pulmonary: normal non-labored breathing , without Rales, rhonchi,  wheezing Cardiac: {Desc; regular/irreg:14544} HR, without  Murmurs {With/Without:20273} carotid bruit*** Abdomen: soft, NT, no masses Skin: {With/Without:20273} rashes Vascular Exam/Pulses:  Right Left  Radial {Exam; arterial pulse strength 0-4:30167} {Exam; arterial pulse strength 0-4:30167}  Ulnar {Exam; arterial pulse strength 0-4:30167} {Exam; arterial pulse strength 0-4:30167}  Femoral {Exam; arterial pulse strength 0-4:30167} {Exam; arterial pulse strength 0-4:30167}  Popliteal {Exam; arterial pulse strength 0-4:30167} {Exam; arterial pulse strength 0-4:30167}  DP {Exam; arterial pulse strength 0-4:30167} {Exam; arterial pulse strength 0-4:30167}  PT {Exam; arterial pulse strength 0-4:30167} {Exam; arterial pulse strength 0-4:30167}   Extremities: {With/Without:20273} ischemic changes,  {With/Without:20273} Gangrene , {With/Without:20273} cellulitis; {With/Without:20273} open wounds;  Musculoskeletal: no muscle wasting or atrophy  Neurologic: A&O X 3;  No focal weakness or paresthesias are detected Psychiatric:  The pt has {Desc; normal/abnormal:11317::"Normal"} affect.   Non-Invasive Vascular Imaging:   ***    ASSESSMENT/PLAN:  Eric Glenn is a 56 y.o. male who presents with {KidneyDisease:19197::"end stage renal disease","chronic kidney disease stage ***"}  Based on vein mapping and examination, ***. I had an extensive discussion with this patient in regards to the nature of access surgery, including risk, benefits, and alternatives.   The patient is aware that the risks of access surgery include but are not limited to: bleeding, infection, steal syndrome, nerve damage, ischemic monomelic neuropathy, failure of access to mature, complications related to venous hypertension, and possible need for additional access procedures in the future. *** I discussed with the patient the nature of the staged access procedure, specifically the need for a second operation to transpose the first stage fistula if it matures adequately.   The patient has *** agreed to proceed with the above procedure which will be scheduled ***.  Broadus John, MD [facility] [phone]

## 2021-07-31 ENCOUNTER — Encounter: Payer: Self-pay | Admitting: Vascular Surgery

## 2021-08-07 ENCOUNTER — Other Ambulatory Visit: Payer: Self-pay

## 2021-08-07 ENCOUNTER — Other Ambulatory Visit: Payer: Self-pay | Admitting: Student

## 2021-08-07 ENCOUNTER — Encounter: Payer: Self-pay | Admitting: Vascular Surgery

## 2021-08-07 ENCOUNTER — Ambulatory Visit (INDEPENDENT_AMBULATORY_CARE_PROVIDER_SITE_OTHER): Payer: Self-pay | Admitting: Vascular Surgery

## 2021-08-07 ENCOUNTER — Encounter (HOSPITAL_COMMUNITY): Payer: Self-pay | Admitting: Nephrology

## 2021-08-07 ENCOUNTER — Other Ambulatory Visit (HOSPITAL_COMMUNITY): Payer: Self-pay

## 2021-08-07 VITALS — BP 165/80 | HR 60 | Temp 98.2°F | Resp 20 | Ht 65.0 in | Wt 213.0 lb

## 2021-08-07 DIAGNOSIS — N186 End stage renal disease: Secondary | ICD-10-CM

## 2021-08-07 DIAGNOSIS — Z992 Dependence on renal dialysis: Secondary | ICD-10-CM

## 2021-08-07 DIAGNOSIS — I10 Essential (primary) hypertension: Secondary | ICD-10-CM

## 2021-08-07 NOTE — Progress Notes (Signed)
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 (Apr 16, 1966) male with kidney disease who presents at the request of Dr. Pearson Grippe, nephrology for ulceration of his fistula.  Patient had right brachiocephalic AV fistula creation in July 2017 with Dr. Doren Custard.  Since that time, it has been functioning well.   On exam today, has a was doing well.  He is Spanish-speaking only, and therefore the interpretation was done by his niece.  A medical interpreter was offered, however he declined.  He was not aware of why he was being seen today.  He denied nonhealing ulcerations on his fistula, but has appreciated prolonged bleeding after dialysis with increased pulsatility per dialysis nurses.  He has no pain at his fistula, and denies symptoms of steal syndrome.  The pt is  on a statin for cholesterol management.  The pt is - on a daily aspirin.   Other AC:  - The pt is  on medications for hypertension.   The pt is  diabetic. Tobacco hx:  former  Past Medical History:  Diagnosis Date   Anemia    Diabetes ([place])    Diabetes mellitus type 2, controlled ([place]) 08/04/2015   Diabetic retinopathy ([place])    Diabetic retinopathy ([place])    Dialysis patient ([place])    Diarrhea    ESRD (end stage renal disease) ([place]) 03/10/2016   High cholesterol    Hypertension    Nausea 08/2017    Past Surgical History:  Procedure Laterality Date   AV FISTULA PLACEMENT Right 09/04/2015   Procedure: ARTERIOVENOUS (AV) FISTULA CREATION;  Surgeon: Angelia Mould, MD;  Location: [facility];  Service: Vascular;  Laterality: Right;   AV FISTULA PLACEMENT Right 01/22/2016   Procedure: RIGHT UPPER ARM BRACHIOCEPHALIC ARTERIOVENOUS (AV) FISTULA CREATION;  Surgeon: Angelia Mould, MD;  Location: [facility];  Service: Vascular;  Laterality: Right;   IR DIALY SHUNT INTRO [place] W/IMG RIGHT Right 12/08/2018   IR DIALY SHUNT INTRO  [place] W/IMG RIGHT Right 08/18/2020   LIGATION OF ARTERIOVENOUS  FISTULA Right 01/22/2016   Procedure: LIGATION OF RIGHT FOREARM ARTERIOVENOUS  FISTULA;  Surgeon: Angelia Mould, MD;  Location: [facility];  Service: Vascular;  Laterality: Right;   PERIPHERAL VASCULAR CATHETERIZATION N/A 01/12/2016   Procedure: Fistulagram;  Surgeon: Angelia Mould, MD;  Location: [facility];  Service: Cardiovascular;  Laterality: N/A;   UMBILICAL HERNIA REPAIR      Social History   Socioeconomic History   Marital status: Single    Spouse name: Not on file   Number of children: Not on file   Years of education: Not on file   Highest education level: Not on file  Occupational History   Not on file  Tobacco Use   Smoking status: Former    Types: Cigarettes    Quit date: 10/22/2003    Years since quitting: 17.8   Smokeless tobacco: Never  Vaping Use   Vaping Use: Never used  Substance and Sexual Activity   Alcohol use: No    Alcohol/week: 0.0 standard drinks   Drug use: No   Sexual activity: Not on file  Other Topics Concern   Not on file  Social History Narrative   Not on file   Social Determinants of Health   Financial Resource Strain: Not on file  Food Insecurity: Not on file  Transportation Needs: Not on  file  Physical Activity: Not on file  Stress: Not on file  Social Connections: Not on file  Intimate Partner Violence: Not on file    Family History  Problem Relation Age of Onset   Hypertension Mother    Hyperlipidemia Mother    Diabetes Mellitus II Sister     Current Outpatient Medications  Medication Sig Dispense Refill   acetaminophen (TYLENOL) 325 MG tablet Take 325-650 mg by mouth every 6 (six) hours as needed for mild pain or headache.     albuterol (VENTOLIN HFA) 108 (90 Base) MCG/ACT inhaler Inhale 1-2 puffs into the lungs every 6 (six) hours as needed for wheezing or shortness of breath. 18 g 0   amLODipine (NORVASC) 10 MG tablet Take 1  tablet (10 mg total) by mouth daily. 30 tablet 3   carvedilol (COREG) 12.5 MG tablet Take 1 tablet (12.5 mg total) by mouth 2 (two) times daily. 60 tablet 3   ciprofloxacin-dexamethasone (CIPRODEX) OTIC suspension Place 5 drops into the right ear 2 (two) times daily. 7.5 mL 0   Continuous Blood Gluc Sensor (FREESTYLE LIBRE 2 SENSOR) MISC 1 patch by Does not apply route every 14 (fourteen) days. 2 each 11   Dulaglutide (TRULICITY) 3 ZW/2.5EN SOPN Inject 3 mg as directed once a week. 2 mL 2   glucose blood (CONTOUR NEXT TEST) test strip Test blood glucose 4 times daily 100 each 11   insulin glargine (LANTUS SOLOSTAR) 100 UNIT/ML Solostar Pen Inject 35 Units into the skin 2 (two) times daily. 21 mL 6   iron sucrose in sodium chloride 0.9 % 100 mL Given at dialysis     Lancets MISC Test blood glucose 4 times daily 100 each 11   levofloxacin (LEVAQUIN) 250 MG tablet Take 2 tablets (500 mg) on day #1, then half a tablet every 24 hours for 7 days total 5 tablet 0   methocarbamol (ROBAXIN) 500 MG tablet Take 1 tablet (500 mg total) by mouth 2 (two) times daily. 10 tablet 0   multivitamin (RENA-VIT) TABS tablet Take 1 tablet by mouth daily. 60 tablet 5   ofloxacin (OCUFLOX) 0.3 % ophthalmic solution Place 1 drop into the left eye 4 (four) times daily. Start 1 day before surgery 5 mL 0   Omega-3 Fatty Acids (FISH OIL) 1000 MG CAPS Take 1,000 mg by mouth in the morning and at bedtime.     senna-docusate (SENOKOT S) 8.6-50 MG tablet Take 1 tablet by mouth daily. 30 tablet 0   atorvastatin (LIPITOR) 40 MG tablet TAKE 1 TABLET (40 MG TOTAL) BY MOUTH DAILY. 30 tablet 3   hydrALAZINE (APRESOLINE) 100 MG tablet TAKE 1 TABLET (100 MG TOTAL) BY MOUTH 3 (THREE) TIMES DAILY. 90 tablet 10   Insulin Pen Needle 32G X 4 MM MISC USE AS DIRECTED TWICE DAILY. 100 each 12   No current facility-administered medications for this visit.    Allergies  Allergen Reactions   Other Other (See Comments) and Rash    Raises blood  pressure   Poractant Alfa Rash and Other (See Comments)    Raises blood pressure   Pork-Derived Products Rash and Other (See Comments)    Raises blood pressure   Penicillins Hives     REVIEW OF SYSTEMS:   [X]  denotes positive finding, [ ]  denotes negative finding Cardiac  Comments:  Chest pain or chest pressure:    Shortness of breath upon exertion:    Short of breath when lying flat:    Irregular  heart rhythm:        Vascular    Pain in calf, thigh, or hip brought on by ambulation:    Pain in feet at night that wakes you up from your sleep:     Blood clot in your veins:    Leg swelling:         Pulmonary    Oxygen at home:    Productive cough:     Wheezing:         Neurologic    Sudden weakness in arms or legs:     Sudden numbness in arms or legs:     Sudden onset of difficulty speaking or slurred speech:    Temporary loss of vision in one eye:     Problems with dizziness:         Gastrointestinal    Blood in stool:     Vomited blood:         Genitourinary    Burning when urinating:     Blood in urine:        Psychiatric    Major depression:         Hematologic    Bleeding problems:    Problems with blood clotting too easily:        Skin    Rashes or ulcers:        Constitutional    Fever or chills:      PHYSICAL EXAMINATION:  Vitals:   08/07/21 0910  BP: (!) 165/80  Pulse: 60  Resp: 20  Temp: 98.2 F (36.8 C)  SpO2: 94%  Weight: 213 lb (96.6 kg)  Height: 5\' 5"  (1.651 m)    General:  WDWN in NAD; vital signs documented above Gait: Not observed HENT: WNL, normocephalic Pulmonary: normal non-labored breathing  Cardiac: regular HR Abdomen: soft, NT, no masses Skin: without rashes Vascular Exam/Pulses:  Right Left  Radial 2+ (normal) 2+ (normal)  Ulnar 2+ (normal) 2+ (normal)                   Extremities: without ischemic changes, without Gangrene , without cellulitis; without open wounds;  Musculoskeletal: no muscle wasting or  atrophy  Neurologic: A&O X 3;  No focal weakness or paresthesias are detected Psychiatric:  The pt has Normal affect.   Non-Invasive Vascular Imaging:   -    ASSESSMENT/PLAN:  Eric Glenn is a 56 y.o. 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  There are no open ulcerations.  Healthy dermis between the fistula and the skin.  At dialysis, [facility] and nurses have appreciated prolonged bleeding with decannulation.  There is concern for central stenosis, and therefore I offered right upper extremity fistulogram.  After discussing the risk and benefits, Eric Glenn elected to proceed.  Eric John, MD [facility] [phone]

## 2021-08-09 MED ORDER — HYDRALAZINE HCL 100 MG PO TABS
100.0000 mg | ORAL_TABLET | Freq: Three times a day (TID) | ORAL | 3 refills | Status: DC
  Filled 2021-08-09: qty 90, 30d supply, fill #0
  Filled 2021-09-14: qty 90, 30d supply, fill #1
  Filled 2021-10-15: qty 90, 30d supply, fill #2
  Filled 2021-11-18: qty 90, 30d supply, fill #3

## 2021-08-10 ENCOUNTER — Encounter (HOSPITAL_COMMUNITY): Payer: Self-pay | Admitting: Nephrology

## 2021-08-10 ENCOUNTER — Other Ambulatory Visit (HOSPITAL_COMMUNITY): Payer: Self-pay

## 2021-08-12 ENCOUNTER — Ambulatory Visit (HOSPITAL_COMMUNITY)
Admission: RE | Disposition: A | Discharge status: Home / Self Care | Payer: Self-pay | Source: Home / Self Care | Attending: Vascular Surgery

## 2021-08-12 ENCOUNTER — Ambulatory Visit (HOSPITAL_COMMUNITY)
Admission: RE | Admit: 2021-08-12 | Discharge: 2021-08-12 | Disposition: A | Discharge status: Home / Self Care | Payer: Self-pay | Attending: Vascular Surgery | Admitting: Vascular Surgery

## 2021-08-12 ENCOUNTER — Other Ambulatory Visit: Payer: Self-pay

## 2021-08-12 DIAGNOSIS — I120 Hypertensive chronic kidney disease with stage 5 chronic kidney disease or end stage renal disease: Secondary | ICD-10-CM | POA: Insufficient documentation

## 2021-08-12 DIAGNOSIS — E1122 Type 2 diabetes mellitus with diabetic chronic kidney disease: Secondary | ICD-10-CM | POA: Insufficient documentation

## 2021-08-12 DIAGNOSIS — Z7982 Long term (current) use of aspirin: Secondary | ICD-10-CM | POA: Insufficient documentation

## 2021-08-12 DIAGNOSIS — I721 Aneurysm of artery of upper extremity: Secondary | ICD-10-CM | POA: Insufficient documentation

## 2021-08-12 DIAGNOSIS — N186 End stage renal disease: Secondary | ICD-10-CM | POA: Insufficient documentation

## 2021-08-12 DIAGNOSIS — T82858A Stenosis of vascular prosthetic devices, implants and grafts, initial encounter: Secondary | ICD-10-CM | POA: Insufficient documentation

## 2021-08-12 DIAGNOSIS — H547 Unspecified visual loss: Secondary | ICD-10-CM | POA: Insufficient documentation

## 2021-08-12 DIAGNOSIS — Z992 Dependence on renal dialysis: Secondary | ICD-10-CM | POA: Insufficient documentation

## 2021-08-12 DIAGNOSIS — Z87891 Personal history of nicotine dependence: Secondary | ICD-10-CM | POA: Insufficient documentation

## 2021-08-12 DIAGNOSIS — Y832 Surgical operation with anastomosis, bypass or graft as the cause of abnormal reaction of the patient, or of later complication, without mention of misadventure at the time of the procedure: Secondary | ICD-10-CM | POA: Insufficient documentation

## 2021-08-12 HISTORY — PX: PERIPHERAL VASCULAR BALLOON ANGIOPLASTY: CATH118281

## 2021-08-12 HISTORY — PX: A/V FISTULAGRAM: CATH118298

## 2021-08-12 LAB — POCT I-STAT, CHEM 8
BUN: 57 mg/dL — ABNORMAL HIGH (ref 6–20)
Calcium, Ion: 1.01 mmol/L — ABNORMAL LOW (ref 1.15–1.40)
Chloride: 95 mmol/L — ABNORMAL LOW (ref 98–111)
Creatinine, Ser: 8.1 mg/dL — ABNORMAL HIGH (ref 0.61–1.24)
Glucose, Bld: 243 mg/dL — ABNORMAL HIGH (ref 70–99)
HCT: 41 % (ref 39.0–52.0)
Hemoglobin: 13.9 g/dL (ref 13.0–17.0)
Potassium: 4.5 mmol/L (ref 3.5–5.1)
Sodium: 133 mmol/L — ABNORMAL LOW (ref 135–145)
TCO2: 31 mmol/L (ref 22–32)

## 2021-08-12 SURGERY — A/V FISTULAGRAM
Anesthesia: LOCAL | Laterality: Right

## 2021-08-12 MED ORDER — SODIUM CHLORIDE 0.9% FLUSH: 3.0000 mL | INTRAVENOUS | Status: DC | PRN

## 2021-08-12 MED ORDER — LIDOCAINE HCL (PF) 1 % IJ SOLN
INTRAMUSCULAR | Status: AC
  Filled 2021-08-12: qty 30

## 2021-08-12 MED ORDER — LIDOCAINE HCL (PF) 1 % IJ SOLN
INTRAMUSCULAR | Status: DC | PRN
  Administered 2021-08-12: 5 mL

## 2021-08-12 MED ORDER — SODIUM CHLORIDE 0.9% FLUSH: 3.0000 mL | Freq: Two times a day (BID) | INTRAVENOUS | Status: DC

## 2021-08-12 MED ORDER — IODIXANOL 320 MG/ML IV SOLN
INTRAVENOUS | Status: DC | PRN
  Administered 2021-08-12: 13:00:00 70 mL

## 2021-08-12 MED ORDER — SODIUM CHLORIDE 0.9 % IV SOLN: 250.0000 mL | INTRAVENOUS | Status: DC | PRN

## 2021-08-12 MED ORDER — HEPARIN (PORCINE) IN NACL 1000-0.9 UT/500ML-% IV SOLN
INTRAVENOUS | Status: AC
  Filled 2021-08-12: qty 500

## 2021-08-12 SURGICAL SUPPLY — 16 items
BAG SNAP BAND KOVER 36X36 (MISCELLANEOUS) ×2 IMPLANT
BALLN MUSTANG 10.0X40 75 (BALLOONS) ×2
BALLN MUSTANG 10X20X75 (BALLOONS) ×2
BALLOON MUSTANG 10.0X40 75 (BALLOONS) IMPLANT
BALLOON MUSTANG 10X20X75 (BALLOONS) IMPLANT
COVER DOME SNAP 22 D (MISCELLANEOUS) ×2 IMPLANT
GLIDEWIRE ADV .035X260CM (WIRE) ×1 IMPLANT
KIT ENCORE 26 ADVANTAGE (KITS) ×1 IMPLANT
KIT MICROPUNCTURE NIT STIFF (SHEATH) ×1 IMPLANT
PROTECTION STATION PRESSURIZED (MISCELLANEOUS) ×2
SHEATH PINNACLE R/O II 6F 4CM (SHEATH) ×1 IMPLANT
SHEATH PROBE COVER 6X72 (BAG) ×3 IMPLANT
STATION PROTECTION PRESSURIZED (MISCELLANEOUS) ×1 IMPLANT
STOPCOCK MORSE 400PSI 3WAY (MISCELLANEOUS) ×2 IMPLANT
TRAY PV CATH (CUSTOM PROCEDURE TRAY) ×2 IMPLANT
TUBING CIL FLEX 10 FLL-RA (TUBING) ×2 IMPLANT

## 2021-08-12 NOTE — H&P (Signed)
Office Note   Patient seen and examined in preop holding.  No complaints. No changes to medication history or physical exam since last seen in clinic. After discussing the risks and benefits of fistulagram, Eric Glenn elected to proceed.   An interpreted was used for the interaction above  Eric John MD   CC:  ESRD Requesting Provider:  No ref. 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 (24-Nov-1965) male with kidney disease who presents at the request of Eric Glenn, nephrology for ulceration of his fistula.  Patient had right brachiocephalic AV fistula creation in July 2017 with Dr. Doren Glenn.  Since that time, it has been functioning well.   On exam today, has a was doing well.  He is Spanish-speaking only, and therefore the interpretation was done by his niece.  A medical interpreter was offered, however he declined.  He was not aware of why he was being seen today.  He denied nonhealing ulcerations on his fistula, but has appreciated prolonged bleeding after dialysis with increased pulsatility per dialysis nurses.  He has no pain at his fistula, and denies symptoms of steal syndrome.  The pt is  on a statin for cholesterol management.  The pt is - on a daily aspirin.   Other AC:  - The pt is  on medications for hypertension.   The pt is  diabetic. Tobacco hx:  former  Past Medical History:  Diagnosis Date   Anemia    Diabetes ([place])    Diabetes mellitus type 2, controlled ([place]) 08/04/2015   Diabetic retinopathy ([place])    Diabetic retinopathy ([place])    Dialysis patient ([place])    Diarrhea    ESRD (end stage renal disease) ([place]) 03/10/2016   High cholesterol    Hypertension    Nausea 08/2017    Past Surgical History:  Procedure Laterality Date   AV FISTULA PLACEMENT Right 09/04/2015   Procedure: ARTERIOVENOUS (AV) FISTULA CREATION;  Surgeon: Eric Mould, MD;  Location: [facility];  Service: Vascular;  Laterality: Right;    AV FISTULA PLACEMENT Right 01/22/2016   Procedure: RIGHT UPPER ARM BRACHIOCEPHALIC ARTERIOVENOUS (AV) FISTULA CREATION;  Surgeon: Eric Mould, MD;  Location: [facility];  Service: Vascular;  Laterality: Right;   IR DIALY SHUNT INTRO [place] W/IMG RIGHT Right 12/08/2018   IR DIALY SHUNT INTRO [place] W/IMG RIGHT Right 08/18/2020   LIGATION OF ARTERIOVENOUS  FISTULA Right 01/22/2016   Procedure: LIGATION OF RIGHT FOREARM ARTERIOVENOUS  FISTULA;  Surgeon: Eric Mould, MD;  Location: [facility];  Service: Vascular;  Laterality: Right;   PERIPHERAL VASCULAR CATHETERIZATION N/A 01/12/2016   Procedure: Fistulagram;  Surgeon: Eric Mould, MD;  Location: [facility];  Service: Cardiovascular;  Laterality: N/A;   UMBILICAL HERNIA REPAIR      Social History   Socioeconomic History   Marital status: Single    Spouse name: Not on file   Number of children: Not on file   Years of education: Not on file   Highest education level: Not on file  Occupational History   Not on file  Tobacco Use   Smoking status: Former    Types: Cigarettes    Quit date: 10/22/2003    Years since quitting: 17.8   Smokeless tobacco: Never  Vaping Use   Vaping Use: Never used  Substance and Sexual Activity   Alcohol use: No    Alcohol/week: 0.0 standard drinks   Drug use:  No   Sexual activity: Not on file  Other Topics Concern   Not on file  Social History Narrative   Not on file   Social Determinants of Health   Financial Resource Strain: Not on file  Food Insecurity: Not on file  Transportation Needs: Not on file  Physical Activity: Not on file  Stress: Not on file  Social Connections: Not on file  Intimate Partner Violence: Not on file    Family History  Problem Relation Age of Onset   Hypertension Mother    Hyperlipidemia Mother    Diabetes Mellitus II Sister     Current Facility-Administered Medications  Medication Dose Route Frequency  Provider Last Rate Last Admin   0.9 %  sodium chloride infusion  250 mL Intravenous PRN Eric John, MD       sodium chloride flush (NS) 0.9 % injection 3 mL  3 mL Intravenous Q12H Eric John, MD       sodium chloride flush (NS) 0.9 % injection 3 mL  3 mL Intravenous PRN Eric John, MD        Allergies  Allergen Reactions   Other Other (See Comments) and Rash    Raises blood pressure   Poractant Alfa Rash and Other (See Comments)    Raises blood pressure   Pork-Derived Products Rash and Other (See Comments)    Raises blood pressure   Penicillins Hives     REVIEW OF SYSTEMS:   [X]  denotes positive finding, [ ]  denotes negative finding Cardiac  Comments:  Chest pain or chest pressure:    Shortness of breath upon exertion:    Short of breath when lying flat:    Irregular heart rhythm:        Vascular    Pain in calf, thigh, or hip brought on by ambulation:    Pain in feet at night that wakes you up from your sleep:     Blood clot in your veins:    Leg swelling:         Pulmonary    Oxygen at home:    Productive cough:     Wheezing:         Neurologic    Sudden weakness in arms or legs:     Sudden numbness in arms or legs:     Sudden onset of difficulty speaking or slurred speech:    Temporary loss of vision in one eye:     Problems with dizziness:         Gastrointestinal    Blood in stool:     Vomited blood:         Genitourinary    Burning when urinating:     Blood in urine:        Psychiatric    Major depression:         Hematologic    Bleeding problems:    Problems with blood clotting too easily:        Skin    Rashes or ulcers:        Constitutional    Fever or chills:      PHYSICAL EXAMINATION:  Vitals:   08/12/21 0954 08/12/21 1225  BP: (!) 197/81   Pulse: 68   Resp: 18   Temp: 98.4 F (36.9 C)   TempSrc: Oral   SpO2: 99% 100%  Weight: 95.3 kg   Height: 5\' 5"  (1.651 m)     General:  WDWN in NAD; vital signs  documented above Gait: Not observed HENT: WNL, normocephalic Pulmonary: normal non-labored breathing  Cardiac: regular HR Abdomen: soft, NT, no masses Skin: without rashes Vascular Exam/Pulses:  Right Left  Radial 2+ (normal) 2+ (normal)  Ulnar 2+ (normal) 2+ (normal)                   Extremities: without ischemic changes, without Gangrene , without cellulitis; without open wounds;  Musculoskeletal: no muscle wasting or atrophy  Neurologic: A&O X 3;  No focal weakness or paresthesias are detected Psychiatric:  The pt has Normal affect.   Non-Invasive Vascular Imaging:   -    ASSESSMENT/PLAN:  Eric Glenn is a 56 y.o. 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  There are no open ulcerations.  Healthy dermis between the fistula and the skin.  At dialysis, [facility] and nurses have appreciated prolonged bleeding with decannulation.  There is concern for central stenosis, and therefore I offered right upper extremity fistulogram.  After discussing the risk and benefits, Eric Glenn elected to proceed.  Eric John, MD [facility] [phone]

## 2021-08-12 NOTE — Op Note (Signed)
° ° °  Patient name: [name] MRN: [id number] DOB: [birth year] Sex: male  08/12/2021 Pre-operative Diagnosis: Concern for central stenosis  Post-operative diagnosis:  Same Surgeon:  Broadus John, MD Procedure Performed: 1.  Ultrasound-guided micropuncture access of the right arm brachiocephalic fistula 2.  Fistulogram 3.  Balloon angioplasty using a 10 x 40 mm balloon at multiple points within his right brachiocephalic fistula.  Indications:  Eric Glenn is a 56 y.o. 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  There are no open ulcerations.  Healthy dermis between the fistula and the skin.  At dialysis, [facility] and nurses have appreciated prolonged bleeding with decannulation.  There is concern for central stenosis, and therefore I offered right upper extremity fistulogram. After discussing the risk and benefits, Ta[place] elected to proceed.  Findings:  Normal brachiocephalic anastomosis Greater than 80% stenosis between the 2 aneurysms of the fistula, roughly 4 cm from the anastomosis. Greater than 60% stenosis immediately central to the second aneurysm. Greater than 60% stenosis at the junction of the cephalic vein and axillary vein.   Procedure: Patient was brought to the cardiac Cath Lab and laid in the supine position.  The patient was prepped and draped in standard fashion and a timeout was performed.  The fistula was accessed using an ultrasound-guided micropuncture needle.  Fistulogram followed see results above. I elected to intervene on multiple segments within the brachiocephalic fistula. A 10 mm x 40 mm angioplasty balloon was brought onto the field and dilated in succession for centimeters from the anastomosis, central to the second aneurysm, and at the junction of the cephalic vein and axillary vein.  Fistulogram after balloon angioplasty demonstrated significant improvement with less than 50% stenosis appreciated  between the 2 aneurysms, less than 30% stenosis immediately central to the second aneurysm, less than 30% stenosis at the junction of the cephalic vein and axillary vein.  Impression: Successful balloon angioplasty of 3 lesions within the right-sided brachiocephalic fistula.  This significantly reduced the pulsatility within the fistula, and should decrease postdialysis bleeding.  Both Jos and his daughter are aware that should postdialysis bleeding continue, he will require open revision.   Cassandria Santee, MD [facility] [place] Office: [phone]

## 2021-08-13 ENCOUNTER — Encounter (HOSPITAL_COMMUNITY): Payer: Self-pay | Admitting: Vascular Surgery

## 2021-08-13 MED FILL — Heparin Sod (Porcine)-NaCl IV Soln 1000 Unit/500ML-0.9%: INTRAVENOUS | Qty: 500 | Status: AC

## 2021-08-24 ENCOUNTER — Other Ambulatory Visit (HOSPITAL_COMMUNITY): Payer: Self-pay

## 2021-08-24 ENCOUNTER — Encounter (HOSPITAL_COMMUNITY): Payer: Self-pay | Admitting: Nephrology

## 2021-09-14 ENCOUNTER — Encounter (HOSPITAL_COMMUNITY): Payer: Self-pay | Admitting: Nephrology

## 2021-09-14 ENCOUNTER — Other Ambulatory Visit (HOSPITAL_COMMUNITY): Payer: Self-pay

## 2021-09-14 ENCOUNTER — Other Ambulatory Visit: Payer: Self-pay | Admitting: Internal Medicine

## 2021-09-14 DIAGNOSIS — I10 Essential (primary) hypertension: Secondary | ICD-10-CM

## 2021-09-14 MED ORDER — AMLODIPINE BESYLATE 10 MG PO TABS
10.0000 mg | ORAL_TABLET | Freq: Every day | ORAL | 1 refills | Status: DC
  Filled 2021-09-14: qty 30, 30d supply, fill #0
  Filled 2021-10-15: qty 30, 30d supply, fill #1
  Filled 2021-11-18: qty 30, 30d supply, fill #2
  Filled 2021-12-10: qty 30, 30d supply, fill #3
  Filled 2022-01-13: qty 30, 30d supply, fill #4
  Filled 2022-02-17: qty 30, 30d supply, fill #5

## 2021-10-02 ENCOUNTER — Other Ambulatory Visit (HOSPITAL_COMMUNITY): Payer: Self-pay

## 2021-10-02 ENCOUNTER — Encounter (HOSPITAL_COMMUNITY): Payer: Self-pay | Admitting: Nephrology

## 2021-10-15 ENCOUNTER — Other Ambulatory Visit (HOSPITAL_COMMUNITY): Payer: Self-pay

## 2021-10-15 ENCOUNTER — Encounter (HOSPITAL_COMMUNITY): Payer: Self-pay | Admitting: Nephrology

## 2021-10-19 ENCOUNTER — Encounter: Payer: Self-pay | Admitting: Dietician

## 2021-10-19 ENCOUNTER — Other Ambulatory Visit: Payer: Self-pay

## 2021-10-19 ENCOUNTER — Encounter (HOSPITAL_COMMUNITY): Payer: Self-pay | Admitting: Nephrology

## 2021-10-19 ENCOUNTER — Encounter: Payer: Self-pay | Admitting: Student

## 2021-10-19 ENCOUNTER — Ambulatory Visit (INDEPENDENT_AMBULATORY_CARE_PROVIDER_SITE_OTHER): Payer: Self-pay | Admitting: Student

## 2021-10-19 ENCOUNTER — Telehealth: Payer: Self-pay | Admitting: Dietician

## 2021-10-19 ENCOUNTER — Other Ambulatory Visit (HOSPITAL_COMMUNITY): Payer: Self-pay

## 2021-10-19 VITALS — BP 151/81 | HR 68 | Temp 98.1°F | Ht 66.0 in | Wt 214.2 lb

## 2021-10-19 DIAGNOSIS — Z5989 Other problems related to housing and economic circumstances: Secondary | ICD-10-CM

## 2021-10-19 DIAGNOSIS — E1122 Type 2 diabetes mellitus with diabetic chronic kidney disease: Secondary | ICD-10-CM

## 2021-10-19 DIAGNOSIS — L97521 Non-pressure chronic ulcer of other part of left foot limited to breakdown of skin: Secondary | ICD-10-CM

## 2021-10-19 DIAGNOSIS — Z23 Encounter for immunization: Secondary | ICD-10-CM

## 2021-10-19 DIAGNOSIS — E119 Type 2 diabetes mellitus without complications: Secondary | ICD-10-CM

## 2021-10-19 DIAGNOSIS — I10 Essential (primary) hypertension: Secondary | ICD-10-CM

## 2021-10-19 DIAGNOSIS — L97529 Non-pressure chronic ulcer of other part of left foot with unspecified severity: Secondary | ICD-10-CM | POA: Insufficient documentation

## 2021-10-19 DIAGNOSIS — I120 Hypertensive chronic kidney disease with stage 5 chronic kidney disease or end stage renal disease: Secondary | ICD-10-CM

## 2021-10-19 DIAGNOSIS — E1169 Type 2 diabetes mellitus with other specified complication: Secondary | ICD-10-CM

## 2021-10-19 DIAGNOSIS — Z992 Dependence on renal dialysis: Secondary | ICD-10-CM

## 2021-10-19 DIAGNOSIS — Z0000 Encounter for general adult medical examination without abnormal findings: Secondary | ICD-10-CM

## 2021-10-19 DIAGNOSIS — S90426A Blister (nonthermal), unspecified lesser toe(s), initial encounter: Secondary | ICD-10-CM

## 2021-10-19 DIAGNOSIS — E11621 Type 2 diabetes mellitus with foot ulcer: Secondary | ICD-10-CM

## 2021-10-19 DIAGNOSIS — N186 End stage renal disease: Secondary | ICD-10-CM

## 2021-10-19 LAB — POCT GLYCOSYLATED HEMOGLOBIN (HGB A1C): Hemoglobin A1C: 9.1 % — AB (ref 4.0–5.6)

## 2021-10-19 LAB — GLUCOSE, CAPILLARY: Glucose-Capillary: 250 mg/dL — ABNORMAL HIGH (ref 70–99)

## 2021-10-19 MED ORDER — LANTUS SOLOSTAR 100 UNIT/ML ~~LOC~~ SOPN: 30.0000 [IU] | PEN_INJECTOR | Freq: Two times a day (BID) | SUBCUTANEOUS | 6 refills | Status: DC

## 2021-10-19 MED ORDER — FREESTYLE LIBRE 3 SENSOR MISC
1.0000 | 10 refills | Status: AC
  Filled 2021-10-19: qty 2, 28d supply, fill #0

## 2021-10-19 NOTE — Patient Instructions (Addendum)
Mr. Nosbisch, ? ?It was a pleasure seeing you in the clinic today.  Here is a summary of what we talked about: ? ?1.  Toe ulcer: The wound does not look infected at this time.  Please keep the wound clean, dry.  Please apply Bacitracin ointment and clean bandage to the area.  We will keep a closed eye on his wound. ? ?2.  High blood pressure: Please continue his current medications.  His blood pressure will improve with dialysis. ? ?3.  Diabetes: I have order the freestyle libre.  I also placed a referral for [name], our diabetic coordinator, to help you with the device instruction.  For now please continue Lantus 30 units twice daily. ? ?Please follow-up in 2 weeks ? ?Take care, ? ?Dr. Alfonse Spruce  ?

## 2021-10-19 NOTE — Telephone Encounter (Signed)
Call to patient and nieces to give them the [employer] phone number to call for a discount code for [facility] sensors. It brings the price down to 75$ for 2 sensors (28 days) Left a message with th phone number and will also mail a letter with the phone number.  ?

## 2021-10-19 NOTE — Progress Notes (Signed)
? ?  CC: Blister on toe ? ?HPI: ? ?Mr.Quindarrius Joplin is a 56 y.o. with past medical history of hypertension, type 2 diabetes, ESRD who presents to the clinic today for evaluation of a blister on his toe. ? ?Please see problem based charting for detail ? ?Past Medical History:  ?Diagnosis Date  ? Anemia   ? Diabetes ([place])   ? Diabetes mellitus type 2, controlled ([place]) 08/04/2015  ? Diabetic retinopathy ([place])   ? Diabetic retinopathy ([place])   ? Dialysis patient [facility])   ? Diarrhea   ? ESRD (end stage renal disease) ([place]) 03/10/2016  ? High cholesterol   ? Hypertension   ? Nausea 08/2017  ? ?Review of Systems:  per HPI ? ?Physical Exam: ? ?Vitals:  ? 10/19/21 1404 10/19/21 1407  ?BP: (!) 175/71 (!) 151/81  ?Pulse: 72 68  ?Temp: 98.1 ?F (36.7 ?C)   ?TempSrc: Oral   ?SpO2: 97%   ?Weight: 214 lb 3.2 oz (97.2 kg)   ?Height: 5\' 6"  (1.676 m)   ? ?Physical Exam ?Constitutional:   ?   General: He is not in acute distress. ?HENT:  ?   Head: Normocephalic.  ?Eyes:  ?   General:     ?   Right eye: No discharge.     ?   Left eye: No discharge.  ?Cardiovascular:  ?   Rate and Rhythm: Normal rate and regular rhythm.  ?   Comments: Trace edema bilateral lower extremity ?Pulmonary:  ?   Effort: Pulmonary effort is normal. No respiratory distress.  ?   Breath sounds: Normal breath sounds.  ?Musculoskeletal:  ?   Comments: Shallow-based ulcer between second and third toe of left foot.  No purulent discharge.  No edema or erythema of the left foot seen. ?Right AVF palpable thrill  ?Skin: ?   General: Skin is warm.  ?Neurological:  ?   General: No focal deficit present.  ?   Mental Status: He is alert.  ?Psychiatric:     ?   Mood and Affect: Mood normal.     ?   Behavior: Behavior normal.  ?  ? ? ? ?Assessment & Plan:  ? ?See Encounters Tab for problem based charting. ? ?Patient discussed with Dr. Heber Tenaha  ?

## 2021-10-19 NOTE — Assessment & Plan Note (Signed)
Patient reports an ulcer on his left foot that started 2 weeks ago.  There was originally a warts there which he peeled off, which now became an ulcer.  He is unsure of purulent discharge due to his impaired vision.  He thought it was bleeding at some point. ? ?Physical exam show a shallow based ulcer between second and third toe.  There was no purulent discharge.  No edema or erythema of the left foot.   ? ?Without signs of active infection, antibiotic is not indicated.  Patient however will need close monitoring to make sure that the wound is healing properly. ? ?-Advised patient to use bacitracin ointment with clean bandage ?-Follow-up in 2 weeks ? ? ?

## 2021-10-19 NOTE — Assessment & Plan Note (Signed)
Blood pressure elevated at 151/81.  He reports adherence to amlodipine, Coreg and hydralazine.  He has dialysis yesterday and next session will be tomorrow. ? ?Patient appear mildly fluid overloaded on exam with trace edema bilateral lower extremity.  Suspect his hypotension could be from her volume overload.  Will not increase Coreg given his pulse in the 60s.  Will hold off on adding clonidine to avoid polypharmacy. ? ?Will allow higher blood pressure goal given his ESRD and multiple comorbidities.  His blood pressure should improve with HD. ? ?-Continue current therapy ? ?

## 2021-10-19 NOTE — Assessment & Plan Note (Signed)
Patient declined COVID-vaccine ?Tdap vaccine given today ? ?

## 2021-10-19 NOTE — Assessment & Plan Note (Signed)
A1c of 9.1.  He is currently taking Lantus 30 units twice daily.  He is no longer on Trulicity due to cost.  He does not check his blood sugar due to impaired vision.  He lives with his family but they are working.  His family is helping him with medication and insulin injection. ? ?He reports possible hypoglycemic events after HD with symptoms of tremors and fatigue.  He did not check his blood sugar at that time. ? ?Will allow a higher A1c goal with his end-stage kidney disease and multiple comorbidity.  Our main goal is to avoid hypoglycemia and overt hyperglycemia.  ? ?Patient is more comfortable with a freestyle libre and thinks that it would be more convenient for his family to check his blood sugar.  We will place referral for [name] Plyler to help with his device. ? ?Metformin, SGLT2 or Byetta are contraindicated due to his ESRD. ? ?-Continue Lantus 30 units twice daily for now ?-Will get more data from his freestyle libre before adjusting his insulin ?-Follow-up in 2-week ?

## 2021-10-20 NOTE — Progress Notes (Signed)
Internal Medicine Clinic Attending  Case discussed with Dr. Nguyen  At the time of the visit.  We reviewed the resident's history and exam and pertinent patient test results.  I agree with the assessment, diagnosis, and plan of care documented in the resident's note. 

## 2021-10-28 NOTE — Addendum Note (Signed)
Addended by: Buddy Duty on: 10/28/2021 08:03 AM ? ? Modules accepted: Orders ? ?

## 2021-10-29 ENCOUNTER — Telehealth: Payer: Self-pay

## 2021-10-29 NOTE — Telephone Encounter (Signed)
? ?  Telephone encounter was:  Unsuccessful.  10/29/2021 ?Name: Hayden Kihara MRN: [id number] DOB: 01/26/1966 ? ?Unsuccessful outbound call made today to assist with:   GCCN/Insurance ? ?Outreach Attempt:  1st Attempt ? ?A HIPAA compliant message was left requesting a return call.  Instructed niece to have patient to call back at [phone] at their earliest convenience. ? ?Cameron Proud ?Care Guide, Embedded Care Coordination ?[facility]  Care management  ?[place], [place]  ?Main Phone: [phone]  E-mail: [email or web address]  ?Website: [email or web address] ? ? ? ? ?

## 2021-10-30 ENCOUNTER — Telehealth: Payer: Self-pay

## 2021-10-30 NOTE — Telephone Encounter (Signed)
? ?  Telephone encounter was:  Unsuccessful.  10/30/2021 ?Name: Eric Glenn MRN: [id number] DOB: [birth year] ? ?Unsuccessful outbound call made today to assist with:   GCCN/Insurance ? ?Outreach Attempt:  2nd Attempt ? ?Tried both numbers again. ?Called Niece Magda Paganini [phone] as she was the one I spoke to yesterday (she advised pt was not there and that they would call back in one hour. No called back returned). I was unable to reach her or leave a voicemail. I also tried calling pt's number again and did not get through either [phone]. The system for both phones say try again later. ? ?Cameron Proud ?Care Guide, Embedded Care Coordination ?[facility]  Care management  ?[place], [place]  ?Main Phone: [phone]  E-mail: [email or web address]  ?Website: [email or web address] ? ? ? ?

## 2021-11-02 ENCOUNTER — Telehealth: Payer: Self-pay

## 2021-11-02 NOTE — Telephone Encounter (Signed)
? ?  Telephone encounter was:  Unsuccessful.  11/02/2021 ?Name: Shawna Kiener MRN: [id number] DOB: 01/01/1966 ? ?Unsuccessful outbound call made today to assist with:   GCCN/Insurance ? ?Outreach Attempt:  3rd Attempt.  Referral closed unable to contact patient. ? ?I have been unsuccessful in reaching pt. I have called pt's phone as well as two nieces. I did speak to his niece Magda Paganini on 4/13 and she advised she would have pt contact me back within and hour, I never received a callback. I called pt and Magda Paganini the next day 4/14 and today 4/17 and still no answer. I have also tried to reach niece Dyann Ruddle today and was unsuccessful at that also. ? ?CG will notify clinic/provider. ? ?Cameron Proud ?Care Guide, Embedded Care Coordination ?[facility]  Care management  ?[place], [place]  ?Main Phone: [phone]  E-mail: [email or web address]  ?Website: [email or web address] ? ? ? ?

## 2021-11-11 ENCOUNTER — Other Ambulatory Visit (HOSPITAL_COMMUNITY): Payer: Self-pay

## 2021-11-11 ENCOUNTER — Other Ambulatory Visit: Payer: Self-pay | Admitting: *Deleted

## 2021-11-11 ENCOUNTER — Encounter (HOSPITAL_COMMUNITY): Payer: Self-pay | Admitting: Nephrology

## 2021-11-11 DIAGNOSIS — E119 Type 2 diabetes mellitus without complications: Secondary | ICD-10-CM

## 2021-11-11 MED ORDER — LANTUS SOLOSTAR 100 UNIT/ML ~~LOC~~ SOPN
30.0000 [IU] | PEN_INJECTOR | Freq: Two times a day (BID) | SUBCUTANEOUS | 6 refills | Status: DC
  Filled 2021-11-11: qty 18, 30d supply, fill #0
  Filled 2021-12-10: qty 18, 30d supply, fill #1
  Filled 2022-02-04: qty 18, 30d supply, fill #2
  Filled 2022-03-17: qty 18, 30d supply, fill #3
  Filled 2022-04-21: qty 18, 30d supply, fill #4
  Filled 2022-05-24: qty 18, 30d supply, fill #5
  Filled 2022-06-25: qty 18, 30d supply, fill #6
  Filled 2022-08-12: qty 18, 30d supply, fill #7
  Filled 2022-09-15: qty 18, 30d supply, fill #8

## 2021-11-11 NOTE — Telephone Encounter (Signed)
Patient's niece called in requesting refill on lantus 35 units BID. Explained this was decreased to 30 units BID at [place] on 4/3. Will forward request. ?

## 2021-11-13 ENCOUNTER — Other Ambulatory Visit (HOSPITAL_COMMUNITY): Payer: Self-pay

## 2021-11-17 ENCOUNTER — Other Ambulatory Visit (HOSPITAL_COMMUNITY): Payer: Self-pay

## 2021-11-17 ENCOUNTER — Encounter (HOSPITAL_COMMUNITY): Payer: Self-pay | Admitting: Nephrology

## 2021-11-18 ENCOUNTER — Other Ambulatory Visit: Payer: Self-pay | Admitting: Student

## 2021-11-18 ENCOUNTER — Other Ambulatory Visit (HOSPITAL_COMMUNITY): Payer: Self-pay

## 2021-11-18 ENCOUNTER — Encounter (HOSPITAL_COMMUNITY): Payer: Self-pay | Admitting: Nephrology

## 2021-11-18 DIAGNOSIS — I10 Essential (primary) hypertension: Secondary | ICD-10-CM

## 2021-11-19 ENCOUNTER — Other Ambulatory Visit (HOSPITAL_COMMUNITY): Payer: Self-pay

## 2021-11-19 ENCOUNTER — Encounter (HOSPITAL_COMMUNITY): Payer: Self-pay | Admitting: Nephrology

## 2021-11-19 MED ORDER — CARVEDILOL 12.5 MG PO TABS
12.5000 mg | ORAL_TABLET | Freq: Two times a day (BID) | ORAL | 3 refills | Status: DC
  Filled 2021-11-19 – 2021-12-02 (×2): qty 60, 30d supply, fill #0
  Filled 2022-01-13: qty 60, 30d supply, fill #1
  Filled 2022-02-17: qty 60, 30d supply, fill #2
  Filled 2022-03-17: qty 60, 30d supply, fill #3

## 2021-11-27 ENCOUNTER — Other Ambulatory Visit (HOSPITAL_COMMUNITY): Payer: Self-pay

## 2021-11-30 ENCOUNTER — Other Ambulatory Visit (HOSPITAL_COMMUNITY): Payer: Self-pay

## 2021-12-02 ENCOUNTER — Other Ambulatory Visit (HOSPITAL_COMMUNITY): Payer: Self-pay

## 2021-12-02 ENCOUNTER — Encounter (HOSPITAL_COMMUNITY): Payer: Self-pay | Admitting: Nephrology

## 2021-12-10 ENCOUNTER — Encounter (HOSPITAL_COMMUNITY): Payer: Self-pay | Admitting: Nephrology

## 2021-12-10 ENCOUNTER — Other Ambulatory Visit (HOSPITAL_COMMUNITY): Payer: Self-pay

## 2021-12-10 ENCOUNTER — Other Ambulatory Visit: Payer: Self-pay | Admitting: Internal Medicine

## 2021-12-10 DIAGNOSIS — I10 Essential (primary) hypertension: Secondary | ICD-10-CM

## 2021-12-10 MED ORDER — HYDRALAZINE HCL 100 MG PO TABS
100.0000 mg | ORAL_TABLET | Freq: Three times a day (TID) | ORAL | 3 refills | Status: DC
  Filled 2021-12-10: qty 90, 30d supply, fill #0
  Filled 2022-01-13: qty 90, 30d supply, fill #1
  Filled 2022-02-17: qty 90, 30d supply, fill #2
  Filled 2022-03-17: qty 90, 30d supply, fill #3

## 2021-12-11 ENCOUNTER — Other Ambulatory Visit (HOSPITAL_COMMUNITY): Payer: Self-pay

## 2022-01-13 ENCOUNTER — Other Ambulatory Visit (HOSPITAL_COMMUNITY): Payer: Self-pay

## 2022-01-13 ENCOUNTER — Encounter (HOSPITAL_COMMUNITY): Payer: Self-pay | Admitting: Nephrology

## 2022-01-15 ENCOUNTER — Other Ambulatory Visit (HOSPITAL_COMMUNITY): Payer: Self-pay

## 2022-02-04 ENCOUNTER — Encounter (HOSPITAL_COMMUNITY): Payer: Self-pay | Admitting: Nephrology

## 2022-02-04 ENCOUNTER — Other Ambulatory Visit (HOSPITAL_COMMUNITY): Payer: Self-pay

## 2022-02-17 ENCOUNTER — Encounter (HOSPITAL_COMMUNITY): Payer: Self-pay | Admitting: Nephrology

## 2022-02-17 ENCOUNTER — Other Ambulatory Visit (HOSPITAL_COMMUNITY): Payer: Self-pay

## 2022-02-24 ENCOUNTER — Other Ambulatory Visit (HOSPITAL_COMMUNITY): Payer: Self-pay | Admitting: Nephrology

## 2022-02-24 DIAGNOSIS — N186 End stage renal disease: Secondary | ICD-10-CM

## 2022-02-25 ENCOUNTER — Other Ambulatory Visit: Payer: Self-pay | Admitting: Radiology

## 2022-02-26 ENCOUNTER — Ambulatory Visit (HOSPITAL_COMMUNITY)
Admission: EM | Admit: 2022-02-26 | Discharge: 2022-02-26 | Disposition: A | Discharge status: Home / Self Care | Payer: Self-pay | Attending: Internal Medicine | Admitting: Internal Medicine

## 2022-02-26 ENCOUNTER — Encounter (HOSPITAL_COMMUNITY): Payer: Self-pay | Admitting: Emergency Medicine

## 2022-02-26 DIAGNOSIS — R531 Weakness: Secondary | ICD-10-CM

## 2022-02-26 DIAGNOSIS — R42 Dizziness and giddiness: Secondary | ICD-10-CM

## 2022-02-26 DIAGNOSIS — Z992 Dependence on renal dialysis: Secondary | ICD-10-CM

## 2022-02-26 DIAGNOSIS — E11319 Type 2 diabetes mellitus with unspecified diabetic retinopathy without macular edema: Secondary | ICD-10-CM

## 2022-02-26 DIAGNOSIS — N186 End stage renal disease: Secondary | ICD-10-CM

## 2022-02-26 DIAGNOSIS — E1122 Type 2 diabetes mellitus with diabetic chronic kidney disease: Secondary | ICD-10-CM

## 2022-02-26 NOTE — ED Triage Notes (Signed)
Pt reports dizziness and weakness the past 2 weeks. States he notices the dizziness when he gets weak in the knees and starts feeling a heavy sensation in both hands.  Also endorses a headache, states feels like his head is heavy.  Dialysis patient, last tx was yesterday. Normally feels dizzy before dialysis treatment and 3 hours later after tx he starts feeling worse. Reports yesterday at treatment he has shortness of breath and oxygen was applied.

## 2022-02-26 NOTE — Discharge Instructions (Addendum)
Your blood pressure is normal, it is borderline positive for orthostatic hypotension which is due to shifts of volume of your blood. Please discuss this with your dialysis team. We did not end up drawing labs on you today since they were drawn yesterday and are stable. Your EKG is unremarkable. Please follow-up with your retinal specialist, I question if there is an issue in the vitreous as the cause of the shapes he is seeing. Please follow up with PCP for further workup and management. Consider referral to PT to help with deconditioning.

## 2022-02-26 NOTE — ED Provider Notes (Signed)
[place]    CSN: [id number] Arrival date & time: 02/26/22  1317      History   Chief Complaint Chief Complaint  Patient presents with   Dizziness   Weakness    HPI Eric Glenn is a 56 y.o. male.   Pleasant 56 year old male presents today due to concerns of dizziness and weakness that have been ongoing for the past 2 weeks.  Patient has a known history of type 2 diabetes, anemia, end-stage renal disease for which he attends dialysis 3 days a week.  He goes Tuesday Thursday and Saturday.  His last treatment was yesterday.  He states for the past 2-week he has been feeling abnormally dizzy, as if things were spinning.  He states this is worse during dialysis.  Patient states that during dialysis he will lay in a chair with his head towards the floor and his legs up towards the ceiling to help maintain his blood pressure.  After about 2 hours of treatment, he will start to get a headache.  He also reports about that same time he will start to get weakness in his knees and his legs will start twitching.  He has not notified nursing staff due to language barrier, and apparently no translators are available.  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  He has some residual vision in the right eye, and describes his dizziness as "shapes and things coming at me". Spoke with nurse at [facility] who stated his labs were just drawn yesterday and his Hgb was 11.4, sodium 137, K 4.2. No other worrisome labs/ changes. She stated his care team was aware of his intermittent dizzy episodes. He did have some shortness of breath yesterday while at dialysis, but has none today.   Dizziness Associated symptoms: weakness   Weakness Associated symptoms: dizziness     Past Medical History:  Diagnosis Date   Anemia    Diabetes ([place])    Diabetes mellitus type 2, controlled ([place]) 08/04/2015   Diabetic retinopathy ([place])    Diabetic  retinopathy ([place])    Dialysis patient ([place])    Diarrhea    ESRD (end stage renal disease) ([place]) 03/10/2016   High cholesterol    Hypertension    Nausea 08/2017    Patient Active Problem List   Diagnosis Date Noted   Ulcer of left foot ([place]) 10/19/2021   Subungual hematoma of great toe of right foot 05/22/2021   Healthcare maintenance 01/28/2021   Acute infective otitis externa, right    Onychomycosis 12/14/2020   Ulcer of right foot, limited to breakdown of skin ([place]) 11/28/2019   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 09/27/2019   ESRD (end stage renal disease) ([place]) 03/10/2016   Asthma 10/01/2015   Obesity 10/01/2015   Hyperlipidemia 08/19/2015   Essential hypertension 08/04/2015   Type 2 diabetes mellitus with other specified complication ([place]) 56/21/3086   Diabetic retinopathy associated with type 2 diabetes mellitus ([place]) 08/04/2015    Past Surgical History:  Procedure Laterality Date   A/V FISTULAGRAM Right 08/12/2021   Procedure: A/V FISTULAGRAM;  Surgeon: Broadus John, MD;  Location: [facility];  Service: Cardiovascular;  Laterality: Right;   AV FISTULA PLACEMENT Right 09/04/2015   Procedure: ARTERIOVENOUS (AV) FISTULA CREATION;  Surgeon: Angelia Mould, MD;  Location: [place];  Service: Vascular;  Laterality: Right;   AV FISTULA PLACEMENT Right 01/22/2016   Procedure: RIGHT  UPPER ARM BRACHIOCEPHALIC ARTERIOVENOUS (AV) FISTULA CREATION;  Surgeon: Angelia Mould, MD;  Location: [facility];  Service: Vascular;  Laterality: Right;   IR DIALY SHUNT INTRO [place] W/IMG RIGHT Right 12/08/2018   IR DIALY SHUNT INTRO NEED[place]/INTRACATH INITIAL W/IMG RIGHT Right 08/18/2020   LIGATION OF ARTERIOVENOUS  FISTULA Right 01/22/2016   Procedure: LIGATION OF RIGHT FOREARM ARTERIOVENOUS  FISTULA;  Surgeon: Angelia Mould, MD;  Location: [place];  Service: Vascular;  Laterality: Right;   PERIPHERAL VASCULAR BALLOON  ANGIOPLASTY Right 08/12/2021   Procedure: PERIPHERAL VASCULAR BALLOON ANGIOPLASTY;  Surgeon: Broadus John, MD;  Location: [facility];  Service: Cardiovascular;  Laterality: Right;  upper arm PTA   PERIPHERAL VASCULAR CATHETERIZATION N/A 01/12/2016   Procedure: Fistulagram;  Surgeon: Angelia Mould, MD;  Location: [place];  Service: Cardiovascular;  Laterality: N/A;   UMBILICAL HERNIA REPAIR         Home Medications    Prior to Admission medications   Medication Sig Start Date End Date Taking? Authorizing Provider  acetaminophen (TYLENOL) 325 MG tablet Take 325-650 mg by mouth every 6 (six) hours as needed for mild pain or headache.    [provider]  albuterol (VENTOLIN HFA) 108 (90 Base) MCG/ACT inhaler Inhale 1-2 puffs into the lungs every 6 (six) hours as needed for wheezing or shortness of breath. 03/24/21   Pearson Forster, NP  amLODipine (NORVASC) 10 MG tablet Take 1 tablet (10 mg total) by mouth daily. 09/14/21   Atway, Rayann N, DO  atorvastatin (LIPITOR) 40 MG tablet TAKE 1 TABLET (40 MG TOTAL) BY MOUTH DAILY. 08/01/20 08/12/21  Jeralyn Bennett, MD  carvedilol (COREG) 12.5 MG tablet Take 1 tablet (12.5 mg total) by mouth 2 (two) times daily. 11/19/21   Atway, Rayann N, DO  Continuous Blood Gluc Sensor (FREESTYLE LIBRE 3 SENSOR) MISC Place 1 sensor on the skin every 14 days and use to check glucose continuously. 10/19/21   Gaylan Gerold, DO  glucose blood (CONTOUR NEXT TEST) test strip Test blood glucose 4 times daily 01/01/19   Welford Roche, MD  hydrALAZINE (APRESOLINE) 100 MG tablet Take 1 tablet (100 mg total) by mouth 3 (three) times daily. 12/10/21   Atway, Rayann N, DO  insulin glargine (LANTUS SOLOSTAR) 100 UNIT/ML Solostar Pen Inject 30 Units into the skin 2 (two) times daily. 11/11/21   Atway, Rayann N, DO  Insulin Pen Needle 32G X 4 MM MISC USE AS DIRECTED TWICE DAILY. 06/06/20 06/06/21  [name], MD  Lancets MISC Test blood glucose 4  times daily 01/01/19   Welford Roche, MD  methocarbamol (ROBAXIN) 500 MG tablet Take 1 tablet (500 mg total) by mouth 2 (two) times daily. 11/11/20   Wieters, Hallie C, PA-C  multivitamin (RENA-VIT) TABS tablet Take 1 tablet by mouth daily. 08/04/18   Santos-Sanchez, Merlene Morse, MD  ofloxacin (OCUFLOX) 0.3 % ophthalmic solution Place 1 drop into the left eye 4 (four) times daily. Start 1 day before surgery 02/12/21     Omega-3 Fatty Acids (FISH OIL) 1000 MG CAPS Take 1,000 mg by mouth in the morning and at bedtime.    [provider]  senna-docusate (SENOKOT S) 8.6-50 MG tablet Take 1 tablet by mouth daily. 05/28/19   [name], MD    Family History Family History  Problem Relation Age of Onset   Hypertension Mother    Hyperlipidemia Mother    Diabetes Mellitus II Sister     Social  History Social History   Tobacco Use   Smoking status: Former    Types: Cigarettes    Quit date: 10/22/2003    Years since quitting: 18.3   Smokeless tobacco: Never  Vaping Use   Vaping Use: Never used  Substance Use Topics   Alcohol use: No    Alcohol/week: 0.0 standard drinks of alcohol   Drug use: No     Allergies   Other, Poractant alfa, Pork-derived products, and Penicillins   Review of Systems Review of Systems  Neurological:  Positive for dizziness and weakness.     Physical Exam Triage Vital Signs ED Triage Vitals [02/26/22 1359]  Enc Vitals Group     BP (!) 143/71     Pulse Rate 60     Resp 18     Temp 98.1 F (36.7 C)     Temp Source Oral     SpO2 96 %     Weight      Height      Head Circumference      Peak Flow      Pain Score      Pain Loc      Pain Edu?      Excl. in [place]?    Orthostatic VS for the past 24 hrs:  BP- Lying Pulse- Lying BP- Sitting Pulse- Sitting BP- Standing at 0 minutes Pulse- Standing at 0 minutes  02/26/22 1439 161/78 64 156/78 63 135/66 63    Updated Vital Signs BP (!) 143/71 (BP Location: Left Arm)   Pulse 60   Temp 98.1 F  (36.7 C) (Oral)   Resp 18   SpO2 96%   Visual Acuity Right Eye Distance:   Left Eye Distance:   Bilateral Distance:    Right Eye Near:   Left Eye Near:    Bilateral Near:     Physical Exam Vitals and nursing note reviewed. Exam conducted with a chaperone present.  Constitutional:      General: He is not in acute distress.    Appearance: Normal appearance. He is obese. He is not ill-appearing, toxic-appearing or diaphoretic.  HENT:     Head: Normocephalic and atraumatic.     Right Ear: Tympanic membrane, ear canal and external ear normal. There is impacted cerumen.     Left Ear: Tympanic membrane, ear canal and external ear normal. There is impacted cerumen.     Mouth/Throat:     Mouth: Mucous membranes are moist.     Pharynx: Oropharynx is clear. No oropharyngeal exudate or posterior oropharyngeal erythema.  Eyes:     General: No scleral icterus.    Extraocular Movements: Extraocular movements intact.     Conjunctiva/sclera: Conjunctivae normal.     Pupils: Pupils are equal, round, and reactive to light.     Comments: Significant pterygium B  Cardiovascular:     Rate and Rhythm: Normal rate and regular rhythm.     Pulses: Normal pulses.  Pulmonary:     Effort: Pulmonary effort is normal. No respiratory distress.     Breath sounds: Normal breath sounds. No stridor. No wheezing, rhonchi or rales.  Chest:     Chest wall: No tenderness.  Musculoskeletal:        General: Normal range of motion.     Cervical back: Normal range of motion and neck supple. No rigidity or tenderness.     Right lower leg: No edema.     Left lower leg: No edema.  Lymphadenopathy:  Cervical: No cervical adenopathy.  Skin:    General: Skin is warm and dry.     Coloration: Skin is not jaundiced.     Findings: No erythema or rash.     Comments: Large AV fistula protruding from R bicep  Neurological:     General: No focal deficit present.     Mental Status: He is alert and oriented to person,  place, and time. Mental status is at baseline.     Cranial Nerves: Cranial nerves 2-12 are intact. No cranial nerve deficit, dysarthria or facial asymmetry.     Sensory: Sensation is intact. No sensory deficit.     Motor: Motor function is intact. No weakness.     Coordination: Coordination normal.     Gait: Gait is intact. Gait normal.     Deep Tendon Reflexes: Reflexes normal.  Psychiatric:        Mood and Affect: Mood normal.      UC Treatments / Results  Labs (all labs ordered are listed, but only abnormal results are displayed) Labs Reviewed - No data to display   EKG   Radiology No results found.  Procedures ED EKG  Date/Time: 02/26/2022 2:44 PM  Performed by: Chaney Malling, PA Authorized by: Chase Picket, MD   Previous ECG:    Previous ECG:  Compared to current   Similarity:  No change   Comparison ECG info:  Feb 2022 Interpretation:    Interpretation: normal   Rate:    ECG rate assessment: normal   Rhythm:    Rhythm: sinus rhythm   ST segments:    ST segments:  Normal T waves:    T waves: normal    (including critical care time)  Medications Ordered in UC Medications - No data to display  Initial Impression / Assessment and Plan / UC Course  I have reviewed the triage vital signs and the nursing notes.  Pertinent labs & imaging results that were available during my care of the patient were reviewed by me and considered in my medical decision making (see chart for details).     Dizziness/ weakness - called and spoke with dialysis.  Nurse stated this has been an intermittent and persistent issue that the staff has been well aware of.  Patient is borderline positive for orthostasis, suspect this is secondary to his volume changes.  Labs were done just yesterday at dialysis with significant improvement noted to all of them.  Patient describes some of his weakness as being floating objects in his eyes, recommended they schedule a follow-up with his  retinal specialist to determine if this may be partly the cause.  Unfortunately, this may be in part further progression of the disease process. Pt is otherwise stable and appears confortable. Orthostatics borderline, recommended slow transitions, follow renal diet. EKG unchanged from 1.5 years ago. Consider PT referral, f/u with PCP ESRD - as above.   Final Clinical Impressions(s) / UC Diagnoses   Final diagnoses:  Dizziness  Weakness  End stage renal disease ([place])     Discharge Instructions      Your blood pressure is normal, it is borderline positive for orthostatic hypotension which is due to shifts of volume of your blood. Please discuss this with your dialysis team. We did not end up drawing labs on you today since they were drawn yesterday and are stable. Your EKG is unremarkable. Please follow-up with your retinal specialist, I question if there is an issue in the vitreous as  the cause of the shapes he is seeing. Please follow up with PCP for further workup and management. Consider referral to PT to help with deconditioning.     ED Prescriptions   None    PDMP not reviewed this encounter.   Chaney Malling, [place] 02/26/22 1523

## 2022-02-28 ENCOUNTER — Other Ambulatory Visit: Payer: Self-pay | Admitting: Student

## 2022-03-01 ENCOUNTER — Encounter (HOSPITAL_COMMUNITY): Payer: Self-pay

## 2022-03-01 ENCOUNTER — Other Ambulatory Visit: Payer: Self-pay

## 2022-03-01 ENCOUNTER — Ambulatory Visit (HOSPITAL_COMMUNITY)
Admission: RE | Admit: 2022-03-01 | Discharge: 2022-03-01 | Disposition: A | Discharge status: Home / Self Care | Payer: Self-pay | Source: Ambulatory Visit | Attending: Nephrology | Admitting: Nephrology

## 2022-03-01 ENCOUNTER — Other Ambulatory Visit (HOSPITAL_COMMUNITY): Payer: Self-pay | Admitting: Nephrology

## 2022-03-01 DIAGNOSIS — E1122 Type 2 diabetes mellitus with diabetic chronic kidney disease: Secondary | ICD-10-CM | POA: Insufficient documentation

## 2022-03-01 DIAGNOSIS — Z992 Dependence on renal dialysis: Secondary | ICD-10-CM | POA: Insufficient documentation

## 2022-03-01 DIAGNOSIS — N186 End stage renal disease: Secondary | ICD-10-CM | POA: Insufficient documentation

## 2022-03-01 DIAGNOSIS — Y841 Kidney dialysis as the cause of abnormal reaction of the patient, or of later complication, without mention of misadventure at the time of the procedure: Secondary | ICD-10-CM | POA: Insufficient documentation

## 2022-03-01 DIAGNOSIS — Z794 Long term (current) use of insulin: Secondary | ICD-10-CM | POA: Insufficient documentation

## 2022-03-01 DIAGNOSIS — I120 Hypertensive chronic kidney disease with stage 5 chronic kidney disease or end stage renal disease: Secondary | ICD-10-CM | POA: Insufficient documentation

## 2022-03-01 DIAGNOSIS — T82858A Stenosis of vascular prosthetic devices, implants and grafts, initial encounter: Secondary | ICD-10-CM | POA: Insufficient documentation

## 2022-03-01 DIAGNOSIS — Z87891 Personal history of nicotine dependence: Secondary | ICD-10-CM | POA: Insufficient documentation

## 2022-03-01 HISTORY — PX: IR AV DIALY SHUNT INTRO NEEDLE/INTRACATH INITIAL W/PTA/IMG RIGHT: IMG6116

## 2022-03-01 HISTORY — PX: IR US GUIDE VASC ACCESS RIGHT: IMG2390

## 2022-03-01 LAB — GLUCOSE, CAPILLARY: Glucose-Capillary: 161 mg/dL — ABNORMAL HIGH (ref 70–99)

## 2022-03-01 MED ORDER — IOHEXOL 300 MG/ML  SOLN
100.0000 mL | Freq: Once | INTRAMUSCULAR | Status: AC | PRN
Start: 2022-03-01 — End: 2022-03-01
  Administered 2022-03-01: 65 mL via INTRAVENOUS

## 2022-03-01 MED ORDER — LIDOCAINE HCL 1 % IJ SOLN
INTRAMUSCULAR | Status: AC
  Filled 2022-03-01: qty 20

## 2022-03-01 MED ORDER — MIDAZOLAM HCL 2 MG/2ML IJ SOLN
INTRAMUSCULAR | Status: AC | PRN
  Administered 2022-03-01: .5 mg via INTRAVENOUS

## 2022-03-01 MED ORDER — FENTANYL CITRATE (PF) 100 MCG/2ML IJ SOLN
INTRAMUSCULAR | Status: AC
  Filled 2022-03-01: qty 2

## 2022-03-01 MED ORDER — SODIUM CHLORIDE 0.9 % IV SOLN: INTRAVENOUS | Status: DC

## 2022-03-01 MED ORDER — MIDAZOLAM HCL 2 MG/2ML IJ SOLN
INTRAMUSCULAR | Status: AC
  Filled 2022-03-01: qty 2

## 2022-03-01 MED ORDER — FENTANYL CITRATE (PF) 100 MCG/2ML IJ SOLN
INTRAMUSCULAR | Status: AC | PRN
  Administered 2022-03-01: 25 ug via INTRAVENOUS

## 2022-03-01 NOTE — H&P (Signed)
Chief Complaint: Flow issues with fistula  Referring Physician(s): Joelyn Oms  Supervising Physician: Juliet Rude  Patient Status: [facility] - Out-pt  History of Present Illness: Eric Glenn is a 56 y.o. male with history of end-stage renal disease on hemodialysis via right upper extremity brachiocephalic fistula.  Per the referral form, there are flow issues with the fistula while on dialysis.  He is known to our service for previous fistulogram done 08/18/20 which showed no flow limiting stenosis.  Most recent fistulogram was performed by Dr. Orlie Pollen on 08/12/21 = Procedure Performed: 1.  Ultrasound-guided micropuncture access of the right arm brachiocephalic fistula 2.  Fistulogram 3.  Balloon angioplasty using a 10 x 40 mm balloon at multiple points within his right brachiocephalic fistula.  He is here today for fistulogram with possible angioplasty/stent and there is always the possibility of placement of a tunneled  dialysis catheter.  He is NPO. Interpreter used for discussion.  He states he has been dizzy lately and was seen in the ED on Friday for that issue. No nausea/vomiting. No Fever/chills. ROS negative.  Past Medical History:  Diagnosis Date   Anemia    Diabetes ([place])    Diabetes mellitus type 2, controlled ([place]) 08/04/2015   Diabetic retinopathy ([place])    Diabetic retinopathy ([place])    Dialysis patient [facility])    Diarrhea    ESRD (end stage renal disease) ([place]) 03/10/2016   High cholesterol    Hypertension    Nausea 08/2017    Past Surgical History:  Procedure Laterality Date   A/V FISTULAGRAM Right 08/12/2021   Procedure: A/V FISTULAGRAM;  Surgeon: Broadus John, MD;  Location: [place] [facility];  Service: Cardiovascular;  Laterality: Right;   AV FISTULA PLACEMENT Right 09/04/2015   Procedure: ARTERIOVENOUS (AV) FISTULA CREATION;  Surgeon: Angelia Mould, MD;  Location: [place];  Service: Vascular;  Laterality: Right;   AV FISTULA  PLACEMENT Right 01/22/2016   Procedure: RIGHT UPPER ARM BRACHIOCEPHALIC ARTERIOVENOUS (AV) FISTULA CREATION;  Surgeon: Angelia Mould, MD;  Location: [place];  Service: Vascular;  Laterality: Right;   IR DIALY SHUNT INTRO [place] W/IMG RIGHT Right 12/08/2018   IR DIALY SHUNT INTRO [place] INITIAL W/IMG RIGHT Right 08/18/2020   LIGATION OF ARTERIOVENOUS  FISTULA Right 01/22/2016   Procedure: LIGATION OF RIGHT FOREARM ARTERIOVENOUS  FISTULA;  Surgeon: Angelia Mould, MD;  Location: [place];  Service: Vascular;  Laterality: Right;   PERIPHERAL VASCULAR BALLOON ANGIOPLASTY Right 08/12/2021   Procedure: PERIPHERAL VASCULAR BALLOON ANGIOPLASTY;  Surgeon: Broadus John, MD;  Location: [facility] [place] [facility];  Service: Cardiovascular;  Laterality: Right;  upper arm PTA   PERIPHERAL VASCULAR CATHETERIZATION N/A 01/12/2016   Procedure: Fistulagram;  Surgeon: Angelia Mould, MD;  Location: [facility];  Service: Cardiovascular;  Laterality: N/A;   UMBILICAL HERNIA REPAIR      Allergies: Other, Poractant alfa, Pork-derived products, and Penicillins  Medications: Prior to Admission medications   Medication Sig Start Date End Date Taking? Authorizing Provider  acetaminophen (TYLENOL) 325 MG tablet Take 325-650 mg by mouth every 6 (six) hours as needed for mild pain or headache.   Yes [provider]  amLODipine (NORVASC) 10 MG tablet Take 1 tablet (10 mg total) by mouth daily. 09/14/21  Yes Atway, Rayann N, DO  carvedilol (COREG) 12.5 MG tablet Take 1 tablet (12.5 mg total) by mouth 2 (two) times daily. 11/19/21  Yes Atway, Rayann N, DO  glucose blood (  CONTOUR NEXT TEST) test strip Test blood glucose 4 times daily 01/01/19  Yes Santos-Sanchez, Merlene Morse, MD  hydrALAZINE (APRESOLINE) 100 MG tablet Take 1 tablet (100 mg total) by mouth 3 (three) times daily. 12/10/21  Yes Atway, Rayann N, DO  insulin glargine (LANTUS SOLOSTAR) 100 UNIT/ML Solostar Pen Inject 30 Units  into the skin 2 (two) times daily. 11/11/21  Yes Atway, Rayann N, DO  Lancets MISC Test blood glucose 4 times daily 01/01/19  Yes Santos-Sanchez, Merlene Morse, MD  multivitamin (RENA-VIT) TABS tablet Take 1 tablet by mouth daily. 08/04/18  Yes Santos-Sanchez, Merlene Morse, MD  Omega-3 Fatty Acids (FISH OIL) 1000 MG CAPS Take 1,000 mg by mouth in the morning and at bedtime.   Yes [provider]  albuterol (VENTOLIN HFA) 108 (90 Base) MCG/ACT inhaler Inhale 1-2 puffs into the lungs every 6 (six) hours as needed for wheezing or shortness of breath. 03/24/21   Pearson Forster, NP  atorvastatin (LIPITOR) 40 MG tablet TAKE 1 TABLET (40 MG TOTAL) BY MOUTH DAILY. 08/01/20 08/12/21  Jeralyn Bennett, MD  Continuous Blood Gluc Sensor (FREESTYLE LIBRE 3 SENSOR) MISC Place 1 sensor on the skin every 14 days and use to check glucose continuously. 10/19/21   Gaylan Gerold, DO  Insulin Pen Needle 32G X 4 MM MISC USE AS DIRECTED TWICE DAILY. 06/06/20 06/06/21  [name], MD  methocarbamol (ROBAXIN) 500 MG tablet Take 1 tablet (500 mg total) by mouth 2 (two) times daily. 11/11/20   Wieters, Hallie C, PA-C  ofloxacin (OCUFLOX) 0.3 % ophthalmic solution Place 1 drop into the left eye 4 (four) times daily. Start 1 day before surgery 02/12/21     senna-docusate (SENOKOT S) 8.6-50 MG tablet Take 1 tablet by mouth daily. 05/28/19   [name], MD     Family History  Problem Relation Age of Onset   Hypertension Mother    Hyperlipidemia Mother    Diabetes Mellitus II Sister     Social History   Socioeconomic History   Marital status: Single    Spouse name: Not on file   Number of children: Not on file   Years of education: Not on file   Highest education level: Not on file  Occupational History   Not on file  Tobacco Use   Smoking status: Former    Types: Cigarettes    Quit date: 10/22/2003    Years since quitting: 18.3   Smokeless tobacco: Never  Vaping Use   Vaping Use: Never used  Substance and Sexual Activity    Alcohol use: No    Alcohol/week: 0.0 standard drinks of alcohol   Drug use: No   Sexual activity: Not on file  Other Topics Concern   Not on file  Social History Narrative   Not on file   Social Determinants of Health   Financial Resource Strain: Not on file  Food Insecurity: Not on file  Transportation Needs: Not on file  Physical Activity: Not on file  Stress: Not on file  Social Connections: Not on file     Review of Systems: A 12 point ROS discussed and pertinent positives are indicated in the HPI above.  All other systems are negative.  Review of Systems  Vital Signs: BP (!) 181/86   Pulse 67   Temp 97.7 F (36.5 C) (Temporal)   Resp 18   Ht 5\' 4"  (1.626 m)   Wt 200 lb (90.7 kg)   SpO2 99%   BMI 34.33 kg/m  Physical Exam Vitals reviewed.  Constitutional:      Appearance: Normal appearance.  HENT:     Head: Normocephalic and atraumatic.  Eyes:     Comments: Visual impairment  Cardiovascular:     Rate and Rhythm: Normal rate and regular rhythm.  Pulmonary:     Effort: Pulmonary effort is normal. No respiratory distress.     Breath sounds: Normal breath sounds.  Abdominal:     Palpations: Abdomen is soft.  Musculoskeletal:        General: Normal range of motion.     Cervical back: Normal range of motion.     Comments: Right brachiocephalic fistula with 2 large aneurysms visible. Palpable thrill all the way up to the neck. Good audible bruit up to the neck with no changes in pitch.  Skin:    General: Skin is warm and dry.  Neurological:     General: No focal deficit present.     Mental Status: He is alert and oriented to person, place, and time.  Psychiatric:        Mood and Affect: Mood normal.        Behavior: Behavior normal.        Thought Content: Thought content normal.        Judgment: Judgment normal.     Imaging: No results found.  Labs:  CBC: Recent Labs    08/12/21 1025  HGB 13.9  HCT 41.0    COAGS: No results for  input(s): "INR", "APTT" in the last 8760 hours.  BMP: Recent Labs    08/12/21 1025  NA 133*  K 4.5  CL 95*  GLUCOSE 243*  BUN 57*  CREATININE 8.10*    LIVER FUNCTION TESTS: No results for input(s): "BILITOT", "AST", "ALT", "ALKPHOS", "PROT", "ALBUMIN" in the last 8760 hours.  TUMOR MARKERS: No results for input(s): "AFPTM", "CEA", "CA199", "CHROMGRNA" in the last 8760 hours.  Assessment and Plan:  ESRD on hemodialysis via right brachiocephalic fistula with flow issues while on dialysis.  Will proceed with fistulogram with possible angioplasty/stent today by Dr. Denna Haggard.  Risks and benefits of the dialysis circuit study were discussed with the patient including, but not limited to bleeding, infection, vascular injury, and inability to improve the function of the fistula/graft which would require placement of a tunneled dialysis catheter.  All of the patient's questions were answered, patient is agreeable to proceed. Consent signed and in chart.  Thank you for allowing our service to participate in [facility] 's care.  Electronically Signed: Murrell Redden, PA-C   03/01/2022, 10:16 AM      I spent a total of   25 Minutes in face to face in clinical consultation, greater than 50% of which was counseling/coordinating care for fistulogram.

## 2022-03-01 NOTE — Procedures (Signed)
Interventional Radiology Procedure Note  Date of Procedure: 03/01/2022  Procedure: Fistulagram   Findings:  1. Fistulagram, RUE AVF with stenosis of cephalic arch s/p successful PTA to 9 mm   Complications: No immediate complications noted.   Estimated Blood Loss: minimal  Follow-up and Recommendations: 1. Bedrest 1 hour  2. RUE AVF ready for use    Albin Felling, MD  Vascular & Interventional Radiology  03/01/2022 11:44 AM

## 2022-03-04 ENCOUNTER — Other Ambulatory Visit (HOSPITAL_COMMUNITY): Payer: Self-pay

## 2022-03-04 ENCOUNTER — Encounter (HOSPITAL_COMMUNITY): Payer: Self-pay | Admitting: Nephrology

## 2022-03-04 MED ORDER — MECLIZINE HCL 25 MG PO TABS
25.0000 mg | ORAL_TABLET | Freq: Two times a day (BID) | ORAL | 6 refills | Status: AC | PRN
  Filled 2022-03-04: qty 60, 30d supply, fill #0

## 2022-03-17 ENCOUNTER — Other Ambulatory Visit (HOSPITAL_COMMUNITY): Payer: Self-pay

## 2022-03-17 ENCOUNTER — Encounter (HOSPITAL_COMMUNITY): Payer: Self-pay | Admitting: Nephrology

## 2022-03-17 ENCOUNTER — Other Ambulatory Visit: Payer: Self-pay | Admitting: Internal Medicine

## 2022-03-17 DIAGNOSIS — I10 Essential (primary) hypertension: Secondary | ICD-10-CM

## 2022-03-17 MED ORDER — AMLODIPINE BESYLATE 10 MG PO TABS
10.0000 mg | ORAL_TABLET | Freq: Every day | ORAL | 3 refills | Status: DC
  Filled 2022-03-17: qty 30, 30d supply, fill #0
  Filled 2022-04-21: qty 30, 30d supply, fill #1
  Filled 2022-05-24: qty 30, 30d supply, fill #2
  Filled 2022-06-21: qty 30, 30d supply, fill #3
  Filled 2022-07-21: qty 30, 30d supply, fill #4
  Filled 2022-08-27: qty 30, 30d supply, fill #5
  Filled 2022-09-24: qty 30, 30d supply, fill #6
  Filled 2022-10-20: qty 30, 30d supply, fill #7
  Filled 2022-11-23: qty 30, 30d supply, fill #8
  Filled 2022-12-20: qty 30, 30d supply, fill #9
  Filled 2023-01-18: qty 30, 30d supply, fill #10
  Filled 2023-02-25 (×2): qty 30, 30d supply, fill #11

## 2022-04-21 ENCOUNTER — Other Ambulatory Visit (HOSPITAL_COMMUNITY): Payer: Self-pay

## 2022-04-21 ENCOUNTER — Encounter (HOSPITAL_COMMUNITY): Payer: Self-pay | Admitting: Nephrology

## 2022-04-21 ENCOUNTER — Other Ambulatory Visit: Payer: Self-pay | Admitting: Internal Medicine

## 2022-04-21 DIAGNOSIS — I10 Essential (primary) hypertension: Secondary | ICD-10-CM

## 2022-04-21 MED ORDER — HYDRALAZINE HCL 100 MG PO TABS
100.0000 mg | ORAL_TABLET | Freq: Three times a day (TID) | ORAL | 3 refills | Status: DC
  Filled 2022-04-21: qty 90, 30d supply, fill #0
  Filled 2022-05-24: qty 90, 30d supply, fill #1
  Filled 2022-07-21: qty 90, 30d supply, fill #2
  Filled 2022-08-27: qty 90, 30d supply, fill #3

## 2022-04-21 MED ORDER — CARVEDILOL 12.5 MG PO TABS
12.5000 mg | ORAL_TABLET | Freq: Two times a day (BID) | ORAL | 3 refills | Status: DC
  Filled 2022-04-21: qty 60, 30d supply, fill #0
  Filled 2022-05-24: qty 60, 30d supply, fill #1
  Filled 2022-06-21: qty 60, 30d supply, fill #2
  Filled 2022-07-21: qty 60, 30d supply, fill #3

## 2022-05-16 ENCOUNTER — Ambulatory Visit (HOSPITAL_COMMUNITY)
Admission: EM | Admit: 2022-05-16 | Discharge: 2022-05-16 | Disposition: A | Discharge status: Home / Self Care | Payer: Self-pay | Attending: Family Medicine | Admitting: Family Medicine

## 2022-05-16 ENCOUNTER — Encounter (HOSPITAL_COMMUNITY): Payer: Self-pay | Admitting: *Deleted

## 2022-05-16 ENCOUNTER — Other Ambulatory Visit: Payer: Self-pay

## 2022-05-16 DIAGNOSIS — R519 Headache, unspecified: Secondary | ICD-10-CM

## 2022-05-16 MED ORDER — SUMATRIPTAN SUCCINATE 6 MG/0.5ML ~~LOC~~ SOLN
6.0000 mg | Freq: Once | SUBCUTANEOUS | Status: AC
Start: 2022-05-16 — End: 2022-05-16
  Administered 2022-05-16: 6 mg via SUBCUTANEOUS

## 2022-05-16 MED ORDER — ONDANSETRON 4 MG PO TBDP: 4.0000 mg | ORAL_TABLET | Freq: Three times a day (TID) | ORAL | 0 refills | Status: DC | PRN

## 2022-05-16 MED ORDER — ONDANSETRON 4 MG PO TBDP
4.0000 mg | ORAL_TABLET | Freq: Three times a day (TID) | ORAL | 0 refills | Status: DC | PRN
  Filled 2022-05-16: qty 10, 4d supply, fill #0

## 2022-05-16 MED ORDER — SUMATRIPTAN SUCCINATE 6 MG/0.5ML ~~LOC~~ SOLN
SUBCUTANEOUS | Status: AC
  Filled 2022-05-16: qty 0.5

## 2022-05-16 NOTE — ED Provider Notes (Signed)
[place]    CSN: [id number] Arrival date & time: 05/16/22  1103      History   Chief Complaint Chief Complaint  Patient presents with   Headache    HPI Avenir Lozinski is a 56 y.o. male.    Headache  Here for headache and nausea.  He was seen here in August for vertigo and headache that would mainly come right after dialysis.  Shortly after that his primary care did supply prescription for meclizine, but he has since stopped trying to take it because it makes him very sleepy and groggy the next day.  It did not help the headache of course, and did help the dizziness some.  In the last 3 days the headache has been more constant and he has had some nausea.  No vomiting.  He has had some loose stools 2 or 3 times a day.  No fever or chills and no cough  Headache is bitemporal and behind his left eye.  It is throbbing.  The patient is legally blind  He does have diabetes; he last had dialysis yesterday.   Past Medical History:  Diagnosis Date   Anemia    Diabetes ([place])    Diabetes mellitus type 2, controlled ([place]) 08/04/2015   Diabetic retinopathy ([place])    Diabetic retinopathy ([place])    Dialysis patient ([place])    Diarrhea    ESRD (end stage renal disease) ([place]) 03/10/2016   High cholesterol    Hypertension    Nausea 08/2017    Patient Active Problem List   Diagnosis Date Noted   Ulcer of left foot ([place]) 10/19/2021   Subungual hematoma of great toe of right foot 05/22/2021   Healthcare maintenance 01/28/2021   Acute infective otitis externa, right    Onychomycosis 12/14/2020   Ulcer of right foot, limited to breakdown of skin ([place]) 11/28/2019   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 09/27/2019   ESRD (end stage renal disease) ([place]) 03/10/2016   Asthma 10/01/2015   Obesity 10/01/2015   Hyperlipidemia 08/19/2015   Essential hypertension 08/04/2015   Type 2 diabetes mellitus with other specified complication  ([place]) 78/29/5621   Diabetic retinopathy associated with type 2 diabetes mellitus ([place]) 08/04/2015    Past Surgical History:  Procedure Laterality Date   A/V FISTULAGRAM Right 08/12/2021   Procedure: A/V FISTULAGRAM;  Surgeon: Broadus John, MD;  Location: [facility];  Service: Cardiovascular;  Laterality: Right;   AV FISTULA PLACEMENT Right 09/04/2015   Procedure: ARTERIOVENOUS (AV) FISTULA CREATION;  Surgeon: Angelia Mould, MD;  Location: [facility];  Service: Vascular;  Laterality: Right;   AV FISTULA PLACEMENT Right 01/22/2016   Procedure: RIGHT UPPER ARM BRACHIOCEPHALIC ARTERIOVENOUS (AV) FISTULA CREATION;  Surgeon: Angelia Mould, MD;  Location: [facility];  Service: Vascular;  Laterality: Right;   IR AV DIALY SHUNT INTRO NEEDLE/INTRACATH INITIAL W/PTA/IMG RIGHT Right 03/01/2022   IR DIALY SHUNT INTRO NEEDLE/INTRACATH INITIAL W/IMG RIGHT Right 12/08/2018   IR DIALY SHUNT INTRO NEEDLE/INTRACATH INITIAL W/IMG RIGHT Right 08/18/2020   IR US GUIDE VASC ACCESS RIGHT  03/01/2022   LIGATION OF ARTERIOVENOUS  FISTULA Right 01/22/2016   Procedure: LIGATION OF RIGHT FOREARM ARTERIOVENOUS  FISTULA;  Surgeon: Angelia Mould, MD;  Location: [facility];  Service: Vascular;  Laterality: Right;   PERIPHERAL VASCULAR BALLOON ANGIOPLASTY Right 08/12/2021   Procedure: PERIPHERAL VASCULAR BALLOON ANGIOPLASTY;  Surgeon: Broadus John, MD;  Location: [facility];  Service: Cardiovascular;  Laterality: Right;  upper arm PTA   PERIPHERAL VASCULAR CATHETERIZATION N/A 01/12/2016   Procedure: Fistulagram;  Surgeon: Angelia Mould, MD;  Location: [facility];  Service: Cardiovascular;  Laterality: N/A;   UMBILICAL HERNIA REPAIR         Home Medications    Prior to Admission medications   Medication Sig Start Date End Date Taking? Authorizing Provider  acetaminophen (TYLENOL) 325 MG tablet Take 325-650 mg by mouth every 6 (six) hours as needed for mild pain or headache.     [provider]  albuterol (VENTOLIN HFA) 108 (90 Base) MCG/ACT inhaler Inhale 1-2 puffs into the lungs every 6 (six) hours as needed for wheezing or shortness of breath. 03/24/21   Pearson Forster, NP  amLODipine (NORVASC) 10 MG tablet Take 1 tablet (10 mg total) by mouth daily. 03/17/22   Atway, Rayann N, DO  atorvastatin (LIPITOR) 40 MG tablet TAKE 1 TABLET (40 MG TOTAL) BY MOUTH DAILY. 08/01/20 08/12/21  Jeralyn Bennett, MD  carvedilol (COREG) 12.5 MG tablet Take 1 tablet (12.5 mg total) by mouth 2 (two) times daily. 04/21/22   Gaylan Gerold, DO  Continuous Blood Gluc Sensor (FREESTYLE LIBRE 3 SENSOR) MISC Place 1 sensor on the skin every 14 days and use to check glucose continuously. 10/19/21   Gaylan Gerold, DO  glucose blood (CONTOUR NEXT TEST) test strip Test blood glucose 4 times daily 01/01/19   Welford Roche, MD  hydrALAZINE (APRESOLINE) 100 MG tablet Take 1 tablet (100 mg total) by mouth 3 (three) times daily. 04/21/22   Gaylan Gerold, DO  insulin glargine (LANTUS SOLOSTAR) 100 UNIT/ML Solostar Pen Inject 30 Units into the skin 2 (two) times daily. 11/11/21   Atway, Rayann N, DO  Insulin Pen Needle 32G X 4 MM MISC USE AS DIRECTED TWICE DAILY. 06/06/20 06/06/21  [name], MD  Lancets MISC Test blood glucose 4 times daily 01/01/19   Welford Roche, MD  meclizine (ANTIVERT) 25 MG tablet Take 1 tablet (25 mg total) by mouth 2 (two) times daily as needed for vertigo/dizziness 03/04/22     methocarbamol (ROBAXIN) 500 MG tablet Take 1 tablet (500 mg total) by mouth 2 (two) times daily. 11/11/20   Wieters, Hallie C, PA-C  multivitamin (RENA-VIT) TABS tablet Take 1 tablet by mouth daily. 08/04/18   Santos-Sanchez, Merlene Morse, MD  ofloxacin (OCUFLOX) 0.3 % ophthalmic solution Place 1 drop into the left eye 4 (four) times daily. Start 1 day before surgery 02/12/21     Omega-3 Fatty Acids (FISH OIL) 1000 MG CAPS Take 1,000 mg by mouth in the morning and at bedtime.    [provider]   ondansetron (ZOFRAN-ODT) 4 MG disintegrating tablet Take 1 tablet (4 mg total) by mouth every 8 (eight) hours as needed for nausea or vomiting. 05/16/22   Barrett Henle, MD  senna-docusate (SENOKOT S) 8.6-50 MG tablet Take 1 tablet by mouth daily. 05/28/19   [name], MD    Family History Family History  Problem Relation Age of Onset   Hypertension Mother    Hyperlipidemia Mother    Diabetes Mellitus II Sister     Social History Social History   Tobacco Use   Smoking status: Former    Types: Cigarettes    Quit date: 10/22/2003    Years since quitting: 18.5   Smokeless tobacco: Never  Vaping Use   Vaping Use: Never used  Substance Use Topics   Alcohol use: No  Alcohol/week: 0.0 standard drinks of alcohol   Drug use: No     Allergies   Other, Poractant alfa, Pork-derived products, and Penicillins   Review of Systems Review of Systems  Neurological:  Positive for headaches.     Physical Exam Triage Vital Signs ED Triage Vitals [05/16/22 1143]  Enc Vitals Group     BP (!) 158/66     Pulse Rate 63     Resp 18     Temp 98.2 F (36.8 C)     Temp src      SpO2 98 %     Weight      Height      Head Circumference      Peak Flow      Pain Score      Pain Loc      Pain Edu?      Excl. in [place]?    No data found.  Updated Vital Signs BP (!) 158/66   Pulse 63   Temp 98.2 F (36.8 C)   Resp 18   SpO2 98%   Visual Acuity Right Eye Distance:   Left Eye Distance:   Bilateral Distance:    Right Eye Near:   Left Eye Near:    Bilateral Near:     Physical Exam Vitals reviewed.  Constitutional:      General: He is not in acute distress.    Appearance: He is not ill-appearing or toxic-appearing.  HENT:     Mouth/Throat:     Mouth: Mucous membranes are moist.     Pharynx: No oropharyngeal exudate or posterior oropharyngeal erythema.  Eyes:     Comments: PERRL. Pterygium on left  Cardiovascular:     Rate and Rhythm: Normal rate and regular  rhythm.     Heart sounds: No murmur heard. Pulmonary:     Effort: Pulmonary effort is normal.     Breath sounds: Normal breath sounds. No stridor. No wheezing, rhonchi or rales.  Skin:    Coloration: Skin is not jaundiced or pale.  Neurological:     Mental Status: He is alert and oriented to person, place, and time.     Cranial Nerves: No cranial nerve deficit (except blindness).     Gait: Gait normal.      UC Treatments / Results  Labs (all labs ordered are listed, but only abnormal results are displayed) Labs Reviewed - No data to display  EKG   Radiology No results found.  Procedures Procedures (including critical care time)  Medications Ordered in UC Medications  SUMAtriptan (IMITREX) injection 6 mg (has no administration in time range)    Initial Impression / Assessment and Plan / UC Course  I have reviewed the triage vital signs and the nursing notes.  Pertinent labs & imaging results that were available during my care of the patient were reviewed by me and considered in my medical decision making (see chart for details).        Visit conducted in [place] and [place].  His daughter is with him and does help translate some also.  We discussed options and we decided to try an injection of Imitrex alone, which according to up-to-date should be safe even with his poor renal function.  Also Zofran is sent in as that is deemed safe in end stage renal disease  We discussed the differential and how he could have a GI illness currently or this could be signs of a migraine headache.  Patient has never been  diagnosed with migraines, but it does run in the family.  If not improving with this treatment, I have discussed with the patient and his family to consider going to the emergency room Final Clinical Impressions(s) / UC Diagnoses   Final diagnoses:  Intractable headache, unspecified chronicity pattern, unspecified headache type     Discharge Instructions       You have been given a shot of sumatriptan, which hopefully will help the headache.  It is used for migraine headaches  Ondansetron dissolved in the mouth every 8 hours as needed for nausea or vomiting.   If your headache does not improve with this treatment, please consider going to the emergency room for further evaluation.  Also call your primary care first thing tomorrow about this headache and nausea     ED Prescriptions     Medication Sig Dispense Auth. Provider   ondansetron (ZOFRAN-ODT) 4 MG disintegrating tablet  (Status: Discontinued) Take 1 tablet (4 mg total) by mouth every 8 (eight) hours as needed for nausea or vomiting. 10 tablet Barrett Henle, MD   ondansetron (ZOFRAN-ODT) 4 MG disintegrating tablet Take 1 tablet (4 mg total) by mouth every 8 (eight) hours as needed for nausea or vomiting. 10 tablet Windy Carina Gwenlyn Perking, MD      PDMP not reviewed this encounter.   Barrett Henle, MD 05/16/22 1310

## 2022-05-16 NOTE — Discharge Instructions (Addendum)
You have been given a shot of sumatriptan, which hopefully will help the headache.  It is used for migraine headaches  Ondansetron dissolved in the mouth every 8 hours as needed for nausea or vomiting.   If your headache does not improve with this treatment, please consider going to the emergency room for further evaluation.  Also call your primary care first thing tomorrow about this headache and nausea

## 2022-05-16 NOTE — ED Triage Notes (Addendum)
HA  for to weeks. Pt reports Ha worse after dialysis usually but this HA is constant with feeling dizzy. Pt has an RX for Meclizine but does not take it because he is sleepy the next day. Pt is blind Bil.

## 2022-05-17 ENCOUNTER — Other Ambulatory Visit (HOSPITAL_COMMUNITY): Payer: Self-pay

## 2022-05-18 ENCOUNTER — Encounter (HOSPITAL_COMMUNITY): Payer: Self-pay

## 2022-05-18 ENCOUNTER — Emergency Department (HOSPITAL_COMMUNITY)
Admission: EM | Admit: 2022-05-18 | Discharge: 2022-05-18 | Disposition: A | Discharge status: Home / Self Care | Payer: Self-pay | Attending: Emergency Medicine | Admitting: Emergency Medicine

## 2022-05-18 ENCOUNTER — Emergency Department (HOSPITAL_COMMUNITY): Payer: Self-pay

## 2022-05-18 ENCOUNTER — Other Ambulatory Visit: Payer: Self-pay

## 2022-05-18 DIAGNOSIS — Z794 Long term (current) use of insulin: Secondary | ICD-10-CM | POA: Insufficient documentation

## 2022-05-18 DIAGNOSIS — I120 Hypertensive chronic kidney disease with stage 5 chronic kidney disease or end stage renal disease: Secondary | ICD-10-CM | POA: Insufficient documentation

## 2022-05-18 DIAGNOSIS — Z7951 Long term (current) use of inhaled steroids: Secondary | ICD-10-CM | POA: Insufficient documentation

## 2022-05-18 DIAGNOSIS — E1122 Type 2 diabetes mellitus with diabetic chronic kidney disease: Secondary | ICD-10-CM | POA: Insufficient documentation

## 2022-05-18 DIAGNOSIS — H11003 Unspecified pterygium of eye, bilateral: Secondary | ICD-10-CM | POA: Insufficient documentation

## 2022-05-18 DIAGNOSIS — Z79899 Other long term (current) drug therapy: Secondary | ICD-10-CM | POA: Insufficient documentation

## 2022-05-18 DIAGNOSIS — R519 Headache, unspecified: Secondary | ICD-10-CM | POA: Insufficient documentation

## 2022-05-18 DIAGNOSIS — N186 End stage renal disease: Secondary | ICD-10-CM | POA: Insufficient documentation

## 2022-05-18 DIAGNOSIS — H53149 Visual discomfort, unspecified: Secondary | ICD-10-CM | POA: Insufficient documentation

## 2022-05-18 DIAGNOSIS — J45909 Unspecified asthma, uncomplicated: Secondary | ICD-10-CM | POA: Insufficient documentation

## 2022-05-18 DIAGNOSIS — Z992 Dependence on renal dialysis: Secondary | ICD-10-CM | POA: Insufficient documentation

## 2022-05-18 LAB — CBC WITH DIFFERENTIAL/PLATELET
Abs Immature Granulocytes: 0.02 10*3/uL (ref 0.00–0.07)
Basophils Absolute: 0.1 10*3/uL (ref 0.0–0.1)
Basophils Relative: 1 %
Eosinophils Absolute: 0.3 10*3/uL (ref 0.0–0.5)
Eosinophils Relative: 3 %
HCT: 31.8 % — ABNORMAL LOW (ref 39.0–52.0)
Hemoglobin: 11.2 g/dL — ABNORMAL LOW (ref 13.0–17.0)
Immature Granulocytes: 0 %
Lymphocytes Relative: 16 %
Lymphs Abs: 1.3 10*3/uL (ref 0.7–4.0)
MCH: 30.4 pg (ref 26.0–34.0)
MCHC: 35.2 g/dL (ref 30.0–36.0)
MCV: 86.4 fL (ref 80.0–100.0)
Monocytes Absolute: 0.4 10*3/uL (ref 0.1–1.0)
Monocytes Relative: 5 %
Neutro Abs: 6.2 10*3/uL (ref 1.7–7.7)
Neutrophils Relative %: 75 %
Platelets: 136 10*3/uL — ABNORMAL LOW (ref 150–400)
RBC: 3.68 MIL/uL — ABNORMAL LOW (ref 4.22–5.81)
RDW: 12.5 % (ref 11.5–15.5)
WBC: 8.3 10*3/uL (ref 4.0–10.5)
nRBC: 0 % (ref 0.0–0.2)

## 2022-05-18 LAB — BASIC METABOLIC PANEL
Anion gap: 16 — ABNORMAL HIGH (ref 5–15)
BUN: 62 mg/dL — ABNORMAL HIGH (ref 6–20)
CO2: 23 mmol/L (ref 22–32)
Calcium: 9.3 mg/dL (ref 8.9–10.3)
Chloride: 95 mmol/L — ABNORMAL LOW (ref 98–111)
Creatinine, Ser: 10.97 mg/dL — ABNORMAL HIGH (ref 0.61–1.24)
GFR, Estimated: 5 mL/min — ABNORMAL LOW (ref >60)
Glucose, Bld: 208 mg/dL — ABNORMAL HIGH (ref 70–99)
Potassium: 4.2 mmol/L (ref 3.5–5.1)
Sodium: 134 mmol/L — ABNORMAL LOW (ref 135–145)

## 2022-05-18 LAB — SEDIMENTATION RATE: Sed Rate: 35 mm/hr — ABNORMAL HIGH (ref 0–16)

## 2022-05-18 LAB — C-REACTIVE PROTEIN: CRP: <0.5 mg/dL (ref <1.0)

## 2022-05-18 MED ORDER — DIPHENHYDRAMINE HCL 50 MG/ML IJ SOLN
12.5000 mg | Freq: Once | INTRAMUSCULAR | Status: AC
  Administered 2022-05-18: 12.5 mg via INTRAVENOUS
  Filled 2022-05-18: qty 1

## 2022-05-18 MED ORDER — ACETAMINOPHEN 325 MG PO TABS
650.0000 mg | ORAL_TABLET | Freq: Once | ORAL | Status: AC
  Administered 2022-05-18: 650 mg via ORAL
  Filled 2022-05-18: qty 2

## 2022-05-18 MED ORDER — DEXAMETHASONE SODIUM PHOSPHATE 4 MG/ML IJ SOLN
4.0000 mg | Freq: Once | INTRAMUSCULAR | Status: AC
  Administered 2022-05-18: 4 mg via INTRAVENOUS
  Filled 2022-05-18: qty 1

## 2022-05-18 MED ORDER — TETRACAINE HCL 0.5 % OP SOLN
1.0000 [drp] | Freq: Once | OPHTHALMIC | Status: AC
  Administered 2022-05-18: 1 [drp] via OPHTHALMIC
  Filled 2022-05-18: qty 4

## 2022-05-18 MED ORDER — CARVEDILOL 12.5 MG PO TABS
12.5000 mg | ORAL_TABLET | Freq: Once | ORAL | Status: AC
  Administered 2022-05-18: 12.5 mg via ORAL
  Filled 2022-05-18: qty 1

## 2022-05-18 MED ORDER — METOCLOPRAMIDE HCL 5 MG/ML IJ SOLN
10.0000 mg | Freq: Once | INTRAMUSCULAR | Status: AC
  Administered 2022-05-18: 10 mg via INTRAVENOUS
  Filled 2022-05-18: qty 2

## 2022-05-18 MED ORDER — AMLODIPINE BESYLATE 5 MG PO TABS
10.0000 mg | ORAL_TABLET | Freq: Once | ORAL | Status: AC
  Administered 2022-05-18: 10 mg via ORAL
  Filled 2022-05-18: qty 2

## 2022-05-18 MED ORDER — HYDRALAZINE HCL 25 MG PO TABS
100.0000 mg | ORAL_TABLET | Freq: Once | ORAL | Status: AC
  Administered 2022-05-18: 100 mg via ORAL
  Filled 2022-05-18: qty 4

## 2022-05-18 MED ORDER — METOCLOPRAMIDE HCL 5 MG PO TABS
5.0000 mg | ORAL_TABLET | Freq: Two times a day (BID) | ORAL | 0 refills | Status: DC | PRN
  Filled 2022-05-18: qty 30, 10d supply, fill #0
  Filled 2022-05-19: qty 20, 10d supply, fill #0

## 2022-05-18 NOTE — ED Triage Notes (Signed)
Arrives EMS from home with c/o intermittent  headache for the last 2 weeks. Scheduled for dialysis today.   Right arm restriction.

## 2022-05-18 NOTE — ED Provider Notes (Signed)
[facility] EMERGENCY DEPARTMENT Provider Note   CSN: [id number] Arrival date & time: 05/18/22  4580     History  Chief Complaint  Patient presents with   Headache    Eric Glenn is a 56 y.o. male.   Headache Associated symptoms: dizziness, nausea and photophobia   Patient presents for headache.  Medical history includes HTN, T2DM, HLD, asthma, obesity, ESRD (on HD T, TH, SA).  Headache has been intermittent over the past 2 weeks.  Additional symptoms include nausea, dizziness, and photophobia.  Although patient is blind, he does see some light out of 1 eye.  When taken in the bright lights, he states that headache worsens.  He was prescribed meclizine which she only took 2 doses of.  He states that this does not help his symptoms.  He has been taking Tylenol with minimal relief of symptoms.  He went to urgent care 2 days ago and was given a shot of Imitrex.  This gave him some relief for about 1 hour.  Headache subsequently returned.  Patient did not go to dialysis today because he was in the ED waiting room.  He did not take his morning blood pressure medications.  Currently, he endorses a continued headache that is primarily in the left periorbital area.     Home Medications Prior to Admission medications   Medication Sig Start Date End Date Taking? Authorizing Provider  metoCLOPramide (REGLAN) 10 MG tablet Take 1 tablet (10 mg total) by mouth every 8 (eight) hours as needed (For headache and/or nausea). 05/18/22  Yes Godfrey Pick, MD  acetaminophen (TYLENOL) 325 MG tablet Take 325-650 mg by mouth every 6 (six) hours as needed for mild pain or headache.    [provider]  albuterol (VENTOLIN HFA) 108 (90 Base) MCG/ACT inhaler Inhale 1-2 puffs into the lungs every 6 (six) hours as needed for wheezing or shortness of breath. 03/24/21   Pearson Forster, NP  amLODipine (NORVASC) 10 MG tablet Take 1 tablet (10 mg total) by mouth daily. 03/17/22   Atway,  Rayann N, DO  atorvastatin (LIPITOR) 40 MG tablet TAKE 1 TABLET (40 MG TOTAL) BY MOUTH DAILY. 08/01/20 08/12/21  Jeralyn Bennett, MD  carvedilol (COREG) 12.5 MG tablet Take 1 tablet (12.5 mg total) by mouth 2 (two) times daily. 04/21/22   Gaylan Gerold, DO  Continuous Blood Gluc Sensor (FREESTYLE LIBRE 3 SENSOR) MISC Place 1 sensor on the skin every 14 days and use to check glucose continuously. 10/19/21   Gaylan Gerold, DO  glucose blood (CONTOUR NEXT TEST) test strip Test blood glucose 4 times daily 01/01/19   Welford Roche, MD  hydrALAZINE (APRESOLINE) 100 MG tablet Take 1 tablet (100 mg total) by mouth 3 (three) times daily. 04/21/22   Gaylan Gerold, DO  insulin glargine (LANTUS SOLOSTAR) 100 UNIT/ML Solostar Pen Inject 30 Units into the skin 2 (two) times daily. 11/11/21   Atway, Rayann N, DO  Insulin Pen Needle 32G X 4 MM MISC USE AS DIRECTED TWICE DAILY. 06/06/20 06/06/21  [name], MD  Lancets MISC Test blood glucose 4 times daily 01/01/19   Welford Roche, MD  meclizine (ANTIVERT) 25 MG tablet Take 1 tablet (25 mg total) by mouth 2 (two) times daily as needed for vertigo/dizziness 03/04/22     methocarbamol (ROBAXIN) 500 MG tablet Take 1 tablet (500 mg total) by mouth 2 (two) times daily. 11/11/20   Wieters, Hallie C, PA-C  multivitamin (RENA-VIT) TABS tablet Take 1 tablet by  mouth daily. 08/04/18   Santos-Sanchez, Merlene Morse, MD  ofloxacin (OCUFLOX) 0.3 % ophthalmic solution Place 1 drop into the left eye 4 (four) times daily. Start 1 day before surgery 02/12/21     Omega-3 Fatty Acids (FISH OIL) 1000 MG CAPS Take 1,000 mg by mouth in the morning and at bedtime.    [provider]  ondansetron (ZOFRAN-ODT) 4 MG disintegrating tablet Take 1 tablet (4 mg total) by mouth every 8 (eight) hours as needed for nausea or vomiting. 05/16/22   Barrett Henle, MD  senna-docusate (SENOKOT S) 8.6-50 MG tablet Take 1 tablet by mouth daily. 05/28/19   [name], MD      Allergies     Other, Poractant alfa, Pork-derived products, and Penicillins    Review of Systems   Review of Systems  Eyes:  Positive for photophobia.  Gastrointestinal:  Positive for nausea.  Neurological:  Positive for dizziness and headaches.  All other systems reviewed and are negative.   Physical Exam Updated Vital Signs BP (!) 140/75   Pulse 67   Temp 98.4 F (36.9 C) (Oral)   Resp 19   Ht _0  (1.626 m)   Wt 90.7 kg   SpO2 93%   BMI 34.33 kg/m  Physical Exam Vitals and nursing note reviewed.  Constitutional:      General: He is not in acute distress.    Appearance: He is well-developed. He is not toxic-appearing or diaphoretic.  HENT:     Head: Normocephalic and atraumatic.     Mouth/Throat:     Mouth: Mucous membranes are moist.     Pharynx: Oropharynx is clear.  Eyes:     Conjunctiva/sclera: Conjunctivae normal.     Comments: Bilateral pterygia.  IOP 22 in right eye, 24 and left eye.  Baseline blindness.  No conjunctival injection.  Cardiovascular:     Rate and Rhythm: Normal rate and regular rhythm.     Heart sounds: No murmur heard. Pulmonary:     Effort: Pulmonary effort is normal. No respiratory distress.  Abdominal:     General: There is no distension.     Palpations: Abdomen is soft.     Tenderness: There is no abdominal tenderness.  Musculoskeletal:        General: No swelling. Normal range of motion.     Cervical back: Normal range of motion and neck supple.  Skin:    General: Skin is warm and dry.     Capillary Refill: Capillary refill takes less than 2 seconds.     Coloration: Skin is not cyanotic or pale.  Neurological:     Mental Status: He is alert and oriented to person, place, and time.     Cranial Nerves: No cranial nerve deficit, dysarthria or facial asymmetry.     Sensory: No sensory deficit.     Motor: No weakness.     Coordination: Coordination normal.  Psychiatric:        Mood and Affect: Mood normal.        Speech: Speech normal.         Behavior: Behavior normal.     ED Results / Procedures / Treatments   Labs (all labs ordered are listed, but only abnormal results are displayed) Labs Reviewed  BASIC METABOLIC PANEL - Abnormal; Notable for the following components:      Result Value   Sodium 134 (*)    Chloride 95 (*)    Glucose, Bld 208 (*)    BUN  62 (*)    Creatinine, Ser 10.97 (*)    GFR, Estimated 5 (*)    Anion gap 16 (*)    All other components within normal limits  CBC WITH DIFFERENTIAL/PLATELET - Abnormal; Notable for the following components:   RBC 3.68 (*)    Hemoglobin 11.2 (*)    HCT 31.8 (*)    Platelets 136 (*)    All other components within normal limits  SEDIMENTATION RATE - Abnormal; Notable for the following components:   Sed Rate 35 (*)    All other components within normal limits  C-REACTIVE PROTEIN    EKG None  Radiology CT Head Wo Contrast  Result Date: 05/18/2022 CLINICAL DATA:  Dizziness, persistent or recurrent with cardiac or vascular cause suspected. EXAM: CT HEAD WITHOUT CONTRAST TECHNIQUE: Contiguous axial images were obtained from the base of the skull through the vertex without intravenous contrast. RADIATION DOSE REDUCTION: This exam was performed according to the departmental dose-optimization program which includes automated exposure control, adjustment of the mA and/or kV according to patient size and/or use of iterative reconstruction technique. COMPARISON:  04/19/2018 brain MRI FINDINGS: Brain: No evidence of acute infarction, hemorrhage, hydrocephalus, extra-axial collection or mass lesion/mass effect. Mild cerebral volume loss Vascular: No hyperdense vessel or unexpected calcification. Skull: Normal. Negative for fracture or focal lesion. Sinuses/Orbits: Right cataract resection and oil tamponade. No acute finding. IMPRESSION: No acute or interval finding. Electronically Signed   By: Jorje Guild M.D.   On: 05/18/2022 07:59    Procedures Procedures    Medications  Ordered in ED Medications  tetracaine (PONTOCAINE) 0.5 % ophthalmic solution 1 drop (1 drop Both Eyes Given 05/18/22 2034)  metoCLOPramide (REGLAN) injection 10 mg (10 mg Intravenous Given 05/18/22 2047)  diphenhydrAMINE (BENADRYL) injection 12.5 mg (12.5 mg Intravenous Given 05/18/22 2046)  dexamethasone (DECADRON) injection 4 mg (4 mg Intravenous Given 05/18/22 2046)  acetaminophen (TYLENOL) tablet 650 mg (650 mg Oral Given 05/18/22 2032)  amLODipine (NORVASC) tablet 10 mg (10 mg Oral Given 05/18/22 2032)  carvedilol (COREG) tablet 12.5 mg (12.5 mg Oral Given 05/18/22 2032)  hydrALAZINE (APRESOLINE) tablet 100 mg (100 mg Oral Given 05/18/22 2032)    ED Course/ Medical Decision Making/ A&P                           Medical Decision Making Amount and/or Complexity of Data Reviewed Labs: ordered.  Risk OTC drugs. Prescription drug management.   This patient presents to the ED for concern of headache, this involves an extensive number of treatment options, and is a complaint that carries with it a high risk of complications and morbidity.  The differential diagnosis includes migraine headache, cluster headache, acute glaucoma, ICH, GCA, hypertensive crisis   Co morbidities that complicate the patient evaluation  HTN, T2DM, HLD, asthma, obesity, ESRD, blindness   Additional history obtained:  Additional history obtained from patient's niece External records from outside source obtained and reviewed including EMR   Lab Tests:  I Ordered, and personally interpreted labs.  The pertinent results include: Elevated creatinine and BUN consistent with ESRD; normal electrolytes; baseline anemia; no leukocytosis; normal CRP and ESR   Imaging Studies ordered:  I ordered imaging studies including CT head I independently visualized and interpreted imaging which showed no acute findings I agree with the radiologist interpretation   Cardiac Monitoring: / EKG:  The patient was  maintained on a cardiac monitor.  I personally viewed and interpreted the cardiac  monitored which showed an underlying rhythm of: Sinus rhythm  Problem List / ED Course / Critical interventions / Medication management  Patient presents for headache.  This headache has been persistent over the past 2 weeks.  It has waxed and waned in severity.  He has had minimal relief with Tylenol at home or Imitrex at urgent care 2 days ago.  Prior to being bedded in the ED, diagnostic work-up was initiated.  Patient's lab work is reassuring.  CT of head did not show any acute findings.  On initial assessment, patient resting on ED stretcher.  He endorses continued severe headache pain that seems to be localized in his left periorbital area.  He denies any temporal tenderness.  He has no focal neurologic deficits.  He is alert and oriented.  Patient's niece accompanies him at bedside and assists with providing history and translation.  Patient declines use of [employer] interpreter.  At baseline, patient is blind.  IOP's were checked.  Patient had IOP of 22 on the right and 24 on the left.  Current vital signs are notable for hypertension in the range of 180s over 90s.  Patient did have a prolonged wait time in the ED waiting room and has missed his blood pressure medicines today.  Patient was ordered home blood pressure medications in addition to GI cocktail.  ESR and CRP were checked.  These were in a normal range and I do not suspect GCA.  On reassessment, patient is sleeping.  Blood pressure has improved.  On further reassessment, patient awake and endorsing remarkable improvement in his headache.  At this point, blood pressure is 140/75.  Patient and niece do feel comfortable with discharge home for patient to continue resting.  He was prescribed Reglan, to be taken as needed, for recurrence of headache.  He was also encouraged to return to the ED for any worsening symptoms.  He was discharged in good condition. I ordered  medication including Tylenol, Benadryl, Decadron, Reglan for headache; hydralazine, carvedilol, amlodipine for hypertension Reevaluation of the patient after these medicines showed that the patient resolved I have reviewed the patients home medicines and have made adjustments as needed   Social Determinants of Health:  Has access to outpatient care        Final Clinical Impression(s) / ED Diagnoses Final diagnoses:  Bad headache    Rx / DC Orders ED Discharge Orders          Ordered    metoCLOPramide (REGLAN) 10 MG tablet  Every 8 hours PRN        05/18/22 2249              Godfrey Pick, MD 05/18/22 2252

## 2022-05-18 NOTE — ED Notes (Signed)
Discharge instructions on AVS were discussed with pt and niece/caregiver at bedside. Pt/caregiver were able to verbalize understanding with no questions at this time. Pt wheelchair to lobby with niece to await ride home.

## 2022-05-18 NOTE — ED Provider Triage Note (Signed)
Emergency Medicine Provider Triage Evaluation Note  Eric Glenn , a 56 y.o. male  was evaluated in triage.  Pt complains of headache.  Is been intermittent for 2 weeks.  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  Dialysis Tuesday Thursday Saturday..  Review of Systems  Per HPI  Physical Exam  BP (!) 185/83 (BP Location: Left Arm)   Pulse 67   Temp 98 F (36.7 C) (Oral)   Resp 17   Ht 5\' 4"  (1.626 m)   Wt 90.7 kg   SpO2 98%   BMI 34.33 kg/m  Gen:   Awake, no distress   Resp:  Normal effort  MSK:   Moves extremities without difficulty Other:  AV fistula right upper extremity.  Follows commands.  Nonfocal  Medical Decision Making  Medically screening exam initiated at 7:26 AM.  Appropriate orders placed.  Naomi [name] was informed that the remainder of the evaluation will be completed by another provider, this initial triage assessment does not replace that evaluation, and the importance of remaining in the ED until their evaluation is complete.     Sherrill Raring, [place] 05/18/22 [phone]

## 2022-05-18 NOTE — Discharge Instructions (Addendum)
Continue to take your home medications as prescribed.  A prescription for a new medication called metoclopramide was sent to your pharmacy.  This is a medication that treats headaches and nausea.  Take this only as needed.  Continue to take Tylenol at home as needed.  Return to the emergency department for any new or worsening symptoms of concern.

## 2022-05-19 ENCOUNTER — Encounter (HOSPITAL_COMMUNITY): Payer: Self-pay | Admitting: Nephrology

## 2022-05-19 ENCOUNTER — Other Ambulatory Visit (HOSPITAL_COMMUNITY): Payer: Self-pay

## 2022-05-21 ENCOUNTER — Other Ambulatory Visit: Payer: Self-pay

## 2022-05-21 ENCOUNTER — Encounter: Payer: Self-pay | Admitting: Student

## 2022-05-21 ENCOUNTER — Other Ambulatory Visit (HOSPITAL_COMMUNITY): Payer: Self-pay

## 2022-05-21 ENCOUNTER — Ambulatory Visit: Payer: Self-pay | Admitting: Student

## 2022-05-21 ENCOUNTER — Encounter (HOSPITAL_COMMUNITY): Payer: Self-pay | Admitting: Nephrology

## 2022-05-21 VITALS — BP 177/85 | HR 70 | Temp 98.1°F | Ht 64.0 in | Wt 221.5 lb

## 2022-05-21 DIAGNOSIS — Z794 Long term (current) use of insulin: Secondary | ICD-10-CM

## 2022-05-21 DIAGNOSIS — I10 Essential (primary) hypertension: Secondary | ICD-10-CM

## 2022-05-21 DIAGNOSIS — E1169 Type 2 diabetes mellitus with other specified complication: Secondary | ICD-10-CM

## 2022-05-21 DIAGNOSIS — G43719 Chronic migraine without aura, intractable, without status migrainosus: Secondary | ICD-10-CM

## 2022-05-21 DIAGNOSIS — Z87891 Personal history of nicotine dependence: Secondary | ICD-10-CM

## 2022-05-21 DIAGNOSIS — R42 Dizziness and giddiness: Secondary | ICD-10-CM

## 2022-05-21 LAB — POCT GLYCOSYLATED HEMOGLOBIN (HGB A1C): Hemoglobin A1C: 9.8 % — AB (ref 4.0–5.6)

## 2022-05-21 LAB — GLUCOSE, CAPILLARY: Glucose-Capillary: 155 mg/dL — ABNORMAL HIGH (ref 70–99)

## 2022-05-21 MED ORDER — TOPIRAMATE 25 MG PO TABS
25.0000 mg | ORAL_TABLET | Freq: Every day | ORAL | 1 refills | Status: DC
  Filled 2022-05-21: qty 30, 15d supply, fill #0

## 2022-05-21 NOTE — Assessment & Plan Note (Signed)
Patient complaining of dizziness for the past 2 months. He states that dizziness is positional in nature, typically occurring when going from a lying position to sitting or from sitting to standing. He feels like his head is spinning and has to balance himself for about 1-2 minutes before the dizziness abates. He began noting dizziness with his dialysis sessions, but now he has been experiencing symptoms even on non-HD days. Fortunately, no recent falls, new hearing deficits, ataxia noted. Orthostatic vitals in the clinic today were 163/76 (laying), 154/74 (sitting), and 147/70 (standing). Readings are borderline for orthostasis but along with patient's history, I strongly suspect that his dizziness is 2/2 orthostasis. With borderline orthostatic vitals at this time, I anticipate that he would have positive orthostatics if taken following HD. Furthermore, patient is on carvedilol which may be blunting his sympathetic response to positional changes. I have reached out to Dr. Joelyn Oms to ascertain whether patient's estimated "dry weight" needs to be increased to avoid precipitating a low-volume state and also to see if patient can be transitioned from coreg to a non-BB antihypertensive such as imdur.  Plan: -awaiting to hear back from Dr. Joelyn Oms [facility]) -f/u in 1 week

## 2022-05-21 NOTE — Progress Notes (Signed)
   CC: f/u headache, dizziness  HPI:  [name] is a 56 y.o. male with history listed below presenting to the [facility] for f/u of headache and dizziness. Please see individualized problem based charting for full HPI.  Past Medical History:  Diagnosis Date   Anemia    Diabetes ([place])    Diabetes mellitus type 2, controlled ([place]) 08/04/2015   Diabetic retinopathy ([place])    Diabetic retinopathy ([place])    Dialysis patient ([place])    Diarrhea    ESRD (end stage renal disease) ([place]) 03/10/2016   High cholesterol    Hypertension    Nausea 08/2017    Review of Systems:  Negative aside from that listed in individualized problem based charting.  Physical Exam:  Vitals:   05/21/22 0915  BP: (!) 177/85  Pulse: 70  Temp: 98.1 F (36.7 C)  TempSrc: Oral  SpO2: 98%  Weight: 221 lb 8 oz (100.5 kg)  Height: 5\' 4"  (1.626 m)   Physical Exam Constitutional:      Appearance: He is obese. He is not ill-appearing.  HENT:     Head: Normocephalic and atraumatic.     Mouth/Throat:     Mouth: Mucous membranes are moist.     Pharynx: Oropharynx is clear.  Eyes:     Comments: Legally blind   Cardiovascular:     Rate and Rhythm: Normal rate and regular rhythm.     Heart sounds: Normal heart sounds.  Pulmonary:     Effort: Pulmonary effort is normal.     Breath sounds: Normal breath sounds. No wheezing, rhonchi or rales.  Abdominal:     General: Bowel sounds are normal. There is no distension.     Palpations: Abdomen is soft.     Tenderness: There is no abdominal tenderness.  Musculoskeletal:        General: Normal range of motion.     Comments: 0-1+ pitting edema in BLE  Skin:    General: Skin is warm and dry.  Neurological:     Mental Status: He is alert. Mental status is at baseline.  Psychiatric:        Mood and Affect: Mood normal.        Behavior: Behavior normal.      Assessment & Plan:   See Encounters Tab for problem based charting.  Patient discussed with Dr.   Saverio Danker

## 2022-05-21 NOTE — Assessment & Plan Note (Addendum)
Patient reports long-standing history of intermittent headaches, however has had worsening over the past couple of months. States that he always had headaches but began noticing progressive worsening with dialysis sessions as of about 2-3 months ago. His headaches progressively worsened over these past couple of months to now occurring daily, even on non-HD days. His headaches are manifested by intense pain in the L periorbital area (intermittently wraps around to L posterior head) with associated photophobia and nausea. He does endorse dizziness, but this is likely due to a separate etiology (see "dizziness" problem). Although vestibular migraine was considered, it is less likely as dizziness occurs even in the absence of headache.   Patient appears to have a chronic migraine that is intractable. His quality of life is significantly impacted by this as his family mentions he has been staying in a dark room for days and has been unable to sleep at night due to headaches. He found little benefit with tylenol prn. He received an injection of imitrex in the ED for this, but that partially relieved symptoms only for about half an hour. Patient is currently uninsured and thus options are limited in regards to management. Furthermore, given patient's ESRD status and uncontrolled arterial HTN, I am hesitant to initiate triptan plus naproxen therapy for abortive management. I will prescribe topamax prophylaxis at this time and have reached out to Dr. Joelyn Oms ([place]) to discuss potential options for migraine management. I also believe that patient's uncontrolled HTN is likely playing a role in constant headaches and thus more optimal BP management is likely needed.  Plan: -topamax prophylaxis 25mg  daily for 1 week, then 25mg  BID -reached out to Dr. Joelyn Oms about migraine pharmacotherapy in the setting of ESRD and for BP management -f/u in 1 week

## 2022-05-21 NOTE — Patient Instructions (Signed)
Eric Glenn,  It was a pleasure seeing you in the clinic today.   I have prescribed a medicine called topiramate to help with your headaches. Please take one tablet a day for 1 week, then start taking one tablet twice a day (once in the morning and once at night). I will be reaching out to your kidney doctor to figure out the best next steps to help with your blood pressure, headaches, and dizziness. I will give you a call once I have more information. Please come back to see [name] in 1 week.  Please call our clinic at [phone] if you have any questions or concerns. The best time to call is Monday-Friday from 9am-4pm, but there is someone available 24/7 at the same number. If you need medication refills, please notify your pharmacy one week in advance and they will send [name] a request.   Thank you for letting us take part in your care. We look forward to seeing you next time!

## 2022-05-21 NOTE — Assessment & Plan Note (Signed)
Patient living with uncontrolled HTN, currently taking norvasc 10mg  daily, hydralazine 100mg  TID, and coreg 12.5mg  BID. BP is elevated today at 177/85, but has not taken his morning antihypertensives today. His orthostatic vitals were better with a sitting BP of 154/74 and standing BP of 147/70. Patient's goal BP given ESRD and comorbid conditions is likely <150/90. I have reached out to Dr. Joelyn Oms to see if we can transition patient from coreg to another non-BB antihypertensive given that patient is experiencing progressive dizziness likely 2/2 orthostasis, awaiting response.  Plan: -continue norvasc, hydralazine, coreg -reached out to Dr. Joelyn Oms about changing coreg to different antihypertensive -f/u in 1 week

## 2022-05-21 NOTE — Assessment & Plan Note (Signed)
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 He is currently taking lantus 30u BID. A1c today of 9.8%, which is increased from 9.1% in April 2023. Patient's family helps him with his insulin as patient is unable to check his own blood sugars or insulin doses given impaired vision. There was concern during prior visits that patient's was experiencing hypoglycemic events, especially after HD. Therefore, plan was made to allow for a higher A1c goal given ESRD and multiple comorbid conditions, and to avoid precipitating hypoglycemia. Will avoid adjusting his home diabetic medications at this time.  Plan: -continue lantus 30u BID -repeat A1c in 3 months

## 2022-05-24 ENCOUNTER — Other Ambulatory Visit (HOSPITAL_COMMUNITY): Payer: Self-pay

## 2022-05-24 ENCOUNTER — Encounter (HOSPITAL_COMMUNITY): Payer: Self-pay | Admitting: Nephrology

## 2022-05-27 NOTE — Progress Notes (Signed)
Internal Medicine Clinic Attending  Case discussed with Dr. Jinwala  At the time of the visit.  We reviewed the resident's history and exam and pertinent patient test results.  I agree with the assessment, diagnosis, and plan of care documented in the resident's note.  

## 2022-05-28 ENCOUNTER — Encounter: Payer: Self-pay | Admitting: Student

## 2022-05-28 ENCOUNTER — Other Ambulatory Visit: Payer: Self-pay

## 2022-05-28 ENCOUNTER — Other Ambulatory Visit (HOSPITAL_COMMUNITY): Payer: Self-pay

## 2022-05-28 ENCOUNTER — Ambulatory Visit (INDEPENDENT_AMBULATORY_CARE_PROVIDER_SITE_OTHER): Payer: Self-pay | Admitting: Student

## 2022-05-28 VITALS — BP 187/74 | HR 65 | Temp 98.2°F | Ht 64.0 in | Wt 216.2 lb

## 2022-05-28 DIAGNOSIS — I10 Essential (primary) hypertension: Secondary | ICD-10-CM

## 2022-05-28 DIAGNOSIS — R42 Dizziness and giddiness: Secondary | ICD-10-CM

## 2022-05-28 DIAGNOSIS — G43719 Chronic migraine without aura, intractable, without status migrainosus: Secondary | ICD-10-CM

## 2022-05-28 DIAGNOSIS — Z87891 Personal history of nicotine dependence: Secondary | ICD-10-CM

## 2022-05-28 MED ORDER — TOPIRAMATE 25 MG PO TABS
25.0000 mg | ORAL_TABLET | Freq: Two times a day (BID) | ORAL | 1 refills | Status: DC
  Filled 2022-05-28 – 2022-06-11 (×2): qty 30, 15d supply, fill #0

## 2022-05-28 NOTE — Patient Instructions (Signed)
Mr. Prabhu,  It was a pleasure seeing you in the clinic today.   Your blood pressure is high today so make sure to take your blood pressure medicines when you get home. For the headache medicine (Topiramate), please start taking 1 tablet in the morning and 1 tablet at night EXCEPT on dialysis days (only take 1 tablet after dialysis on dialysis days). Please make sure to schedule a visit with [facility] soon to talk to them about his blood pressure, dizziness, and headaches. Please bring the most updated information from them back to our clinic so we all can be on the same page. Please follow up in 3 weeks.  Please call our clinic at [phone] if you have any questions or concerns. The best time to call is Monday-Friday from 9am-4pm, but there is someone available 24/7 at the same number. If you need medication refills, please notify your pharmacy one week in advance and they will send [name] a request.   Thank you for letting us take part in your care. We look forward to seeing you next time!

## 2022-05-28 NOTE — Assessment & Plan Note (Addendum)
Patient with uncontrolled HTN, currently taking norvasc 10mg  daily, hydralazine 100mg  TID, and coreg 12.5mg  BID. BP is elevated today at 187/74. Similar to last visit, he did not take his antihypertensives this morning as his daughter (who helps with medications) was at a physician appointment. I spoke with Dr. Joelyn Oms via Epic message and he recommended having them follow up at [facility]. I advised daughter to schedule appointment with them as nephrology will be able to manage patient's HTN along with possible orthostasis. Dr. Joelyn Oms noted that patient has not been orthostatic positive consistently at dialysis as they assess this each session. He also relayed that he would likely consider changing hydralazine and possibly starting an ARB instead for BP control if needed.  Plan: -continue current medications for now -f/u with [place] for HTN and possible orthostasis -f/u at [facility] in 3 weeks

## 2022-05-28 NOTE — Progress Notes (Signed)
   CC: f/u HTN, migraines  HPI:  Eric Glenn is a 56 y.o. Male with history listed below presenting to the [facility] for f/u HTN, migraines. Please see individualized problem based charting for full HPI.  Past Medical History:  Diagnosis Date   Anemia    Diabetes ([place])    Diabetes mellitus type 2, controlled ([place]) 08/04/2015   Diabetic retinopathy ([place])    Diabetic retinopathy ([place])    Dialysis patient ([place])    Diarrhea    ESRD (end stage renal disease) ([place]) 03/10/2016   High cholesterol    Hypertension    Nausea 08/2017    Review of Systems:  Negative aside from that listed in individualized problem based charting.  Physical Exam:  Vitals:   05/28/22 0933  BP: (!) 187/74  Pulse: 65  Temp: 98.2 F (36.8 C)  TempSrc: Oral  SpO2: 98%  Weight: 216 lb 3.7 oz (98.1 kg)  Height: 5\' 4"  (1.626 m)   Physical Exam Constitutional:      Appearance: He is obese. He is not ill-appearing.  HENT:     Mouth/Throat:     Mouth: Mucous membranes are moist.     Pharynx: Oropharynx is clear.  Eyes:     Comments: Legally blind  Cardiovascular:     Rate and Rhythm: Normal rate and regular rhythm.     Pulses: Normal pulses.     Heart sounds: Normal heart sounds.  Pulmonary:     Effort: Pulmonary effort is normal.     Breath sounds: Normal breath sounds. No wheezing, rhonchi or rales.  Abdominal:     General: Bowel sounds are normal. There is no distension.     Palpations: Abdomen is soft.     Tenderness: There is no abdominal tenderness.  Musculoskeletal:        General: Normal range of motion.     Comments: Trace pitting edema in bilateral LE.  Skin:    General: Skin is warm and dry.  Neurological:     Mental Status: He is alert and oriented to person, place, and time. Mental status is at baseline.      Assessment & Plan:   See Encounters Tab for problem based charting.  Patient discussed with Dr. Heber [place]

## 2022-05-28 NOTE — Assessment & Plan Note (Signed)
Please see note from 05/21/2022 for full details. Patient has been finding some benefit from topamax prophylaxis but has not experienced complete resolution of his symptoms. We discussed increasing topamax dosing to 25mg  BID aside from on dialysis days where he will take 25mg  daily (take dose after HD).  Plan: -increased topamax to 25mg  BID (except HD days where he will take 25mg  once after HD) -f/u in 3 weeks

## 2022-05-28 NOTE — Assessment & Plan Note (Signed)
Please see note from 05/21/2022 for full details. Patient's daughter to schedule appointment at [facility] to discuss orthostasis and HTN management with nephrology.

## 2022-06-07 NOTE — Progress Notes (Signed)
Internal Medicine Clinic Attending  Case discussed with the resident at the time of the visit.  We reviewed the resident's history and exam and pertinent patient test results.  I agree with the assessment, diagnosis, and plan of care documented in the resident's note.  

## 2022-06-11 ENCOUNTER — Other Ambulatory Visit (HOSPITAL_COMMUNITY): Payer: Self-pay

## 2022-06-11 ENCOUNTER — Encounter (HOSPITAL_COMMUNITY): Payer: Self-pay | Admitting: Nephrology

## 2022-06-18 ENCOUNTER — Encounter: Payer: Self-pay | Admitting: Internal Medicine

## 2022-06-18 ENCOUNTER — Other Ambulatory Visit (HOSPITAL_COMMUNITY): Payer: Self-pay

## 2022-06-18 ENCOUNTER — Encounter (HOSPITAL_COMMUNITY): Payer: Self-pay | Admitting: Nephrology

## 2022-06-18 ENCOUNTER — Ambulatory Visit (INDEPENDENT_AMBULATORY_CARE_PROVIDER_SITE_OTHER): Payer: Self-pay | Admitting: Internal Medicine

## 2022-06-18 VITALS — BP 128/59 | HR 61 | Temp 98.0°F | Wt 210.5 lb

## 2022-06-18 DIAGNOSIS — I10 Essential (primary) hypertension: Secondary | ICD-10-CM

## 2022-06-18 DIAGNOSIS — G43719 Chronic migraine without aura, intractable, without status migrainosus: Secondary | ICD-10-CM

## 2022-06-18 DIAGNOSIS — Z87891 Personal history of nicotine dependence: Secondary | ICD-10-CM

## 2022-06-18 MED ORDER — TOPIRAMATE 25 MG PO TABS
25.0000 mg | ORAL_TABLET | Freq: Two times a day (BID) | ORAL | 3 refills | Status: DC
  Filled 2022-06-18 – 2022-06-25 (×2): qty 30, 15d supply, fill #0
  Filled 2022-07-21: qty 30, 15d supply, fill #1

## 2022-06-18 MED ORDER — ONDANSETRON HCL 4 MG PO TABS
4.0000 mg | ORAL_TABLET | Freq: Three times a day (TID) | ORAL | 1 refills | Status: AC | PRN
  Filled 2022-06-18: qty 30, 10d supply, fill #0

## 2022-06-18 NOTE — Patient Instructions (Signed)
Thank you, Mr.Drago [name] for allowing [name] to provide your care today. Today we discussed:  Ear doctor: [place] [facility] [address]  [place], [place] [address]  [phone]   2. Contine tomando topamax dos veces al da para sus dolores de [place] y solo una vez al da [place] de dilisis. - Asegrese de hacer un seguimiento con el neurlogo/mdico del cerebro en enero para sus dolores de cabeza.  [place], DEJE de tomar reglan y, en su lugar, puede comenzar a tomar zofran cada 8 horas, segn sea necesario.  I have ordered the following labs for you:  Lab Orders  No laboratory test(s) ordered today     Referrals ordered today:   Referral Orders  No referral(s) requested today     I have ordered the following medication/changed the following medications:   Stop the following medications: Medications Discontinued During This Encounter  Medication Reason   metoCLOPramide (REGLAN) 5 MG tablet      Start the following medications: Meds ordered this encounter  Medications   ondansetron (ZOFRAN) 4 MG tablet    Sig: Take 1 tablet (4 mg total) by mouth every 8 (eight) hours as needed for nausea or vomiting.    Dispense:  30 tablet    Refill:  1     Follow up: 2 months para la diabetes   Should you have any questions or concerns please call the internal medicine clinic at [phone].     Buddy Duty, D.O. [place]

## 2022-06-18 NOTE — Assessment & Plan Note (Addendum)
The patient presents to the [facility] for 1 month follow-up of his hypertension.  At the last visit, his blood pressure was significantly elevated at 187/74.  He is currently taking amlodipine 10 mg daily, hydralazine 100 mg 3 times daily, and Coreg 12.5 mg twice daily.  The patient's blood pressure medications are primarily managed by his nephrologist, and the patient has been following with [facility].  Today, his blood pressure is improved to 128/59. Nephrology did not change any of his BP medications.  He has a headache, but suffers from migraines and states that this is the usual headache that he gets.  He denies any dizziness, lightheadedness, chest pain, shortness of breath, or lower extremity edema.  Plan: -Continue amlodipine, hydralazine, and Coreg -Antihypertensives primarily managed by nephrology

## 2022-06-18 NOTE — Assessment & Plan Note (Addendum)
The patient is a long standing history of intermittent headaches, that have been worsening with dialysis sessions.  His headaches are manifested by intense pain around the left periorbital region with associated photophobia and nausea.  The patient was previously taking Reglan for his nausea, but notes that this is not helping as much anymore.  The patient was started on topiramate, which she states is helping his headaches improved some, but they are not completely relieved.  He takes Topamax 25 mg bid except HD days, where takes one tablet (25 mg) after HD.  Because of his migraines, the patient is made an appointment to see a neurologist on January 17.  Plan: -Continue Topamax -Switch Reglan to Zofran every 8 hours as needed -Appt with neuro 1/17

## 2022-06-18 NOTE — Progress Notes (Signed)
CC: HTN f/u  HPI:  [name] is a 56 y.o. Spanish speaking male living with a history stated below and presents today for a follow up of hypertension. His niece accompanied him at this visit and assist with translating. Please see problem based assessment and plan for additional details.  Past Medical History:  Diagnosis Date   Anemia    Diabetes ([place])    Diabetes mellitus type 2, controlled ([place]) 08/04/2015   Diabetic retinopathy ([place])    Diabetic retinopathy ([place])    Dialysis patient ([place])    Diarrhea    ESRD (end stage renal disease) ([place]) 03/10/2016   High cholesterol    Hypertension    Nausea 08/2017    Current Outpatient Medications on File Prior to Visit  Medication Sig Dispense Refill   acetaminophen (TYLENOL) 325 MG tablet Take 325-650 mg by mouth every 6 (six) hours as needed for mild pain or headache.     albuterol (VENTOLIN HFA) 108 (90 Base) MCG/ACT inhaler Inhale 1-2 puffs into the lungs every 6 (six) hours as needed for wheezing or shortness of breath. 18 g 0   amLODipine (NORVASC) 10 MG tablet Take 1 tablet (10 mg total) by mouth daily. 90 tablet 3   atorvastatin (LIPITOR) 40 MG tablet TAKE 1 TABLET (40 MG TOTAL) BY MOUTH DAILY. 30 tablet 3   carvedilol (COREG) 12.5 MG tablet Take 1 tablet (12.5 mg total) by mouth 2 (two) times daily. 60 tablet 3   Continuous Blood Gluc Sensor (FREESTYLE LIBRE 3 SENSOR) MISC Place 1 sensor on the skin every 14 days and use to check glucose continuously. 2 each 10   glucose blood (CONTOUR NEXT TEST) test strip Test blood glucose 4 times daily 100 each 11   hydrALAZINE (APRESOLINE) 100 MG tablet Take 1 tablet (100 mg total) by mouth 3 (three) times daily. 90 tablet 3   insulin glargine (LANTUS SOLOSTAR) 100 UNIT/ML Solostar Pen Inject 30 Units into the skin 2 (two) times daily. 21 mL 6   Insulin Pen Needle 32G X 4 MM MISC USE AS DIRECTED TWICE DAILY. 100 each 12   Lancets MISC Test blood glucose 4 times daily 100 each  11   meclizine (ANTIVERT) 25 MG tablet Take 1 tablet (25 mg total) by mouth 2 (two) times daily as needed for vertigo/dizziness 60 tablet 6   multivitamin (RENA-VIT) TABS tablet Take 1 tablet by mouth daily. 60 tablet 5   Omega-3 Fatty Acids (FISH OIL) 1000 MG CAPS Take 1,000 mg by mouth in the morning and at bedtime.     senna-docusate (SENOKOT S) 8.6-50 MG tablet Take 1 tablet by mouth daily. 30 tablet 0   No current facility-administered medications on file prior to visit.    Family History  Problem Relation Age of Onset   Hypertension Mother    Hyperlipidemia Mother    Diabetes Mellitus II Sister     Social History   Socioeconomic History   Marital status: Single    Spouse name: Not on file   Number of children: Not on file   Years of education: Not on file   Highest education level: Not on file  Occupational History   Not on file  Tobacco Use   Smoking status: Former    Types: Cigarettes    Quit date: 10/22/2003    Years since quitting: 18.6   Smokeless tobacco: Never  Vaping Use   Vaping Use: Never used  Substance and Sexual Activity   Alcohol use:  No    Alcohol/week: 0.0 standard drinks of alcohol   Drug use: No   Sexual activity: Not on file  Other Topics Concern   Not on file  Social History Narrative   Not on file   Social Determinants of Health   Financial Resource Strain: Not on file  Food Insecurity: No Food Insecurity (05/28/2022)   Hunger Vital Sign    Worried About Running Out of Food in the Last Year: Never true    Ran Out of Food in the Last Year: Never true  Transportation Needs: Not on file  Physical Activity: Not on file  Stress: Not on file  Social Connections: Moderately Isolated (05/28/2022)   Social Connection and Isolation Panel [NHANES]    Frequency of Communication with Friends and Family: More than three times a week    Frequency of Social Gatherings with Friends and Family: More than three times a week    Attends Religious  Services: Never    Marine scientist or Organizations: Yes    Attends Archivist Meetings: Never    Marital Status: Never married  Intimate Partner Violence: Not At Risk (05/28/2022)   Humiliation, Afraid, Rape, and Kick questionnaire    Fear of Current or Ex-Partner: No    Emotionally Abused: No    Physically Abused: No    Sexually Abused: No    Review of Systems: ROS negative except for what is noted on the assessment and plan.  Vitals:   06/18/22 1001  BP: (!) 128/59  Pulse: 61  Temp: 98 F (36.7 C)  TempSrc: Oral  SpO2: 97%  Weight: 210 lb 8 oz (95.5 kg)    Physical Exam: Constitutional: obese male, sitting in wheelchair, in no acute distress Eyes: legally blind Cardiovascular: regular rate and rhythm, no m/r/g Pulmonary/Chest: normal work of breathing on room air, lungs clear to auscultation bilaterally MSK: normal bulk and tone, trace edema bilateral LE Neurological: alert & oriented x 3, no focal deficit Skin: warm and dry Psych: normal mood and behavior  Assessment & Plan:     Patient discussed with Dr. Angelia Mould  Essential hypertension The patient presents to the [facility] for 1 month follow-up of his hypertension.  At the last visit, his blood pressure was significantly elevated at 187/74.  He is currently taking amlodipine 10 mg daily, hydralazine 100 mg 3 times daily, and Coreg 12.5 mg twice daily.  The patient's blood pressure medications are primarily managed by his nephrologist, and the patient has been following with [facility].  Today, his blood pressure is improved to 128/59. Nephrology did not change any of his BP medications.  He has a headache, but suffers from migraines and states that this is the usual headache that he gets.  He denies any dizziness, lightheadedness, chest pain, shortness of breath, or lower extremity edema.  Plan: -Continue amlodipine, hydralazine, and Coreg -Antihypertensives primarily managed by  nephrology  Chronic migraine without aura, intractable, without status migrainosus The patient is a long standing history of intermittent headaches, that have been worsening with dialysis sessions.  His headaches are manifested by intense pain around the left periorbital region with associated photophobia and nausea.  The patient was previously taking Reglan for his nausea, but notes that this is not helping as much anymore.  The patient was started on topiramate, which she states is helping his headaches improved some, but they are not completely relieved.  He takes Topamax 25 mg bid except HD days, where  takes one tablet (25 mg) after HD.  Because of his migraines, the patient is made an appointment to see a neurologist on January 17.  Plan: -Continue Topamax -Switch Reglan to Zofran every 8 hours as needed -Appt with neuro 1/17    Coburn Knaus, D.O. [facility], PGY-2 Phone: [phone] Date 06/18/2022 Time 10:40 AM

## 2022-06-21 ENCOUNTER — Other Ambulatory Visit (HOSPITAL_COMMUNITY): Payer: Self-pay

## 2022-06-21 ENCOUNTER — Encounter (HOSPITAL_COMMUNITY): Payer: Self-pay | Admitting: Nephrology

## 2022-06-22 NOTE — Progress Notes (Signed)
Internal Medicine Clinic Attending  Case discussed with the resident at the time of the visit.  We reviewed the resident's history and exam and pertinent patient test results.  I agree with the assessment, diagnosis, and plan of care documented in the resident's note.  

## 2022-06-25 ENCOUNTER — Other Ambulatory Visit (HOSPITAL_COMMUNITY): Payer: Self-pay

## 2022-06-25 ENCOUNTER — Encounter (HOSPITAL_COMMUNITY): Payer: Self-pay | Admitting: Nephrology

## 2022-07-21 ENCOUNTER — Other Ambulatory Visit (HOSPITAL_COMMUNITY): Payer: Self-pay

## 2022-07-21 ENCOUNTER — Other Ambulatory Visit: Payer: Self-pay

## 2022-07-21 ENCOUNTER — Encounter (HOSPITAL_COMMUNITY): Payer: Self-pay | Admitting: Nephrology

## 2022-08-04 ENCOUNTER — Other Ambulatory Visit (HOSPITAL_COMMUNITY): Payer: Self-pay

## 2022-08-04 ENCOUNTER — Encounter: Payer: Self-pay | Admitting: Psychiatry

## 2022-08-04 ENCOUNTER — Ambulatory Visit (INDEPENDENT_AMBULATORY_CARE_PROVIDER_SITE_OTHER): Payer: Self-pay | Admitting: Psychiatry

## 2022-08-04 VITALS — BP 135/70 | HR 62 | Ht 64.0 in | Wt 213.0 lb

## 2022-08-04 DIAGNOSIS — G43719 Chronic migraine without aura, intractable, without status migrainosus: Secondary | ICD-10-CM

## 2022-08-04 MED ORDER — TOPIRAMATE 25 MG PO TABS
ORAL_TABLET | ORAL | 6 refills | Status: AC
  Filled 2022-08-04: qty 120, 30d supply, fill #0
  Filled 2022-12-02: qty 120, 30d supply, fill #1
  Filled 2023-02-17: qty 120, 30d supply, fill #2
  Filled 2023-04-05: qty 120, 30d supply, fill #3
  Filled 2023-06-09: qty 120, 30d supply, fill #4
  Filled 2023-07-15: qty 120, 30d supply, fill #5

## 2022-08-04 MED ORDER — REYVOW 50 MG PO TABS
50.0000 mg | ORAL_TABLET | ORAL | 5 refills | Status: AC | PRN
  Filled 2022-08-04: qty 12, 30d supply, fill #0

## 2022-08-04 NOTE — Patient Instructions (Addendum)
Increase Topamax: Take 1 tablet (25 mg) in the morning and 2 tablets (50 mg) at bedtime for one week. Then increase to 2 pills (50 mg) twice a day. Take 2 tablets after dialysis on TTS.  Start Lasmiditan [facility]) as needed for migraines. Take one pill at onset of migraine. Max dose 1 pill in 24 hours

## 2022-08-04 NOTE — Progress Notes (Signed)
Referring:  Rexene Agent, MD [address] [place],  [place] [address]  PCP: Dorethea Clan, DO  Neurology was asked to evaluate Eric Glenn, a 57 year old male for a chief complaint of headaches.  Our recommendations of care will be communicated by shared medical record.    CC:  headaches  History provided from self, niece  HPI:  Medical co-morbidities: HTN, asthma, DM2, ESRD on IHD, HLD  The patient presents for evaluation of headaches which began about 3 months ago. He does have a history of headaches prior to this, but they have not been this severe previously. Initially his headaches were constant for the first 2 months, and now they mostly occur 3 days per week when he has dialysis. They can last for 2 days at a time. Headaches are associated with photophobia, phonophobia, and nausea.  He was started on Topamax 25 mg BID 1.5 months ago, with slight improvement in his headache severity and frequency. He tolerates this well without side effects. Takes Tylenol as needed, which is not effective.   Headache History: Onset: 3 months ago Triggers: dialysis Location: left frontal, occiput Quality/Description: pressure Associated Symptoms:  Photophobia: he is blind 2/2 diabetic retinopathy, does have photophobia in the left eye  Phonophobia: yes  Nausea: yes Worse with activity?: yes Duration of headaches: 2 days  Headache days per month: 24 Headache free days per month: 6  Current Treatment: Abortive Tylenol  Preventative Topamax 25 mg BID (25 mg after dialysis on IHD days)  Prior Therapies                                 Methocarbamol 500 mg BID Carvedilol 12.5 mg BID Lisinopril 2.5 mg daily Topamax 25 mg BID (25 mg after dialysis on IHD days)  LABS: CBC    Component Value Date/Time   WBC 8.3 05/18/2022 0729   RBC 3.68 (L) 05/18/2022 0729   HGB 11.2 (L) 05/18/2022 0729   HGB 10.8 (L) 03/31/2020 1516   HCT 31.8 (L) 05/18/2022 0729   HCT 32.6 (L)  03/31/2020 1516   PLT 136 (L) 05/18/2022 0729   PLT 111 (L) 03/31/2020 1516   MCV 86.4 05/18/2022 0729   MCV 92 03/31/2020 1516   MCH 30.4 05/18/2022 0729   MCHC 35.2 05/18/2022 0729   RDW 12.5 05/18/2022 0729   RDW 14.3 03/31/2020 1516   LYMPHSABS 1.3 05/18/2022 0729   MONOABS 0.4 05/18/2022 0729   EOSABS 0.3 05/18/2022 0729   BASOSABS 0.1 05/18/2022 0729      Latest Ref Rng & Units 05/18/2022    7:29 AM 08/12/2021   10:25 AM 01/14/2021    1:34 AM  CMP  Glucose 70 - 99 mg/dL 208  243  145   BUN 6 - 20 mg/dL 62  57  24   Creatinine 0.61 - 1.24 mg/dL 10.97  8.10  6.73   Sodium 135 - 145 mmol/L 134  133  135   Potassium 3.5 - 5.1 mmol/L 4.2  4.5  4.1   Chloride 98 - 111 mmol/L 95  95  94   CO2 22 - 32 mmol/L 23   30   Calcium 8.9 - 10.3 mg/dL 9.3   8.8      IMAGING:  CTH 05/18/22: no acute intracranial process  Imaging independently reviewed on August 04, 2022   Current Outpatient Medications on File Prior to Visit  Medication Sig  Dispense Refill   acetaminophen (TYLENOL) 325 MG tablet Take 325-650 mg by mouth every 6 (six) hours as needed for mild pain or headache.     albuterol (VENTOLIN HFA) 108 (90 Base) MCG/ACT inhaler Inhale 1-2 puffs into the lungs every 6 (six) hours as needed for wheezing or shortness of breath. 18 g 0   amLODipine (NORVASC) 10 MG tablet Take 1 tablet (10 mg total) by mouth daily. 90 tablet 3   carvedilol (COREG) 12.5 MG tablet Take 1 tablet (12.5 mg total) by mouth 2 (two) times daily. 60 tablet 3   glucose blood (CONTOUR NEXT TEST) test strip Test blood glucose 4 times daily 100 each 11   hydrALAZINE (APRESOLINE) 100 MG tablet Take 1 tablet (100 mg total) by mouth 3 (three) times daily. 90 tablet 3   insulin glargine (LANTUS SOLOSTAR) 100 UNIT/ML Solostar Pen Inject 30 Units into the skin 2 (two) times daily. 21 mL 6   Lancets MISC Test blood glucose 4 times daily 100 each 11   meclizine (ANTIVERT) 25 MG tablet Take 1 tablet (25 mg total) by  mouth 2 (two) times daily as needed for vertigo/dizziness 60 tablet 6   multivitamin (RENA-VIT) TABS tablet Take 1 tablet by mouth daily. 60 tablet 5   Omega-3 Fatty Acids (FISH OIL) 1000 MG CAPS Take 1,000 mg by mouth in the morning and at bedtime.     senna-docusate (SENOKOT S) 8.6-50 MG tablet Take 1 tablet by mouth daily. 30 tablet 0   atorvastatin (LIPITOR) 40 MG tablet TAKE 1 TABLET (40 MG TOTAL) BY MOUTH DAILY. 30 tablet 3   Continuous Blood Gluc Sensor (FREESTYLE LIBRE 3 SENSOR) MISC Place 1 sensor on the skin every 14 days and use to check glucose continuously. 2 each 10   Insulin Pen Needle 32G X 4 MM MISC USE AS DIRECTED TWICE DAILY. 100 each 12   No current facility-administered medications on file prior to visit.     Allergies: Allergies  Allergen Reactions   Other Other (See Comments) and Rash    Raises blood pressure   Poractant Alfa Rash and Other (See Comments)    Raises blood pressure   Pork-Derived Products Rash and Other (See Comments)    Raises blood pressure   Penicillins Hives    Family History: Migraine or other headaches in the family:  mother and sisters have headaches Aneurysms in a first degree relative:  no Brain tumors in the family:  no Other neurological illness in the family:   no  Past Medical History: Past Medical History:  Diagnosis Date   Acute infective otitis externa, right    Anemia    Diabetes ([place])    Diabetes mellitus type 2, controlled ([place]) 08/04/2015   Diabetic retinopathy ([place])    Diabetic retinopathy ([place])    Dialysis patient ([place])    Diarrhea    ESRD (end stage renal disease) ([place]) 03/10/2016   High cholesterol    Hypertension    Nausea 08/2017   Ulcer of right foot, limited to breakdown of skin ([place]) 11/28/2019    Past Surgical History Past Surgical History:  Procedure Laterality Date   A/V FISTULAGRAM Right 08/12/2021   Procedure: A/V FISTULAGRAM;  Surgeon: Broadus John, MD;  Location: [facility];   Service: Cardiovascular;  Laterality: Right;   AV FISTULA PLACEMENT Right 09/04/2015   Procedure: ARTERIOVENOUS (AV) FISTULA CREATION;  Surgeon: Angelia Mould, MD;  Location: [place] [facility];  Service: Vascular;  Laterality: Right;  AV FISTULA PLACEMENT Right 01/22/2016   Procedure: RIGHT UPPER ARM BRACHIOCEPHALIC ARTERIOVENOUS (AV) FISTULA CREATION;  Surgeon: Angelia Mould, MD;  Location: [place];  Service: Vascular;  Laterality: Right;   IR AV DIALY SHUNT INTRO NEEDLE/INTRACATH INITIAL W/PTA/IMG RIGHT Right 03/01/2022   IR DIALY SHUNT INTRO NEEDLE/INTRACATH INITIAL W/IMG RIGHT Right 12/08/2018   IR DIALY SHUNT INTRO NEEDLE/INTRACATH INITIAL W/IMG RIGHT Right 08/18/2020   IR US GUIDE VASC ACCESS RIGHT  03/01/2022   LIGATION OF ARTERIOVENOUS  FISTULA Right 01/22/2016   Procedure: LIGATION OF RIGHT FOREARM ARTERIOVENOUS  FISTULA;  Surgeon: Angelia Mould, MD;  Location: [place];  Service: Vascular;  Laterality: Right;   PERIPHERAL VASCULAR BALLOON ANGIOPLASTY Right 08/12/2021   Procedure: PERIPHERAL VASCULAR BALLOON ANGIOPLASTY;  Surgeon: Broadus John, MD;  Location: [facility];  Service: Cardiovascular;  Laterality: Right;  upper arm PTA   PERIPHERAL VASCULAR CATHETERIZATION N/A 01/12/2016   Procedure: Fistulagram;  Surgeon: Angelia Mould, MD;  Location: [place] [facility];  Service: Cardiovascular;  Laterality: N/A;   UMBILICAL HERNIA REPAIR      Social History: Social History   Tobacco Use   Smoking status: Former    Types: Cigarettes    Quit date: 10/22/2003    Years since quitting: 18.7   Smokeless tobacco: Never  Vaping Use   Vaping Use: Never used  Substance Use Topics   Alcohol use: No    Alcohol/week: 0.0 standard drinks of alcohol   Drug use: No    ROS: Negative for fevers, chills. Positive for headaches. All other systems reviewed and negative unless stated otherwise in HPI.   Physical Exam:   Vital Signs: BP 135/70   Pulse 62   Ht 5\' 4"  (1.626  m)   Wt 213 lb (96.6 kg)   BMI 36.56 kg/m  GENERAL: well appearing,in no acute distress,alert SKIN:  Color, texture, turgor normal. No rashes or lesions HEAD:  Normocephalic/atraumatic. CV:  RRR RESP: Normal respiratory effort MSK: no tenderness to palpation over occiput, neck, or shoulders  NEUROLOGICAL: Mental Status: Alert, oriented to person, place and time,Follows commands Cranial Nerves: PERRL, he is blind at baseline with some light perception OS, extraocular movements intact, facial sensation intact, no facial droop or ptosis, hearing grossly intact, no dysarthria Motor: muscle strength 5/5 both upper and lower extremities Reflexes: 2+ throughout Sensation: intact to light touch all 4 extremities Coordination: Finger-to- nose-finger intact bilaterally Gait: normal-based   IMPRESSION: 57 year old male HTN, asthma, DM2, ESRD on IHD, HLD who presents for evaluation of headaches. His current headache pattern is most consistent with chronic migraine. He has had some improvement with Topamax for prevention. Will uptitrate dose up to 50 mg BID (50 mg after IHD on dialysis days). Rescue medication options are limited. Gepant safety has not been studied in IHD and triptans are contraindicated in uncontrolled HTN. Will start lasmiditan for migraine rescue.  PLAN: -Prevention: Increase Topamax to 25/50 x1 week, then to 50 mg BID. Take 50 mg after IHD on dialysis days -Rescue: Start lasmiditan 50 mg PRN -Next steps: consider Qulipta 10 mg daily or Botox for headache prevention   I spent a total of 42 minutes chart reviewing and counseling the patient. Headache education was done. Discussed treatment options including preventive and acute medications. Discussed medication side effects, adverse reactions and drug interactions. Written educational materials and patient instructions outlining all of the above were given.  Follow-up: 6 months   Genia Harold, MD 08/04/2022  1:36 PM

## 2022-08-12 ENCOUNTER — Other Ambulatory Visit (HOSPITAL_COMMUNITY): Payer: Self-pay

## 2022-08-12 ENCOUNTER — Other Ambulatory Visit: Payer: Self-pay

## 2022-08-12 ENCOUNTER — Encounter (HOSPITAL_COMMUNITY): Payer: Self-pay | Admitting: Nephrology

## 2022-08-20 ENCOUNTER — Encounter (HOSPITAL_COMMUNITY): Payer: Self-pay | Admitting: Nephrology

## 2022-08-20 ENCOUNTER — Ambulatory Visit: Payer: Self-pay

## 2022-08-20 ENCOUNTER — Other Ambulatory Visit (HOSPITAL_COMMUNITY): Payer: Self-pay

## 2022-08-20 VITALS — BP 121/63 | HR 60 | Temp 98.2°F | Wt 213.0 lb

## 2022-08-20 DIAGNOSIS — I10 Essential (primary) hypertension: Secondary | ICD-10-CM

## 2022-08-20 DIAGNOSIS — G43719 Chronic migraine without aura, intractable, without status migrainosus: Secondary | ICD-10-CM

## 2022-08-20 DIAGNOSIS — Z87891 Personal history of nicotine dependence: Secondary | ICD-10-CM

## 2022-08-20 DIAGNOSIS — E785 Hyperlipidemia, unspecified: Secondary | ICD-10-CM

## 2022-08-20 DIAGNOSIS — E1169 Type 2 diabetes mellitus with other specified complication: Secondary | ICD-10-CM

## 2022-08-20 LAB — POCT GLYCOSYLATED HEMOGLOBIN (HGB A1C): Hemoglobin A1C: 8.4 % — AB (ref 4.0–5.6)

## 2022-08-20 LAB — GLUCOSE, CAPILLARY: Glucose-Capillary: 291 mg/dL — ABNORMAL HIGH (ref 70–99)

## 2022-08-20 MED ORDER — INSULIN ASPART 100 UNIT/ML FLEXPEN
6.0000 [IU] | PEN_INJECTOR | Freq: Three times a day (TID) | SUBCUTANEOUS | 11 refills | Status: DC
  Filled 2022-08-20: qty 15, 83d supply, fill #0

## 2022-08-20 MED ORDER — NOVOLOG FLEXPEN 100 UNIT/ML ~~LOC~~ SOPN
6.0000 [IU] | PEN_INJECTOR | Freq: Three times a day (TID) | SUBCUTANEOUS | 11 refills | Status: DC
  Filled 2022-08-20: qty 6, 33d supply, fill #0
  Filled 2022-10-22: qty 6, 30d supply, fill #1

## 2022-08-20 NOTE — Assessment & Plan Note (Signed)
Patient has a history of HTN. Current medications include hydrALAZINE 100 mg TID, amLODipine 10 mg daily, carvedilol 12.5 mg BID. The patient's blood pressure medications are primarily managed by his nephrologist, and the patient has been following with [facility]. Patient states that they are compliant with these medications. Patient reports that he has not missed any dialysis sessions in the last month. Patient states that they do not check their BP regularly at home. Patient reports centralized chest pressure over the last month that he notices during his dialysis sessions. This pain is not radiating and does not worsen with exertion. He experiences chronic orthopnea SOB when wearing a mask. Patient denies lightheadedness or dizziness. Initial BP today is 155/70. Repeat BP is 121/63. Echo from 02/2019 demonstrates EF 60-65%. No lower extremity edema or crackles on exam today. Will allow nephrology to manage his volume status.    Plan: - Continue hydrALAZINE 100 mg TID, amLODipine 10 mg daily, carvedilol 12.5 mg BID - Continue f/u w/ nephrology

## 2022-08-20 NOTE — Assessment & Plan Note (Signed)
Patient has a history of HLD. Patient was previously taking atorvastatin 40 mg daily but has not taken this medication in several months. Lipid panel from 2 years ago WNL w/ exception of TG 296, HDL 23, and VLDL 44. LDL 23. Patient has filled out the orange card paperwork but is not yet approved.   Plan: - Repeat lipid panel at next visit to assess need for statin (once orange card approved)

## 2022-08-20 NOTE — Assessment & Plan Note (Signed)
Patient has a history of T2DM and ESRD. Patient was previously on Lantus 35 units twice daily and Trulicity 1.5 mg weekly, however, the patient was experiencing hypoglycemic episodes. Current medications include Lantus 30 units BID. Patient states that they are compliant with this medication. Patient does check their blood sugar at home regularly and notes values in the low 100s in the morning but occasionally increases to 200s-300s after meals. No lows at home. Patient denies polyuria, polydipsia, fatigue. Patient states that they do visit the ophthalmologist for yearly eye exams. A1c was 9.8% three months ago. A1c today 8.4%. Foot exam was performed today and documented in quality metrics.   Plan: - Continue Lantus 30 units BID - Start insulin aspart 6 units TID w/ meals - F/u opthalmology for yearly eye exams

## 2022-08-20 NOTE — Progress Notes (Signed)
CC: 2 month f/u  HPI:  [name] is a 56 y.o. male with past medical history of HTN, HLD, T2DM, ESRD on HD, chronic migraine, and obesity that presents for 2 month f/u.   Patient has a history of T2DM and ESRD. Current medications include Lantus 30 units BID. Patient states that they are compliant with this medication. Patient does check their blood sugar at home regularly and notes values in the low 100s in the morning but occasionally increases to 200s-300s after meals. No lows at home. Patient denies polyuria, polydipsia, fatigue. Patient states that they do visit the ophthalmologist for yearly eye exams.   Patient has a history of HTN. Current medications include hydrALAZINE 100 mg TID, amLODipine 10 mg daily, carvedilol 12.5 mg BID. The patient's blood pressure medications are primarily managed by his nephrologist, and the patient has been following with [facility]. Patient states that they are compliant with these medications. Patient reports that he has not missed any dialysis sessions in the last month. Patient states that they do not check their BP regularly at home. Patient reports centralized chest pressure over the last month that he notices during his dialysis sessions. This pain is not radiating and does not worsen with exertion. He experiences chronic orthopnea and SOB when wearing a mask. Patient denies lightheadedness or dizziness.   Patient has a history of HLD. Patient was previously taking atorvastatin 40 mg daily but has not taken this medication in several months. Lipid panel from 2 years ago WNL w/ exception of TG 296, HDL 23, and VLDL 44. LDL 23.     Patient last seen in clinic for chronic migraine on 12/1. Endorsed long-standing intermittent HA worsening w/ dialysis sessions. Pain is centered around the left periorbital region and associated w/ photophobia and nausea. Previously took Reglan for nausea which has not helped as much recently. He  was switched to zofran q8 hours PRN. Patient has been taking topiramate 25 mg BID (except for HD days where he only takes once after HD) which has helped some but did not completely resolve his headaches. Tylenol also does not help. Patient was seen by [facility] on 1/17. Plan was to increase topiramate to 25 mg in the morning and 50 mg at night for 1 week and then increase to 50 mg BID. Patient was also prescribed lasmiditan 50 mg PRN for rescue, however, the patient cannot afford this medication. Next steps were to consider Qulipta 10 mg daily vs botox injections for headache prevention. Patient is not scheduled to see neurology until 02/2023. He has not started to ramp up his topiramate yet as he wanted our clinics advice first.     Allergies as of 08/20/2022       Reactions   Other Other (See Comments), Rash   Raises blood pressure   Poractant Alfa Rash, Other (See Comments)   Raises blood pressure   Pork-derived Products Rash, Other (See Comments)   Raises blood pressure   Penicillins Hives        Medication List        Accurate as of August 20, 2022 11:48 AM. If you have any questions, ask your nurse or doctor.          acetaminophen 325 MG tablet Commonly known as: TYLENOL Take 325-650 mg by mouth every 6 (six) hours as needed for mild pain or headache.   albuterol 108 (90 Base) MCG/ACT inhaler Commonly known as: VENTOLIN HFA Inhale 1-2 puffs into  the lungs every 6 (six) hours as needed for wheezing or shortness of breath.   amLODipine 10 MG tablet Commonly known as: NORVASC Tome 1 tableta (10 mg en total) por va oral diariamente. (Take 1 tablet (10 mg total) by mouth daily.)   atorvastatin 40 MG tablet Commonly known as: LIPITOR TAKE 1 TABLET (40 MG TOTAL) BY MOUTH DAILY.   carvedilol 12.5 MG tablet Commonly known as: COREG Take 1 tablet (12.5 mg total) by mouth 2 (two) times daily.   Contour Next Test test strip Generic drug: glucose  blood Test blood glucose 4 times daily   Fish Oil 1000 MG Caps Take 1,000 mg by mouth in the morning and at bedtime.   FreeStyle Libre 3 Sensor Misc Place 1 sensor on the skin every 14 days and use to check glucose continuously.   hydrALAZINE 100 MG tablet Commonly known as: APRESOLINE Take 1 tablet (100 mg total) by mouth 3 (three) times daily.   Lancets Misc Test blood glucose 4 times daily   Lantus SoloStar 100 UNIT/ML Solostar Pen Generic drug: insulin glargine Inject 30 Units into the skin 2 (two) times daily.   meclizine 25 MG tablet Commonly known as: ANTIVERT Take 1 tablet (25 mg total) by mouth 2 (two) times daily as needed for vertigo/dizziness   multivitamin Tabs tablet Take 1 tablet by mouth daily.   NovoLOG FlexPen 100 UNIT/ML FlexPen Generic drug: insulin aspart Inject 6 Units into the skin 3 (three) times daily with meals. Started by: Starlyn Skeans, MD   Reyvow 50 MG Tabs Generic drug: Lasmiditan Succinate Take 1 tablet by mouth as needed (for migraine).   senna-docusate 8.6-50 MG tablet Commonly known as: Senokot S Take 1 tablet by mouth daily.   topiramate 25 MG tablet Commonly known as: TOPAMAX Take 1 tablet (25 mg) in the morning and 2 tablets (50 mg) at bedtime for one week. Then increase to 2 pills (50 mg) twice a day. Take 2 tablets after dialysis on TTS.   Unifine Pentips 32G X 4 MM Misc Generic drug: Insulin Pen Needle USE AS DIRECTED TWICE DAILY.         Past Medical History:  Diagnosis Date   Acute infective otitis externa, right    Anemia    Diabetes ([place])    Diabetes mellitus type 2, controlled ([place]) 08/04/2015   Diabetic retinopathy ([place])    Diabetic retinopathy ([place])    Dialysis patient ([place])    Diarrhea    ESRD (end stage renal disease) ([place]) 03/10/2016   High cholesterol    Hypertension    Nausea 08/2017   Ulcer of right foot, limited to breakdown of skin ([place]) 11/28/2019   Review of Systems:  per HPI.   Physical  Exam: Vitals:   08/20/22 0925 08/20/22 1030  BP: (!) 155/70 121/63  Pulse: 66 60  Temp: 98.2 F (36.8 C)   TempSrc: Oral   SpO2: 99%   Weight: 213 lb (96.6 kg)    Constitutional: Well-developed, well-nourished, appears comfortable  Cardiovascular: Regular rate, regular rhythm. No murmurs, rubs, or gallops. Normal radial and PT pulses bilaterally. No LE edema.  Pulmonary: Normal respiratory effort. No wheezes, rales, or rhonchi.   Abdominal: Soft. Non-distended. No tenderness. Normal bowel sounds.  Musculoskeletal: Normal range of motion.     Neurological: Alert and oriented to person, place, and time. Non-focal. Skin: warm and dry.    Assessment & Plan:   See Encounters Tab for problem based charting.  Patient seen with  Dr. Heber [place]

## 2022-08-20 NOTE — Patient Instructions (Addendum)
Thank you for coming to see Eric Glenn in clinic Mr. [name].  Plan: - Please continue taking lantus 30 units twice daily for your diabetes  - Please start taking insulin aspart 6 units three times daily with meals (if you do not have a normal to large size meal, you do not have to take use this medication with that meal)  - Please continue with the neurologists plan for increasing topiramate to 25 mg in the morning and 50 mg at night for 1 week followed by 50 mg twice daily (only once daily after hemodialysis on hemodialysis days)  - Please call back your neurologist office at [phone] to discuss alternatives to the lasmiditan that may be cheaper   - We would like to see you back in clinic in approximately 2 weeks if his headaches continue to persist (you should also call your neurologist to see if you can get an earlier appointment as they are more knowledgeable on this topic)  It was very nice to see you, thank you for allowing Eric Glenn to be involved in your care.   Please arrive to your next appointment 5-10 minutes before your scheduled appointment time, thank you!    --------------------------------------------------------------------------------------

## 2022-08-20 NOTE — Assessment & Plan Note (Signed)
Patient last seen in clinic for this on 12/1. Endorsed long-standing intermittent HA worsening w/ dialysis sessions. Pain is centered around the left periorbital region and associated w/ photophobia and nausea. Previously took Reglan for nausea which has not helped as much recently. He was switched to zofran q8 hours PRN. Patient has been taking topiramate 25 mg BID (except for HD days where he only takes once after HD) which has helped some but did not completely resolve his headaches. Tylenol also does not help. Patient was seen by [facility] on 1/17. Plan was to increase topiramate to 25 mg in the morning and 50 mg at night for 1 week and then increase to 50 mg BID. Patient was also prescribed lasmiditan 50 mg PRN for rescue, however, the patient cannot afford this medication. Next steps were to consider Qulipta 10 mg daily vs botox injections for headache prevention. Patient is not scheduled to see neurology until 02/2023. He has not started to ramp up his topiramate yet as he wanted our clinics advice first.    Plan: -Continue neurology plan to increase topiramate to 25 mg in the morning and 50 mg at night for 1 week and then increase to 50 mg BID (only 1 dose on HD days following HD) - Continue f/u w/ neurology (discussed that they can call the neurology clinic to discuss cheaper alternatives to lasmiditan and schedule f/u sooner)

## 2022-08-25 NOTE — Progress Notes (Signed)
Internal Medicine Clinic Attending  Case discussed with the resident at the time of the visit.  We reviewed the resident's history and exam and pertinent patient test results.  I agree with the assessment, diagnosis, and plan of care documented in the resident's note.  

## 2022-08-27 ENCOUNTER — Other Ambulatory Visit: Payer: Self-pay

## 2022-08-27 ENCOUNTER — Other Ambulatory Visit: Payer: Self-pay | Admitting: Student

## 2022-08-27 ENCOUNTER — Encounter (HOSPITAL_COMMUNITY): Payer: Self-pay | Admitting: Nephrology

## 2022-08-27 ENCOUNTER — Other Ambulatory Visit (HOSPITAL_COMMUNITY): Payer: Self-pay

## 2022-08-27 DIAGNOSIS — I10 Essential (primary) hypertension: Secondary | ICD-10-CM

## 2022-08-30 MED ORDER — CARVEDILOL 12.5 MG PO TABS
12.5000 mg | ORAL_TABLET | Freq: Two times a day (BID) | ORAL | 6 refills | Status: DC
  Filled 2022-08-30: qty 60, 30d supply, fill #0
  Filled 2022-10-06: qty 60, 30d supply, fill #1
  Filled 2022-11-05: qty 60, 30d supply, fill #2
  Filled 2022-12-10: qty 60, 30d supply, fill #3
  Filled 2023-01-18: qty 60, 30d supply, fill #4
  Filled 2023-02-17: qty 60, 30d supply, fill #5
  Filled 2023-03-24: qty 60, 30d supply, fill #6

## 2022-08-31 ENCOUNTER — Other Ambulatory Visit: Payer: Self-pay

## 2022-08-31 ENCOUNTER — Encounter (HOSPITAL_COMMUNITY): Payer: Self-pay | Admitting: Nephrology

## 2022-08-31 ENCOUNTER — Other Ambulatory Visit (HOSPITAL_COMMUNITY): Payer: Self-pay

## 2022-09-15 ENCOUNTER — Other Ambulatory Visit: Payer: Self-pay

## 2022-09-15 ENCOUNTER — Other Ambulatory Visit: Payer: Self-pay | Admitting: Internal Medicine

## 2022-09-15 DIAGNOSIS — E119 Type 2 diabetes mellitus without complications: Secondary | ICD-10-CM

## 2022-09-15 MED ORDER — LANTUS SOLOSTAR 100 UNIT/ML ~~LOC~~ SOPN
30.0000 [IU] | PEN_INJECTOR | Freq: Two times a day (BID) | SUBCUTANEOUS | 6 refills | Status: DC
  Filled 2022-09-15: qty 18, 30d supply, fill #0
  Filled 2022-10-22: qty 18, 30d supply, fill #1
  Filled 2022-11-23: qty 18, 30d supply, fill #2
  Filled 2023-01-06: qty 18, 30d supply, fill #3
  Filled 2023-02-16: qty 18, 30d supply, fill #4
  Filled 2023-03-29 – 2023-04-01 (×2): qty 18, 30d supply, fill #5

## 2022-09-16 ENCOUNTER — Encounter (HOSPITAL_COMMUNITY): Payer: Self-pay | Admitting: Nephrology

## 2022-09-16 ENCOUNTER — Other Ambulatory Visit (HOSPITAL_COMMUNITY): Payer: Self-pay

## 2022-09-16 ENCOUNTER — Other Ambulatory Visit: Payer: Self-pay

## 2022-09-24 ENCOUNTER — Other Ambulatory Visit (HOSPITAL_COMMUNITY): Payer: Self-pay

## 2022-09-24 ENCOUNTER — Encounter (HOSPITAL_COMMUNITY): Payer: Self-pay | Admitting: Nephrology

## 2022-09-24 ENCOUNTER — Other Ambulatory Visit: Payer: Self-pay | Admitting: Student

## 2022-09-24 DIAGNOSIS — I10 Essential (primary) hypertension: Secondary | ICD-10-CM

## 2022-09-24 MED ORDER — HYDRALAZINE HCL 100 MG PO TABS
100.0000 mg | ORAL_TABLET | Freq: Three times a day (TID) | ORAL | 3 refills | Status: DC
  Filled 2022-09-24: qty 90, 30d supply, fill #0
  Filled 2022-12-10: qty 90, 30d supply, fill #1
  Filled 2023-01-18: qty 90, 30d supply, fill #2
  Filled 2023-02-17: qty 90, 30d supply, fill #3

## 2022-09-25 ENCOUNTER — Other Ambulatory Visit (HOSPITAL_COMMUNITY): Payer: Self-pay

## 2022-09-27 ENCOUNTER — Other Ambulatory Visit: Payer: Self-pay

## 2022-10-06 ENCOUNTER — Encounter (HOSPITAL_COMMUNITY): Payer: Self-pay | Admitting: Nephrology

## 2022-10-06 ENCOUNTER — Other Ambulatory Visit: Payer: Self-pay

## 2022-10-06 ENCOUNTER — Other Ambulatory Visit (HOSPITAL_COMMUNITY): Payer: Self-pay

## 2022-10-20 ENCOUNTER — Other Ambulatory Visit: Payer: Self-pay

## 2022-10-20 ENCOUNTER — Other Ambulatory Visit (HOSPITAL_COMMUNITY): Payer: Self-pay

## 2022-10-20 ENCOUNTER — Encounter (HOSPITAL_COMMUNITY): Payer: Self-pay | Admitting: Nephrology

## 2022-10-22 ENCOUNTER — Other Ambulatory Visit (HOSPITAL_COMMUNITY): Payer: Self-pay

## 2022-10-22 ENCOUNTER — Encounter (HOSPITAL_COMMUNITY): Payer: Self-pay | Admitting: Nephrology

## 2022-11-05 ENCOUNTER — Encounter (HOSPITAL_COMMUNITY): Payer: Self-pay

## 2022-11-05 ENCOUNTER — Encounter (HOSPITAL_BASED_OUTPATIENT_CLINIC_OR_DEPARTMENT_OTHER): Payer: Self-pay | Admitting: Emergency Medicine

## 2022-11-05 ENCOUNTER — Ambulatory Visit (HOSPITAL_COMMUNITY): Payer: Self-pay

## 2022-11-05 ENCOUNTER — Ambulatory Visit (INDEPENDENT_AMBULATORY_CARE_PROVIDER_SITE_OTHER): Payer: Self-pay

## 2022-11-05 ENCOUNTER — Other Ambulatory Visit: Payer: Self-pay

## 2022-11-05 ENCOUNTER — Other Ambulatory Visit (HOSPITAL_COMMUNITY): Payer: Self-pay

## 2022-11-05 ENCOUNTER — Encounter (HOSPITAL_COMMUNITY): Payer: Self-pay | Admitting: Nephrology

## 2022-11-05 ENCOUNTER — Emergency Department (HOSPITAL_BASED_OUTPATIENT_CLINIC_OR_DEPARTMENT_OTHER): Payer: Self-pay

## 2022-11-05 ENCOUNTER — Ambulatory Visit (HOSPITAL_COMMUNITY)
Admission: EM | Admit: 2022-11-05 | Discharge: 2022-11-05 | Payer: Self-pay | Attending: Urgent Care | Admitting: Urgent Care

## 2022-11-05 ENCOUNTER — Emergency Department (HOSPITAL_BASED_OUTPATIENT_CLINIC_OR_DEPARTMENT_OTHER)
Admission: EM | Admit: 2022-11-05 | Discharge: 2022-11-05 | Disposition: A | Discharge status: Home / Self Care | Payer: Self-pay | Attending: Emergency Medicine | Admitting: Emergency Medicine

## 2022-11-05 DIAGNOSIS — Z992 Dependence on renal dialysis: Secondary | ICD-10-CM

## 2022-11-05 DIAGNOSIS — R0989 Other specified symptoms and signs involving the circulatory and respiratory systems: Secondary | ICD-10-CM

## 2022-11-05 DIAGNOSIS — R0600 Dyspnea, unspecified: Secondary | ICD-10-CM

## 2022-11-05 DIAGNOSIS — Z794 Long term (current) use of insulin: Secondary | ICD-10-CM | POA: Insufficient documentation

## 2022-11-05 DIAGNOSIS — Z79899 Other long term (current) drug therapy: Secondary | ICD-10-CM | POA: Insufficient documentation

## 2022-11-05 DIAGNOSIS — N186 End stage renal disease: Secondary | ICD-10-CM

## 2022-11-05 DIAGNOSIS — R072 Precordial pain: Secondary | ICD-10-CM | POA: Insufficient documentation

## 2022-11-05 DIAGNOSIS — I120 Hypertensive chronic kidney disease with stage 5 chronic kidney disease or end stage renal disease: Secondary | ICD-10-CM | POA: Insufficient documentation

## 2022-11-05 DIAGNOSIS — E1122 Type 2 diabetes mellitus with diabetic chronic kidney disease: Secondary | ICD-10-CM | POA: Insufficient documentation

## 2022-11-05 DIAGNOSIS — R0602 Shortness of breath: Secondary | ICD-10-CM | POA: Insufficient documentation

## 2022-11-05 LAB — BASIC METABOLIC PANEL
Anion gap: 12 (ref 5–15)
BUN: 39 mg/dL — ABNORMAL HIGH (ref 6–20)
CO2: 26 mmol/L (ref 22–32)
Calcium: 9.2 mg/dL (ref 8.9–10.3)
Chloride: 92 mmol/L — ABNORMAL LOW (ref 98–111)
Creatinine, Ser: 7.88 mg/dL — ABNORMAL HIGH (ref 0.61–1.24)
GFR, Estimated: 7 mL/min — ABNORMAL LOW (ref >60)
Glucose, Bld: 317 mg/dL — ABNORMAL HIGH (ref 70–99)
Potassium: 3.6 mmol/L (ref 3.5–5.1)
Sodium: 130 mmol/L — ABNORMAL LOW (ref 135–145)

## 2022-11-05 LAB — BRAIN NATRIURETIC PEPTIDE: B Natriuretic Peptide: 42.1 pg/mL (ref 0.0–100.0)

## 2022-11-05 LAB — CBC
HCT: 29 % — ABNORMAL LOW (ref 39.0–52.0)
Hemoglobin: 10.5 g/dL — ABNORMAL LOW (ref 13.0–17.0)
MCH: 31.2 pg (ref 26.0–34.0)
MCHC: 36.2 g/dL — ABNORMAL HIGH (ref 30.0–36.0)
MCV: 86.1 fL (ref 80.0–100.0)
Platelets: 159 10*3/uL (ref 150–400)
RBC: 3.37 MIL/uL — ABNORMAL LOW (ref 4.22–5.81)
RDW: 12.5 % (ref 11.5–15.5)
WBC: 8.1 10*3/uL (ref 4.0–10.5)
nRBC: 0 % (ref 0.0–0.2)

## 2022-11-05 LAB — TROPONIN I (HIGH SENSITIVITY)
Troponin I (High Sensitivity): 17 ng/L (ref <18)
Troponin I (High Sensitivity): 17 ng/L (ref <18)

## 2022-11-05 MED ORDER — IOHEXOL 350 MG/ML SOLN
100.0000 mL | Freq: Once | INTRAVENOUS | Status: AC | PRN
  Administered 2022-11-05: 100 mL via INTRAVENOUS

## 2022-11-05 NOTE — ED Provider Notes (Signed)
[name] Provider Note   CSN: [id number] Arrival date & time: 11/05/22  1427     History  Chief Complaint  Patient presents with   Shortness of Breath    Eric Glenn is a 57 y.o. male history of hypertension, type 2 diabetes, ESRD on dialysis presented with 1 week of dyspnea on exertion.  Patient is a dialysis patient and gets his dialysis Tuesday Thursday Saturday.  He is notes patient has been needing oxygen during his dialysis treatments recently which is unusual.  Patient has not missed any dialysis appointments and does not needed to leave any appointments early due to complications.  Patient states that he has become increasingly short of breath upon exertion and that he feels there is fluid on his lungs.  Patient states that shortness of breath causes him of back pain that radiates to his chest that he believes is from his lungs struggling to breathe.  Patient went to the urgent care and was referred to ER after chest x-ray showed cardiomegaly and vascular congestion due to concern for possible CHF.  Patient denies nausea vomiting, vision changes, headache, urinary/bowel incontinence, saddle anesthesia, dysuria, abdominal pain, cough, congestion, changes sensation/motor skills, recent illness  Home Medications Prior to Admission medications   Medication Sig Start Date End Date Taking? Authorizing Provider  acetaminophen (TYLENOL) 325 MG tablet Take 325-650 mg by mouth every 6 (six) hours as needed for mild pain or headache.    [provider]  albuterol (VENTOLIN HFA) 108 (90 Base) MCG/ACT inhaler Inhale 1-2 puffs into the lungs every 6 (six) hours as needed for wheezing or shortness of breath. 03/24/21   Ivette Loyal, NP  amLODipine (NORVASC) 10 MG tablet Take 1 tablet (10 mg total) by mouth daily. 03/17/22   Atway, Rayann N, DO  atorvastatin (LIPITOR) 40 MG tablet TAKE 1 TABLET (40 MG TOTAL) BY MOUTH DAILY.  08/01/20 08/12/21  Glenford Bayley, MD  carvedilol (COREG) 12.5 MG tablet Take 1 tablet (12.5 mg total) by mouth 2 (two) times daily. 08/30/22   Atway, Rayann N, DO  Continuous Blood Gluc Sensor (FREESTYLE LIBRE 3 SENSOR) MISC Place 1 sensor on the skin every 14 days and use to check glucose continuously. 10/19/21   Doran Stabler, DO  glucose blood (CONTOUR NEXT TEST) test strip Test blood glucose 4 times daily 01/01/19   Burna Cash, MD  hydrALAZINE (APRESOLINE) 100 MG tablet Take 1 tablet (100 mg total) by mouth 3 (three) times daily. 09/24/22   Atway, Rayann N, DO  insulin aspart (NOVOLOG FLEXPEN) 100 UNIT/ML FlexPen Inject 6 Units into the skin 3 (three) times daily with meals. 08/20/22   Mapp, Gaylyn Cheers, MD  insulin glargine (LANTUS SOLOSTAR) 100 UNIT/ML Solostar Pen Inject 30 Units into the skin 2 (two) times daily. 09/15/22   Atway, Rayann N, DO  Insulin Pen Needle 32G X 4 MM MISC USE AS DIRECTED TWICE DAILY. 06/06/20 06/06/21  Theotis Barrio, MD  Lancets MISC Test blood glucose 4 times daily 01/01/19   Burna Cash, MD  Lasmiditan Succinate (REYVOW) 50 MG TABS Take 1 tablet by mouth as needed (for migraine). 08/04/22   Ocie Doyne, MD  meclizine (ANTIVERT) 25 MG tablet Take 1 tablet (25 mg total) by mouth 2 (two) times daily as needed for vertigo/dizziness 03/04/22     multivitamin (RENA-VIT) TABS tablet Take 1 tablet by mouth daily. 08/04/18   Burna Cash, MD  Omega-3 Fatty Acids (FISH OIL) 1000 MG CAPS  Take 1,000 mg by mouth in the morning and at bedtime.    [provider]  senna-docusate (SENOKOT S) 8.6-50 MG tablet Take 1 tablet by mouth daily. 05/28/19   Theotis Barrio, MD  topiramate (TOPAMAX) 25 MG tablet Take 1 tablet (25 mg) in the morning and 2 tablets (50 mg) at bedtime for one week. Then increase to 2 pills (50 mg) twice a day. Take 2 tablets after dialysis on TTS. 08/04/22   Ocie Doyne, MD      Allergies    Other, Poractant alfa, Pork-derived  products, and Penicillins    Review of Systems   Review of Systems  Respiratory:  Positive for shortness of breath.   See HPI  Physical Exam Updated Vital Signs BP (!) 146/61   Pulse 64   Temp 98 F (36.7 C)   Resp 11   Wt 92.9 kg   SpO2 99%   BMI 35.16 kg/m  Physical Exam Constitutional:      General: He is not in acute distress. Eyes:     Extraocular Movements: Extraocular movements intact.     Conjunctiva/sclera: Conjunctivae normal.     Pupils: Pupils are equal, round, and reactive to light.  Cardiovascular:     Rate and Rhythm: Normal rate and regular rhythm.     Pulses: Normal pulses.     Heart sounds: Normal heart sounds. No murmur heard. Pulmonary:     Effort: Pulmonary effort is normal. No respiratory distress.     Breath sounds: Normal breath sounds.  Abdominal:     Palpations: Abdomen is soft.     Tenderness: There is no abdominal tenderness. There is no guarding or rebound.  Musculoskeletal:        General: Tenderness (Tender to palpation in bilateral neural parathoracic muscles no midline tenderness or crepitus/step-off/abnormalities palpated) present. Normal range of motion.     Cervical back: Normal range of motion.  Skin:    General: Skin is warm and dry.     Capillary Refill: Capillary refill takes less than 2 seconds.     Comments: No overlying skin color changes  Neurological:     Mental Status: He is alert.     Comments: Sensation intact in all 4 limbs  Psychiatric:        Mood and Affect: Mood normal.     ED Results / Procedures / Treatments   Labs (all labs ordered are listed, but only abnormal results are displayed) Labs Reviewed  BASIC METABOLIC PANEL - Abnormal; Notable for the following components:      Result Value   Sodium 130 (*)    Chloride 92 (*)    Glucose, Bld 317 (*)    BUN 39 (*)    Creatinine, Ser 7.88 (*)    GFR, Estimated 7 (*)    All other components within normal limits  CBC - Abnormal; Notable for the following  components:   RBC 3.37 (*)    Hemoglobin 10.5 (*)    HCT 29.0 (*)    MCHC 36.2 (*)    All other components within normal limits  BRAIN NATRIURETIC PEPTIDE  TROPONIN I (HIGH SENSITIVITY)  TROPONIN I (HIGH SENSITIVITY)    EKG None  Radiology CT Angio Chest PE W and/or Wo Contrast  Result Date: 11/05/2022 CLINICAL DATA:  Shortness of breath. Pulmonary embolism (PE) suspected, high prob EXAM: CT ANGIOGRAPHY CHEST WITH CONTRAST TECHNIQUE: Multidetector CT imaging of the chest was performed using the standard protocol during bolus administration of  intravenous contrast. Multiplanar CT image reconstructions and MIPs were obtained to evaluate the vascular anatomy. RADIATION DOSE REDUCTION: This exam was performed according to the departmental dose-optimization program which includes automated exposure control, adjustment of the mA and/or kV according to patient size and/or use of iterative reconstruction technique. CONTRAST:  OMNIPAQUE IOHEXOL 350 MG/ML SOLN COMPARISON:  08/29/2015 FINDINGS: Cardiovascular: No filling defects in the pulmonary arteries to suggest pulmonary emboli. Mild cardiomegaly. Aorta normal caliber. Mediastinum/Nodes: No mediastinal, hilar, or axillary adenopathy. Trachea and esophagus are unremarkable. Thyroid unremarkable. Lungs/Pleura: Elevation of the right hemidiaphragm. Linear right basilar atelectasis or scarring. No confluent opacities or effusions. Upper Abdomen: No acute findings Musculoskeletal: Chest wall soft tissues are unremarkable. No acute bony abnormality. Review of the MIP images confirms the above findings. IMPRESSION: No evidence of pulmonary embolus. Cardiomegaly. Elevation of the right hemidiaphragm with right base atelectasis or scarring. No acute cardiopulmonary disease. Electronically Signed   By: Charlett Nose M.D.   On: 11/05/2022 18:52   DG Chest 2 View  Result Date: 11/05/2022 CLINICAL DATA:  Shortness of breath EXAM: CHEST - 2 VIEW COMPARISON:   X-ray 03/24/2021 FINDINGS: Enlarged cardiopericardial silhouette with vascular congestion. No pneumothorax, effusion or edema. Under penetrated lateral view. IMPRESSION: Enlarged cardiopericardial silhouette with vascular congestion. Electronically Signed   By: Karen Kays M.D.   On: 11/05/2022 13:26    Procedures Procedures    Medications Ordered in ED Medications  iohexol (OMNIPAQUE) 350 MG/ML injection 100 mL (100 mLs Intravenous Contrast Given 11/05/22 1821)    ED Course/ Medical Decision Making/ A&P                             Medical Decision Making Amount and/or Complexity of Data Reviewed Labs: ordered. Radiology: ordered.   Sachit [name] 57 y.o. presented today for shortness of breath.  Working DDx that I considered at this time includes, but not limited to, asthma/COPD exacerbation, URI, viral illness, anemia, ACS, PE, pneumonia, pleural effusion, lung cancer, uremic pericarditis, CHF.  R/o DDx: CHF, uremic pericarditis, asthma/COPD exacerbation, URI, viral illness, anemia, ACS, PE, pneumonia, pleural effusion, lung cancer: These are considered less likely due to history of present illness and physical exam findings  Review of prior external notes: 11/05/2022 ED  Unique Tests and My Interpretation:  CBC: Unremarkable BMP: Hyperglycemia 317, the rest of the results are similar to previous readings EKG: Sinus to 62 bpm, no osteomyelitis or blocks noted Troponin: 17, 17 CXR: Enlarged cardiopericardial silhouette with vascular congestion CTA PE study: Right base atelectasis with elevated right hemidiaphragm  Discussion with Independent Historian:  Niece  Discussion of Management of Tests: None  Risk: Low: based on diagnostic testing/clinical impression and treatment plan  Risk Stratification Score:  none  Staffed with Long, MD  Plan: Patient presented for shortness of breath.  On exam patient was in no acute distress and not requiring oxygen and not  hypoxic.  Patient's niece was in the room to help facilitate history.  Patient's lungs were clear to auscultation and patient is back pain was reproducible when palpating bilateral mid thoracic parathoracic muscles.  Patient not have any midline tenderness or step-off/crepitus/abnormalities.  Patient good pulses motor and sensation all 4 limbs and no pulsatile mass was noted.  Labs will be obtained along with BNP to further with an elevated right diaphragm for possible CHF.  Patient stable at this time.  Labs have come back reassuring.  Upon chart review patient  has had cardiomegaly in the past however has not had vascular congestion.  On recheck patient still endorsed shortness of breath and needs stated that he is no longer able to do daily activities as he becomes short of breath quickly.  I spoke to Dr. Bronson Ing patient's symptoms and we discussed that patient would benefit from a CTA chest PE to evaluate for possible blood clot.  Patient has been on dialysis for 7 years and we feel comfortable giving contrast.  Patient was updated of this plan.  Patient CT came back unremarkable for PE or fluid around the heart.  These findings were verbalized to the patient and patient will be discharged with cardiology follow-up for echo done in the outpatient setting as patient is stable for discharge as he has not required oxygen and has not been hypoxic during ED course.  Patient was given return precautions. Patient stable for discharge at this time.  Patient verbalized understanding of plan.         Final Clinical Impression(s) / ED Diagnoses Final diagnoses:  SOB (shortness of breath)  Precordial chest pain    Rx / DC Orders ED Discharge Orders          Ordered    Ambulatory referral to Cardiology       Comments: If you have not heard from the Cardiology office within the next 72 hours please call [phone].   11/05/22 1805              Netta Corrigan, PA-C 11/05/22 1859     Long, Arlyss Repress, MD 11/06/22 2325

## 2022-11-05 NOTE — Discharge Instructions (Signed)
You should receive a phone call from the heart doctors in the next 72 hours regarding recent ER visit and symptoms.  You will need to see the heart docs for long-term management as you will need an ultrasound of your heart otherwise known as an echo.  Today that the CT scan did not show any blood clots or fluid around the heart and your labs were reassuring.  Please monitor your symptoms and if symptoms begin to worsen please return to ER.

## 2022-11-05 NOTE — ED Provider Notes (Signed)
[facility]RG[facility]  Note:  This document was prepared using Conservation officer, historic buildings and may include unintentional dictation errors.  MRN: [id number] DOB: [birth year]  Subjective:   Eric Glenn is a 57 y.o. male presenting for acute on chronic dyspnea.  Patient states he has had this for some time but is worsened with his dialysis. Last dialysis was yesterday.  No established history of congestive heart failure.  Patient does have a history of asthma but feels like it has not bothered him for years.  Denies any particular wheezing or shortness of breath at rest.  He can walk about 10 feet before he becomes short of breath.  Denies active chest pain.  No history of heart disease or MI.  No current facility-administered medications for this encounter.  Current Outpatient Medications:    acetaminophen (TYLENOL) 325 MG tablet, Take 325-650 mg by mouth every 6 (six) hours as needed for mild pain or headache., Disp: , Rfl:    albuterol (VENTOLIN HFA) 108 (90 Base) MCG/ACT inhaler, Inhale 1-2 puffs into the lungs every 6 (six) hours as needed for wheezing or shortness of breath., Disp: 18 g, Rfl: 0   amLODipine (NORVASC) 10 MG tablet, Take 1 tablet (10 mg total) by mouth daily., Disp: 90 tablet, Rfl: 3   atorvastatin (LIPITOR) 40 MG tablet, TAKE 1 TABLET (40 MG TOTAL) BY MOUTH DAILY., Disp: 30 tablet, Rfl: 3   carvedilol (COREG) 12.5 MG tablet, Take 1 tablet (12.5 mg total) by mouth 2 (two) times daily., Disp: 60 tablet, Rfl: 6   Continuous Blood Gluc Sensor (FREESTYLE LIBRE 3 SENSOR) MISC, Place 1 sensor on the skin every 14 days and use to check glucose continuously., Disp: 2 each, Rfl: 10   glucose blood (CONTOUR NEXT TEST) test strip, Test blood glucose 4 times daily, Disp: 100 each, Rfl: 11   hydrALAZINE (APRESOLINE) 100 MG tablet, Take 1 tablet (100 mg total) by mouth 3 (three) times daily., Disp: 90 tablet, Rfl: 3   insulin aspart (NOVOLOG FLEXPEN) 100  UNIT/ML FlexPen, Inject 6 Units into the skin 3 (three) times daily with meals., Disp: 15 mL, Rfl: 11   insulin glargine (LANTUS SOLOSTAR) 100 UNIT/ML Solostar Pen, Inject 30 Units into the skin 2 (two) times daily., Disp: 21 mL, Rfl: 6   Insulin Pen Needle 32G X 4 MM MISC, USE AS DIRECTED TWICE DAILY., Disp: 100 each, Rfl: 12   Lancets MISC, Test blood glucose 4 times daily, Disp: 100 each, Rfl: 11   Lasmiditan Succinate (REYVOW) 50 MG TABS, Take 1 tablet by mouth as needed (for migraine)., Disp: 12 tablet, Rfl: 5   meclizine (ANTIVERT) 25 MG tablet, Take 1 tablet (25 mg total) by mouth 2 (two) times daily as needed for vertigo/dizziness, Disp: 60 tablet, Rfl: 6   multivitamin (RENA-VIT) TABS tablet, Take 1 tablet by mouth daily., Disp: 60 tablet, Rfl: 5   Omega-3 Fatty Acids (FISH OIL) 1000 MG CAPS, Take 1,000 mg by mouth in the morning and at bedtime., Disp: , Rfl:    senna-docusate (SENOKOT S) 8.6-50 MG tablet, Take 1 tablet by mouth daily., Disp: 30 tablet, Rfl: 0   topiramate (TOPAMAX) 25 MG tablet, Take 1 tablet (25 mg) in the morning and 2 tablets (50 mg) at bedtime for one week. Then increase to 2 pills (50 mg) twice a day. Take 2 tablets after dialysis on TTS., Disp: 120 tablet, Rfl: 6   Allergies  Allergen Reactions   Other Other (See Comments)  and Rash    Raises blood pressure   Poractant Alfa Rash and Other (See Comments)    Raises blood pressure   Pork-Derived Products Rash and Other (See Comments)    Raises blood pressure   Penicillins Hives    Past Medical History:  Diagnosis Date   Acute infective otitis externa, right    Anemia    Diabetes (HCC)    Diabetes mellitus type 2, controlled (HCC) 08/04/2015   Diabetic retinopathy (HCC)    Diabetic retinopathy (HCC)    Dialysis patient (HCC)    Diarrhea    ESRD (end stage renal disease) (HCC) 03/10/2016   High cholesterol    Hypertension    Nausea 08/2017   Ulcer of right foot, limited to breakdown of skin (HCC)  11/28/2019     Past Surgical History:  Procedure Laterality Date   A/V FISTULAGRAM Right 08/12/2021   Procedure: A/V FISTULAGRAM;  Surgeon: Victorino Sparrow, MD;  Location: [facility] INVASIVE CV LAB;  Service: Cardiovascular;  Laterality: Right;   AV FISTULA PLACEMENT Right 09/04/2015   Procedure: ARTERIOVENOUS (AV) FISTULA CREATION;  Surgeon: Chuck Hint, MD;  Location: [facility] OR;  Service: Vascular;  Laterality: Right;   AV FISTULA PLACEMENT Right 01/22/2016   Procedure: RIGHT UPPER ARM BRACHIOCEPHALIC ARTERIOVENOUS (AV) FISTULA CREATION;  Surgeon: Chuck Hint, MD;  Location: [facility] OR;  Service: Vascular;  Laterality: Right;   IR AV DIALY SHUNT INTRO NEEDLE/INTRACATH INITIAL W/PTA/IMG RIGHT Right 03/01/2022   IR DIALY SHUNT INTRO NEEDLE/INTRACATH INITIAL W/IMG RIGHT Right 12/08/2018   IR DIALY SHUNT INTRO NEEDLE/INTRACATH INITIAL W/IMG RIGHT Right 08/18/2020   IR US GUIDE VASC ACCESS RIGHT  03/01/2022   LIGATION OF ARTERIOVENOUS  FISTULA Right 01/22/2016   Procedure: LIGATION OF RIGHT FOREARM ARTERIOVENOUS  FISTULA;  Surgeon: Chuck Hint, MD;  Location: [facility] OR;  Service: Vascular;  Laterality: Right;   PERIPHERAL VASCULAR BALLOON ANGIOPLASTY Right 08/12/2021   Procedure: PERIPHERAL VASCULAR BALLOON ANGIOPLASTY;  Surgeon: Victorino Sparrow, MD;  Location: [facility] INVASIVE CV LAB;  Service: Cardiovascular;  Laterality: Right;  upper arm PTA   PERIPHERAL VASCULAR CATHETERIZATION N/A 01/12/2016   Procedure: Fistulagram;  Surgeon: Chuck Hint, MD;  Location: [facility] INVASIVE CV LAB;  Service: Cardiovascular;  Laterality: N/A;   UMBILICAL HERNIA REPAIR      Family History  Problem Relation Age of Onset   Hypertension Mother    Hyperlipidemia Mother    Diabetes Mellitus II Sister     Social History   Tobacco Use   Smoking status: Former    Types: Cigarettes    Quit date: 10/22/2003    Years since quitting: 19.0   Smokeless tobacco: Never  Vaping Use   Vaping Use: Never used   Substance Use Topics   Alcohol use: No    Alcohol/week: 0.0 standard drinks of alcohol   Drug use: No    ROS   Objective:   Vitals: BP 139/71 (BP Location: Left Arm)   Pulse 61   Temp 98.1 F (36.7 C) (Oral)   Resp 17   SpO2 97%   Physical Exam Constitutional:      General: He is not in acute distress.    Appearance: Normal appearance. He is well-developed. He is not ill-appearing, toxic-appearing or diaphoretic.  HENT:     Head: Normocephalic and atraumatic.     Right Ear: External ear normal.     Left Ear: External ear normal.     Nose: Nose normal.  Mouth/Throat:     Mouth: Mucous membranes are moist.  Eyes:     General: No scleral icterus.       Right eye: No discharge.        Left eye: No discharge.     Extraocular Movements: Extraocular movements intact.  Cardiovascular:     Rate and Rhythm: Normal rate and regular rhythm.     Heart sounds: Normal heart sounds. No murmur heard.    No friction rub. No gallop.  Pulmonary:     Effort: Pulmonary effort is normal. No respiratory distress.     Breath sounds: Normal breath sounds. No stridor. No wheezing, rhonchi or rales.  Neurological:     Mental Status: He is alert and oriented to person, place, and time.  Psychiatric:        Mood and Affect: Mood normal.        Behavior: Behavior normal.        Thought Content: Thought content normal.     DG Chest 2 View  Result Date: 11/05/2022 CLINICAL DATA:  Shortness of breath EXAM: CHEST - 2 VIEW COMPARISON:  X-ray 03/24/2021 FINDINGS: Enlarged cardiopericardial silhouette with vascular congestion. No pneumothorax, effusion or edema. Under penetrated lateral view. IMPRESSION: Enlarged cardiopericardial silhouette with vascular congestion. Electronically Signed   By: Karen Kays M.D.   On: 11/05/2022 13:26     Assessment and Plan :   PDMP not reviewed this encounter.  1. Dyspnea, unspecified type   2. Pulmonary vascular congestion   3. ESRD on dialysis    I  discussed case with Dr. Delton See.  As patient has ESRD and symptoms suggestive of new onset congestive heart failure recommended further evaluation through the emergency room.  He is hemodynamically stable and can present by personal vehicle.  I advised patient that he would have more testing done to further evaluate his symptoms and confirm possible diagnosis of congestive heart failure.  Patient and his niece verbalized understanding and will present to the emergency room now.   Wallis Bamberg, [place] 11/05/22 1354

## 2022-11-05 NOTE — Discharge Instructions (Signed)
Please go to the emergency room now as I am concerned that you are in need of a higher level of care than we can provide in the urgent care setting.  This includes further management for possible heart failure as the x-ray shows that you have vascular congestion and enlarged cardiac silhouette.  This would explain why you are having the shortness of breath.  However, this requires more testing and management through the emergency room.  Please make sure you go to the emergency room now.

## 2022-11-05 NOTE — ED Triage Notes (Signed)
Pt presents with SOB and reports he has been getting home fatigued after dialyses. Pt niece reports he has been placed on oxygen at the clinic more often. States pt has complained of lung pain. States the pain moves from mid back to his chest. States he often feels he isn't getting enough oxygen and has to try harder to breath.

## 2022-11-05 NOTE — ED Triage Notes (Signed)
Shortness of breath x 1 week , progressive worsening in fatigue , he has been getting oxygen during\ dialysis which is unusual per niece . Last dialysis yesterday . Chest pain . Denies cough or congestion

## 2022-11-08 ENCOUNTER — Other Ambulatory Visit (HOSPITAL_COMMUNITY): Payer: Self-pay

## 2022-11-19 ENCOUNTER — Encounter (HOSPITAL_COMMUNITY): Payer: Self-pay | Admitting: Nephrology

## 2022-11-19 ENCOUNTER — Ambulatory Visit: Payer: Self-pay

## 2022-11-19 ENCOUNTER — Other Ambulatory Visit (HOSPITAL_COMMUNITY): Payer: Self-pay

## 2022-11-19 VITALS — BP 133/63 | HR 73 | Temp 98.1°F | Wt 216.9 lb

## 2022-11-19 DIAGNOSIS — I120 Hypertensive chronic kidney disease with stage 5 chronic kidney disease or end stage renal disease: Secondary | ICD-10-CM

## 2022-11-19 DIAGNOSIS — R0600 Dyspnea, unspecified: Secondary | ICD-10-CM

## 2022-11-19 DIAGNOSIS — E785 Hyperlipidemia, unspecified: Secondary | ICD-10-CM

## 2022-11-19 DIAGNOSIS — N186 End stage renal disease: Secondary | ICD-10-CM

## 2022-11-19 DIAGNOSIS — E1169 Type 2 diabetes mellitus with other specified complication: Secondary | ICD-10-CM

## 2022-11-19 DIAGNOSIS — F32A Depression, unspecified: Secondary | ICD-10-CM

## 2022-11-19 DIAGNOSIS — I10 Essential (primary) hypertension: Secondary | ICD-10-CM

## 2022-11-19 DIAGNOSIS — Z794 Long term (current) use of insulin: Secondary | ICD-10-CM

## 2022-11-19 DIAGNOSIS — E1122 Type 2 diabetes mellitus with diabetic chronic kidney disease: Secondary | ICD-10-CM

## 2022-11-19 LAB — POCT GLYCOSYLATED HEMOGLOBIN (HGB A1C): Hemoglobin A1C: 7.7 % — AB (ref 4.0–5.6)

## 2022-11-19 LAB — GLUCOSE, CAPILLARY: Glucose-Capillary: 326 mg/dL — ABNORMAL HIGH (ref 70–99)

## 2022-11-19 MED ORDER — ATORVASTATIN CALCIUM 40 MG PO TABS
40.0000 mg | ORAL_TABLET | Freq: Every day | ORAL | 3 refills | Status: DC
Start: 2022-11-19 — End: 2023-04-01
  Filled 2022-11-19: qty 30, 30d supply, fill #0
  Filled 2022-12-20: qty 30, 30d supply, fill #1
  Filled 2023-01-18: qty 30, 30d supply, fill #2
  Filled 2023-02-25 (×2): qty 30, 30d supply, fill #3

## 2022-11-19 MED ORDER — NOVOLOG FLEXPEN 100 UNIT/ML ~~LOC~~ SOPN
8.0000 [IU] | PEN_INJECTOR | Freq: Three times a day (TID) | SUBCUTANEOUS | 11 refills | Status: DC
Start: 2022-11-19 — End: 2023-04-28

## 2022-11-19 NOTE — Assessment & Plan Note (Addendum)
Reports dyspnea on exertion as well as orthopnea and apneic episodes at night. He has fatigue with dialysis sessions and he has been placed on supplemental O2 during HD.  He was diagnosed with sleep apnea in 2017 but never started cpap therapy. Last Echo is in 2020 and shows EF 60-65%, impaired relaxation, left atrial dilation. No allergic symptoms or wheezing.   Exam today is unremarkable. He is not in respiratory distress and has good O2 sat. No wheezing or crackles.  Multifactorial dyspnea. Work up limited by lack of insurance coverage but they are working on [employer]. -consider repeat ECHO, CPAP  (can repeat sleep study if needed), PT for deconditioning at next visit if symptoms persist and he has orange card.

## 2022-11-19 NOTE — Assessment & Plan Note (Addendum)
On TThS HD with new nephrologist, not sure who the new doctor is. He is reporting dyspnea with HD sessions. Has difficulty communicating with staff due to language barrier and hearing problems. He is not currently volume overloaded. Family mentions nephrology is adjusting as needed.

## 2022-11-19 NOTE — Patient Instructions (Signed)
Eric Glenn, it was a pleasure seeing you today! You endorsed feeling well today. Below are some of the things we talked about this visit. We look forward to seeing you in the follow up appointment!  Today we discussed: Please take 8 units of novolog with meals instead of 6. Check your blood sugars fasting and before each meal.  I am restarting your lipitor which is a cholesterol medicine  I am putting in a referral to see Marena Chancy, our counselor.  I have ordered the following labs today:  Lab Orders         Glucose, capillary         POC Hbg A1C       Referrals ordered today:   Referral Orders         Ambulatory referral to Integrated Behavioral Health       I have ordered the following medication/changed the following medications:   Stop the following medications: Medications Discontinued During This Encounter  Medication Reason   atorvastatin (LIPITOR) 40 MG tablet Reorder   insulin aspart (NOVOLOG FLEXPEN) 100 UNIT/ML FlexPen Reorder     Start the following medications: Meds ordered this encounter  Medications   insulin aspart (NOVOLOG FLEXPEN) 100 UNIT/ML FlexPen    Sig: Inject 8 Units into the skin 3 (three) times daily with meals.    Dispense:  15 mL    Refill:  11    IM program   atorvastatin (LIPITOR) 40 MG tablet    Sig: Take 1 tablet (40 mg total) by mouth daily.    Dispense:  30 tablet    Refill:  3    IM Program     Follow-up:  4 weeks shortness of breath     Please make sure to arrive 15 minutes prior to your next appointment. If you arrive late, you may be asked to reschedule.   We look forward to seeing you next time. Please call our clinic at [phone] if you have any questions or concerns. The best time to call is Monday-Friday from 9am-4pm, but there is someone available 24/7. If after hours or the weekend, call the main hospital number and ask for the Internal Medicine Resident On-Call. If you need medication refills, please notify  your pharmacy one week in advance and they will send [name] a request.  Thank you for letting us take part in your care. Wishing you the best!  Thank you, Lyndle Herrlich MD

## 2022-11-19 NOTE — Assessment & Plan Note (Addendum)
ESRD, on amlodipine 10, coreg 12.5mg  BID, hydralazine 100mg  TID. BP elevated to 167/70 initially and then down to 133/63. -follows with nephrology, mildly hypertensive here and should get dialyzed tomorrow. No changes.

## 2022-11-19 NOTE — Assessment & Plan Note (Addendum)
Hx Hld, lipid panel not repeated in quite a while  with elevated TG 296, HDL 23, LDL 23. Unsure if he was taking statin at that time. Regardless, he warrants statin therapy for his CV risk. Will restart lipitor 40 today and can check lipid panel at next visit when he ideally will have orange card.

## 2022-11-19 NOTE — Progress Notes (Signed)
CC: routine visit  HPI:  [name] is a 57 y.o.-year-old male with past medical history as below presenting for routine visit.  Please see encounters tab for problem-based charting.  Past Medical History:  Diagnosis Date   Acute infective otitis externa, right    Anemia    Diabetes (HCC)    Diabetes mellitus type 2, controlled (HCC) 08/04/2015   Diabetic retinopathy (HCC)    Diabetic retinopathy (HCC)    Dialysis patient (HCC)    Diarrhea    ESRD (end stage renal disease) (HCC) 03/10/2016   High cholesterol    Hypertension    Nausea 08/2017   Ulcer of right foot, limited to breakdown of skin (HCC) 11/28/2019   Review of Systems: As in HPI.  Please see encounters tab for problem based charting.  Physical Exam:  Vitals:   11/19/22 0908 11/19/22 1020  BP: (!) 167/70 133/63  Pulse: 74 73  Temp: 98.1 F (36.7 C)   TempSrc: Oral   SpO2: 99%   Weight: 216 lb 14.4 oz (98.4 kg)    General:Well-appearing, pleasant, In NAD Cardiac: RRR, no murmurs rubs or gallops. No JVD, No BLE pitting edema Respiratory: Normal work of breathing on room air, CTAB no crackles or wheezing Abdominal: Soft, nontender, nondistended   Assessment & Plan:   Essential hypertension ESRD, on amlodipine 10, coreg 12.5mg  BID, hydralazine 100mg  TID. BP elevated to 167/70 initially and then down to 133/63. -follows with nephrology, mildly hypertensive here and should get dialyzed tomorrow. No changes.  Type 2 diabetes mellitus with other specified complication (HCC) On lantus 30 units BID, novolog 6 TID AC started last visit which he takes before each meal. He infrequently feels weak, shaky, sweaty, but has not measured his blood sugar during one of these episodes. Family members report that his blood sugar is typically in 150s fasting and higher up to 300-400 postprandial. CBG in clinic is 326, he had breakfast before coming. Unsure if his reported symptoms are truly consistent with  hypoglycemia given lack of data, but it is clear that his postprandial sugars are elevated. -instructed to check sugar if he feels hypoglycemic and fasting + AC. Consider CGM down the line -continue lantus 30 units BID, novolog 8 AC -refer to ophthalmology when he has orange card  Hyperlipidemia Hx Hld, lipid panel not repeated in quite a while  with elevated TG 296, HDL 23, LDL 23. Unsure if he was taking statin at that time. Regardless, he warrants statin therapy for his CV risk. Will restart lipitor 40 today and can check lipid panel at next visit when he ideally will have orange card.  ESRD (end stage renal disease) (HCC) On TThS HD with new nephrologist, not sure who the new doctor is. He is reporting dyspnea with HD sessions. Has difficulty communicating with staff due to language barrier and hearing problems. He is not currently volume overloaded. Family mentions nephrology is adjusting as needed.  Dyspnea Reports dyspnea on exertion as well as orthopnea and apneic episodes at night. He has fatigue with dialysis sessions and he has been placed on supplemental O2 during HD.  He was diagnosed with sleep apnea in 2017 but never started cpap therapy. Last Echo is in 2020 and shows EF 60-65%, impaired relaxation, left atrial dilation. No allergic symptoms or wheezing.   Exam today is unremarkable. He is not in respiratory distress and has good O2 sat. No wheezing or crackles.  Multifactorial dyspnea. Work up limited by lack of insurance coverage  but they are working on orange card. -consider repeat ECHO, CPAP  (can repeat sleep study if needed), PT for deconditioning at next visit if symptoms persist and he has orange card.  Depression PHQ9 is 13 today. Family members report that he has been more withdrawn, has less energy and is worried about his dyspnea and other health conditions. He is somewhat reluctant to admit that his mood has been depressed. He is open to CBT but would strongly prefer  a spanish-speaking therapist and would like to remain with the same one over time. Limited options given lack of coverage but might be able to refer out after orange card. For now, can put in integrated behavioral health referral. -IBH referral to see bianca, can reassess   Patient discussed with Dr.  Lafonda Mosses \

## 2022-11-19 NOTE — Assessment & Plan Note (Signed)
PHQ9 is 13 today. Family members report that he has been more withdrawn, has less energy and is worried about his dyspnea and other health conditions. He is somewhat reluctant to admit that his mood has been depressed. He is open to CBT but would strongly prefer a spanish-speaking therapist and would like to remain with the same one over time. Limited options given lack of coverage but might be able to refer out after orange card. For now, can put in integrated behavioral health referral. -IBH referral to see bianca, can reassess

## 2022-11-19 NOTE — Assessment & Plan Note (Addendum)
On lantus 30 units BID, novolog 6 TID AC started last visit which he takes before each meal. He infrequently feels weak, shaky, sweaty, but has not measured his blood sugar during one of these episodes. Family members report that his blood sugar is typically in 150s fasting and higher up to 300-400 postprandial. CBG in clinic is 326, he had breakfast before coming. Unsure if his reported symptoms are truly consistent with hypoglycemia given lack of data, but it is clear that his postprandial sugars are elevated. -instructed to check sugar if he feels hypoglycemic and fasting + AC. Consider CGM down the line -continue lantus 30 units BID, novolog 8 AC -refer to ophthalmology when he has orange card

## 2022-11-22 NOTE — Progress Notes (Signed)
Internal Medicine Clinic Attending  Case discussed with Dr. Sridharan  At the time of the visit.  We reviewed the resident's history and exam and pertinent patient test results.  I agree with the assessment, diagnosis, and plan of care documented in the resident's note.  

## 2022-11-23 ENCOUNTER — Other Ambulatory Visit (HOSPITAL_COMMUNITY): Payer: Self-pay

## 2022-11-23 ENCOUNTER — Other Ambulatory Visit: Payer: Self-pay

## 2022-11-23 ENCOUNTER — Encounter (HOSPITAL_COMMUNITY): Payer: Self-pay | Admitting: Nephrology

## 2022-11-23 DIAGNOSIS — E1169 Type 2 diabetes mellitus with other specified complication: Secondary | ICD-10-CM

## 2022-11-23 NOTE — Telephone Encounter (Signed)
Refilled 4 days ago with dose adjustment

## 2022-11-29 ENCOUNTER — Other Ambulatory Visit (HOSPITAL_COMMUNITY): Payer: Self-pay

## 2022-12-02 ENCOUNTER — Other Ambulatory Visit (HOSPITAL_COMMUNITY): Payer: Self-pay

## 2022-12-02 ENCOUNTER — Encounter (HOSPITAL_COMMUNITY): Payer: Self-pay | Admitting: Nephrology

## 2022-12-03 ENCOUNTER — Telehealth: Payer: Self-pay | Admitting: Internal Medicine

## 2022-12-03 ENCOUNTER — Encounter: Payer: Self-pay | Admitting: Internal Medicine

## 2022-12-10 ENCOUNTER — Other Ambulatory Visit (HOSPITAL_COMMUNITY): Payer: Self-pay

## 2022-12-10 ENCOUNTER — Encounter (HOSPITAL_COMMUNITY): Payer: Self-pay | Admitting: Nephrology

## 2022-12-13 NOTE — Progress Notes (Unsigned)
Cardiology Office Note:   Date:  12/15/2022  NAME:  Eric Glenn    MRN: [id number] DOB:  04/08/1966   PCP:  Chauncey Mann, DO  Cardiologist:  None  Electrophysiologist:  None   Referring MD: Maia Plan, MD   Chief Complaint  Patient presents with   Chest Pain    History of Present Illness:   Eric Glenn is a 57 y.o. male with a hx of ESRD, DM, HTN, HLD who is being seen today for the evaluation of chest pain at the request of [place], Rayann N, DO.  He reports he has had intermittent chest discomfort and shortness of breath for the past 6 to 12 months.  Described as sharp chest discomfort.  Occurs in the center of his chest.  Can occur with stress.  Also in the shower.  Seems to occur when he is an active.  Not exacerbated by exertion or activity.  Evaluated emergency room.  Recent CT PE study negative.  No coronary calcium.  His EKG in office is normal.  All of his lab work seems to be within limits.  He is diabetic.  A1c 7.7.  He also is short of breath with activity.  Chest tightness encouraged intermittently.  No predictable pattern.  No alleviating factors other than time.  Blood pressure slightly elevated today.  No history of heart attack or stroke.  No family history of heart disease.  Currently on dialysis.  Has been on dialysis for 6 to 7 years.  Does not smoke.  No alcohol or drug use is reported.  CV exam normal.  He presents with his daughter.  Apparently he is getting short of breath with lying flat.  They are working on trying to determine what his dry weight he is with dialysis.  We discussed stress test and echo.  He is interested.  CT PE -> 0 CAC  Problem List ESRD  DM -A1c 7.7  HTN HLD -T chol 90, HDL 23, LDL 23, TG 296  Past Medical History: Past Medical History:  Diagnosis Date   Acute infective otitis externa, right    Anemia    Diabetes (HCC)    Diabetes mellitus type 2, controlled (HCC) 08/04/2015   Diabetic retinopathy (HCC)     Diabetic retinopathy (HCC)    Dialysis patient (HCC)    Diarrhea    ESRD (end stage renal disease) (HCC) 03/10/2016   High cholesterol    Hypertension    Nausea 08/2017   Ulcer of right foot, limited to breakdown of skin (HCC) 11/28/2019    Past Surgical History: Past Surgical History:  Procedure Laterality Date   A/V FISTULAGRAM Right 08/12/2021   Procedure: A/V FISTULAGRAM;  Surgeon: Victorino Sparrow, MD;  Location: [facility] INVASIVE CV LAB;  Service: Cardiovascular;  Laterality: Right;   AV FISTULA PLACEMENT Right 09/04/2015   Procedure: ARTERIOVENOUS (AV) FISTULA CREATION;  Surgeon: Chuck Hint, MD;  Location: [facility] OR;  Service: Vascular;  Laterality: Right;   AV FISTULA PLACEMENT Right 01/22/2016   Procedure: RIGHT UPPER ARM BRACHIOCEPHALIC ARTERIOVENOUS (AV) FISTULA CREATION;  Surgeon: Chuck Hint, MD;  Location: [facility] OR;  Service: Vascular;  Laterality: Right;   IR AV DIALY SHUNT INTRO NEEDLE/INTRACATH INITIAL W/PTA/IMG RIGHT Right 03/01/2022   IR DIALY SHUNT INTRO NEEDLE/INTRACATH INITIAL W/IMG RIGHT Right 12/08/2018   IR DIALY SHUNT INTRO NEEDLE/INTRACATH INITIAL W/IMG RIGHT Right 08/18/2020   IR US GUIDE VASC ACCESS RIGHT  03/01/2022   LIGATION OF ARTERIOVENOUS  FISTULA Right  01/22/2016   Procedure: LIGATION OF RIGHT FOREARM ARTERIOVENOUS  FISTULA;  Surgeon: Chuck Hint, MD;  Location: [facility] OR;  Service: Vascular;  Laterality: Right;   PERIPHERAL VASCULAR BALLOON ANGIOPLASTY Right 08/12/2021   Procedure: PERIPHERAL VASCULAR BALLOON ANGIOPLASTY;  Surgeon: Victorino Sparrow, MD;  Location: [facility] INVASIVE CV LAB;  Service: Cardiovascular;  Laterality: Right;  upper arm PTA   PERIPHERAL VASCULAR CATHETERIZATION N/A 01/12/2016   Procedure: Fistulagram;  Surgeon: Chuck Hint, MD;  Location: [facility] INVASIVE CV LAB;  Service: Cardiovascular;  Laterality: N/A;   UMBILICAL HERNIA REPAIR      Current Medications: Current Meds  Medication Sig   acetaminophen (TYLENOL) 325 MG  tablet Take 325-650 mg by mouth every 6 (six) hours as needed for mild pain or headache.   albuterol (VENTOLIN HFA) 108 (90 Base) MCG/ACT inhaler Inhale 1-2 puffs into the lungs every 6 (six) hours as needed for wheezing or shortness of breath.   amLODipine (NORVASC) 10 MG tablet Take 1 tablet (10 mg total) by mouth daily.   atorvastatin (LIPITOR) 40 MG tablet Take 1 tablet (40 mg total) by mouth daily.   carvedilol (COREG) 12.5 MG tablet Take 1 tablet (12.5 mg total) by mouth 2 (two) times daily.   Continuous Blood Gluc Sensor (FREESTYLE LIBRE 3 SENSOR) MISC Place 1 sensor on the skin every 14 days and use to check glucose continuously.   glucose blood (CONTOUR NEXT TEST) test strip Test blood glucose 4 times daily   hydrALAZINE (APRESOLINE) 100 MG tablet Take 1 tablet (100 mg total) by mouth 3 (three) times daily.   insulin aspart (NOVOLOG FLEXPEN) 100 UNIT/ML FlexPen Inject 8 Units into the skin 3 (three) times daily with meals.   insulin glargine (LANTUS SOLOSTAR) 100 UNIT/ML Solostar Pen Inject 30 Units into the skin 2 (two) times daily.   Lancets MISC Test blood glucose 4 times daily   Lasmiditan Succinate (REYVOW) 50 MG TABS Take 1 tablet by mouth as needed (for migraine).   meclizine (ANTIVERT) 25 MG tablet Take 1 tablet (25 mg total) by mouth 2 (two) times daily as needed for vertigo/dizziness   multivitamin (RENA-VIT) TABS tablet Take 1 tablet by mouth daily.   Omega-3 Fatty Acids (FISH OIL) 1000 MG CAPS Take 1,000 mg by mouth in the morning and at bedtime.   senna-docusate (SENOKOT S) 8.6-50 MG tablet Take 1 tablet by mouth daily.   topiramate (TOPAMAX) 25 MG tablet Take 1 tablet (25 mg) in the morning and 2 tablets (50 mg) at bedtime for one week. Then increase to 2 pills (50 mg) twice a day. Take 2 tablets after dialysis on TTS.     Allergies:    Other, Poractant alfa, Pork-derived products, and Penicillins   Social History: Social History   Socioeconomic History   Marital  status: Single    Spouse name: Not on file   Number of children: 2   Years of education: Not on file   Highest education level: Not on file  Occupational History   Occupation: Disabled  Tobacco Use   Smoking status: Former    Types: Cigarettes    Quit date: 10/22/2003    Years since quitting: 19.1   Smokeless tobacco: Never  Vaping Use   Vaping Use: Never used  Substance and Sexual Activity   Alcohol use: No    Alcohol/week: 0.0 standard drinks of alcohol   Drug use: No   Sexual activity: Not on file  Other Topics Concern   Not on  file  Social History Narrative   Not on file   Social Determinants of Health   Financial Resource Strain: Not on file  Food Insecurity: No Food Insecurity (05/28/2022)   Hunger Vital Sign    Worried About Running Out of Food in the Last Year: Never true    Ran Out of Food in the Last Year: Never true  Transportation Needs: Not on file  Physical Activity: Not on file  Stress: Not on file  Social Connections: Moderately Isolated (05/28/2022)   Social Connection and Isolation Panel [NHANES]    Frequency of Communication with Friends and Family: More than three times a week    Frequency of Social Gatherings with Friends and Family: More than three times a week    Attends Religious Services: Never    Database administrator or Organizations: Yes    Attends Banker Meetings: Never    Marital Status: Never married     Family History: The patient's family history includes Diabetes Mellitus II in his sister; Hyperlipidemia in his mother; Hypertension in his mother.  ROS:   All other ROS reviewed and negative. Pertinent positives noted in the HPI.     EKGs/Labs/Other Studies Reviewed:   The following studies were personally reviewed by me today:  EKG:  EKG is ordered today.  The ekg ordered today demonstrates normal sinus rhythm heart rate 78 no acute ischemic changes or evidence of infarction, and was personally reviewed by me.   TTE  02/23/2019  1. The left ventricle has normal systolic function with an ejection  fraction of 60-65%. The cavity size was normal. There is mildly increased  left ventricular wall thickness. Left ventricular diastolic Doppler  parameters are consistent with impaired  relaxation. Indeterminate filling pressures.   2. The right ventricle has normal systolic function. The cavity was  normal. There is no increase in right ventricular wall thickness. Right  ventricular systolic pressure could not be assessed.   3. Left atrial size was mild-moderately dilated.   4. The aortic valve is tricuspid. Aortic valve regurgitation is trivial  by color flow Doppler. No stenosis of the aortic valve.   5. There is mild dilatation of the aortic root measuring 41 mm.   Recent Labs: 11/05/2022: B Natriuretic Peptide 42.1; BUN 39; Creatinine, Ser 7.88; Hemoglobin 10.5; Platelets 159; Potassium 3.6; Sodium 130   Recent Lipid Panel    Component Value Date/Time   CHOL 90 (L) 03/31/2020 1516   TRIG 296 (H) 03/31/2020 1516   HDL 23 (L) 03/31/2020 1516   CHOLHDL 3.9 03/31/2020 1516   CHOLHDL 3.3 02/05/2016 1054   VLDL 33 (H) 02/05/2016 1054   LDLCALC 23 03/31/2020 1516   LDLDIRECT 94 08/06/2015 0539    Physical Exam:   VS:  BP (!) 156/80   Pulse 78   Ht 5\' 4"  (1.626 m)   Wt 214 lb (97.1 kg)   SpO2 96%   BMI 36.73 kg/m    Wt Readings from Last 3 Encounters:  12/15/22 214 lb (97.1 kg)  11/19/22 216 lb 14.4 oz (98.4 kg)  11/05/22 204 lb 12.9 oz (92.9 kg)    General: Well nourished, well developed, in no acute distress Head: Atraumatic, normal size  Eyes: PEERLA, EOMI  Neck: Supple, no JVD Endocrine: No thryomegaly Cardiac: Normal S1, S2; RRR; no murmurs, rubs, or gallops Lungs: Clear to auscultation bilaterally, no wheezing, rhonchi or rales  Abd: Soft, nontender, no hepatomegaly  Ext: No edema, pulses 2+ Musculoskeletal: No  deformities, BUE and BLE strength normal and equal Skin: Warm and dry, no  rashes   Neuro: Alert and oriented to person, place, time, and situation, CNII-XII grossly intact, no focal deficits  Psych: Normal mood and affect   ASSESSMENT:   Eric Glenn is a 57 y.o. male who presents for the following: 1. Precordial pain   2. SOB (shortness of breath) on exertion   3. Mixed hyperlipidemia     PLAN:   1. Precordial pain -Describes several months of intermittent sharp central chest discomfort.  Likely noncardiac.  EKG normal recent CT PE study with no evidence of coronary calcium.  He is on dialysis.  We did discuss nuclear medicine stress testing to exclude any CAD.  If this is negative would recommend GI evaluation and possible initiation of acid reflux medication.  2. SOB (shortness of breath) on exertion -Shortness of breath with activity.  We will recheck his echo.  No signs of heart failure today.  I do wonder if this is related to volume from dialysis.  Echo will give [name] better insight into his volume status.  His EKG is normal.  We are pursuing a stress test as above.  3. Mixed hyperlipidemia -Diabetic.  On Lipitor 40 mg daily.  Will continue this.  Triglycerides are elevated due to uncontrolled diabetes.   Shared Decision Making/Informed Consent The risks [chest pain, shortness of breath, cardiac arrhythmias, dizziness, blood pressure fluctuations, myocardial infarction, stroke/transient ischemic attack, nausea, vomiting, allergic reaction, radiation exposure, metallic taste sensation and life-threatening complications (estimated to be 1 in 10,000)], benefits (risk stratification, diagnosing coronary artery disease, treatment guidance) and alternatives of a nuclear stress test were discussed in detail with Eric Glenn and he agrees to proceed.  Disposition: Return if symptoms worsen or fail to improve.  Medication Adjustments/Labs and Tests Ordered: Current medicines are reviewed at length with the patient today.  Concerns regarding  medicines are outlined above.  Orders Placed This Encounter  Procedures   Cardiac Stress Test: Informed Consent Details: Physician/Practitioner Attestation; Transcribe to consent form and obtain patient signature   MYOCARDIAL PERFUSION IMAGING   EKG 12-Lead   ECHOCARDIOGRAM COMPLETE   No orders of the defined types were placed in this encounter.   Patient Instructions  Medication Instructions:  The current medical regimen is effective;  continue present plan and medications.  *If you need a refill on your cardiac medications before your next appointment, please call your pharmacy*   Testing/Procedures: Echocardiogram - Your physician has requested that you have an echocardiogram. Echocardiography is a painless test that uses sound waves to create images of your heart. It provides your doctor with information about the size and shape of your heart and how well your heart's chambers and valves are working. This procedure takes approximately one hour. There are no restrictions for this procedure.   Your physician has requested that you have a lexiscan myoview. For further information please visit [email or web address] Please follow instruction sheet, as given.   Follow-Up: At [facility], you and your health needs are our priority.  As part of our continuing mission to provide you with exceptional heart care, we have created designated Provider Care Teams.  These Care Teams include your primary Cardiologist (physician) and Advanced Practice Providers (APPs -  Physician Assistants and Nurse Practitioners) who all work together to provide you with the care you need, when you need it.  We recommend signing up for the patient portal called "MyChart".  Sign up information is provided  on this After Visit Summary.  MyChart is used to connect with patients for Virtual Visits (Telemedicine).  Patients are able to view lab/test results, encounter notes, upcoming appointments, etc.  Non-urgent  messages can be sent to your provider as well.   To learn more about what you can do with MyChart, go to [email or web address].    Your next appointment:   As needed  Provider:   Lennie Odor, MD      Signed, Lenna Gilford. Flora Lipps, MD, [facility]  [facility]  [address] [place], [place] [address] [phone]  12/15/2022 12:12 PM

## 2022-12-15 ENCOUNTER — Ambulatory Visit: Payer: Self-pay | Attending: Cardiovascular Disease | Admitting: Cardiovascular Disease

## 2022-12-15 ENCOUNTER — Encounter: Payer: Self-pay | Admitting: Cardiovascular Disease

## 2022-12-15 VITALS — BP 156/80 | HR 78 | Ht 64.0 in | Wt 214.0 lb

## 2022-12-15 DIAGNOSIS — E782 Mixed hyperlipidemia: Secondary | ICD-10-CM

## 2022-12-15 DIAGNOSIS — R0602 Shortness of breath: Secondary | ICD-10-CM

## 2022-12-15 DIAGNOSIS — R072 Precordial pain: Secondary | ICD-10-CM

## 2022-12-15 NOTE — Patient Instructions (Signed)
Medication Instructions:  The current medical regimen is effective;  continue present plan and medications.  *If you need a refill on your cardiac medications before your next appointment, please call your pharmacy*   Testing/Procedures: Echocardiogram - Your physician has requested that you have an echocardiogram. Echocardiography is a painless test that uses sound waves to create images of your heart. It provides your doctor with information about the size and shape of your heart and how well your heart's chambers and valves are working. This procedure takes approximately one hour. There are no restrictions for this procedure.   Your physician has requested that you have a lexiscan myoview. For further information please visit [email or web address]. Please follow instruction sheet, as given.    Follow-Up: At [facility], you and your health needs are our priority.  As part of our continuing mission to provide you with exceptional heart care, we have created designated Provider Care Teams.  These Care Teams include your primary Cardiologist (physician) and Advanced Practice Providers (APPs -  Physician Assistants and Nurse Practitioners) who all work together to provide you with the care you need, when you need it.  We recommend signing up for the patient portal called "MyChart".  Sign up information is provided on this After Visit Summary.  MyChart is used to connect with patients for Virtual Visits (Telemedicine).  Patients are able to view lab/test results, encounter notes, upcoming appointments, etc.  Non-urgent messages can be sent to your provider as well.   To learn more about what you can do with MyChart, go to [email or web address].    Your next appointment:   As needed  Provider:   Franklin O'Neal, MD    

## 2022-12-17 ENCOUNTER — Encounter: Payer: Self-pay | Admitting: Student

## 2022-12-20 ENCOUNTER — Encounter (HOSPITAL_COMMUNITY): Payer: Self-pay | Admitting: Nephrology

## 2022-12-20 ENCOUNTER — Other Ambulatory Visit (HOSPITAL_COMMUNITY): Payer: Self-pay

## 2022-12-20 ENCOUNTER — Telehealth: Payer: Self-pay | Admitting: Licensed Clinical Social Worker

## 2022-12-20 NOTE — Telephone Encounter (Signed)
H&V Care Navigation CSW Progress Note  Clinical Social Worker contacted patient by phone to f/u on assistance applications. Pt has emergency medicaid for dialysis but not eligible for full Medicaid benefits at this time. No answer at number [phone] with assistance of Spanish language interpreter Eric Glenn [phone] I left vociemail. Note that number is for Eric Glenn, niece, DPR on file. Will re-attempt as able.   Patient is participating in a Managed Medicaid Plan:  No, self pay only.   SDOH Screenings   Food Insecurity: No Food Insecurity (05/28/2022)  Housing: Low Risk  (05/28/2022)  Utilities: Not At Risk (05/28/2022)  Depression (PHQ2-9): High Risk (06/18/2022)  Social Connections: Moderately Isolated (05/28/2022)  Tobacco Use: Medium Risk (12/15/2022)    Eric Glenn, MSW, LCSW Clinical Social Worker II [facility] Heart/Vascular Care Navigation  [phone]- work cell phone (preferred) [phone]- desk phone

## 2022-12-29 ENCOUNTER — Encounter: Payer: Self-pay | Admitting: Licensed Clinical Social Worker

## 2022-12-29 ENCOUNTER — Institutional Professional Consult (permissible substitution): Payer: Self-pay | Admitting: Licensed Clinical Social Worker

## 2022-12-29 NOTE — Progress Notes (Signed)
Patient no-showed today's appointment;  Patient will need to reschedule appointment by calling Internal medicine center [phone].  Dominik Lauricella, MSW, LCSW-A [facility] Clinician [place]  Internal Medicine Center Direct Dial:[phone]  Fax [phone] Main Office Phone: [phone] [address] [place], [place] [address] Website: [facility]  Primary Care  [place], [place]  [place]   

## 2023-01-05 ENCOUNTER — Telehealth: Payer: Self-pay | Admitting: Internal Medicine

## 2023-01-05 ENCOUNTER — Other Ambulatory Visit (HOSPITAL_COMMUNITY): Payer: Self-pay

## 2023-01-05 DIAGNOSIS — E119 Type 2 diabetes mellitus without complications: Secondary | ICD-10-CM

## 2023-01-06 ENCOUNTER — Other Ambulatory Visit (HOSPITAL_COMMUNITY): Payer: Self-pay

## 2023-01-06 ENCOUNTER — Encounter (HOSPITAL_COMMUNITY): Payer: Self-pay | Admitting: Nephrology

## 2023-01-12 ENCOUNTER — Telehealth (HOSPITAL_COMMUNITY): Payer: Self-pay | Admitting: *Deleted

## 2023-01-12 NOTE — Telephone Encounter (Signed)
Patient's niece given detailed instructions per Myocardial Perfusion Study Information Sheet for the test on 01/17/2023 at 10:15. Patient notified to arrive 15 minutes early and that it is imperative to arrive on time for appointment to keep from having the test rescheduled.  If you need to cancel or reschedule your appointment, please call the office within 24 hours of your appointment. . Patient's niece verbalized understanding.Daneil Dolin

## 2023-01-17 ENCOUNTER — Ambulatory Visit (HOSPITAL_COMMUNITY): Payer: Self-pay | Attending: Cardiovascular Disease

## 2023-01-17 ENCOUNTER — Ambulatory Visit (HOSPITAL_BASED_OUTPATIENT_CLINIC_OR_DEPARTMENT_OTHER): Payer: Self-pay

## 2023-01-17 DIAGNOSIS — R0602 Shortness of breath: Secondary | ICD-10-CM

## 2023-01-17 DIAGNOSIS — R072 Precordial pain: Secondary | ICD-10-CM

## 2023-01-17 LAB — ECHOCARDIOGRAM COMPLETE
Area-P 1/2: 2.79 cm2
S' Lateral: 3 cm

## 2023-01-17 LAB — MYOCARDIAL PERFUSION IMAGING
LV dias vol: 127 mL (ref 62–150)
LV sys vol: 53 mL
Nuc Stress EF: 58 %
Peak HR: 66 {beats}/min
Rest HR: 62 {beats}/min
Rest Nuclear Isotope Dose: 10.8 mCi
SDS: 1
SRS: 0
SSS: 1
ST Depression (mm): 0 mm
Stress Nuclear Isotope Dose: 32.5 mCi
TID: 0.99

## 2023-01-17 MED ORDER — REGADENOSON 0.4 MG/5ML IV SOLN
0.4000 mg | Freq: Once | INTRAVENOUS | Status: AC
Start: 2023-01-17 — End: ?

## 2023-01-17 MED ORDER — TECHNETIUM TC 99M TETROFOSMIN IV KIT: 32.5000 | PACK | Freq: Once | INTRAVENOUS | Status: AC | PRN

## 2023-01-17 MED ORDER — TECHNETIUM TC 99M TETROFOSMIN IV KIT
10.8000 | PACK | Freq: Once | INTRAVENOUS | Status: AC | PRN
  Administered 2023-01-17: 10.8 via INTRAVENOUS

## 2023-01-18 ENCOUNTER — Encounter (HOSPITAL_COMMUNITY): Payer: Self-pay | Admitting: Nephrology

## 2023-01-18 ENCOUNTER — Other Ambulatory Visit (HOSPITAL_COMMUNITY): Payer: Self-pay

## 2023-01-19 ENCOUNTER — Telehealth: Payer: Self-pay | Admitting: *Deleted

## 2023-01-19 NOTE — Telephone Encounter (Signed)
-----   Message from Sande Rives, MD sent at 01/17/2023  9:43 PM EDT ----- Normal stress test. My chart. -W

## 2023-01-19 NOTE — Telephone Encounter (Signed)
-----   Message from Sande Rives, MD sent at 01/17/2023  9:42 PM EDT ----- Normal echo. My chart. -W

## 2023-01-19 NOTE — Telephone Encounter (Signed)
Called used spanish interpreter  Lupa- ID# [id number] . Left detail message on voicemail per  DPR.  Tests were normal if have any question may call the office

## 2023-02-08 ENCOUNTER — Ambulatory Visit: Payer: Self-pay | Admitting: Student

## 2023-02-08 VITALS — BP 129/57 | HR 59 | Temp 98.1°F | Ht 64.0 in | Wt 213.5 lb

## 2023-02-08 DIAGNOSIS — N186 End stage renal disease: Secondary | ICD-10-CM

## 2023-02-08 DIAGNOSIS — R300 Dysuria: Secondary | ICD-10-CM | POA: Insufficient documentation

## 2023-02-08 DIAGNOSIS — R109 Unspecified abdominal pain: Secondary | ICD-10-CM | POA: Insufficient documentation

## 2023-02-08 DIAGNOSIS — R1033 Periumbilical pain: Secondary | ICD-10-CM

## 2023-02-08 NOTE — Patient Instructions (Addendum)
Thank you, [name] for allowing [name] to provide your care today. Today we discussed abdominal pain.    I have ordered the following labs for you:  Lab Orders         CMP14 + Anion Gap         Urinalysis, Reflex Microscopic      Referrals ordered today:   Referral Orders  No referral(s) requested today     I have ordered the following medication/changed the following medications:   Stop the following medications: There are no discontinued medications.   Start the following medications: Miralax     Follow up:  2 weeks as needed for pain    We look forward to seeing you next time. Please call our clinic at [phone] if you have any questions or concerns. The best time to call is Monday-Friday from 9am-4pm, but there is someone available 24/7. If after hours or the weekend, call the main hospital number and ask for the Internal Medicine Resident On-Call. If you need medication refills, please notify your pharmacy one week in advance and they will send [name] a request.   Thank you for trusting me with your care. Wishing you the best!  Lovie Macadamia MD [facility]

## 2023-02-08 NOTE — Assessment & Plan Note (Signed)
In addition to the patient's abdominal pain, he also complains of frequency, saying that he is peeing more often than before (though he has ESRD and pees very little at baseline).  He also complains of an urge to pee and discomfort of the bladder with the sensation to pee, even though he cannot.  He denies any blood in his urine or pain with urination.  Plan: Patient unable to provide urine sample today If CMP negative, lab visit for UA.

## 2023-02-08 NOTE — Progress Notes (Signed)
Subjective:  CC: Abdominal pain  HPI:  [name] is a 57 y.o. male with a past medical history stated below and presents today for abdominal pain. Please see problem based assessment and plan for additional details.  Past Medical History:  Diagnosis Date   Acute infective otitis externa, right    Anemia    Diabetes (HCC)    Diabetes mellitus type 2, controlled (HCC) 08/04/2015   Diabetic retinopathy (HCC)    Diabetic retinopathy (HCC)    Dialysis patient (HCC)    Diarrhea    ESRD (end stage renal disease) (HCC) 03/10/2016   High cholesterol    Hypertension    Nausea 08/2017   Ulcer of right foot, limited to breakdown of skin (HCC) 11/28/2019    Current Outpatient Medications on File Prior to Visit  Medication Sig Dispense Refill   acetaminophen (TYLENOL) 325 MG tablet Take 325-650 mg by mouth every 6 (six) hours as needed for mild pain or headache.     albuterol (VENTOLIN HFA) 108 (90 Base) MCG/ACT inhaler Inhale 1-2 puffs into the lungs every 6 (six) hours as needed for wheezing or shortness of breath. 18 g 0   amLODipine (NORVASC) 10 MG tablet Take 1 tablet (10 mg total) by mouth daily. 90 tablet 3   atorvastatin (LIPITOR) 40 MG tablet Take 1 tablet (40 mg total) by mouth daily. 30 tablet 3   carvedilol (COREG) 12.5 MG tablet Take 1 tablet (12.5 mg total) by mouth 2 (two) times daily. 60 tablet 6   Continuous Blood Gluc Sensor (FREESTYLE LIBRE 3 SENSOR) MISC Place 1 sensor on the skin every 14 days and use to check glucose continuously. 2 each 10   glucose blood (CONTOUR NEXT TEST) test strip Test blood glucose 4 times daily 100 each 11   hydrALAZINE (APRESOLINE) 100 MG tablet Take 1 tablet (100 mg total) by mouth 3 (three) times daily. 90 tablet 3   insulin aspart (NOVOLOG FLEXPEN) 100 UNIT/ML FlexPen Inject 8 Units into the skin 3 (three) times daily with meals. 15 mL 11   insulin glargine (LANTUS SOLOSTAR) 100 UNIT/ML Solostar Pen Inject 30 Units into the  skin 2 (two) times daily. 21 mL 6   Insulin Pen Needle 32G X 4 MM MISC USE AS DIRECTED TWICE DAILY. 100 each 12   Lancets MISC Test blood glucose 4 times daily 100 each 11   Lasmiditan Succinate (REYVOW) 50 MG TABS Take 1 tablet by mouth as needed (for migraine). 12 tablet 5   meclizine (ANTIVERT) 25 MG tablet Take 1 tablet (25 mg total) by mouth 2 (two) times daily as needed for vertigo/dizziness 60 tablet 6   multivitamin (RENA-VIT) TABS tablet Take 1 tablet by mouth daily. 60 tablet 5   Omega-3 Fatty Acids (FISH OIL) 1000 MG CAPS Take 1,000 mg by mouth in the morning and at bedtime.     senna-docusate (SENOKOT S) 8.6-50 MG tablet Take 1 tablet by mouth daily. 30 tablet 0   topiramate (TOPAMAX) 25 MG tablet Take 1 tablet (25 mg) in the morning and 2 tablets (50 mg) at bedtime for one week. Then increase to 2 pills (50 mg) twice a day. Take 2 tablets after dialysis on TTS. 120 tablet 6   Current Facility-Administered Medications on File Prior to Visit  Medication Dose Route Frequency Provider Last Rate Last Admin   regadenoson (LEXISCAN) injection SOLN 0.4 mg  0.4 mg Intravenous Once Pricilla Riffle, MD       technetium  tetrofosmin (TC-MYOVIEW) injection 32.5 millicurie  32.5 millicurie Intravenous Once PRN Pricilla Riffle, MD        Review of Systems: ROS negative except for what is noted on the assessment and plan.  Objective:   Vitals:   02/08/23 1541  BP: (!) 129/57  Pulse: (!) 59  Temp: 98.1 F (36.7 C)  TempSrc: Oral  SpO2: 99%  Weight: 213 lb 8 oz (96.8 kg)  Height: 5\' 4"  (1.626 m)    Physical Exam: Constitutional: well-appearing no acute distress HENT: normocephalic atraumatic, mucous membranes moist Neck: supple Pulmonary/Chest: normal work of breathing on room air, lungs clear to auscultation bilaterally Abdominal: soft, non-distended Slight pain with palpation of the suprapubic region.  No pain of the right upper quadrant or periumbilical region with deep  palpation. Negative Carnett's sign No masses or hernias appreciated in the periumbilical region with Valsalva. No masses or hernias appreciated within the inguinal canal with Valsalva. MSK: normal bulk and tone Skin: warm and dry Psych: Normal mood and affect   Assessment & Plan:  Abdominal pain Patient presents with a 2-week history of abdominal pain.  He feels that this began a couple days after he had a myocardial perfusion study in which they pressed on his abdomen during the exam.  He says the pain begins in the right testicular/suprapubic area and travels into the periumbilical area.  He rates the pain as a constant 4 out of 10 pain which is made worse following eating or when he is full.  He sometimes attributes it to straining or defecating.  He has a history of umbilical hernia which is repaired.  He says the pain is similar to the pain he had when he had an umbilical hernia, but states that he has never had this type of pain before.  He denies melanotic stools, though he endorses constipation and occasional diarrhea.  Assessment and plan: Differential diagnosis would include urinary tract infection, small umbilical or inguinal hernia, abdominal wall MSK strain, or chronic mesenteric ischemia. Less concern for abdominal wall strain or hematoma given negative Carnett's sign, and no pain with palpation of the abdominal wall. Less concern for large umbilical or inguinal hernia given negative exam findings. History atypical for chronic mesenteric ischemia as patient has pain during rest, though it is worsened postprandially.  Pain out of proportion with exam also may favor this diagnosis. Given the patient's dysuria it is very possible that possible that cystitis may be causing this pain.  Given patient's postprandial pain would like to obtain a CMP to rule out hepatic or biliary pathology. Patient's abdominal pain may be related to his dysuria (please see assessment and plan for dysuria  for more information).  The patient is ESRD and produces very little urine, he is unable to provide a urine sample today.  We will put in a future order for urinalysis which she completed when possible. Discussed starting MiraLAX with the patient in the setting of constipation.     Dysuria In addition to the patient's abdominal pain, he also complains of frequency, saying that he is peeing more often than before (though he has ESRD and pees very little at baseline).  He also complains of an urge to pee and discomfort of the bladder with the sensation to pee, even though he cannot.  He denies any blood in his urine or pain with urination.  Plan: Patient unable to provide urine sample today If CMP negative, lab visit for UA.   Patient seen with  Dr. Elliot Cousin MD [facility]  PGY-1 Pager: [phone]  Phone: [phone] Date 02/08/2023  Time 4:54 PM

## 2023-02-08 NOTE — Assessment & Plan Note (Addendum)
Patient presents with a 2-week history of abdominal pain.  He feels that this began a couple days after he had a myocardial perfusion study in which they pressed on his abdomen during the exam.  He says the pain begins in the right testicular/suprapubic area and travels into the periumbilical area.  He rates the pain as a constant 4 out of 10 pain which is made worse following eating or when he is full.  He sometimes attributes it to straining or defecating.  He has a history of umbilical hernia which is repaired.  He says the pain is similar to the pain he had when he had an umbilical hernia, but states that he has never had this type of pain before.  He denies melanotic stools, though he endorses constipation and occasional diarrhea.  Assessment and plan: Differential diagnosis would include urinary tract infection, small umbilical or inguinal hernia, abdominal wall MSK strain, or chronic mesenteric ischemia. Less concern for abdominal wall strain or hematoma given negative Carnett's sign, and no pain with palpation of the abdominal wall. Less concern for large umbilical or inguinal hernia given negative exam findings. History atypical for chronic mesenteric ischemia as patient has pain during rest, though it is worsened postprandially.  Pain out of proportion with exam also may favor this diagnosis. Given the patient's dysuria it is very possible that possible that cystitis may be causing this pain.  Given patient's postprandial pain would like to obtain a CMP to rule out hepatic or biliary pathology. Patient's abdominal pain may be related to his dysuria (please see assessment and plan for dysuria for more information).  The patient is ESRD and produces very little urine, he is unable to provide a urine sample today.  We will put in a future order for urinalysis which she completed when possible. Discussed starting MiraLAX with the patient in the setting of constipation.

## 2023-02-09 LAB — CMP14 + ANION GAP
ALT: 24 IU/L (ref 0–44)
AST: 14 IU/L (ref 0–40)
Albumin: 4.4 g/dL (ref 3.8–4.9)
Alkaline Phosphatase: 90 IU/L (ref 44–121)
Anion Gap: 17 mmol/L (ref 10.0–18.0)
BUN/Creatinine Ratio: 4 — ABNORMAL LOW (ref 9–20)
BUN: 21 mg/dL (ref 6–24)
Bilirubin Total: 0.3 mg/dL (ref 0.0–1.2)
CO2: 28 mmol/L (ref 20–29)
Calcium: 8.5 mg/dL — ABNORMAL LOW (ref 8.7–10.2)
Chloride: 91 mmol/L — ABNORMAL LOW (ref 96–106)
Creatinine, Ser: 4.67 mg/dL — ABNORMAL HIGH (ref 0.76–1.27)
Globulin, Total: 3 g/dL (ref 1.5–4.5)
Glucose: 318 mg/dL — ABNORMAL HIGH (ref 70–99)
Potassium: 3.9 mmol/L (ref 3.5–5.2)
Sodium: 136 mmol/L (ref 134–144)
Total Protein: 7.4 g/dL (ref 6.0–8.5)
eGFR: 14 mL/min/{1.73_m2} — ABNORMAL LOW (ref >59)

## 2023-02-09 NOTE — Progress Notes (Signed)
Internal Medicine Clinic Attending  I was physically present during the key portions of the resident provided service and participated in the medical decision making of patient's management care. I reviewed pertinent patient test results.  The assessment, diagnosis, and plan were formulated together and I agree with the documentation in the resident's note.  Mercie Eon, MD      I'm not 100% sure what the etiology of this patient's abdominal pain is.  Eric Glenn is a 57yo spanish-speaking man with ESRD on HD who is presenting with 2 weeks of lower abdominal pain that started after his myocardial perfusion scan when the technicians pressed on his abdomen. The pain is located below his umbilicus and radiates to the left groin. It is associated with increased urinary urgency, but no dysuria or hematuria. It is also associated with feeling full after eating, but no nausea, vomiting, or bloody bowel movements. He has chronic constipation which has not changed lately.   VSS. On my exam, his abdomen is soft, with mild tenderness to deep palpation in LLQ & RLQ, but overall unremarkable. No ventral or inguinal hernias present. No CVAT.   Labs have resulted with normal LFTs and bilirubin.   I agree that UTI is top of my ddx, so we will try again to obtain a U/A and treat for UTI is positive. I don't think he warrants urgent abdominal imaging.

## 2023-02-16 ENCOUNTER — Encounter (HOSPITAL_COMMUNITY): Payer: Self-pay | Admitting: Nephrology

## 2023-02-16 ENCOUNTER — Other Ambulatory Visit (HOSPITAL_COMMUNITY): Payer: Self-pay

## 2023-02-17 ENCOUNTER — Encounter: Payer: Self-pay | Admitting: Emergency Medicine

## 2023-02-17 ENCOUNTER — Other Ambulatory Visit (HOSPITAL_COMMUNITY): Payer: Self-pay

## 2023-02-17 ENCOUNTER — Ambulatory Visit
Admission: EM | Admit: 2023-02-17 | Discharge: 2023-02-17 | Disposition: A | Discharge status: Home / Self Care | Payer: Self-pay

## 2023-02-17 ENCOUNTER — Telehealth: Payer: Self-pay | Admitting: Student

## 2023-02-17 ENCOUNTER — Encounter: Payer: Self-pay | Admitting: Student

## 2023-02-17 DIAGNOSIS — R58 Hemorrhage, not elsewhere classified: Secondary | ICD-10-CM

## 2023-02-17 DIAGNOSIS — R300 Dysuria: Secondary | ICD-10-CM

## 2023-02-17 NOTE — Telephone Encounter (Signed)
I have attempted to reach this patient several times with a telephone interpreter since their visit in order to discuss lab results and next steps. We have left voicemail with callback instructions. I have placed a future order for a UA to be completed to rule out UTI. I have also written a letter to be mailed to the patient with their lab results and instructions regarding returning to complete a UA.

## 2023-02-17 NOTE — ED Provider Notes (Signed)
[facility]    CSN: [id number] Arrival date & time: 02/17/23  1357      History   Chief Complaint Chief Complaint  Patient presents with   Fistula Problem    HPI Lal Harpenau is a 57 y.o. male.   Patient presents with family member who helps provide history given patient speaks [place].  Family member reports that he had dialysis treatment today.  Prior to leaving, his fistula started bleeding again.  The clinical staff at the dialysis treatment center got it to stop bleeding but when they returned home, the bleeding restarted and was profuse per family report.  He does not take any blood thinning medications.     Past Medical History:  Diagnosis Date   Acute infective otitis externa, right    Anemia    Diabetes (HCC)    Diabetes mellitus type 2, controlled (HCC) 08/04/2015   Diabetic retinopathy (HCC)    Diabetic retinopathy (HCC)    Dialysis patient (HCC)    Diarrhea    ESRD (end stage renal disease) (HCC) 03/10/2016   High cholesterol    Hypertension    Nausea 08/2017   Ulcer of right foot, limited to breakdown of skin (HCC) 11/28/2019    Patient Active Problem List   Diagnosis Date Noted   Abdominal pain 02/08/2023   Dysuria 02/08/2023   Dyspnea 11/19/2022   Depression 11/19/2022   Chronic migraine without aura, intractable, without status migrainosus 05/21/2022   Dizziness 05/21/2022   Ulcer of left foot (HCC) 10/19/2021   Subungual hematoma of great toe of right foot 05/22/2021   Healthcare maintenance 01/28/2021   Onychomycosis 12/14/2020   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 09/27/2019   ESRD (end stage renal disease) (HCC) 03/10/2016   Asthma 10/01/2015   Obesity 10/01/2015   Hyperlipidemia 08/19/2015   Essential hypertension 08/04/2015   Type 2 diabetes mellitus with other specified complication (HCC) 08/04/2015   Diabetic retinopathy associated with type 2 diabetes mellitus (HCC)  08/04/2015    Past Surgical History:  Procedure Laterality Date   A/V FISTULAGRAM Right 08/12/2021   Procedure: A/V FISTULAGRAM;  Surgeon: Victorino Sparrow, MD;  Location[facility] INVASIVE CV LAB;  Service: Cardiovascular;  Laterality: Right;   AV FISTULA PLACEMENT Right 09/04/2015   Procedure: ARTERIOVENOUS (AV) FISTULA CREATION;  Surgeon: Chuck Hint, MD;  Location: [facility] OR;  Service: Vascular;  Laterality: Right;   AV FISTULA PLACEMENT Right 01/22/2016   Procedure: RIGHT UPPER ARM BRACHIOCEPHALIC ARTERIOVENOUS (AV) FISTULA CREATION;  Surgeon: Chuck Hint, MD;  Location: [facility] OR;  Service: Vascular;  Laterality: Right;   IR AV DIALY SHUNT INTRO NEEDLE/INTRACATH INITIAL W/PTA/IMG RIGHT Right 03/01/2022   IR DIALY SHUNT INTRO NEEDLE/INTRACATH INITIAL W/IMG RIGHT Right 12/08/2018   IR DIALY SHUNT INTRO NEEDLE/INTRACATH INITIAL W/IMG RIGHT Right 08/18/2020   IR US GUIDE VASC ACCESS RIGHT  03/01/2022   LIGATION OF ARTERIOVENOUS  FISTULA Right 01/22/2016   Procedure: LIGATION OF RIGHT FOREARM ARTERIOVENOUS  FISTULA;  Surgeon: Chuck Hint, MD;  Location: [facility] OR;  Service: Vascular;  Laterality: Right;   PERIPHERAL VASCULAR BALLOON ANGIOPLASTY Right 08/12/2021   Procedure: PERIPHERAL VASCULAR BALLOON ANGIOPLASTY;  Surgeon: Victorino Sparrow, MD;  Location: [facility] INVASIVE CV LAB;  Service: Cardiovascular;  Laterality: Right;  upper arm PTA   PERIPHERAL VASCULAR CATHETERIZATION N/A 01/12/2016   Procedure: Fistulagram;  Surgeon: Chuck Hint, MD;  Location: [facility] INVASIVE CV LAB;  Service: Cardiovascular;  Laterality: N/A;  UMBILICAL HERNIA REPAIR         Home Medications    Prior to Admission medications   Medication Sig Start Date End Date Taking? Authorizing Provider  acetaminophen (TYLENOL) 325 MG tablet Take 325-650 mg by mouth every 6 (six) hours as needed for mild pain or headache.    [provider]  albuterol (VENTOLIN HFA) 108 (90 Base) MCG/ACT inhaler Inhale 1-2  puffs into the lungs every 6 (six) hours as needed for wheezing or shortness of breath. 03/24/21   Ivette Loyal, NP  amLODipine (NORVASC) 10 MG tablet Take 1 tablet (10 mg total) by mouth daily. 03/17/22   Atway, Rayann N, DO  atorvastatin (LIPITOR) 40 MG tablet Take 1 tablet (40 mg total) by mouth daily. 11/19/22 11/19/23  Lyndle Herrlich, MD  carvedilol (COREG) 12.5 MG tablet Take 1 tablet (12.5 mg total) by mouth 2 (two) times daily. 08/30/22   Atway, Rayann N, DO  Continuous Blood Gluc Sensor (FREESTYLE LIBRE 3 SENSOR) MISC Place 1 sensor on the skin every 14 days and use to check glucose continuously. 10/19/21   Doran Stabler, DO  glucose blood (CONTOUR NEXT TEST) test strip Test blood glucose 4 times daily 01/01/19   Burna Cash, MD  hydrALAZINE (APRESOLINE) 100 MG tablet Take 1 tablet (100 mg total) by mouth 3 (three) times daily. 09/24/22   Atway, Rayann N, DO  insulin aspart (NOVOLOG FLEXPEN) 100 UNIT/ML FlexPen Inject 8 Units into the skin 3 (three) times daily with meals. 11/19/22   Lyndle Herrlich, MD  insulin glargine (LANTUS SOLOSTAR) 100 UNIT/ML Solostar Pen Inject 30 Units into the skin 2 (two) times daily. 09/15/22   Atway, Rayann N, DO  Insulin Pen Needle 32G X 4 MM MISC USE AS DIRECTED TWICE DAILY. 06/06/20 06/06/21  Theotis Barrio, MD  Lancets MISC Test blood glucose 4 times daily 01/01/19   Burna Cash, MD  Lasmiditan Succinate (REYVOW) 50 MG TABS Take 1 tablet by mouth as needed (for migraine). 08/04/22   Ocie Doyne, MD  meclizine (ANTIVERT) 25 MG tablet Take 1 tablet (25 mg total) by mouth 2 (two) times daily as needed for vertigo/dizziness 03/04/22     multivitamin (RENA-VIT) TABS tablet Take 1 tablet by mouth daily. 08/04/18   Santos-Sanchez, Chelsea Primus, MD  Omega-3 Fatty Acids (FISH OIL) 1000 MG CAPS Take 1,000 mg by mouth in the morning and at bedtime.    [provider]  senna-docusate (SENOKOT S) 8.6-50 MG tablet Take 1 tablet by mouth daily.  05/28/19   Theotis Barrio, MD  topiramate (TOPAMAX) 25 MG tablet Take 1 tablet (25 mg) in the morning and 2 tablets (50 mg) at bedtime for one week. Then increase to 2 pills (50 mg) twice a day. Take 2 tablets after dialysis on TTS. 08/04/22   Ocie Doyne, MD    Family History Family History  Problem Relation Age of Onset   Hypertension Mother    Hyperlipidemia Mother    Diabetes Mellitus II Sister     Social History Social History   Tobacco Use   Smoking status: Former    Current packs/day: 0.00    Types: Cigarettes    Quit date: 10/22/2003    Years since quitting: 19.3   Smokeless tobacco: Never  Vaping Use   Vaping status: Never Used  Substance Use Topics   Alcohol use: No    Alcohol/week: 0.0 standard drinks of alcohol   Drug use: No     Allergies   Other,  Poractant alfa, Pork-derived products, and Penicillins   Review of Systems Review of Systems Per HPI  Physical Exam Triage Vital Signs ED Triage Vitals [02/17/23 1412]  Encounter Vitals Group     BP (!) 142/69     Systolic BP Percentile      Diastolic BP Percentile      Pulse Rate 62     Resp      Temp 98.2 F (36.8 C)     Temp Source Oral     SpO2 94 %     Weight      Height      Head Circumference      Peak Flow      Pain Score 0     Pain Loc      Pain Education      Exclude from Growth Chart    No data found.  Updated Vital Signs BP (!) 142/69 (BP Location: Left Arm)   Pulse 62   Temp 98.2 F (36.8 C) (Oral)   SpO2 94%   Visual Acuity Right Eye Distance:   Left Eye Distance:   Bilateral Distance:    Right Eye Near:   Left Eye Near:    Bilateral Near:     Physical Exam Constitutional:      General: He is not in acute distress.    Appearance: Normal appearance. He is not toxic-appearing or diaphoretic.  HENT:     Head: Normocephalic and atraumatic.  Eyes:     Extraocular Movements: Extraocular movements intact.     Conjunctiva/sclera: Conjunctivae normal.  Pulmonary:      Effort: Pulmonary effort is normal.  Skin:    Comments: Dialysis fistula site present to right upper arm.  No obvious bleeding noted on physical exam.  Neurological:     General: No focal deficit present.     Mental Status: He is alert and oriented to person, place, and time. Mental status is at baseline.  Psychiatric:        Mood and Affect: Mood normal.        Behavior: Behavior normal.        Thought Content: Thought content normal.        Judgment: Judgment normal.      UC Treatments / Results  Labs (all labs ordered are listed, but only abnormal results are displayed) Labs Reviewed - No data to display  EKG   Radiology No results found.  Procedures Procedures (including critical care time)  Medications Ordered in UC Medications - No data to display  Initial Impression / Assessment and Plan / UC Course  I have reviewed the triage vital signs and the nursing notes.  Pertinent labs & imaging results that were available during my care of the patient were reviewed by me and considered in my medical decision making (see chart for details).     On provider examination, the fistula was no longer bleeding and appeared normal on exam.  Dressing reapplied by clinical staff and family and patient were advised to monitor closely for any bleeding that may restart.  Advised strict follow-up and ER precautions.  They verbalized understanding and were agreeable with plan. Final Clinical Impressions(s) / UC Diagnoses   Final diagnoses:  Bleeding from venipuncture site     Discharge Instructions      Monitor closely and follow-up with any further concerns.    ED Prescriptions   None    PDMP not reviewed this encounter.   Gustavus Bryant, [place] 02/17/23  1432  

## 2023-02-17 NOTE — Discharge Instructions (Signed)
Monitor closely and follow-up with any further concerns.

## 2023-02-17 NOTE — ED Triage Notes (Signed)
Pt presents to UC due to his dialysis fistula bleeding. States he went to dialysis today and the dialysis center was able to control bleeding until the patient went home and bleeding became worse.

## 2023-02-18 ENCOUNTER — Encounter (HOSPITAL_COMMUNITY): Payer: Self-pay | Admitting: Nephrology

## 2023-02-18 ENCOUNTER — Other Ambulatory Visit (HOSPITAL_COMMUNITY): Payer: Self-pay

## 2023-02-25 ENCOUNTER — Other Ambulatory Visit (HOSPITAL_COMMUNITY): Payer: Self-pay

## 2023-02-25 ENCOUNTER — Other Ambulatory Visit: Payer: Self-pay

## 2023-02-25 ENCOUNTER — Encounter (HOSPITAL_COMMUNITY): Payer: Self-pay | Admitting: Nephrology

## 2023-03-09 ENCOUNTER — Encounter: Payer: Self-pay | Admitting: Psychiatry

## 2023-03-09 ENCOUNTER — Ambulatory Visit: Payer: Self-pay | Admitting: Psychiatry

## 2023-03-09 NOTE — Progress Notes (Deleted)
   CC:  headaches  Follow-up Visit  Last visit: 08/04/22  Brief HPI: 57 year old male with a history of HTN, asthma, DM2, ESRD on IHD, HLD  who follows in clinic for migraines.  At his last visit, Topamax was increased to 50 mg BID. He was started on lasmiditan for rescue.  Interval History: ***   Headache days per month: *** Migraine days per month*** Headache free days per month: ***  Current Headache Regimen: Preventative: *** Abortive: ***   Prior Therapies                                  Methocarbamol 500 mg BID Carvedilol 12.5 mg BID Lisinopril 2.5 mg daily Topamax 25 mg BID (25 mg after dialysis on IHD days)  Physical Exam:   Vital Signs: There were no vitals taken for this visit. GENERAL:  well appearing, in no acute distress, alert  SKIN:  Color, texture, turgor normal. No rashes or lesions HEAD:  Normocephalic/atraumatic. RESP: normal respiratory effort MSK:  No gross joint deformities.   NEUROLOGICAL: Mental Status: Alert, oriented to person, place and time, Follows commands, and Speech fluent and appropriate. Cranial Nerves: PERRL, face symmetric, no dysarthria, hearing grossly intact Motor: moves all extremities equally Gait: normal-based.  IMPRESSION: ***  PLAN: ***   Follow-up: ***  I spent a total of *** minutes on the date of the service. Headache education was done. Discussed lifestyle modification including increased oral hydration, decreased caffeine, exercise and stress management. Discussed treatment options including preventive and acute medications, natural supplements, and infusion therapy. Discussed medication overuse headache and to limit use of acute treatments to no more than 2 days/week or 10 days/month. Discussed medication side effects, adverse reactions and drug interactions. Written educational materials and patient instructions outlining all of the above were given.  Ocie Doyne, MD

## 2023-03-20 ENCOUNTER — Ambulatory Visit: Payer: Self-pay

## 2023-03-20 ENCOUNTER — Ambulatory Visit
Admission: EM | Admit: 2023-03-20 | Discharge: 2023-03-20 | Disposition: A | Discharge status: Home / Self Care | Payer: Self-pay | Attending: Physician Assistant | Admitting: Physician Assistant

## 2023-03-20 DIAGNOSIS — Z992 Dependence on renal dialysis: Secondary | ICD-10-CM | POA: Insufficient documentation

## 2023-03-20 DIAGNOSIS — N186 End stage renal disease: Secondary | ICD-10-CM | POA: Insufficient documentation

## 2023-03-20 DIAGNOSIS — U071 COVID-19: Secondary | ICD-10-CM | POA: Insufficient documentation

## 2023-03-20 DIAGNOSIS — J069 Acute upper respiratory infection, unspecified: Secondary | ICD-10-CM | POA: Insufficient documentation

## 2023-03-20 NOTE — ED Provider Notes (Signed)
[facility]    CSN: [id number] Arrival date & time: 03/20/23  2841      History   Chief Complaint Chief Complaint  Patient presents with   Fever    HPI Tedarius Claro is a 57 y.o. male.   Patient here today for evaluation of fever, congestion, scratchy throat, and cough.  He has had bodyaches as well.  Symptoms started 3 days ago.  Patient is a dialysis patient at baseline.  He has not had any vomiting or diarrhea.  He has taken Tylenol with mild relief.  The history is provided by the patient.    Past Medical History:  Diagnosis Date   Acute infective otitis externa, right    Anemia    Diabetes (HCC)    Diabetes mellitus type 2, controlled (HCC) 08/04/2015   Diabetic retinopathy (HCC)    Diabetic retinopathy (HCC)    Dialysis patient (HCC)    Diarrhea    ESRD (end stage renal disease) (HCC) 03/10/2016   High cholesterol    Hypertension    Nausea 08/2017   Ulcer of right foot, limited to breakdown of skin (HCC) 11/28/2019    Patient Active Problem List   Diagnosis Date Noted   Abdominal pain 02/08/2023   Dysuria 02/08/2023   Dyspnea 11/19/2022   Depression 11/19/2022   Chronic migraine without aura, intractable, without status migrainosus 05/21/2022   Dizziness 05/21/2022   Ulcer of left foot (HCC) 10/19/2021   Subungual hematoma of great toe of right foot 05/22/2021   Healthcare maintenance 01/28/2021   Onychomycosis 12/14/2020   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 09/27/2019   ESRD (end stage renal disease) (HCC) 03/10/2016   Asthma 10/01/2015   Obesity 10/01/2015   Hyperlipidemia 08/19/2015   Essential hypertension 08/04/2015   Type 2 diabetes mellitus with other specified complication (HCC) 08/04/2015   Diabetic retinopathy associated with type 2 diabetes mellitus (HCC) 08/04/2015    Past Surgical History:  Procedure Laterality Date   A/V FISTULAGRAM Right 08/12/2021   Procedure: A/V  FISTULAGRAM;  Surgeon: Victorino Sparrow, MD;  Location: [facility] INVASIVE CV LAB;  Service: Cardiovascular;  Laterality: Right;   AV FISTULA PLACEMENT Right 09/04/2015   Procedure: ARTERIOVENOUS (AV) FISTULA CREATION;  Surgeon: Chuck Hint, MD;  Location: [facility] OR;  Service: Vascular;  Laterality: Right;   AV FISTULA PLACEMENT Right 01/22/2016   Procedure: RIGHT UPPER ARM BRACHIOCEPHALIC ARTERIOVENOUS (AV) FISTULA CREATION;  Surgeon: Chuck Hint, MD;  Location: [facility] OR;  Service: Vascular;  Laterality: Right;   IR AV DIALY SHUNT INTRO NEEDLE/INTRACATH INITIAL W/PTA/IMG RIGHT Right 03/01/2022   IR DIALY SHUNT INTRO NEEDLE/INTRACATH INITIAL W/IMG RIGHT Right 12/08/2018   IR DIALY SHUNT INTRO NEEDLE/INTRACATH INITIAL W/IMG RIGHT Right 08/18/2020   IR US GUIDE VASC ACCESS RIGHT  03/01/2022   LIGATION OF ARTERIOVENOUS  FISTULA Right 01/22/2016   Procedure: LIGATION OF RIGHT FOREARM ARTERIOVENOUS  FISTULA;  Surgeon: Chuck Hint, MD;  Location: [facility] OR;  Service: Vascular;  Laterality: Right;   PERIPHERAL VASCULAR BALLOON ANGIOPLASTY Right 08/12/2021   Procedure: PERIPHERAL VASCULAR BALLOON ANGIOPLASTY;  Surgeon: Victorino Sparrow, MD;  Location: [facility] INVASIVE CV LAB;  Service: Cardiovascular;  Laterality: Right;  upper arm PTA   PERIPHERAL VASCULAR CATHETERIZATION N/A 01/12/2016   Procedure: Fistulagram;  Surgeon: Chuck Hint, MD;  Location: [facility] INVASIVE CV LAB;  Service: Cardiovascular;  Laterality: N/A;   UMBILICAL HERNIA REPAIR         Home  Medications    Prior to Admission medications   Medication Sig Start Date End Date Taking? Authorizing Provider  acetaminophen (TYLENOL) 325 MG tablet Take 325-650 mg by mouth every 6 (six) hours as needed for mild pain or headache. Last taken yesterday 325mg .   Yes [provider]  albuterol (VENTOLIN HFA) 108 (90 Base) MCG/ACT inhaler Inhale 1-2 puffs into the lungs every 6 (six) hours as needed for wheezing or shortness of breath.  03/24/21   Ivette Loyal, NP  amLODipine (NORVASC) 10 MG tablet Take 1 tablet (10 mg total) by mouth daily. 03/17/22   Atway, Rayann N, DO  atorvastatin (LIPITOR) 40 MG tablet Take 1 tablet (40 mg total) by mouth daily. 11/19/22 11/19/23  Lyndle Herrlich, MD  carvedilol (COREG) 12.5 MG tablet Take 1 tablet (12.5 mg total) by mouth 2 (two) times daily. 08/30/22   Atway, Rayann N, DO  Continuous Blood Gluc Sensor (FREESTYLE LIBRE 3 SENSOR) MISC Place 1 sensor on the skin every 14 days and use to check glucose continuously. 10/19/21   Doran Stabler, DO  glucose blood (CONTOUR NEXT TEST) test strip Test blood glucose 4 times daily 01/01/19   Burna Cash, MD  hydrALAZINE (APRESOLINE) 100 MG tablet Take 1 tablet (100 mg total) by mouth 3 (three) times daily. 09/24/22   Atway, Rayann N, DO  insulin aspart (NOVOLOG FLEXPEN) 100 UNIT/ML FlexPen Inject 8 Units into the skin 3 (three) times daily with meals. 11/19/22   Lyndle Herrlich, MD  insulin glargine (LANTUS SOLOSTAR) 100 UNIT/ML Solostar Pen Inject 30 Units into the skin 2 (two) times daily. 09/15/22   Atway, Rayann N, DO  Insulin Pen Needle 32G X 4 MM MISC USE AS DIRECTED TWICE DAILY. 06/06/20 06/06/21  Theotis Barrio, MD  Lancets MISC Test blood glucose 4 times daily 01/01/19   Burna Cash, MD  Lasmiditan Succinate (REYVOW) 50 MG TABS Take 1 tablet by mouth as needed (for migraine). 08/04/22   Ocie Doyne, MD  meclizine (ANTIVERT) 25 MG tablet Take 1 tablet (25 mg total) by mouth 2 (two) times daily as needed for vertigo/dizziness 03/04/22     multivitamin (RENA-VIT) TABS tablet Take 1 tablet by mouth daily. 08/04/18   Santos-Sanchez, Chelsea Primus, MD  Omega-3 Fatty Acids (FISH OIL) 1000 MG CAPS Take 1,000 mg by mouth in the morning and at bedtime.    [provider]  senna-docusate (SENOKOT S) 8.6-50 MG tablet Take 1 tablet by mouth daily. 05/28/19   Theotis Barrio, MD  topiramate (TOPAMAX) 25 MG tablet Take 1 tablet (25 mg) in  the morning and 2 tablets (50 mg) at bedtime for one week. Then increase to 2 pills (50 mg) twice a day. Take 2 tablets after dialysis on TTS. 08/04/22   Ocie Doyne, MD    Family History Family History  Problem Relation Age of Onset   Hypertension Mother    Hyperlipidemia Mother    Diabetes Mellitus II Sister     Social History Social History   Tobacco Use   Smoking status: Former    Current packs/day: 0.00    Types: Cigarettes    Quit date: 10/22/2003    Years since quitting: 19.4   Smokeless tobacco: Never  Vaping Use   Vaping status: Never Used  Substance Use Topics   Alcohol use: No    Alcohol/week: 0.0 standard drinks of alcohol   Drug use: No     Allergies   Poractant alfa, Pork-derived products, Penicillins, and Poractant alfa  Review of Systems Review of Systems  Constitutional:  Positive for fever. Negative for chills.  HENT:  Positive for congestion and sore throat. Negative for ear pain.   Eyes:  Negative for discharge and redness.  Respiratory:  Positive for cough. Negative for shortness of breath.   Gastrointestinal:  Negative for nausea and vomiting.  Musculoskeletal:  Positive for myalgias.     Physical Exam Triage Vital Signs ED Triage Vitals  Encounter Vitals Group     BP      Systolic BP Percentile      Diastolic BP Percentile      Pulse      Resp      Temp      Temp src      SpO2      Weight      Height      Head Circumference      Peak Flow      Pain Score      Pain Loc      Pain Education      Exclude from Growth Chart    No data found.  Updated Vital Signs BP 121/62 (BP Location: Right Arm)   Pulse 62   Temp 98.3 F (36.8 C) (Oral)   Resp 18   Ht 5\' 4"  (1.626 m)   Wt 213 lb 6.5 oz (96.8 kg)   SpO2 96%   BMI 36.63 kg/m      Physical Exam Vitals and nursing note reviewed.  Constitutional:      General: He is not in acute distress.    Appearance: He is well-developed. He is not ill-appearing.  HENT:     Head:  Normocephalic and atraumatic.     Nose: Congestion present.     Mouth/Throat:     Mouth: Mucous membranes are moist.     Pharynx: Posterior oropharyngeal erythema present. No oropharyngeal exudate.     Tonsils: 0 on the right. 0 on the left.  Eyes:     Conjunctiva/sclera: Conjunctivae normal.  Cardiovascular:     Rate and Rhythm: Normal rate and regular rhythm.     Heart sounds: Normal heart sounds. No murmur heard. Pulmonary:     Effort: Pulmonary effort is normal. No respiratory distress.     Breath sounds: Normal breath sounds. No wheezing, rhonchi or rales.  Skin:    General: Skin is warm and dry.  Neurological:     Mental Status: He is alert.  Psychiatric:        Mood and Affect: Mood normal.        Behavior: Behavior normal.      UC Treatments / Results  Labs (all labs ordered are listed, but only abnormal results are displayed) Labs Reviewed  SARS CORONAVIRUS 2 (TAT 6-24 HRS)    EKG   Radiology No results found.  Procedures Procedures (including critical care time)  Medications Ordered in UC Medications - No data to display  Initial Impression / Assessment and Plan / UC Course  I have reviewed the triage vital signs and the nursing notes.  Pertinent labs & imaging results that were available during my care of the patient were reviewed by me and considered in my medical decision making (see chart for details).    Suspect viral etiology of symptoms.  Will order COVID screening.  Will await results further recommendation.  Advised symptomatic treatment otherwise, increase fluids and rest.  Encouraged patient to discuss with dialysis center regarding what over-the-counter medications are appropriate.  Recommended evaluation with any worsening symptoms in the emergency room.  Final Clinical Impressions(s) / UC Diagnoses   Final diagnoses:  Acute upper respiratory infection   Discharge Instructions   None    ED Prescriptions   None    PDMP not  reviewed this encounter.   Tomi Bamberger, PA-C 03/20/23 1202

## 2023-03-20 NOTE — ED Triage Notes (Signed)
Due to language barrier, an interpreter was present during the history-taking and subsequent discussion (and for part of the physical exam) with this patient. Eric Glenn Number [id number].  "I have a Fever that started Thursday evening". Unknown degree. "My bones ache too". "Some congestion and sore throat at times with occasional cough". No COVID19 testing at home.

## 2023-03-22 ENCOUNTER — Encounter (HOSPITAL_COMMUNITY): Payer: Self-pay

## 2023-03-22 ENCOUNTER — Emergency Department (HOSPITAL_COMMUNITY)
Admission: EM | Admit: 2023-03-22 | Discharge: 2023-03-22 | Disposition: A | Discharge status: Home / Self Care | Payer: Self-pay | Attending: Emergency Medicine | Admitting: Emergency Medicine

## 2023-03-22 ENCOUNTER — Other Ambulatory Visit: Payer: Self-pay

## 2023-03-22 ENCOUNTER — Emergency Department (HOSPITAL_COMMUNITY): Payer: Self-pay

## 2023-03-22 DIAGNOSIS — E1122 Type 2 diabetes mellitus with diabetic chronic kidney disease: Secondary | ICD-10-CM | POA: Insufficient documentation

## 2023-03-22 DIAGNOSIS — Z79899 Other long term (current) drug therapy: Secondary | ICD-10-CM | POA: Insufficient documentation

## 2023-03-22 DIAGNOSIS — I120 Hypertensive chronic kidney disease with stage 5 chronic kidney disease or end stage renal disease: Secondary | ICD-10-CM | POA: Insufficient documentation

## 2023-03-22 DIAGNOSIS — N186 End stage renal disease: Secondary | ICD-10-CM | POA: Insufficient documentation

## 2023-03-22 DIAGNOSIS — Z992 Dependence on renal dialysis: Secondary | ICD-10-CM | POA: Insufficient documentation

## 2023-03-22 DIAGNOSIS — Z794 Long term (current) use of insulin: Secondary | ICD-10-CM | POA: Insufficient documentation

## 2023-03-22 LAB — COMPREHENSIVE METABOLIC PANEL
ALT: 21 U/L (ref 0–44)
AST: 12 U/L — ABNORMAL LOW (ref 15–41)
Albumin: 4 g/dL (ref 3.5–5.0)
Alkaline Phosphatase: 68 U/L (ref 38–126)
Anion gap: 18 — ABNORMAL HIGH (ref 5–15)
BUN: 82 mg/dL — ABNORMAL HIGH (ref 6–20)
CO2: 19 mmol/L — ABNORMAL LOW (ref 22–32)
Calcium: 9.7 mg/dL (ref 8.9–10.3)
Chloride: 100 mmol/L (ref 98–111)
Creatinine, Ser: 13.05 mg/dL — ABNORMAL HIGH (ref 0.61–1.24)
GFR, Estimated: 4 mL/min — ABNORMAL LOW (ref >60)
Glucose, Bld: 106 mg/dL — ABNORMAL HIGH (ref 70–99)
Potassium: 5.3 mmol/L — ABNORMAL HIGH (ref 3.5–5.1)
Sodium: 137 mmol/L (ref 135–145)
Total Bilirubin: 0.8 mg/dL (ref 0.3–1.2)
Total Protein: 7.7 g/dL (ref 6.5–8.1)

## 2023-03-22 LAB — CBC
HCT: 27.8 % — ABNORMAL LOW (ref 39.0–52.0)
Hemoglobin: 9.3 g/dL — ABNORMAL LOW (ref 13.0–17.0)
MCH: 30.1 pg (ref 26.0–34.0)
MCHC: 33.5 g/dL (ref 30.0–36.0)
MCV: 90 fL (ref 80.0–100.0)
Platelets: 116 10*3/uL — ABNORMAL LOW (ref 150–400)
RBC: 3.09 MIL/uL — ABNORMAL LOW (ref 4.22–5.81)
RDW: 12.7 % (ref 11.5–15.5)
WBC: 6.4 10*3/uL (ref 4.0–10.5)
nRBC: 0 % (ref 0.0–0.2)

## 2023-03-22 LAB — SARS CORONAVIRUS 2 (TAT 6-24 HRS): SARS Coronavirus 2: POSITIVE — AB

## 2023-03-22 MED ORDER — SODIUM ZIRCONIUM CYCLOSILICATE 10 G PO PACK
10.0000 g | PACK | Freq: Once | ORAL | Status: AC
  Administered 2023-03-22: 10 g via ORAL
  Filled 2023-03-22: qty 1

## 2023-03-22 NOTE — Discharge Instructions (Addendum)
We evaluated you for needing dialysis.  We obtained laboratory testing, x-rays and other testing to see whether you need dialysis today.  We discussed your test with the kidney doctors who feel that it is safe to go home today.  If you have any new or worsening symptoms, such as difficulty breathing, lightheadedness or dizziness, fainting, leg swelling, fevers or chills, increasing weakness, or any other new symptoms, please come back so we can reevaluate you.  Evaluamos su necesidad de dilisis. Le hicimos anlisis de laboratorio, radiografas y [place] pruebas para ver si necesita dilisis hoy. Analizamos su prueba con los mdicos especialistas en riones, quienes consideran que es seguro que se vaya a casa hoy. Si tiene sntomas nuevos o que [place], como dificultad para respirar, [employer] o vrtigo, [address], hinchazn de piernas, fiebre o escalofros, debilidad creciente o cualquier otro sntoma nuevo, regrese para que podamos volver a evaluarlo.

## 2023-03-22 NOTE — ED Triage Notes (Signed)
Pt tested positive for Covid this past Sunday; last dialysis treatment last Thursday; c/o sob, cough, nausea; denies pain; was told by dialysis center to come to ED for treatment due to being covid positive

## 2023-03-22 NOTE — ED Provider Notes (Signed)
[place] EMERGENCY DEPARTMENT AT [facility] Provider Note  CSN: [id number] Arrival date & time: 03/22/23 1333  Chief Complaint(s) Missed dialysis   HPI Eric Glenn is a 57 y.o. male history of diabetes, hyperlipidemia, hypertension, on TTS dialysis presenting to the emergency department for possible dialysis.  Patient reports that he was diagnosed with COVID last Sunday, was unable to get treatment on Saturday prior to this as he was weak.  Not able to get treatment today because he was COVID-positive and kept taking off his mask during treatment due to his cough.  Reports mild shortness of breath, cough, congestion, generalized weakness.  No syncope.  No leg swelling.  No abdominal pain.  Symptoms mild.  symptoms have been improving  Patient's daughter provides interpretation.  Patient refused formal Spanish interpreter. Past Medical History Past Medical History:  Diagnosis Date   Acute infective otitis externa, right    Anemia    Diabetes (HCC)    Diabetes mellitus type 2, controlled (HCC) 08/04/2015   Diabetic retinopathy (HCC)    Diabetic retinopathy (HCC)    Dialysis patient (HCC)    Diarrhea    ESRD (end stage renal disease) (HCC) 03/10/2016   High cholesterol    Hypertension    Nausea 08/2017   Ulcer of right foot, limited to breakdown of skin (HCC) 11/28/2019   Patient Active Problem List   Diagnosis Date Noted   Abdominal pain 02/08/2023   Dysuria 02/08/2023   Dyspnea 11/19/2022   Depression 11/19/2022   Chronic migraine without aura, intractable, without status migrainosus 05/21/2022   Dizziness 05/21/2022   Ulcer of left foot (HCC) 10/19/2021   Subungual hematoma of great toe of right foot 05/22/2021   Healthcare maintenance 01/28/2021   Onychomycosis 12/14/2020   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 09/27/2019   ESRD (end stage renal disease) (HCC) 03/10/2016   Asthma 10/01/2015   Obesity 10/01/2015    Hyperlipidemia 08/19/2015   Essential hypertension 08/04/2015   Type 2 diabetes mellitus with other specified complication (HCC) 08/04/2015   Diabetic retinopathy associated with type 2 diabetes mellitus (HCC) 08/04/2015   Home Medication(s) Prior to Admission medications   Medication Sig Start Date End Date Taking? Authorizing Provider  acetaminophen (TYLENOL) 325 MG tablet Take 325-650 mg by mouth every 6 (six) hours as needed for mild pain or headache. Last taken yesterday 325mg .    [provider]  albuterol (VENTOLIN HFA) 108 (90 Base) MCG/ACT inhaler Inhale 1-2 puffs into the lungs every 6 (six) hours as needed for wheezing or shortness of breath. 03/24/21   Ivette Loyal, NP  amLODipine (NORVASC) 10 MG tablet Take 1 tablet (10 mg total) by mouth daily. 03/17/22   Atway, Rayann N, DO  atorvastatin (LIPITOR) 40 MG tablet Take 1 tablet (40 mg total) by mouth daily. 11/19/22 11/19/23  Lyndle Herrlich, MD  carvedilol (COREG) 12.5 MG tablet Take 1 tablet (12.5 mg total) by mouth 2 (two) times daily. 08/30/22   Atway, Rayann N, DO  Continuous Blood Gluc Sensor (FREESTYLE LIBRE 3 SENSOR) MISC Place 1 sensor on the skin every 14 days and use to check glucose continuously. 10/19/21   Doran Stabler, DO  glucose blood (CONTOUR NEXT TEST) test strip Test blood glucose 4 times daily 01/01/19   Burna Cash, MD  hydrALAZINE (APRESOLINE) 100 MG tablet Take 1 tablet (100 mg total) by mouth 3 (three) times daily. 09/24/22   Atway, Rayann N, DO  insulin aspart (NOVOLOG  FLEXPEN) 100 UNIT/ML FlexPen Inject 8 Units into the skin 3 (three) times daily with meals. 11/19/22   Lyndle Herrlich, MD  insulin glargine (LANTUS SOLOSTAR) 100 UNIT/ML Solostar Pen Inject 30 Units into the skin 2 (two) times daily. 09/15/22   Atway, Rayann N, DO  Insulin Pen Needle 32G X 4 MM MISC USE AS DIRECTED TWICE DAILY. 06/06/20 06/06/21  Theotis Barrio, MD  Lancets MISC Test blood glucose 4 times daily 01/01/19    Burna Cash, MD  Lasmiditan Succinate (REYVOW) 50 MG TABS Take 1 tablet by mouth as needed (for migraine). 08/04/22   Ocie Doyne, MD  meclizine (ANTIVERT) 25 MG tablet Take 1 tablet (25 mg total) by mouth 2 (two) times daily as needed for vertigo/dizziness 03/04/22     multivitamin (RENA-VIT) TABS tablet Take 1 tablet by mouth daily. 08/04/18   Santos-Sanchez, Chelsea Primus, MD  Omega-3 Fatty Acids (FISH OIL) 1000 MG CAPS Take 1,000 mg by mouth in the morning and at bedtime.    [provider]  senna-docusate (SENOKOT S) 8.6-50 MG tablet Take 1 tablet by mouth daily. 05/28/19   Theotis Barrio, MD  topiramate (TOPAMAX) 25 MG tablet Take 1 tablet (25 mg) in the morning and 2 tablets (50 mg) at bedtime for one week. Then increase to 2 pills (50 mg) twice a day. Take 2 tablets after dialysis on TTS. 08/04/22   Ocie Doyne, MD                                                                                                                                    Past Surgical History Past Surgical History:  Procedure Laterality Date   A/V FISTULAGRAM Right 08/12/2021   Procedure: A/V FISTULAGRAM;  Surgeon: Victorino Sparrow, MD;  Location: [facility] INVASIVE CV LAB;  Service: Cardiovascular;  Laterality: Right;   AV FISTULA PLACEMENT Right 09/04/2015   Procedure: ARTERIOVENOUS (AV) FISTULA CREATION;  Surgeon: Chuck Hint, MD;  Location: [facility] OR;  Service: Vascular;  Laterality: Right;   AV FISTULA PLACEMENT Right 01/22/2016   Procedure: RIGHT UPPER ARM BRACHIOCEPHALIC ARTERIOVENOUS (AV) FISTULA CREATION;  Surgeon: Chuck Hint, MD;  Location: [facility] OR;  Service: Vascular;  Laterality: Right;   IR AV DIALY SHUNT INTRO NEEDLE/INTRACATH INITIAL W/PTA/IMG RIGHT Right 03/01/2022   IR DIALY SHUNT INTRO NEEDLE/INTRACATH INITIAL W/IMG RIGHT Right 12/08/2018   IR DIALY SHUNT INTRO NEEDLE/INTRACATH INITIAL W/IMG RIGHT Right 08/18/2020   IR US GUIDE VASC ACCESS RIGHT  03/01/2022   LIGATION OF  ARTERIOVENOUS  FISTULA Right 01/22/2016   Procedure: LIGATION OF RIGHT FOREARM ARTERIOVENOUS  FISTULA;  Surgeon: Chuck Hint, MD;  Location: [facility] OR;  Service: Vascular;  Laterality: Right;   PERIPHERAL VASCULAR BALLOON ANGIOPLASTY Right 08/12/2021   Procedure: PERIPHERAL VASCULAR BALLOON ANGIOPLASTY;  Surgeon: Victorino Sparrow, MD;  Location: [facility] INVASIVE CV LAB;  Service: Cardiovascular;  Laterality: Right;  upper arm PTA   PERIPHERAL  VASCULAR CATHETERIZATION N/A 01/12/2016   Procedure: Fistulagram;  Surgeon: Chuck Hint, MD;  Location: [facility] INVASIVE CV LAB;  Service: Cardiovascular;  Laterality: N/A;   UMBILICAL HERNIA REPAIR     Family History Family History  Problem Relation Age of Onset   Hypertension Mother    Hyperlipidemia Mother    Diabetes Mellitus II Sister     Social History Social History   Tobacco Use   Smoking status: Former    Current packs/day: 0.00    Types: Cigarettes    Quit date: 10/22/2003    Years since quitting: 19.4   Smokeless tobacco: Never  Vaping Use   Vaping status: Never Used  Substance Use Topics   Alcohol use: No    Alcohol/week: 0.0 standard drinks of alcohol   Drug use: No   Allergies Poractant alfa, Pork-derived products, Penicillins, and Poractant alfa  Review of Systems Review of Systems  All other systems reviewed and are negative.   Physical Exam Vital Signs  I have reviewed the triage vital signs BP (!) 149/72 (BP Location: Left Arm)   Pulse 65   Temp 98 F (36.7 C) (Oral)   Resp 13   SpO2 98%  Physical Exam Vitals and nursing note reviewed.  Constitutional:      General: He is not in acute distress.    Appearance: Normal appearance.  HENT:     Mouth/Throat:     Mouth: Mucous membranes are moist.  Eyes:     Conjunctiva/sclera: Conjunctivae normal.  Cardiovascular:     Rate and Rhythm: Normal rate and regular rhythm.  Pulmonary:     Effort: Pulmonary effort is normal. No respiratory distress.     Breath  sounds: Normal breath sounds.  Abdominal:     General: Abdomen is flat.     Palpations: Abdomen is soft.     Tenderness: There is no abdominal tenderness.  Musculoskeletal:     Comments: Trace bilateral edema lower extremities  Skin:    General: Skin is warm and dry.     Capillary Refill: Capillary refill takes less than 2 seconds.  Neurological:     Mental Status: He is alert and oriented to person, place, and time. Mental status is at baseline.  Psychiatric:        Mood and Affect: Mood normal.        Behavior: Behavior normal.     ED Results and Treatments Labs (all labs ordered are listed, but only abnormal results are displayed) Labs Reviewed  CBC - Abnormal; Notable for the following components:      Result Value   RBC 3.09 (*)    Hemoglobin 9.3 (*)    HCT 27.8 (*)    Platelets 116 (*)    All other components within normal limits  COMPREHENSIVE METABOLIC PANEL - Abnormal; Notable for the following components:   Potassium 5.3 (*)    CO2 19 (*)    Glucose, Bld 106 (*)    BUN 82 (*)    Creatinine, Ser 13.05 (*)    AST 12 (*)    GFR, Estimated 4 (*)    Anion gap 18 (*)    All other components within normal limits  Radiology DG Chest Port 1 View  Result Date: 03/22/2023 CLINICAL DATA:  Cough and shortness of breath EXAM: PORTABLE CHEST 1 VIEW COMPARISON:  Radiograph and CT 11/05/2022 FINDINGS: Stable cardiomegaly. Aortic atherosclerotic calcification. Pulmonary vascular congestion without focal consolidation. No pleural effusion or pneumothorax. IMPRESSION: Cardiomegaly and pulmonary vascular congestion. Electronically Signed   By: Minerva Fester M.D.   On: 03/22/2023 19:12    Pertinent labs & imaging results that were available during my care of the patient were reviewed by me and considered in my medical decision making (see MDM for  details).  Medications Ordered in ED Medications  sodium zirconium cyclosilicate (LOKELMA) packet 10 g (10 g Oral Given 03/22/23 1917)                                                                                                                                     Procedures Procedures  (including critical care time)  Medical Decision Making / ED Course   MDM:  57 year old presenting to the emergency department for possible dialysis.  Patient overall well-appearing, physical exam with only trace edema, no JVD, lungs clear.  Patient does not really appear volume overloaded.  He is not hypoxic on room air.  He is mildly hypertensive.  Labs overall reassuring, very mild hyperkalemia other signs of end-stage renal disease such as elevated BUN and creatinine but patient is not confused.  Mild anion gap elevation likely due to uremia.  Patient has missed a couple dialysis sessions, lower concern that he needs any kind of emergent dialysis today but will discuss with nephrology, if they do not have any availability for nonurgent patients patient should be stable to wait or return if he has worsening symptoms.  Clinical Course as of 03/22/23 1920  Tue Mar 22, 2023  1752 Discussed labs with Dr. Glenna Fellows, nephrology fellow's patient's clinical presentation.  She agrees that patient does not need emergency dialysis right now and is stable for discharge.  Will give him a dose of Lokelma.  Discussed with the family and the patient and they are agreeable to this and understand reasons to return. [WS]    Clinical Course User Index [WS] Suezanne Jacquet, Jerilee Field, MD     Additional history obtained: -Additional history obtained from family -External records from outside source obtained and reviewed including: Chart review including previous notes, labs, imaging, consultation notes including prior er notes   Lab Tests: -I ordered, reviewed, and interpreted labs.   The pertinent results include:   Labs  Reviewed  CBC - Abnormal; Notable for the following components:      Result Value   RBC 3.09 (*)    Hemoglobin 9.3 (*)    HCT 27.8 (*)    Platelets 116 (*)    All other components within normal limits  COMPREHENSIVE METABOLIC PANEL - Abnormal; Notable for the following components:   Potassium 5.3 (*)    CO2 19 (*)  Glucose, Bld 106 (*)    BUN 82 (*)    Creatinine, Ser 13.05 (*)    AST 12 (*)    GFR, Estimated 4 (*)    Anion gap 18 (*)    All other components within normal limits    Notable for mild hyperkalemia, ESRD findings      Imaging Studies ordered: I ordered imaging studies including CXR On my interpretation imaging demonstrates no acute process I independently visualized and interpreted imaging. I agree with the radiologist interpretation   Medicines ordered and prescription drug management: Meds ordered this encounter  Medications   sodium zirconium cyclosilicate (LOKELMA) packet 10 g    -I have reviewed the patients home medicines and have made adjustments as needed   Consultations Obtained: I requested consultation with the nephrologist,  and discussed lab and imaging findings as well as pertinent plan - they recommend: follow up outpatient , return if new symptoms    Social Determinants of Health:  Diagnosis or treatment significantly limited by social determinants of health: obesity    Co morbidities that complicate the patient evaluation  Past Medical History:  Diagnosis Date   Acute infective otitis externa, right    Anemia    Diabetes (HCC)    Diabetes mellitus type 2, controlled (HCC) 08/04/2015   Diabetic retinopathy (HCC)    Diabetic retinopathy (HCC)    Dialysis patient (HCC)    Diarrhea    ESRD (end stage renal disease) (HCC) 03/10/2016   High cholesterol    Hypertension    Nausea 08/2017   Ulcer of right foot, limited to breakdown of skin (HCC) 11/28/2019      Dispostion: Disposition decision including need for  hospitalization was considered, and patient discharged from emergency department.    Final Clinical Impression(s) / ED Diagnoses Final diagnoses:  ESRD on dialysis [facility])     This chart was dictated using voice recognition software.  Despite best efforts to proofread,  errors can occur which can change the documentation meaning.    Lonell Grandchild, MD 03/22/23 Jerene Bears

## 2023-03-24 ENCOUNTER — Encounter (HOSPITAL_COMMUNITY): Payer: Self-pay | Admitting: Nephrology

## 2023-03-24 ENCOUNTER — Other Ambulatory Visit (HOSPITAL_COMMUNITY): Payer: Self-pay

## 2023-03-24 ENCOUNTER — Other Ambulatory Visit: Payer: Self-pay

## 2023-03-24 ENCOUNTER — Other Ambulatory Visit: Payer: Self-pay | Admitting: Internal Medicine

## 2023-03-24 DIAGNOSIS — I10 Essential (primary) hypertension: Secondary | ICD-10-CM

## 2023-03-24 MED ORDER — AMLODIPINE BESYLATE 10 MG PO TABS
10.0000 mg | ORAL_TABLET | Freq: Every day | ORAL | 3 refills | Status: DC
Start: 2023-03-24 — End: 2024-03-30
  Filled 2023-03-24: qty 90, 90d supply, fill #0
  Filled 2023-07-01 (×2): qty 90, 90d supply, fill #1
  Filled 2023-09-19: qty 30, 30d supply, fill #2
  Filled 2023-10-20: qty 30, 30d supply, fill #3
  Filled 2023-11-25: qty 30, 30d supply, fill #4
  Filled 2023-12-23: qty 30, 30d supply, fill #5
  Filled 2024-01-25: qty 30, 30d supply, fill #6
  Filled 2024-02-23: qty 30, 30d supply, fill #7

## 2023-03-25 ENCOUNTER — Other Ambulatory Visit: Payer: Self-pay

## 2023-03-29 ENCOUNTER — Other Ambulatory Visit (HOSPITAL_COMMUNITY): Payer: Self-pay

## 2023-04-01 ENCOUNTER — Other Ambulatory Visit (HOSPITAL_COMMUNITY): Payer: Self-pay

## 2023-04-01 ENCOUNTER — Other Ambulatory Visit: Payer: Self-pay

## 2023-04-01 ENCOUNTER — Encounter (HOSPITAL_COMMUNITY): Payer: Self-pay | Admitting: Nephrology

## 2023-04-01 DIAGNOSIS — E785 Hyperlipidemia, unspecified: Secondary | ICD-10-CM

## 2023-04-04 ENCOUNTER — Other Ambulatory Visit (HOSPITAL_COMMUNITY): Payer: Self-pay

## 2023-04-04 ENCOUNTER — Other Ambulatory Visit: Payer: Self-pay | Admitting: Internal Medicine

## 2023-04-04 ENCOUNTER — Telehealth: Payer: Self-pay

## 2023-04-04 ENCOUNTER — Encounter (HOSPITAL_COMMUNITY): Payer: Self-pay | Admitting: Nephrology

## 2023-04-04 DIAGNOSIS — E119 Type 2 diabetes mellitus without complications: Secondary | ICD-10-CM

## 2023-04-04 MED ORDER — INSULIN DEGLUDEC 100 UNIT/ML ~~LOC~~ SOPN: 25.0000 [IU] | PEN_INJECTOR | Freq: Two times a day (BID) | SUBCUTANEOUS | 5 refills | Status: DC

## 2023-04-04 MED ORDER — ATORVASTATIN CALCIUM 40 MG PO TABS
40.0000 mg | ORAL_TABLET | Freq: Every day | ORAL | 3 refills | Status: DC
Start: 2023-04-04 — End: 2023-08-15
  Filled 2023-04-04 – 2023-04-05 (×2): qty 30, 30d supply, fill #0
  Filled 2023-05-05: qty 30, 30d supply, fill #1
  Filled 2023-06-09: qty 30, 30d supply, fill #2
  Filled 2023-07-15: qty 30, 30d supply, fill #3

## 2023-04-04 NOTE — Telephone Encounter (Signed)
Patient switching from IM program to PAP.   Will need to apply for assistance for Lantus and Novolog medications.   Lantus Solostar PAP company [employer] requires patients to have a Doctor, general practice. Pt doesn't have one. Would switching to [place] [employer]) be an option?  Pt should be able to receive both Novolog and Tresiba meds from [employer] if possible.

## 2023-04-05 ENCOUNTER — Encounter (HOSPITAL_COMMUNITY): Payer: Self-pay | Admitting: Nephrology

## 2023-04-05 ENCOUNTER — Other Ambulatory Visit: Payer: Self-pay | Admitting: Internal Medicine

## 2023-04-05 ENCOUNTER — Other Ambulatory Visit: Payer: Self-pay

## 2023-04-05 ENCOUNTER — Other Ambulatory Visit (HOSPITAL_COMMUNITY): Payer: Self-pay

## 2023-04-05 DIAGNOSIS — I10 Essential (primary) hypertension: Secondary | ICD-10-CM

## 2023-04-06 ENCOUNTER — Other Ambulatory Visit (HOSPITAL_COMMUNITY): Payer: Self-pay

## 2023-04-06 ENCOUNTER — Other Ambulatory Visit: Payer: Self-pay

## 2023-04-06 ENCOUNTER — Other Ambulatory Visit: Payer: Self-pay | Admitting: Internal Medicine

## 2023-04-06 ENCOUNTER — Encounter (HOSPITAL_COMMUNITY): Payer: Self-pay | Admitting: Nephrology

## 2023-04-06 DIAGNOSIS — IMO0002 Reserved for concepts with insufficient information to code with codable children: Secondary | ICD-10-CM

## 2023-04-06 DIAGNOSIS — E1169 Type 2 diabetes mellitus with other specified complication: Secondary | ICD-10-CM

## 2023-04-06 MED ORDER — BASAGLAR KWIKPEN 100 UNIT/ML ~~LOC~~ SOPN
25.0000 [IU] | PEN_INJECTOR | Freq: Two times a day (BID) | SUBCUTANEOUS | 0 refills | Status: DC
  Filled 2023-04-06: qty 15, 30d supply, fill #0

## 2023-04-06 MED ORDER — INSULIN LISPRO (1 UNIT DIAL) 100 UNIT/ML (KWIKPEN)
8.0000 [IU] | PEN_INJECTOR | Freq: Three times a day (TID) | SUBCUTANEOUS | 0 refills | Status: DC
  Filled 2023-04-06: qty 6, 25d supply, fill #0

## 2023-04-06 MED ORDER — HYDRALAZINE HCL 100 MG PO TABS
100.0000 mg | ORAL_TABLET | Freq: Three times a day (TID) | ORAL | 11 refills | Status: DC
  Filled 2023-04-06: qty 90, 30d supply, fill #0
  Filled 2023-06-09: qty 90, 30d supply, fill #1
  Filled 2023-07-15: qty 90, 30d supply, fill #2
  Filled 2023-09-19: qty 90, 30d supply, fill #3
  Filled 2023-10-20: qty 90, 30d supply, fill #4
  Filled 2023-11-25: qty 90, 30d supply, fill #5
  Filled 2023-12-23: qty 90, 30d supply, fill #6
  Filled 2024-01-25: qty 90, 30d supply, fill #7
  Filled 2024-02-23: qty 90, 30d supply, fill #8
  Filled 2024-03-30: qty 90, 30d supply, fill #9

## 2023-04-06 NOTE — Telephone Encounter (Signed)
Patients sister called to check on when patient could get medication. He is out of insulin.  Explained that I am working on his patient assistance for Novolog and Tresiba insulins, and once approved, meds will ship to office.   In the meantime, can Basaglar and Humalog be sent to [facility] pharmacy (with a note of "DOH". This is in their free stock meds)? Daughter would like to be contacted once medication is sent.

## 2023-04-06 NOTE — Addendum Note (Signed)
Addended by: Modena Slater on: 04/06/2023 11:41 AM   Modules accepted: Orders

## 2023-04-07 ENCOUNTER — Other Ambulatory Visit: Payer: Self-pay

## 2023-04-07 ENCOUNTER — Encounter (HOSPITAL_COMMUNITY): Payer: Self-pay | Admitting: Nephrology

## 2023-04-07 MED ORDER — UNIFINE PENTIPS 32G X 4 MM MISC
Freq: Two times a day (BID) | 12 refills | Status: AC
  Filled 2023-04-07: qty 100, 50d supply, fill #0

## 2023-04-07 NOTE — Telephone Encounter (Signed)
Submitted application for NOVOLOG FLEXPEN & TRESIBA FLEXPEN to [employer] for patient assistance via online portal.   Phone: [phone]

## 2023-04-07 NOTE — Telephone Encounter (Signed)
Contacted niece to inform her of where his insulin medications stood. She says she believe he picked these up, but if not she took the info on where they are. She is also aware he is being enrolled for PAP.   Gave her my office number to contact me with any further questions.

## 2023-04-12 NOTE — Telephone Encounter (Signed)
Received notification from [employer] regarding approval for NOVOLOG FLEXPEN & TRESIBA FLEXPEN. Patient assistance approved from 04/11/23 to 04/02/24.  [facility]  Pt ID: [id number]  Company phone: [phone]

## 2023-04-18 ENCOUNTER — Other Ambulatory Visit: Payer: Self-pay

## 2023-04-25 ENCOUNTER — Telehealth: Payer: Self-pay | Admitting: Internal Medicine

## 2023-04-25 NOTE — Telephone Encounter (Signed)
Pt's niece listed in the chart is calling to report the patient is a diabetic with an infected right big toe.

## 2023-04-25 NOTE — Telephone Encounter (Signed)
Return call to pt's niece - stated she cutting his toenails and realized the big toe was red and black on the side of the toe. She also noticed pus. Pt told her he had not injured the toe and stated not having any feeling in his toes. Offered first available appt tomorrow am - pt goes to dialysis. Next appt available Thurs @ 1315PM with Dr Rosaura Carpenter- she agreed to this appt.

## 2023-04-27 NOTE — Telephone Encounter (Signed)
Spoke with patients niece regarding novo nordisk shipment.   Informed her his tresiba and novolog and pen needles are ready for pickup here at the office. She says he has an appt tomorrow and they will be able to pick it up then.   2 boxes pen needles 2 boxes novolog 4 boxes tresiba

## 2023-04-28 ENCOUNTER — Encounter (HOSPITAL_COMMUNITY): Payer: Self-pay | Admitting: Nephrology

## 2023-04-28 ENCOUNTER — Encounter: Payer: Self-pay | Admitting: Student

## 2023-04-28 ENCOUNTER — Other Ambulatory Visit (HOSPITAL_COMMUNITY): Payer: Self-pay

## 2023-04-28 ENCOUNTER — Ambulatory Visit: Payer: Self-pay | Admitting: Student

## 2023-04-28 VITALS — BP 123/62 | HR 59 | Temp 98.4°F | Ht 64.0 in | Wt 215.9 lb

## 2023-04-28 DIAGNOSIS — Z794 Long term (current) use of insulin: Secondary | ICD-10-CM

## 2023-04-28 DIAGNOSIS — Z992 Dependence on renal dialysis: Secondary | ICD-10-CM

## 2023-04-28 DIAGNOSIS — I10 Essential (primary) hypertension: Secondary | ICD-10-CM

## 2023-04-28 DIAGNOSIS — E1169 Type 2 diabetes mellitus with other specified complication: Secondary | ICD-10-CM

## 2023-04-28 DIAGNOSIS — N186 End stage renal disease: Secondary | ICD-10-CM

## 2023-04-28 DIAGNOSIS — I120 Hypertensive chronic kidney disease with stage 5 chronic kidney disease or end stage renal disease: Secondary | ICD-10-CM

## 2023-04-28 MED ORDER — INSULIN DEGLUDEC 100 UNIT/ML ~~LOC~~ SOPN: 25.0000 [IU] | PEN_INJECTOR | Freq: Two times a day (BID) | SUBCUTANEOUS | 5 refills | Status: DC

## 2023-04-28 MED ORDER — CARVEDILOL 12.5 MG PO TABS
12.5000 mg | ORAL_TABLET | Freq: Two times a day (BID) | ORAL | 6 refills | Status: DC
Start: 2023-04-28 — End: 2023-12-23
  Filled 2023-04-28: qty 60, 30d supply, fill #0
  Filled 2023-06-09 (×2): qty 60, 30d supply, fill #1
  Filled 2023-07-15: qty 60, 30d supply, fill #2
  Filled 2023-08-15: qty 60, 30d supply, fill #3
  Filled 2023-09-19: qty 60, 30d supply, fill #4
  Filled 2023-10-20: qty 60, 30d supply, fill #5
  Filled 2023-11-25: qty 60, 30d supply, fill #6

## 2023-04-28 MED ORDER — NOVOLOG FLEXPEN 100 UNIT/ML ~~LOC~~ SOPN
8.0000 [IU] | PEN_INJECTOR | Freq: Three times a day (TID) | SUBCUTANEOUS | Status: DC
Start: 2023-04-28 — End: 2024-01-25

## 2023-04-28 MED ORDER — INSULIN DEGLUDEC 100 UNIT/ML ~~LOC~~ SOPN: 25.0000 [IU] | PEN_INJECTOR | Freq: Two times a day (BID) | SUBCUTANEOUS | 5 refills | Status: AC

## 2023-04-28 MED ORDER — NOVOLOG FLEXPEN 100 UNIT/ML ~~LOC~~ SOPN
8.0000 [IU] | PEN_INJECTOR | Freq: Three times a day (TID) | SUBCUTANEOUS | Status: DC
Start: 2023-04-28 — End: 2023-04-28

## 2023-04-28 NOTE — Patient Instructions (Addendum)
Thank you, [name] for allowing [name] to provide your care today.  I have ordered the following tests for you:  Lab Orders         CBC no Diff         BMP8+Anion Gap         Hemoglobin A1c        Referrals ordered today:   Referral Orders         Ambulatory referral to Podiatry        Follow up:  I will call you  for recommendations on follow up and to discuss your lab work.    We look forward to seeing you next time. Please call our clinic at [phone] if you have any questions or concerns. The best time to call is Monday-Friday from 9am-4pm, but there is someone available 24/7. If after hours or the weekend, call the main hospital number and ask for the Internal Medicine Resident On-Call. If you need medication refills, please notify your pharmacy one week in advance and they will send [name] a request.   Thank you for trusting me with your care. Wishing you the best!  Lovie Macadamia MD [facility]

## 2023-04-28 NOTE — Assessment & Plan Note (Signed)
Patient seen in clinic today after fear of toe infection. Per family - they had discovered the toe was dark, and drained some purulent fluid when trimming his toe nail. On exam for me today, the toe is slightly erythematous. No pain, drainage or ulcer appreciated. Likely symptoms d/t ingrown toenail. Strong dorsalis pedis and posterior tibialis pulses. So signs of acute infection.   Some trouble with insulin prescriptions due to change in coverage by the IM program. Given his insulin which we had today in clinic for him via patient assistance program thanks to our clinical pharmacist.   Plan: Referral to podiatry for future foot care needs Insulin given to patient. A1c, will call patient with results to discuss

## 2023-04-28 NOTE — Progress Notes (Signed)
Subjective:  CC: Toe infection  HPI:  [name] is a 57 y.o. person with a past medical history stated below and presents today for evaluation of his great toe. Please see problem based assessment and plan for additional details.  Past Medical History:  Diagnosis Date   Acute infective otitis externa, right    Anemia    Diabetes (HCC)    Diabetes mellitus type 2, controlled (HCC) 08/04/2015   Diabetic retinopathy (HCC)    Diabetic retinopathy (HCC)    Dialysis patient (HCC)    Diarrhea    ESRD (end stage renal disease) (HCC) 03/10/2016   High cholesterol    Hypertension    Nausea 08/2017   Ulcer of right foot, limited to breakdown of skin (HCC) 11/28/2019    Current Outpatient Medications on File Prior to Visit  Medication Sig Dispense Refill   acetaminophen (TYLENOL) 325 MG tablet Take 325-650 mg by mouth every 6 (six) hours as needed for mild pain or headache. Last taken yesterday 325mg .     albuterol (VENTOLIN HFA) 108 (90 Base) MCG/ACT inhaler Inhale 1-2 puffs into the lungs every 6 (six) hours as needed for wheezing or shortness of breath. 18 g 0   amLODipine (NORVASC) 10 MG tablet Take 1 tablet (10 mg total) by mouth daily. 90 tablet 3   atorvastatin (LIPITOR) 40 MG tablet Take 1 tablet (40 mg total) by mouth daily. 30 tablet 3   Continuous Blood Gluc Sensor (FREESTYLE LIBRE 3 SENSOR) MISC Place 1 sensor on the skin every 14 days and use to check glucose continuously. 2 each 10   glucose blood (CONTOUR NEXT TEST) test strip Test blood glucose 4 times daily 100 each 11   hydrALAZINE (APRESOLINE) 100 MG tablet Take 1 tablet (100 mg total) by mouth 3 (three) times daily. 90 tablet 11   Insulin Glargine (BASAGLAR KWIKPEN) 100 UNIT/ML Inject 25 Units into the skin 2 (two) times daily. 15 mL 0   insulin lispro (HUMALOG) 100 UNIT/ML KwikPen Inject 8 Units into the skin 3 (three) times daily. 6 mL 0   Insulin Pen Needle (UNIFINE PENTIPS) 32G X 4 MM MISC USE AS  DIRECTED TWICE DAILY. 100 each 12   Lancets MISC Test blood glucose 4 times daily 100 each 11   Lasmiditan Succinate (REYVOW) 50 MG TABS Take 1 tablet by mouth as needed (for migraine). 12 tablet 5   meclizine (ANTIVERT) 25 MG tablet Take 1 tablet (25 mg total) by mouth 2 (two) times daily as needed for vertigo/dizziness 60 tablet 6   multivitamin (RENA-VIT) TABS tablet Take 1 tablet by mouth daily. 60 tablet 5   Omega-3 Fatty Acids (FISH OIL) 1000 MG CAPS Take 1,000 mg by mouth in the morning and at bedtime.     senna-docusate (SENOKOT S) 8.6-50 MG tablet Take 1 tablet by mouth daily. 30 tablet 0   topiramate (TOPAMAX) 25 MG tablet Take 1 tablet (25 mg) in the morning and 2 tablets (50 mg) at bedtime for one week. Then increase to 2 pills (50 mg) twice a day. Take 2 tablets after dialysis on TTS. 120 tablet 6   Current Facility-Administered Medications on File Prior to Visit  Medication Dose Route Frequency Provider Last Rate Last Admin   regadenoson (LEXISCAN) injection SOLN 0.4 mg  0.4 mg Intravenous Once Pricilla Riffle, MD       technetium tetrofosmin (TC-MYOVIEW) injection 32.5 millicurie  32.5 millicurie Intravenous Once PRN Pricilla Riffle, MD  Review of Systems: Please see assessment and plan for pertinent positives and negatives.  Objective:   Vitals:   04/28/23 1359  BP: 123/62  Pulse: (!) 59  Temp: 98.4 F (36.9 C)  TempSrc: Oral  SpO2: 99%  Weight: 215 lb 14.4 oz (97.9 kg)  Height: 5\' 4"  (1.626 m)    Physical Exam: Constitutional: Well-appearing, in no acute distress Pulmonary/Chest: normal work of breathing on room air, lungs clear to auscultation bilaterally Abdominal: soft, non-tender, non-distended Extremities: trace edema of the lower extremities bilaterally. Some erythema of the right first toe. No purulence or drainage. Palpable pedal pulses. Intact sensation of the right foot. Some sensory loss of the right toes.  MSK: normal bulk and tone Neurological:  alert & oriented x 3, 5/5 strength in bilateral upper and lower extremities, normal gait Skin: warm and dry Psych: Pleasant affect     Assessment & Plan:  Type 2 diabetes mellitus with other specified complication [facility]) Patient seen in clinic today after fear of toe infection. Per family - they had discovered the toe was dark, and drained some purulent fluid when trimming his toe nail. On exam for me today, the toe is slightly erythematous. No pain, drainage or ulcer appreciated. Likely symptoms d/t ingrown toenail. Strong dorsalis pedis and posterior tibialis pulses. So signs of acute infection.   Some trouble with insulin prescriptions due to change in coverage by the IM program. Given his insulin which we had today in clinic for him via patient assistance program thanks to our clinical pharmacist.   Plan: Referral to podiatry for future foot care needs Insulin given to patient. A1c, will call patient with results to discuss   ESRD (end stage renal disease) (HCC) ESRD - on dialysis. Tolerating well. Still making regular urine.  - BMP - CBC   Patient seen with Dr. Lajean Silvius MD [facility] Internal Medicine  PGY-1 Pager: [phone]  Phone: [phone] Date 04/28/2023  Time 6:05 PM

## 2023-04-28 NOTE — Assessment & Plan Note (Addendum)
ESRD - on dialysis. Tolerating well. Still making regular urine.  - BMP - CBC

## 2023-04-29 LAB — SPECIMEN STATUS REPORT

## 2023-04-29 LAB — CBC
Hematocrit: 31.3 % — ABNORMAL LOW (ref 37.5–51.0)
Hemoglobin: 10.5 g/dL — ABNORMAL LOW (ref 13.0–17.7)
MCH: 30.4 pg (ref 26.6–33.0)
MCHC: 33.5 g/dL (ref 31.5–35.7)
MCV: 91 fL (ref 79–97)
Platelets: 117 10*3/uL — ABNORMAL LOW (ref 150–450)
RBC: 3.45 x10E6/uL — ABNORMAL LOW (ref 4.14–5.80)
RDW: 13.4 % (ref 11.6–15.4)
WBC: 6.8 10*3/uL (ref 3.4–10.8)

## 2023-05-06 ENCOUNTER — Encounter (HOSPITAL_COMMUNITY): Payer: Self-pay | Admitting: Nephrology

## 2023-05-06 ENCOUNTER — Other Ambulatory Visit (HOSPITAL_COMMUNITY): Payer: Self-pay

## 2023-05-17 NOTE — Progress Notes (Signed)
 Internal Medicine Clinic Attending  I was physically present during the key portions of the resident provided service and participated in the medical decision making of patient's management care. I reviewed pertinent patient test results.  The assessment, diagnosis, and plan were formulated together and I agree with the documentation in the resident's note.  Inez Catalina, MD

## 2023-06-09 ENCOUNTER — Encounter (HOSPITAL_COMMUNITY): Payer: Self-pay | Admitting: Nephrology

## 2023-06-09 ENCOUNTER — Other Ambulatory Visit (HOSPITAL_COMMUNITY): Payer: Self-pay

## 2023-06-24 NOTE — Addendum Note (Signed)
Addended by: [name] on: 06/24/2023 07:23 AM   Modules accepted: Orders

## 2023-07-01 ENCOUNTER — Other Ambulatory Visit (HOSPITAL_COMMUNITY): Payer: Self-pay

## 2023-07-01 ENCOUNTER — Encounter (HOSPITAL_COMMUNITY): Payer: Self-pay | Admitting: Nephrology

## 2023-07-15 ENCOUNTER — Encounter (HOSPITAL_COMMUNITY): Payer: Self-pay | Admitting: Nephrology

## 2023-07-15 ENCOUNTER — Other Ambulatory Visit (HOSPITAL_COMMUNITY): Payer: Self-pay

## 2023-08-15 ENCOUNTER — Other Ambulatory Visit: Payer: Self-pay | Admitting: Internal Medicine

## 2023-08-15 ENCOUNTER — Encounter (HOSPITAL_COMMUNITY): Payer: Self-pay | Admitting: Nephrology

## 2023-08-15 ENCOUNTER — Other Ambulatory Visit (HOSPITAL_COMMUNITY): Payer: Self-pay

## 2023-08-15 DIAGNOSIS — E785 Hyperlipidemia, unspecified: Secondary | ICD-10-CM

## 2023-08-15 MED ORDER — ATORVASTATIN CALCIUM 40 MG PO TABS
40.0000 mg | ORAL_TABLET | Freq: Every day | ORAL | 0 refills | Status: DC
  Filled 2023-08-15: qty 30, 30d supply, fill #0
  Filled 2023-09-19: qty 30, 30d supply, fill #1
  Filled 2023-10-20: qty 30, 30d supply, fill #2

## 2023-09-14 ENCOUNTER — Telehealth: Payer: Self-pay

## 2023-09-14 NOTE — Telephone Encounter (Signed)
 Left message informing patient his novo nordisk shipment of insulins are ready for pickup here at the office.  4 boxes of [place] 2 boxes of Novolog

## 2023-09-19 ENCOUNTER — Encounter (HOSPITAL_COMMUNITY): Payer: Self-pay | Admitting: Nephrology

## 2023-09-19 ENCOUNTER — Other Ambulatory Visit (HOSPITAL_COMMUNITY): Payer: Self-pay

## 2023-10-19 NOTE — Telephone Encounter (Signed)
 Moved to sample stock.   No room for inventory.

## 2023-10-20 ENCOUNTER — Encounter (HOSPITAL_COMMUNITY): Payer: Self-pay | Admitting: Nephrology

## 2023-10-20 ENCOUNTER — Other Ambulatory Visit (HOSPITAL_COMMUNITY): Payer: Self-pay

## 2023-10-20 NOTE — Telephone Encounter (Signed)
 Family member picked up 2 boxes of Novolog and 4 boxes of [place].

## 2023-10-20 NOTE — Telephone Encounter (Signed)
 Returned call to [facility] regarding patients insulin.   Informed her Eric Glenn's medications were placed into a different stock since they were never picked up, however they are still available. She would like for [name] to contact her for his medication pickups.   If someone is available, please remove 4 boxes of Tresiba and 2 boxes of Novolog from the bottom shelf (overstock sample inventory) and label them for this patient.   Directions are:   Novolog, 8 units three times daily with meals.  Tresiba, 25 units twice daily

## 2023-10-21 ENCOUNTER — Other Ambulatory Visit (HOSPITAL_COMMUNITY): Payer: Self-pay

## 2023-11-25 ENCOUNTER — Encounter (HOSPITAL_COMMUNITY): Payer: Self-pay | Admitting: Nephrology

## 2023-11-25 ENCOUNTER — Other Ambulatory Visit (HOSPITAL_COMMUNITY): Payer: Self-pay

## 2023-11-25 ENCOUNTER — Other Ambulatory Visit: Payer: Self-pay | Admitting: Internal Medicine

## 2023-11-25 DIAGNOSIS — E785 Hyperlipidemia, unspecified: Secondary | ICD-10-CM

## 2023-11-25 MED ORDER — ATORVASTATIN CALCIUM 40 MG PO TABS
40.0000 mg | ORAL_TABLET | Freq: Every day | ORAL | 1 refills | Status: DC
  Filled 2023-11-25: qty 30, 30d supply, fill #0
  Filled 2023-12-23: qty 30, 30d supply, fill #1
  Filled 2024-01-25: qty 30, 30d supply, fill #2
  Filled 2024-02-23: qty 30, 30d supply, fill #3
  Filled 2024-03-30: qty 30, 30d supply, fill #4
  Filled 2024-05-04: qty 30, 30d supply, fill #5

## 2023-11-25 NOTE — Telephone Encounter (Signed)
 Patient last seen 04/28/23, I called the patient to schedule a follow up appointment. Unable to reach the patient I lvm for him to give us  a call back.

## 2023-12-07 ENCOUNTER — Telehealth: Payer: Self-pay

## 2023-12-07 NOTE — Telephone Encounter (Signed)
 Left message informing patient his novo nordisk shipment is ready for pickup. Also included new address in voicemail.  4 boxes of Tresiba  2 boxes of Novolog   Both 4 month supplies labeled in med room fridge.

## 2023-12-23 ENCOUNTER — Other Ambulatory Visit (HOSPITAL_COMMUNITY): Payer: Self-pay

## 2023-12-23 ENCOUNTER — Encounter (HOSPITAL_COMMUNITY): Payer: Self-pay | Admitting: Nephrology

## 2023-12-23 ENCOUNTER — Other Ambulatory Visit: Payer: Self-pay | Admitting: Student

## 2023-12-23 DIAGNOSIS — I10 Essential (primary) hypertension: Secondary | ICD-10-CM

## 2023-12-26 ENCOUNTER — Encounter (HOSPITAL_COMMUNITY): Payer: Self-pay | Admitting: Nephrology

## 2023-12-26 ENCOUNTER — Other Ambulatory Visit (HOSPITAL_COMMUNITY): Payer: Self-pay

## 2023-12-26 MED ORDER — CARVEDILOL 12.5 MG PO TABS
12.5000 mg | ORAL_TABLET | Freq: Two times a day (BID) | ORAL | 6 refills | Status: DC
  Filled 2023-12-26: qty 60, 30d supply, fill #0
  Filled 2024-01-25: qty 60, 30d supply, fill #1
  Filled 2024-02-23: qty 60, 30d supply, fill #2
  Filled 2024-03-30: qty 60, 30d supply, fill #3
  Filled 2024-05-04: qty 60, 30d supply, fill #4
  Filled 2024-06-07: qty 60, 30d supply, fill #5
  Filled 2024-07-07: qty 60, 30d supply, fill #6

## 2023-12-26 NOTE — Telephone Encounter (Signed)
 Medication sent to pharmacy

## 2023-12-27 ENCOUNTER — Other Ambulatory Visit (HOSPITAL_COMMUNITY): Payer: Self-pay

## 2024-01-12 ENCOUNTER — Ambulatory Visit: Payer: Self-pay

## 2024-01-18 NOTE — Telephone Encounter (Signed)
 Left message with Jocelyn regarding patients medications being ready for pickup.

## 2024-01-22 ENCOUNTER — Encounter: Payer: Self-pay | Admitting: *Deleted

## 2024-01-25 ENCOUNTER — Ambulatory Visit: Payer: Self-pay

## 2024-01-25 ENCOUNTER — Other Ambulatory Visit (HOSPITAL_COMMUNITY): Payer: Self-pay

## 2024-01-25 ENCOUNTER — Encounter (HOSPITAL_COMMUNITY): Payer: Self-pay | Admitting: Nephrology

## 2024-01-25 ENCOUNTER — Ambulatory Visit
Admission: RE | Admit: 2024-01-25 | Discharge: 2024-01-25 | Disposition: A | Discharge status: Home / Self Care | Source: Ambulatory Visit | Attending: Internal Medicine | Admitting: Internal Medicine

## 2024-01-25 VITALS — BP 165/79 | HR 64 | Temp 98.3°F | Ht 64.0 in | Wt 214.8 lb

## 2024-01-25 DIAGNOSIS — R051 Acute cough: Secondary | ICD-10-CM

## 2024-01-25 DIAGNOSIS — R059 Cough, unspecified: Secondary | ICD-10-CM

## 2024-01-25 DIAGNOSIS — Z794 Long term (current) use of insulin: Secondary | ICD-10-CM

## 2024-01-25 DIAGNOSIS — E1169 Type 2 diabetes mellitus with other specified complication: Secondary | ICD-10-CM

## 2024-01-25 DIAGNOSIS — E785 Hyperlipidemia, unspecified: Secondary | ICD-10-CM

## 2024-01-25 DIAGNOSIS — I10 Essential (primary) hypertension: Secondary | ICD-10-CM

## 2024-01-25 LAB — GLUCOSE, CAPILLARY: Glucose-Capillary: 144 mg/dL — ABNORMAL HIGH (ref 70–99)

## 2024-01-25 LAB — POCT GLYCOSYLATED HEMOGLOBIN (HGB A1C): Hemoglobin A1C: 7.6 % — AB (ref 4.0–5.6)

## 2024-01-25 MED ORDER — BENZONATATE 100 MG PO CAPS
100.0000 mg | ORAL_CAPSULE | Freq: Three times a day (TID) | ORAL | 1 refills | Status: AC | PRN
Start: 2024-01-25 — End: 2025-01-24
  Filled 2024-01-25: qty 30, 10d supply, fill #0

## 2024-01-25 MED ORDER — ALBUTEROL SULFATE HFA 108 (90 BASE) MCG/ACT IN AERS
1.0000 | INHALATION_SPRAY | Freq: Four times a day (QID) | RESPIRATORY_TRACT | 0 refills | Status: AC | PRN
  Filled 2024-01-25: qty 6.7, 25d supply, fill #0

## 2024-01-25 NOTE — Progress Notes (Signed)
  O2 SATURATION   Patient Saturations on Room Air at Rest = 94%  Patient Saturations on Room Air while Ambulating = 89%

## 2024-01-25 NOTE — Assessment & Plan Note (Signed)
 Patient with ESRD; currently taking amlodipine  10 mg daily, carvedilol  12.5 mg BID, and hydralazine  100 mg TID. Patient follows with nephrology and is due to be dialyzed tomorrow. No changes with current regimen.

## 2024-01-25 NOTE — Progress Notes (Signed)
 Established Patient Office Visit  Subjective   Patient ID: Eric Glenn, male    DOB: 09-02-1965  Age: 58 y.o. MRN: [id number]  Chief Complaint  Patient presents with   URI    Fatigue, shortness of breath, cough     Eric Glenn is a 58 yr old male with history of ESRD on hemodialysis (T/Th/Sat), DMII, blindness, and HTN who presents to clinic for acute visit today for cough and chest congestion.   Patient returns to clinic today with complaints of cough for 3 weeks. He states that he sometimes he feels like he can't breathe, and it takes him a moment to catch his breath. 3 weeks ago he had about 3 days of fever and chills, but since then, he has not had the fever return, but his cough has remained persistent. He is producing phlegm which has been green at times. Denies hemoptysis. He has not tried OTC medications because of his kidney dysfunction and does not want to take anything that will interfere. Denies runny nose, denies sinus pressure in cheeks and forehead, denies sore throat. He denies any known sick contacts, denies recent travel and has not been around livestock.   He reports adherence to all of his medications and per niece, they do not need any refills on any of his meds. He does still produce urine; dialysis days are T/Th/Sat.    ROS: Denies headaches, dizziness, fever, chills, runny nose, sore throat, vision changes, hearing changes, chest pain, nausea, vomiting, abdominal pain. Denies increased urinary frequency, pain with urination, constipation or diarrhea. No recent falls. He does endorse recent cough productive of sputum, difficulty breathing with cough.       Objective:     BP (!) 165/79 (BP Location: Left Arm, Patient Position: Sitting, Cuff Size: Normal)   Pulse 64   Temp 98.3 F (36.8 C) (Oral)   Ht 5' 4 (1.626 m)   Wt 214 lb 12.8 oz (97.4 kg)   SpO2 94%   BMI 36.87 kg/m  BP Readings from Last 3 Encounters:  01/25/24 (!) 165/79   04/28/23 123/62  03/22/23 (!) 149/72   Wt Readings from Last 3 Encounters:  01/25/24 214 lb 12.8 oz (97.4 kg)  04/28/23 215 lb 14.4 oz (97.9 kg)  03/20/23 213 lb 6.5 oz (96.8 kg)       Physical Exam:   Constitutional: well-appearing male sitting in exam chair, in no acute distress. Ambulates without use of assistance device  HEENT: normocephalic atraumatic, mucous membranes moist Eyes: conjunctiva non-erythematous, patient is blind and pupils remain unequal at baseline  Neck: supple, right-sided palpable cervical lymph node  Throat: non-erythematous, no white patches present  Cardiovascular: regular rate and rhythm, right arm fistula present  Pulmonary/Chest: normal work of breathing on room air, lungs clear to auscultation bilaterally, no rhonchi, crackles or wheezing  Abdominal: soft, non-tender, non-distended MSK: normal bulk and tone. Neurological: alert & oriented x 3 Skin: warm and dry Psych: mood calm, behavior normal, thought content normal, judgement normal     Results for orders placed or performed in visit on 01/25/24  Glucose, capillary  Result Value Ref Range   Glucose-Capillary 144 (H) 70 - 99 mg/dL  POC Hbg J8R  Result Value Ref Range   Hemoglobin A1C 7.6 (A) 4.0 - 5.6 %   HbA1c POC (<> result, manual entry)     HbA1c, POC (prediabetic range)     HbA1c, POC (controlled diabetic range)      Last hemoglobin A1c Lab  Results  Component Value Date   HGBA1C 7.6 (A) 01/25/2024      The ASCVD Risk score (Arnett DK, et al., 2019) failed to calculate for the following reasons:   Cannot find a previous HDL lab   Cannot find a previous total cholesterol lab    Assessment & Plan:   Problem List Items Addressed This Visit       Cardiovascular and Mediastinum   Essential hypertension (Chronic)   Patient with ESRD; currently taking amlodipine  10 mg daily, carvedilol  12.5 mg BID, and hydralazine  100 mg TID. Patient follows with nephrology and is due to be  dialyzed tomorrow. No changes with current regimen.         Endocrine   Type 2 diabetes mellitus with other specified complication (HCC) - Primary (Chronic)   Patient's A1C is 7.6 today, about the same as prior from about a year ago, 7.7. He takes insulin  lispro 8 units TID with meals, and insulin  glargine 25 units BID. Given his ESRD, this is a fair A1C range and we will continue with this regimen. Confirmed with patient's niece that they do not need any refills at this time.       Relevant Orders   POC Hbg A1C (Completed)   Glucose, capillary (Completed)     Other   Hyperlipidemia (Chronic)   Patient with history of elevated lipids in the past. He takes Atorvastatin  40 mg daily, and we will continue this current regimen. Will plan for updated lipid panel at his follow up.  - obtain lipid panel at follow up visit       Acute cough   Patient presents with acute cough for 3 weeks. On physical exam, he is moving air well and bilateral lungs are clear to auscultation, please see physical exam for full details. He has a cough that is generally productive of green sputum, though has had some trouble with coughing anything up sometimes. He has not tried OTC medications due to his kidney issues. Likely began as a viral infection, though unclear whether he has developed a superimposed bacterial infection. We will provide him with albuterol  inhaler as needed, tessalon  for the cough, and will recommend Mucinex  OTC. We will obtain a chest X-ray, and consider empiric coverage including atypicals, though his lungs sound very clear on exam.  - f/u CXR - albuterol  inhaler prn - Tessalon  Perles 100 mg TID prn        Other Visit Diagnoses       Cough in adult       Relevant Medications   albuterol  (VENTOLIN  HFA) 108 (90 Base) MCG/ACT inhaler   benzonatate  (TESSALON  PERLES) 100 MG capsule   Other Relevant Orders   DG Chest 2 View       Return in about 6 months (around 07/27/2024) for diabetes.     Patient discussed with Dr. Rosan, who also saw and evaluated the patient.  Doyal Miyamoto, MD [facility]  PGY-1  01/25/24, 6:47 PM

## 2024-01-25 NOTE — Assessment & Plan Note (Signed)
 Patient's A1C is 7.6 today, about the same as prior from about a year ago, 7.7. He takes insulin  lispro 8 units TID with meals, and insulin  glargine 25 units BID. Given his ESRD, this is a fair A1C range and we will continue with this regimen. Confirmed with patient's niece that they do not need any refills at this time.

## 2024-01-25 NOTE — Patient Instructions (Signed)
 Thank you, [name] for allowing us  to provide your care today. Today we discussed the following:  - acute cough: we will provide you a prescription for an albuterol  inhaler, Tessalon  to help with the cough, and recommend taking OTC Mucinex  for your symptoms  - Please let us  know if you develop fever again or have worsening symptoms - We will keep your hypertension and diabetes regimen the same   I have ordered the following labs for you:  Lab Orders         Glucose, capillary         POC Hbg A1C      Tests ordered today:  Chest X-ray  I have ordered the following medication/changed the following medications:   Stop the following medications: Medications Discontinued During This Encounter  Medication Reason   albuterol  (VENTOLIN  HFA) 108 (90 Base) MCG/ACT inhaler Reorder     Start the following medications: Meds ordered this encounter  Medications   albuterol  (VENTOLIN  HFA) 108 (90 Base) MCG/ACT inhaler    Sig: Inhale 1-2 puffs into the lungs every 6 (six) hours as needed for wheezing or shortness of breath.    Dispense:  18 g    Refill:  0   benzonatate  (TESSALON  PERLES) 100 MG capsule    Sig: Take 1 capsule (100 mg total) by mouth 3 (three) times daily as needed for cough.    Dispense:  30 capsule    Refill:  1     Follow up: 4-6 months    Remember: Please let us  know if you develop fever or have worsening symptoms   Should you have any questions or concerns please call the Internal Medicine Clinic at [phone].     Eric Miyamoto, MD [facility]

## 2024-01-25 NOTE — Assessment & Plan Note (Addendum)
 Patient presents with acute cough for 3 weeks. On physical exam, he is moving air well and bilateral lungs are clear to auscultation, please see physical exam for full details. He has a cough that is generally productive of green sputum, though has had some trouble with coughing anything up sometimes. He has not tried OTC medications due to his kidney issues. Likely began as a viral infection, though unclear whether he has developed a superimposed bacterial infection. We will provide him with albuterol  inhaler as needed, tessalon  for the cough, and will recommend Mucinex  OTC. We will obtain a chest X-ray, and consider empiric coverage including atypicals, though his lungs sound very clear on exam.  - f/u CXR - albuterol  inhaler prn - Tessalon  Perles 100 mg TID prn

## 2024-01-25 NOTE — Assessment & Plan Note (Addendum)
 Patient with history of elevated lipids in the past. He takes Atorvastatin  40 mg daily, and we will continue this current regimen. Will plan for updated lipid panel at his follow up.  - obtain lipid panel at follow up visit

## 2024-01-29 ENCOUNTER — Other Ambulatory Visit (HOSPITAL_COMMUNITY): Payer: Self-pay

## 2024-01-29 MED ORDER — FERRIC CITRATE 1 GM 210 MG(FE) PO TABS: 420.0000 mg | ORAL_TABLET | Freq: Three times a day (TID) | ORAL | 3 refills | Status: AC

## 2024-02-03 NOTE — Progress Notes (Signed)
Internal Medicine Clinic Attending  I was physically present during the key portions of the resident provided service and participated in the medical decision making of patient's management care. I reviewed pertinent patient test results.  The assessment, diagnosis, and plan were formulated together and I agree with the documentation in the resident's note.  Gust Rung, DO

## 2024-02-08 NOTE — Telephone Encounter (Signed)
 Spoke with niece regarding pickup. She will be by this afternoon to pickup meds.

## 2024-02-14 ENCOUNTER — Encounter (HOSPITAL_COMMUNITY): Payer: Self-pay | Admitting: Nephrology

## 2024-02-14 ENCOUNTER — Other Ambulatory Visit (HOSPITAL_COMMUNITY): Payer: Self-pay

## 2024-02-14 MED ORDER — AMLODIPINE BESYLATE 5 MG PO TABS
5.0000 mg | ORAL_TABLET | Freq: Every day | ORAL | 3 refills | Status: AC
  Filled 2024-02-14: qty 90, 90d supply, fill #0

## 2024-02-22 ENCOUNTER — Other Ambulatory Visit: Payer: Self-pay

## 2024-02-22 NOTE — Telephone Encounter (Signed)
 Patients niece never picked up insulins. His last pickup was 10/20/23 for a 4 month supply of each medication. He should be due for these now. I seen in the chart she says they have plenty left.  Just wanted you to be aware he was receiving these from novo nordisk and they are still ready in med room fridge. If anything changes just let me know! They should probably be added to his med list as well.   His enrollment ends 04/02/24 :)  Tresiba  - 25 units BID Novolog  - 8 units TID with meals

## 2024-02-23 ENCOUNTER — Encounter (HOSPITAL_COMMUNITY): Payer: Self-pay | Admitting: Nephrology

## 2024-02-23 ENCOUNTER — Other Ambulatory Visit (HOSPITAL_COMMUNITY): Payer: Self-pay

## 2024-02-27 ENCOUNTER — Other Ambulatory Visit: Payer: Self-pay

## 2024-02-28 ENCOUNTER — Other Ambulatory Visit: Payer: Self-pay

## 2024-02-28 NOTE — Telephone Encounter (Signed)
 Patient's niece here to pick up samples of Tresiba  and Novolog  for patient.

## 2024-03-02 ENCOUNTER — Other Ambulatory Visit (HOSPITAL_COMMUNITY): Payer: Self-pay

## 2024-03-02 ENCOUNTER — Telehealth: Payer: Self-pay

## 2024-03-02 NOTE — Progress Notes (Signed)
 Pharmacy Medication Assistance Program Note    04/20/2024  Patient ID: Eric Glenn, male   DOB: 10/07/1965, 58 y.o.   MRN: [id number]     03/02/2024  Outreach Medication One  Manufacturer Medication One Novo Nordisk  Nordisk Drugs Tresiba   Dose of Tresiba  U100  Type of Sport and exercise psychologist  Date Application Submitted to Manufacturer 03/02/2024  Method Application Sent to Manufacturer Online  Patient Assistance Determination Approved  Approval Start Date 04/18/2024  Approval End Date 04/13/2025        04/20/2024  Patient ID: Eric Glenn, male  DOB: 08-Aug-1965, 58 y.o.  MRN:  [id number]     03/02/2024  Outreach Medication Two  Manufacturer Medication Two Novo Nordisk  Nordisk Drugs Novolog   Dose of Novolog  Flexpen  Type of Journalist, newspaper to [employer]  Date Application Submitted to Manufacturer 03/02/2024  Patient Assistance Determination Approved  Approval Start Date 04/18/2024     Renewal approved until 04/13/25

## 2024-03-30 ENCOUNTER — Encounter (HOSPITAL_COMMUNITY): Payer: Self-pay | Admitting: Nephrology

## 2024-03-30 ENCOUNTER — Other Ambulatory Visit: Payer: Self-pay | Admitting: Internal Medicine

## 2024-03-30 ENCOUNTER — Other Ambulatory Visit (HOSPITAL_COMMUNITY): Payer: Self-pay

## 2024-03-30 DIAGNOSIS — I10 Essential (primary) hypertension: Secondary | ICD-10-CM

## 2024-04-02 ENCOUNTER — Other Ambulatory Visit (HOSPITAL_COMMUNITY): Payer: Self-pay

## 2024-04-02 ENCOUNTER — Encounter (HOSPITAL_COMMUNITY): Payer: Self-pay | Admitting: Nephrology

## 2024-04-02 MED ORDER — AMLODIPINE BESYLATE 10 MG PO TABS
10.0000 mg | ORAL_TABLET | Freq: Every day | ORAL | 3 refills | Status: AC
  Filled 2024-04-02: qty 30, 30d supply, fill #0
  Filled 2024-05-04: qty 30, 30d supply, fill #1
  Filled 2024-06-07: qty 30, 30d supply, fill #2
  Filled 2024-07-07: qty 30, 30d supply, fill #3
  Filled 2024-08-08: qty 30, 30d supply, fill #4

## 2024-04-02 NOTE — Telephone Encounter (Signed)
 Medication sent to pharmacy

## 2024-04-06 ENCOUNTER — Other Ambulatory Visit (HOSPITAL_COMMUNITY): Payer: Self-pay

## 2024-04-21 ENCOUNTER — Encounter: Payer: Self-pay | Admitting: Emergency Medicine

## 2024-04-21 ENCOUNTER — Ambulatory Visit: Admission: EM | Admit: 2024-04-21 | Discharge: 2024-04-21 | Disposition: A | Discharge status: Home / Self Care

## 2024-04-21 DIAGNOSIS — T82838A Hemorrhage of vascular prosthetic devices, implants and grafts, initial encounter: Secondary | ICD-10-CM

## 2024-04-21 DIAGNOSIS — T829XXA Unspecified complication of cardiac and vascular prosthetic device, implant and graft, initial encounter: Secondary | ICD-10-CM

## 2024-04-21 NOTE — Discharge Instructions (Addendum)
 Usted fue atendido hoy por sangrado en el sitio de acceso de dilisis despus de su tratamiento. El sangrado se ha detenido y su fstula parece normal. Se le aplic un vendaje compresivo para ayudar a Insurance underwriter, y Print production planner en su lugar por lo menos de 10 a 12 horas. El vendaje debe sentirse ajustado pero no demasiado apretado. Si su brazo se siente adormecido, fro o se pone plido, afloje un poco el vendaje y vuelva a Clinical cytogeneticist de Wellsite geologist que quede firme pero cmodo. En casa, evite levantar objetos pesados o hacer esfuerzos con el brazo que tiene la fstula [employer] se retire el vendaje. Mantenga el sitio limpio y Dealer, y no manipule ni retire el vendaje antes de [place]. Cuando se retire el vendaje, contine observando el rea para Insurance risk surveyor sangrado o signos de infeccin, como enrojecimiento, hinchazn, calor, secrecin o aumento del dolor. Haga un seguimiento con su equipo de dilisis segn lo programado y comunquese con su mdico de cabecera si nota sensibilidad persistente, hinchazn o cambios en el sitio de la fstula. Vaya a la sala de emergencias de inmediato si el sangrado reaparece y no se detiene con presin firme, si pierde la vibracin o el sonido en su fstula, o si presenta mareo, debilidad, fiebre o malestar general.  You were seen today for bleeding from your dialysis access site after your treatment. The bleeding has stopped, and your fistula appears normal. A pressure dressing has been applied to help keep the sites secure, and this should be left in place for at least 10-12 hours. The wrap should feel snug but not too tight. If your arm feels numb, cold, or turns pale, loosen the wrap slightly and reapply it so it is firm but comfortable. At home, avoid heavy lifting or straining with the arm that has the fistula until the dressing is removed. Keep the site clean and dry, and do not pick at or remove the dressing early. When the dressing is removed, continue  to monitor the area for any bleeding or signs of infection such as redness, swelling, warmth, drainage, or increased pain. Follow up with your dialysis team as scheduled, and contact your primary care provider if you notice persistent tenderness, swelling, or changes in the fistula site. Go to the emergency department right away if bleeding starts again and does not stop with firm pressure, if you lose the vibration or sound in your fistula, or if you develop dizziness, weakness, fever, or feel unwell.

## 2024-04-21 NOTE — ED Triage Notes (Signed)
 Pt presents c/o active bleeding from r arm x 1 day. Pt reports after dialysis his AV Fistula in R arm started to bleed and they could not get it to stop. Bleeding has slowed down but has not stopped completely. Pt states he is feeling light headed and nauseous since the bleeding has started.

## 2024-04-21 NOTE — ED Provider Notes (Signed)
 [facility]    CSN: [id number] Arrival date & time: 04/21/24  1328      History   Chief Complaint Chief Complaint  Patient presents with   Active Bleeding    HPI Eric Glenn is a 58 y.o. male.   Discussed the use of AI scribe software for clinical note transcription with the patient, who gave verbal consent to proceed.   The patient is a male with a history of dialysis who presents with concerns about bleeding from his dialysis access site. He had dialysis earlier today, and upon returning home, he and his caregiver noticed blood.  The bleeding occurred from both sides of the access site, described as from here to like this area and this area. This is reportedly the second time this has happened. The caregiver attempted to stop the bleeding but was unsuccessful when the patient moved. The patient is reported to have lost a little bit of blood, with some dripping noted. The bleeding has since stopped, but there is concern about recurrence with movement.  The patient receives dialysis three times a week on Tuesdays, Thursdays, and Saturdays. He expresses a preference for certain types of dressings, finding the current method more comfortable than what is typically used at the dialysis center. The patient and caregiver are anxious about the potential for renewed bleeding, particularly with movement or coughing.  The following sections of the patient's history were reviewed and updated as appropriate: allergies, current medications, past family history, past medical history, past social history, past surgical history, and problem list.        Past Medical History:  Diagnosis Date   Acute infective otitis externa, right    Anemia    Diabetes (HCC)    Diabetes mellitus type 2, controlled (HCC) 08/04/2015   Diabetic retinopathy (HCC)    Diabetic retinopathy (HCC)    Dialysis patient    Diarrhea    ESRD (end stage renal disease) (HCC) 03/10/2016    High cholesterol    Hypertension    Nausea 08/2017   Ulcer of right foot, limited to breakdown of skin (HCC) 11/28/2019    Patient Active Problem List   Diagnosis Date Noted   Abdominal pain 02/08/2023   Dysuria 02/08/2023   Dyspnea 11/19/2022   Depression 11/19/2022   Chronic migraine without aura, intractable, without status migrainosus 05/21/2022   Dizziness 05/21/2022   Ulcer of left foot (HCC) 10/19/2021   Subungual hematoma of great toe of right foot 05/22/2021   Healthcare maintenance 01/28/2021   Onychomycosis 12/14/2020   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 09/27/2019   ESRD (end stage renal disease) (HCC) 03/10/2016   Asthma 10/01/2015   Acute cough 10/01/2015   Obesity 10/01/2015   Hyperlipidemia 08/19/2015   Essential hypertension 08/04/2015   Type 2 diabetes mellitus with other specified complication (HCC) 08/04/2015   Diabetic retinopathy associated with type 2 diabetes mellitus (HCC) 08/04/2015    Past Surgical History:  Procedure Laterality Date   A/V FISTULAGRAM Right 08/12/2021   Procedure: A/V FISTULAGRAM;  Surgeon: Lanis Fonda BRAVO, MD;  Location: [facility] INVASIVE CV LAB;  Service: Cardiovascular;  Laterality: Right;   AV FISTULA PLACEMENT Right 09/04/2015   Procedure: ARTERIOVENOUS (AV) FISTULA CREATION;  Surgeon: Lonni GORMAN Blade, MD;  Location: [facility] OR;  Service: Vascular;  Laterality: Right;   AV FISTULA PLACEMENT Right 01/22/2016   Procedure: RIGHT UPPER ARM BRACHIOCEPHALIC ARTERIOVENOUS (AV) FISTULA CREATION;  Surgeon: Lonni GORMAN Blade, MD;  Location:  [facility] OR;  Service: Vascular;  Laterality: Right;   IR AV DIALY SHUNT INTRO NEEDLE/INTRACATH INITIAL W/PTA/IMG RIGHT Right 03/01/2022   IR DIALY SHUNT INTRO NEEDLE/INTRACATH INITIAL W/IMG RIGHT Right 12/08/2018   IR DIALY SHUNT INTRO NEEDLE/INTRACATH INITIAL W/IMG RIGHT Right 08/18/2020   IR US  GUIDE VASC ACCESS RIGHT  03/01/2022   LIGATION OF ARTERIOVENOUS  FISTULA Right  01/22/2016   Procedure: LIGATION OF RIGHT FOREARM ARTERIOVENOUS  FISTULA;  Surgeon: Lonni GORMAN Blade, MD;  Location: [facility] OR;  Service: Vascular;  Laterality: Right;   PERIPHERAL VASCULAR BALLOON ANGIOPLASTY Right 08/12/2021   Procedure: PERIPHERAL VASCULAR BALLOON ANGIOPLASTY;  Surgeon: Lanis Fonda BRAVO, MD;  Location: [facility] INVASIVE CV LAB;  Service: Cardiovascular;  Laterality: Right;  upper arm PTA   PERIPHERAL VASCULAR CATHETERIZATION N/A 01/12/2016   Procedure: Fistulagram;  Surgeon: Lonni GORMAN Blade, MD;  Location: [facility] INVASIVE CV LAB;  Service: Cardiovascular;  Laterality: N/A;   UMBILICAL HERNIA REPAIR         Home Medications    Prior to Admission medications   Medication Sig Start Date End Date Taking? Authorizing Provider  Doxercalciferol (HECTOROL IV) 3 mcg. 08/04/23 01/29/25 Yes [provider]  Methoxy PEG-Epoetin  Beta (MIRCERA IJ) Mircera 06/23/23 09/27/24 Yes [provider]  acetaminophen  (TYLENOL ) 325 MG tablet Take 325-650 mg by mouth every 6 (six) hours as needed for mild pain or headache. Last taken yesterday 325mg .    [provider]  albuterol  (VENTOLIN  HFA) 108 (90 Base) MCG/ACT inhaler Inhale 1-2 puffs into the lungs every 6 (six) hours as needed for wheezing or shortness of breath. 01/25/24   Nguyen, Diana, MD  amLODipine  (NORVASC ) 10 MG tablet Take 1 tablet (10 mg total) by mouth daily. 04/02/24   Elnora Ip, MD  amLODipine  (NORVASC ) 5 MG tablet Take 1 tablet (5 mg total) by mouth at bedtime. 02/14/24   Delores Ricka Maidens, NP  atorvastatin  (LIPITOR) 40 MG tablet Take 1 tablet (40 mg total) by mouth daily. 11/25/23 11/24/24  Atway, Rayann N, DO  benzonatate  (TESSALON  PERLES) 100 MG capsule Take 1 capsule (100 mg total) by mouth 3 (three) times daily as needed for cough. 01/25/24 01/24/25  Nguyen, Diana, MD  carvedilol  (COREG ) 12.5 MG tablet Take 1 tablet (12.5 mg total) by mouth 2 (two) times daily. 12/26/23   Atway, Rayann N, DO  Continuous  Blood Gluc Sensor (FREESTYLE LIBRE 3 SENSOR) MISC Place 1 sensor on the skin every 14 days and use to check glucose continuously. 10/19/21   Nguyen, Quan, DO  ferric citrate  (AURYXIA ) 1 GM 210 MG(Fe) tablet Take 2 tablets by mouth three times a day with meals. 01/28/24     glucose blood (CONTOUR NEXT TEST) test strip Test blood glucose 4 times daily 01/01/19   Ruthann Basket, MD  hydrALAZINE  (APRESOLINE ) 100 MG tablet Take 1 tablet (100 mg total) by mouth 3 (three) times daily. 04/06/23   Atway, Rayann N, DO  insulin  aspart (NOVOLOG ) 100 UNIT/ML injection Inject 8 Units into the skin 3 (three) times daily before meals.    [provider]  Insulin  Degludec (TRESIBA ) 100 UNIT/ML SOLN Inject 25 Units into the skin 2 (two) times daily.    [provider]  Insulin  Pen Needle (UNIFINE PENTIPS) 32G X 4 MM MISC USE AS DIRECTED TWICE DAILY. 04/07/23 04/06/24  Atway, Rayann N, DO  Lancets MISC Test blood glucose 4 times daily 01/01/19   Santos-Sanchez, Idalys, MD  Lasmiditan  Succinate (REYVOW ) 50 MG TABS Take 1 tablet by mouth as  needed (for migraine). 08/04/22   Rush Nest, MD  meclizine  (ANTIVERT ) 25 MG tablet Take 1 tablet (25 mg total) by mouth 2 (two) times daily as needed for vertigo/dizziness 03/04/22     multivitamin (RENA-VIT) TABS tablet Take 1 tablet by mouth daily. 08/04/18   Santos-Sanchez, Idalys, MD  Omega-3 Fatty Acids (FISH OIL) 1000 MG CAPS Take 1,000 mg by mouth in the morning and at bedtime.    [provider]  senna-docusate (SENOKOT S) 8.6-50 MG tablet Take 1 tablet by mouth daily. 05/28/19   Lee, Joshua K, MD  topiramate  (TOPAMAX ) 25 MG tablet Take 1 tablet (25 mg) in the morning and 2 tablets (50 mg) at bedtime for one week. Then increase to 2 pills (50 mg) twice a day. Take 2 tablets after dialysis on TTS. 08/04/22   Rush Nest, MD    Family History Family History  Problem Relation Age of Onset   Hypertension Mother    Hyperlipidemia Mother     Diabetes Mellitus II Sister     Social History Social History   Tobacco Use   Smoking status: Former    Current packs/day: 0.00    Types: Cigarettes    Quit date: 10/22/2003    Years since quitting: 20.5    Passive exposure: Past   Smokeless tobacco: Never  Vaping Use   Vaping status: Never Used  Substance Use Topics   Alcohol use: No    Alcohol/week: 0.0 standard drinks of alcohol   Drug use: No     Allergies   Poractant alfa, Pork-derived products, Penicillins, and Poractant alfa   Review of Systems Review of Systems   Physical Exam Triage Vital Signs ED Triage Vitals  Encounter Vitals Group     BP 04/21/24 1405 122/68     Girls Systolic BP Percentile --      Girls Diastolic BP Percentile --      Boys Systolic BP Percentile --      Boys Diastolic BP Percentile --      Pulse Rate 04/21/24 1405 (!) 59     Resp 04/21/24 1405 20     Temp 04/21/24 1405 99.1 F (37.3 C)     Temp Source 04/21/24 1405 Oral     SpO2 04/21/24 1405 97 %     Weight 04/21/24 1403 214 lb 11.7 oz (97.4 kg)     Height --      Head Circumference --      Peak Flow --      Pain Score 04/21/24 1403 0     Pain Loc --      Pain Education --      Exclude from Growth Chart --    No data found.  Updated Vital Signs BP 122/68 (BP Location: Left Arm)   Pulse (!) 59   Temp 99.1 F (37.3 C) (Oral)   Resp 20   Wt 214 lb 11.7 oz (97.4 kg)   SpO2 97%   BMI 36.86 kg/m   Visual Acuity Right Eye Distance:   Left Eye Distance:   Bilateral Distance:    Right Eye Near:   Left Eye Near:    Bilateral Near:     Physical Exam Vitals reviewed.  Constitutional:      General: He is awake. He is not in acute distress.    Appearance: Normal appearance. He is well-developed. He is not ill-appearing, toxic-appearing or diaphoretic.  HENT:     Head: Normocephalic.     Right Ear: Hearing  normal.     Left Ear: Hearing normal.     Nose: Nose normal.     Mouth/Throat:     Mouth: Mucous membranes are  moist.  Eyes:     General: Vision grossly intact.     Conjunctiva/sclera: Conjunctivae normal.  Cardiovascular:     Rate and Rhythm: Normal rate and regular rhythm.     Heart sounds: Normal heart sounds.     Arteriovenous access: Right arteriovenous access is present.     Comments: Right upper arm AV fistula reveals no active bleeding, erythema, increased warmth, swelling, or discoloration. Palpation confirms a positive thrill, and auscultation demonstrates a positive bruit. Surrounding skin appears intact without signs of infection or complication. Pulmonary:     Effort: Pulmonary effort is normal.     Breath sounds: Normal breath sounds and air entry.  Musculoskeletal:        General: Normal range of motion.     Cervical back: Normal range of motion and neck supple.  Skin:    General: Skin is warm and dry.  Neurological:     General: No focal deficit present.     Mental Status: He is alert and oriented to person, place, and time.  Psychiatric:        Speech: Speech normal.        Behavior: Behavior is cooperative.      UC Treatments / Results  Labs (all labs ordered are listed, but only abnormal results are displayed) Labs Reviewed - No data to display  EKG   Radiology No results found.  Procedures Procedures (including critical care time)  Medications Ordered in UC Medications - No data to display  Initial Impression / Assessment and Plan / UC Course  I have reviewed the triage vital signs and the nursing notes.  Pertinent labs & imaging results that were available during my care of the patient were reviewed by me and considered in my medical decision making (see chart for details).    The patient experienced bleeding from right dialysis access site following today's dialysis session. At the time of examination, the bleeding had resolved, though the patient and caregiver expressed concern about recurrence with movement. On exam, the AV fistula demonstrated a  positive bruit and thrill. A Coban pressure dressing was applied to the access sites and should remain in place for at least 10-12 hours, with instructions to ensure the dressing is snug but not overly tight. The patient was advised to monitor closely for any recurrent bleeding, swelling, increased pain, or signs of infection. Follow-up with the dialysis team and primary care provider was recommended for ongoing site care and evaluation. Emergency care should be sought immediately if bleeding restarts and cannot be controlled with firm pressure, if there is sudden loss of bruit or thrill, if the arm becomes cold or pale, or if systemic symptoms such as fever, dizziness, or weakness occur.  Today's evaluation has revealed no signs of a dangerous process. Discussed diagnosis with patient and/or guardian. Patient and/or guardian aware of their diagnosis, possible red flag symptoms to watch out for and need for close follow up. Patient and/or guardian understands verbal and written discharge instructions. Patient and/or guardian comfortable with plan and disposition.  Patient and/or guardian has a clear mental status at this time, good insight into illness (after discussion and teaching) and has clear judgment to make decisions regarding their care  Documentation was completed with the aid of voice recognition software. Transcription may contain typographical errors.  Final Clinical Impressions(s) / UC Diagnoses   Final diagnoses:  Complication of arteriovenous dialysis fistula, initial encounter  Hemorrhage from dialysis catheter     Discharge Instructions      Usted fue atendido hoy por sangrado en el sitio de acceso de dilisis despus de su tratamiento. El sangrado se ha detenido y su fstula parece normal. Se le aplic un vendaje compresivo para ayudar a Insurance underwriter, y Print production planner en su lugar por lo menos de 10 a 12 horas. El vendaje debe sentirse ajustado pero no demasiado  apretado. Si su brazo se siente adormecido, fro o se pone plido, afloje un poco el vendaje y vuelva a Clinical cytogeneticist de Wellsite geologist que quede firme pero cmodo. En casa, evite levantar objetos pesados o hacer esfuerzos con el brazo que tiene la fstula [employer] se retire el vendaje. Mantenga el sitio limpio y Dealer, y no manipule ni retire el vendaje antes de [place]. Cuando se retire el vendaje, contine observando el rea para Insurance risk surveyor sangrado o signos de infeccin, como enrojecimiento, hinchazn, calor, secrecin o aumento del dolor. Haga un seguimiento con su equipo de dilisis segn lo programado y comunquese con su mdico de cabecera si nota sensibilidad persistente, hinchazn o cambios en el sitio de la fstula. Vaya a la sala de emergencias de inmediato si el sangrado reaparece y no se detiene con presin firme, si pierde la vibracin o el sonido en su fstula, o si presenta mareo, debilidad, fiebre o malestar general.  You were seen today for bleeding from your dialysis access site after your treatment. The bleeding has stopped, and your fistula appears normal. A pressure dressing has been applied to help keep the sites secure, and this should be left in place for at least 10-12 hours. The wrap should feel snug but not too tight. If your arm feels numb, cold, or turns pale, loosen the wrap slightly and reapply it so it is firm but comfortable. At home, avoid heavy lifting or straining with the arm that has the fistula until the dressing is removed. Keep the site clean and dry, and do not pick at or remove the dressing early. When the dressing is removed, continue to monitor the area for any bleeding or signs of infection such as redness, swelling, warmth, drainage, or increased pain. Follow up with your dialysis team as scheduled, and contact your primary care provider if you notice persistent tenderness, swelling, or changes in the fistula site. Go to the emergency department right away if bleeding  starts again and does not stop with firm pressure, if you lose the vibration or sound in your fistula, or if you develop dizziness, weakness, fever, or feel unwell.     ED Prescriptions   None    PDMP not reviewed this encounter.   Iola Lukes, [place] 04/21/24 1447

## 2024-04-27 ENCOUNTER — Encounter (HOSPITAL_COMMUNITY): Payer: Self-pay | Admitting: Nephrology

## 2024-04-27 ENCOUNTER — Other Ambulatory Visit: Payer: Self-pay

## 2024-04-27 ENCOUNTER — Ambulatory Visit: Payer: Self-pay | Admitting: Student

## 2024-04-27 VITALS — BP 181/64 | HR 64 | Temp 97.8°F | Ht 64.0 in | Wt 209.8 lb

## 2024-04-27 DIAGNOSIS — E1169 Type 2 diabetes mellitus with other specified complication: Secondary | ICD-10-CM

## 2024-04-27 DIAGNOSIS — F322 Major depressive disorder, single episode, severe without psychotic features: Secondary | ICD-10-CM

## 2024-04-27 DIAGNOSIS — Z794 Long term (current) use of insulin: Secondary | ICD-10-CM

## 2024-04-27 DIAGNOSIS — I10 Essential (primary) hypertension: Secondary | ICD-10-CM

## 2024-04-27 DIAGNOSIS — G43719 Chronic migraine without aura, intractable, without status migrainosus: Secondary | ICD-10-CM

## 2024-04-27 DIAGNOSIS — E785 Hyperlipidemia, unspecified: Secondary | ICD-10-CM

## 2024-04-27 DIAGNOSIS — G629 Polyneuropathy, unspecified: Secondary | ICD-10-CM

## 2024-04-27 LAB — POCT GLYCOSYLATED HEMOGLOBIN (HGB A1C): HbA1c, POC (controlled diabetic range): 9.2 % — AB (ref 0.0–7.0)

## 2024-04-27 LAB — GLUCOSE, CAPILLARY: Glucose-Capillary: 285 mg/dL — ABNORMAL HIGH (ref 70–99)

## 2024-04-27 MED ORDER — GABAPENTIN 100 MG PO CAPS
100.0000 mg | ORAL_CAPSULE | ORAL | 1 refills | Status: AC
  Filled 2024-04-27: qty 24, 56d supply, fill #0

## 2024-04-27 MED ORDER — SERTRALINE HCL 50 MG PO TABS
50.0000 mg | ORAL_TABLET | Freq: Every day | ORAL | 2 refills | Status: AC
  Filled 2024-04-27: qty 30, 30d supply, fill #0
  Filled 2024-06-07: qty 30, 30d supply, fill #1

## 2024-04-27 NOTE — Progress Notes (Signed)
 CC: Routine Follow Up for management of chronic medical conditions after last office visit 01/25/2024  HPI:  Eric Glenn is a 58 y.o. male with pertinent PMH of hypertension, type 2 diabetes with retinopathy, ESRD on HD TTS, and migraines who presents as above. Please see assessment and plan below for further details.  Medications: Current Outpatient Medications  Medication Instructions   acetaminophen  (TYLENOL ) 325-650 mg, Oral, Every 6 hours PRN, Last taken yesterday 325mg .    albuterol  (VENTOLIN  HFA) 108 (90 Base) MCG/ACT inhaler 1-2 puffs, Inhalation, Every 6 hours PRN   amLODipine  (NORVASC ) 5 mg, Oral, Daily at bedtime   amLODipine  (NORVASC ) 10 mg, Oral, Daily   atorvastatin  (LIPITOR) 40 mg, Oral, Daily   benzonatate  (TESSALON  PERLES) 100 mg, Oral, 3 times daily PRN   carvedilol  (COREG ) 12.5 mg, Oral, 2 times daily   Continuous Blood Gluc Sensor (FREESTYLE LIBRE 3 SENSOR) MISC Place 1 sensor on the skin every 14 days and use to check glucose continuously.   Doxercalciferol (HECTOROL IV) 3 mcg   ferric citrate  (AURYXIA ) 1 GM 210 MG(Fe) tablet Take 2 tablets by mouth three times a day with meals.   Fish Oil 1,000 mg, Oral, 2 times daily   gabapentin (NEURONTIN) 100 mg, Oral, 3 times weekly, Take after dialysis.   glucose blood (CONTOUR NEXT TEST) test strip Test blood glucose 4 times daily   hydrALAZINE  (APRESOLINE ) 100 mg, Oral, 3 times daily   insulin  aspart (NOVOLOG ) 8 Units, 3 times daily before meals   Insulin  Degludec (TRESIBA ) 28 Units, Subcutaneous, 2 times daily   Insulin  Pen Needle (UNIFINE PENTIPS) 32G X 4 MM MISC USE AS DIRECTED TWICE DAILY.   Lancets MISC Test blood glucose 4 times daily   Lasmiditan  Succinate (REYVOW ) 50 MG TABS Take 1 tablet by mouth as needed (for migraine).   meclizine  (ANTIVERT ) 25 MG tablet Take 1 tablet (25 mg total) by mouth 2 (two) times daily as needed for vertigo/dizziness   Methoxy PEG-Epoetin  Beta (MIRCERA IJ) Mircera    multivitamin (RENA-VIT) TABS tablet 1 tablet, Oral, Daily   senna-docusate (SENOKOT S) 8.6-50 MG tablet 1 tablet, Oral, Daily   sertraline (ZOLOFT) 50 mg, Oral, Daily   topiramate  (TOPAMAX ) 25 MG tablet Take 1 tablet (25 mg) in the morning and 2 tablets (50 mg) at bedtime for one week. Then increase to 2 pills (50 mg) twice a day. Take 2 tablets after dialysis on TTS.     Review of Systems:   Pertinent items noted in HPI and/or A&P.  Physical Exam:  Vitals:   04/27/24 1101 04/27/24 1158  BP: (!) 155/79 (!) 181/64  Pulse: 70 64  Temp: 97.8 F (36.6 C)   TempSrc: Oral   SpO2: 96%   Weight: 209 lb 12.8 oz (95.2 kg)   Height: 5' 4 (1.626 m)     Constitutional: Chronically ill-appearing elderly male. In no acute distress. Cardio:Regular rate and rhythm. 2+ bilateral radial pulses.  Palpable thrill over right upper extremity AV fistula Pulm:Clear to auscultation bilaterally. Normal work of breathing on room air. MSK: Trace pitting edema in the lower extremity bilaterally Skin:Warm and dry. Neuro:Alert and oriented x3.   Assessment & Plan:   Assessment & Plan Type 2 diabetes mellitus with other specified complication, without long-term current use of insulin  (HCC) A1c today 9.2 up from 7.6 on 01/25/2024.  He is currently on insulin  degludec 25 units twice daily and insulin  lispro 8 units 3 times daily with meals.  He is not  very good at checking his sugars and his daughter is not able to be around enough to check them consistently.  He has not had issues with low blood sugars in the past.  His diet is variable and needs improvement.  He does not have insurance but there filling out the CAFA and orange card paperwork.  He would benefit from a CGM but at the moment it is likely cost prohibitive.  I will discuss with our pharmacy team and diabetic coordinator for options such as professional CGM. - Increase insulin  degludec to 28 units twice daily and continue insulin  lispro 8 units 3 times  daily - Stressed the importance of checking morning blood sugars Current severe episode of major depressive disorder without psychotic features, unspecified whether recurrent (HCC) PHQ-9 score today of 18 with an answer of yes to the self-harm question.  On further investigation he admits to a passive death wish but no thoughts of self-harm or any plan to do so.  He attributes most of his depressive symptoms to his chronic illnesses, most notably ESRD on dialysis.  He primarily has thoughts that he wants to give up and is ready for dialysis to end.  We discussed return precautions for any suicidal thoughts.  We also discussed counseling versus medication and he is agreeable to start both of these. - Start sertraline 50 mg daily and referral to integrated behavioral health - Return in 6 weeks with MD/DO for reevaluation, with behavioral health prior to that Neuropathy Patient reports significant paresthesias in his bilateral lower and less so bilateral upper extremities that worsens at night and during dialysis.  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  Discussed starting gabapentin for symptoms which he is agreeable to do. - Start gabapentin 100 mg 3 times weekly after dialysis Essential hypertension Blood pressure elevated at 181/64 on recheck although in the setting of significant psychological stress.  Patient is ESRD and has had shorter dialysis sessions as discussed elsewhere.  I will not make any changes to his blood pressure management today but would recommend that he continues to check his blood pressure at home and nephrology follow-up where they can titrate medications. - Continue amlodipine  10 mg daily, carvedilol  12.5 mg twice daily, and hydralazine  100 mg 3 times daily Hyperlipidemia, unspecified hyperlipidemia type Lipid panel today with continued good control  of LDL at 29. - Continue atorvastatin  40 mg daily Chronic migraine without aura, intractable, without status migrainosus Patient with history of migraines and it seems they may have returned.  He was previously on topiramate  and follow-up with neurology but has not seen them in nearly 2 years so we will follow-up with neurology.  Orders Placed This Encounter  Procedures   Lipid panel   Glucose, capillary   Ambulatory referral to Integrated Behavioral Health    Referral Priority:   Routine    Referral Type:   Consultation    Referral Reason:   Specialty Services Required    Referred to Provider:   Bennett Renda PARAS    Number of Visits Requested:   1   POCT glycosylated hemoglobin (Hb A1C)     Return in about 6 weeks (around 06/08/2024) for Depression.   Patient discussed with Dr. Jone Dauphin  Fairy Pool, DO [facility] Internal [facility] Resident PGY-3 Clinic Phone: [phone] Please contact the on call pager at [phone] for any urgent or emergent needs.

## 2024-04-27 NOTE — Progress Notes (Signed)
 [facility] Medicine Clinic Attending  Case discussed with the resident at the time of the visit.  We reviewed the resident's history and exam and pertinent patient test results.  I agree with the assessment, diagnosis, and plan of care documented in the resident's note.  Jone Dauphin MD

## 2024-04-27 NOTE — Patient Instructions (Addendum)
 Gracias, Eric Glenn, por permitirnos atenderlo hoy. Hoy hablamos...  > Diabetes - Su A1c aument desde su ltima visita, por lo que necesitaremos aumentar su dosis de insulina. Me gustara aumentar la insulina degludec de accin prolongada de 25 [employer] veces al da a 28 [employer] veces al da. Es importante que se controle el azcar por la maana antes de comer. Si presenta algn nivel bajo de azcar en sangre en ese momento, tendremos que reducir la dosis y Programme researcher, broadcasting/film/video a verlo antes para hablar sobre cmo cambiar la insulina de accin corta. Intentaremos instalar un monitor continuo de glucosa, pero podra ser costoso. Hablar con nuestro coordinador de diabetes sobre la posibilidad de Music therapist un monitor que dure [employer], pero tampoco estoy seguro del precio. Si logramos realizarle controles de glucosa constantes y no presenta niveles bajos de azcar con este cambio, planeamos volver a verlo en 3 meses. Sin embargo, si tiene algn problema, lo veremos antes. Migraas - Como ya comentamos, le recomiendo que vuelva a la Psychiatrist de neurologa para hablar [employer] medicamentos que estaba tomando y que no le estaban funcionando.  Hormigueo en las piernas - Creo que los sntomas podran estar relacionados con la brevedad de sus sesiones de dilisis y me gustara que lo comentara con su nefrlogo. Si no puede completar sus sesiones de dilisis, es posible que no mejore. Tambin probaremos un medicamento llamado gabapentina, que tomar despus de la dilisis. Comenzaremos con gabapentina 100 mg 3 veces por semana despus de la dilisis.  Depresin: hoy me gustara empezar con sertralina, un medicamento de accin lenta para la depresin que se toma a diario. Comenzar con 50 mg al da y nos veremos de nuevo en unas 6 semanas para ver cmo est funcionando y si es necesario aumentar la medicacin. Este medicamento puede tener efectos secundarios gastrointestinales como diarrea,  nuseas y causar somnolencia o insomnio, as que por favor, avsenos si estos se presentan y son demasiado molestos para continuar con Research scientist (medical). Tambin he enviado una referencia a nuestra consejera de salud mental para que alguien se ponga en contacto con usted y programe una cita con ella. Si tiene jersey idea de autolesionarse, por favor, pngase en contacto con nuestra oficina y acuda a urgencias de inmediato.  He solicitado los siguientes anlisis:  Ordenes de laboratorio Perfil lipdico Glucosa capilar Hemoglobina glucosilada (HbA1C) en el punto de control  Seguimiento: 6 semanas  Recuerde: Si tiene alguna pregunta o inquietud, llame a la clnica de medicina interna al [phone].  Fairy Pool, DO [facility]  Thank you, Eric Glenn, for allowing us  to provide your care today. Today we discussed . . .  > Diabetes       - Your A1c increased since your last visit so we will need to increase your insulin  dose.  I would like to increase the long-acting insulin  degludec from 25 units twice daily to 28 units twice daily.  It is important that he checks his sugar in the morning before he eats and if there are any low blood sugars at that time we will need to decrease this dose and see him back sooner to discuss how we are going to change the short acting insulin .  We will try to start a continuous glucose monitor but this could be expensive.  I will talk to our diabetes coordinator about us  placing a monitor on him that would last for about 2 weeks but I am not sure  of the price of this either.  If we are able to get consistent glucose checks on him and he is not having low blood sugars on this change we will plan to see him back in 3 months however if you are having any issues we will see him back sooner. > Migraines       - As we discussed I recommend that you go back to the neurology office to discuss the medications that he was taking and  that they were not working. > Tingling in the legs       - I think the symptoms could have something to do with your dialysis sessions being short and would like you to discuss this further with your nephrologist.  If he is unable to do your full dialysis sessions this may not get better.  We will also try a medication called gabapentin which you will take after dialysis.  We will start gabapentin 100 mg 3 times weekly after dialysis.  Depression-today I would like to start a medication called sertraline which is a slow acting medication for depression that you take daily.  You will start at 50 mg daily and we will see you back in about 6 weeks to see how it is working and see if we might need to increase the medication.  This medication can have gastrointestinal side effects such as diarrhea, nausea, and cause some drowsiness or insomnia so please let us  know if these occur and are too bothersome to continue the medicine.  I have also sent a referral to our behavioral health counselor so someone should contact you to schedule a visit with her.  If you have any thoughts of self-harm please contact our office and go to the emergency department right away.   I have ordered the following labs for you:   Lab Orders         Lipid panel         Glucose, capillary         POCT glycosylated hemoglobin (Hb A1C)       Follow up: 6 weeks    Remember:  Should you have any questions or concerns please call the internal medicine clinic at [phone].     Fairy Pool, DO [facility]

## 2024-04-28 LAB — LIPID PANEL
Chol/HDL Ratio: 3.6 ratio (ref 0.0–5.0)
Cholesterol, Total: 82 mg/dL — ABNORMAL LOW (ref 100–199)
HDL: 23 mg/dL — ABNORMAL LOW (ref >39)
LDL Chol Calc (NIH): 29 mg/dL (ref 0–99)
Triglycerides: 183 mg/dL — ABNORMAL HIGH (ref 0–149)
VLDL Cholesterol Cal: 30 mg/dL (ref 5–40)

## 2024-04-28 NOTE — Assessment & Plan Note (Signed)
 PHQ-9 score today of 18 with an answer of yes to the self-harm question.  On further investigation Eric Glenn admits to a passive death wish but no thoughts of self-harm or any plan to do so.  Eric Glenn attributes most of his depressive symptoms to his chronic illnesses, most notably ESRD on dialysis.  Eric Glenn primarily has thoughts that Eric Glenn wants to give up and is ready for dialysis to end.  We discussed return precautions for any suicidal thoughts.  We also discussed counseling versus medication and Eric Glenn is agreeable to start both of these. - Start sertraline 50 mg daily and referral to integrated behavioral health - Return in 6 weeks with MD/DO for reevaluation, with behavioral health prior to that

## 2024-04-28 NOTE — Assessment & Plan Note (Signed)
 Blood pressure elevated at 181/64 on recheck although in the setting of significant psychological stress.  Patient is ESRD and has had shorter dialysis sessions as discussed elsewhere.  I will not make any changes to his blood pressure management today but would recommend that he continues to check his blood pressure at home and nephrology follow-up where they can titrate medications. - Continue amlodipine  10 mg daily, carvedilol  12.5 mg twice daily, and hydralazine  100 mg 3 times daily

## 2024-04-28 NOTE — Assessment & Plan Note (Signed)
 Patient with history of migraines and it seems they may have returned.  He was previously on topiramate  and follow-up with neurology but has not seen them in nearly 2 years so we will follow-up with neurology.

## 2024-04-28 NOTE — Assessment & Plan Note (Signed)
 Lipid panel today with continued good control of LDL at 29. - Continue atorvastatin  40 mg daily

## 2024-04-28 NOTE — Assessment & Plan Note (Signed)
 A1c today 9.2 up from 7.6 on 01/25/2024.  He is currently on insulin  degludec 25 units twice daily and insulin  lispro 8 units 3 times daily with meals.  He is not very good at checking his sugars and his daughter is not able to be around enough to check them consistently.  He has not had issues with low blood sugars in the past.  His diet is variable and needs improvement.  He does not have insurance but there filling out the CAFA and orange card paperwork.  He would benefit from a CGM but at the moment it is likely cost prohibitive.  I will discuss with our pharmacy team and diabetic coordinator for options such as professional CGM. - Increase insulin  degludec to 28 units twice daily and continue insulin  lispro 8 units 3 times daily - Stressed the importance of checking morning blood sugars

## 2024-05-03 ENCOUNTER — Other Ambulatory Visit: Payer: Self-pay

## 2024-05-04 ENCOUNTER — Other Ambulatory Visit: Payer: Self-pay

## 2024-05-04 ENCOUNTER — Other Ambulatory Visit (HOSPITAL_COMMUNITY): Payer: Self-pay

## 2024-05-04 ENCOUNTER — Encounter (HOSPITAL_COMMUNITY): Payer: Self-pay | Admitting: Nephrology

## 2024-05-09 ENCOUNTER — Telehealth: Payer: Self-pay

## 2024-05-09 NOTE — Telephone Encounter (Signed)
 Rec'd 4 boxes Tresiba  U100 (3.5 month supply) Rec'd 2 boxes Novolog  Flexpens (4 month supply)

## 2024-05-16 NOTE — Telephone Encounter (Signed)
 Left message with Jocelyn regarding meds being ready for pickup

## 2024-06-05 ENCOUNTER — Institutional Professional Consult (permissible substitution): Payer: Self-pay | Admitting: Licensed Clinical Social Worker

## 2024-06-07 ENCOUNTER — Other Ambulatory Visit: Payer: Self-pay | Admitting: Internal Medicine

## 2024-06-07 ENCOUNTER — Encounter (HOSPITAL_COMMUNITY): Payer: Self-pay | Admitting: Nephrology

## 2024-06-07 ENCOUNTER — Other Ambulatory Visit (HOSPITAL_COMMUNITY): Payer: Self-pay

## 2024-06-07 DIAGNOSIS — E785 Hyperlipidemia, unspecified: Secondary | ICD-10-CM

## 2024-06-07 MED ORDER — ATORVASTATIN CALCIUM 40 MG PO TABS
40.0000 mg | ORAL_TABLET | Freq: Every day | ORAL | 1 refills | Status: AC
  Filled 2024-06-07: qty 90, 90d supply, fill #0

## 2024-06-07 NOTE — Telephone Encounter (Signed)
 Medication sent to pharmacy

## 2024-06-08 ENCOUNTER — Other Ambulatory Visit (HOSPITAL_COMMUNITY): Payer: Self-pay

## 2024-06-25 ENCOUNTER — Telehealth: Payer: Self-pay

## 2024-06-25 NOTE — Telephone Encounter (Signed)
    Novolog  pens are shipping as well.

## 2024-07-07 ENCOUNTER — Other Ambulatory Visit (HOSPITAL_COMMUNITY): Payer: Self-pay

## 2024-07-07 ENCOUNTER — Encounter (HOSPITAL_COMMUNITY): Payer: Self-pay | Admitting: Nephrology

## 2024-07-07 ENCOUNTER — Other Ambulatory Visit: Payer: Self-pay

## 2024-07-09 ENCOUNTER — Other Ambulatory Visit: Payer: Self-pay

## 2024-07-10 ENCOUNTER — Encounter (HOSPITAL_COMMUNITY): Payer: Self-pay | Admitting: Nephrology

## 2024-07-10 ENCOUNTER — Other Ambulatory Visit (HOSPITAL_COMMUNITY): Payer: Self-pay

## 2024-07-10 ENCOUNTER — Other Ambulatory Visit: Payer: Self-pay | Admitting: Internal Medicine

## 2024-07-10 DIAGNOSIS — I10 Essential (primary) hypertension: Secondary | ICD-10-CM

## 2024-07-10 MED ORDER — HYDRALAZINE HCL 100 MG PO TABS
100.0000 mg | ORAL_TABLET | Freq: Three times a day (TID) | ORAL | 11 refills | Status: AC
  Filled 2024-07-10: qty 90, 30d supply, fill #0
  Filled 2024-08-08: qty 90, 30d supply, fill #1

## 2024-07-10 NOTE — Telephone Encounter (Signed)
 Medication sent to pharmacy

## 2024-07-25 NOTE — Telephone Encounter (Signed)
 Shipment never picked up.   Moved to sample stock.

## 2024-07-25 NOTE — Telephone Encounter (Signed)
 Previous shipment never picked up. Moved to samples.  4 boxes Tresiba  U100  2 boxes Novolog  Flexpens

## 2024-08-08 ENCOUNTER — Other Ambulatory Visit: Payer: Self-pay | Admitting: Internal Medicine

## 2024-08-08 ENCOUNTER — Other Ambulatory Visit (HOSPITAL_COMMUNITY): Payer: Self-pay

## 2024-08-08 ENCOUNTER — Encounter (HOSPITAL_COMMUNITY): Payer: Self-pay | Admitting: Nephrology

## 2024-08-08 ENCOUNTER — Other Ambulatory Visit: Payer: Self-pay

## 2024-08-08 DIAGNOSIS — I10 Essential (primary) hypertension: Secondary | ICD-10-CM

## 2024-08-08 MED ORDER — CARVEDILOL 12.5 MG PO TABS
12.5000 mg | ORAL_TABLET | Freq: Two times a day (BID) | ORAL | 6 refills | Status: AC
  Filled 2024-08-08: qty 60, 30d supply, fill #0

## 2024-08-08 NOTE — Telephone Encounter (Signed)
 Medication sent to pharmacy
